# Patient Record
Sex: Female | Born: 1965 | Race: White | Hispanic: No | Marital: Married | Smoking: Never smoker
Health system: Southern US, Community
[De-identification: ages and names within clinical notes are randomized; demographics above are authoritative.]

## PROBLEM LIST (undated history)

## (undated) ENCOUNTER — Ambulatory Visit (HOSPITAL_COMMUNITY): Admission: EM | Payer: BC Managed Care – PPO | Source: Home / Self Care

## (undated) DIAGNOSIS — F419 Anxiety disorder, unspecified: Secondary | ICD-10-CM

## (undated) DIAGNOSIS — M7542 Impingement syndrome of left shoulder: Secondary | ICD-10-CM

## (undated) DIAGNOSIS — K2970 Gastritis, unspecified, without bleeding: Secondary | ICD-10-CM

## (undated) DIAGNOSIS — IMO0002 Reserved for concepts with insufficient information to code with codable children: Secondary | ICD-10-CM

## (undated) DIAGNOSIS — M542 Cervicalgia: Secondary | ICD-10-CM

## (undated) DIAGNOSIS — L900 Lichen sclerosus et atrophicus: Secondary | ICD-10-CM

## (undated) DIAGNOSIS — T7421XA Adult sexual abuse, confirmed, initial encounter: Secondary | ICD-10-CM

## (undated) DIAGNOSIS — J45909 Unspecified asthma, uncomplicated: Secondary | ICD-10-CM

## (undated) DIAGNOSIS — Z8659 Personal history of other mental and behavioral disorders: Secondary | ICD-10-CM

## (undated) DIAGNOSIS — G8929 Other chronic pain: Secondary | ICD-10-CM

## (undated) DIAGNOSIS — R87619 Unspecified abnormal cytological findings in specimens from cervix uteri: Secondary | ICD-10-CM

## (undated) DIAGNOSIS — K589 Irritable bowel syndrome without diarrhea: Secondary | ICD-10-CM

## (undated) DIAGNOSIS — M25522 Pain in left elbow: Secondary | ICD-10-CM

## (undated) DIAGNOSIS — N3010 Interstitial cystitis (chronic) without hematuria: Secondary | ICD-10-CM

## (undated) DIAGNOSIS — S42309A Unspecified fracture of shaft of humerus, unspecified arm, initial encounter for closed fracture: Secondary | ICD-10-CM

## (undated) DIAGNOSIS — F319 Bipolar disorder, unspecified: Secondary | ICD-10-CM

## (undated) DIAGNOSIS — G35 Multiple sclerosis: Secondary | ICD-10-CM

## (undated) DIAGNOSIS — F4310 Post-traumatic stress disorder, unspecified: Secondary | ICD-10-CM

## (undated) DIAGNOSIS — B009 Herpesviral infection, unspecified: Secondary | ICD-10-CM

## (undated) DIAGNOSIS — Z9152 Personal history of nonsuicidal self-harm: Secondary | ICD-10-CM

## (undated) DIAGNOSIS — A630 Anogenital (venereal) warts: Secondary | ICD-10-CM

## (undated) DIAGNOSIS — G2581 Restless legs syndrome: Secondary | ICD-10-CM

## (undated) DIAGNOSIS — M797 Fibromyalgia: Secondary | ICD-10-CM

## (undated) DIAGNOSIS — M5442 Lumbago with sciatica, left side: Secondary | ICD-10-CM

## (undated) HISTORY — DX: Multiple sclerosis: G35

## (undated) HISTORY — DX: Unspecified asthma, uncomplicated: J45.909

## (undated) HISTORY — DX: Anogenital (venereal) warts: A63.0

## (undated) HISTORY — DX: Gastritis, unspecified, without bleeding: K29.70

## (undated) HISTORY — DX: Lumbago with sciatica, left side: M54.42

## (undated) HISTORY — PX: ENDOMETRIAL ABLATION: SHX621

## (undated) HISTORY — DX: Other chronic pain: G89.29

## (undated) HISTORY — DX: Irritable bowel syndrome, unspecified: K58.9

## (undated) HISTORY — DX: Restless legs syndrome: G25.81

## (undated) HISTORY — DX: Lichen sclerosus et atrophicus: L90.0

## (undated) HISTORY — DX: Post-traumatic stress disorder, unspecified: F43.10

## (undated) HISTORY — PX: NASAL SINUS SURGERY: SHX719

## (undated) HISTORY — DX: Impingement syndrome of left shoulder: M75.42

## (undated) HISTORY — DX: Unspecified fracture of shaft of humerus, unspecified arm, initial encounter for closed fracture: S42.309A

## (undated) HISTORY — DX: Adult sexual abuse, confirmed, initial encounter: T74.21XA

## (undated) HISTORY — DX: Personal history of nonsuicidal self-harm: Z91.52

## (undated) HISTORY — DX: Pain in left elbow: M25.522

## (undated) HISTORY — DX: Bipolar disorder, unspecified: F31.9

---
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
## 1987-05-17 DIAGNOSIS — B009 Herpesviral infection, unspecified: Secondary | ICD-10-CM

## 1987-05-17 HISTORY — DX: Herpesviral infection, unspecified: B00.9

## 1988-05-16 HISTORY — PX: BREAST ENHANCEMENT SURGERY: SHX7

## 1995-05-17 DIAGNOSIS — R87619 Unspecified abnormal cytological findings in specimens from cervix uteri: Secondary | ICD-10-CM

## 1995-05-17 HISTORY — DX: Unspecified abnormal cytological findings in specimens from cervix uteri: R87.619

## 1995-05-17 HISTORY — PX: CERVIX LESION DESTRUCTION: SHX591

## 1997-11-11 ENCOUNTER — Other Ambulatory Visit: Admission: RE | Admit: 1997-11-11 | Discharge: 1997-11-11 | Payer: Self-pay | Admitting: Obstetrics and Gynecology

## 1998-05-06 ENCOUNTER — Ambulatory Visit (HOSPITAL_COMMUNITY): Admission: RE | Admit: 1998-05-06 | Discharge: 1998-05-06 | Payer: Self-pay | Admitting: Gynecology

## 1999-01-26 ENCOUNTER — Ambulatory Visit (HOSPITAL_COMMUNITY): Admission: RE | Admit: 1999-01-26 | Discharge: 1999-01-26 | Payer: Self-pay | Admitting: Gastroenterology

## 1999-07-07 ENCOUNTER — Encounter: Payer: Self-pay | Admitting: Rheumatology

## 1999-07-07 ENCOUNTER — Encounter: Admission: RE | Admit: 1999-07-07 | Discharge: 1999-07-07 | Payer: Self-pay | Admitting: Rheumatology

## 1999-07-28 ENCOUNTER — Other Ambulatory Visit: Admission: RE | Admit: 1999-07-28 | Discharge: 1999-07-28 | Payer: Self-pay | Admitting: Gynecology

## 2000-03-01 ENCOUNTER — Encounter (INDEPENDENT_AMBULATORY_CARE_PROVIDER_SITE_OTHER): Payer: Self-pay | Admitting: Specialist

## 2000-03-01 ENCOUNTER — Other Ambulatory Visit: Admission: RE | Admit: 2000-03-01 | Discharge: 2000-03-01 | Payer: Self-pay | Admitting: *Deleted

## 2000-07-20 ENCOUNTER — Other Ambulatory Visit: Admission: RE | Admit: 2000-07-20 | Discharge: 2000-07-20 | Payer: Self-pay | Admitting: Gynecology

## 2001-03-23 ENCOUNTER — Ambulatory Visit (HOSPITAL_COMMUNITY): Admission: RE | Admit: 2001-03-23 | Discharge: 2001-03-23 | Payer: Self-pay | Admitting: Gastroenterology

## 2001-10-18 ENCOUNTER — Other Ambulatory Visit: Admission: RE | Admit: 2001-10-18 | Discharge: 2001-10-18 | Payer: Self-pay | Admitting: Gynecology

## 2002-07-01 ENCOUNTER — Encounter: Payer: Self-pay | Admitting: Psychiatry

## 2002-07-01 ENCOUNTER — Ambulatory Visit (HOSPITAL_COMMUNITY): Admission: RE | Admit: 2002-07-01 | Discharge: 2002-07-01 | Payer: Self-pay | Admitting: Psychiatry

## 2002-10-30 ENCOUNTER — Inpatient Hospital Stay (HOSPITAL_COMMUNITY): Admission: EM | Admit: 2002-10-30 | Discharge: 2002-11-04 | Payer: Self-pay | Admitting: Psychiatry

## 2003-01-06 ENCOUNTER — Other Ambulatory Visit (HOSPITAL_COMMUNITY): Admission: RE | Admit: 2003-01-06 | Discharge: 2003-01-14 | Payer: Self-pay | Admitting: Psychiatry

## 2003-10-20 ENCOUNTER — Other Ambulatory Visit: Admission: RE | Admit: 2003-10-20 | Discharge: 2003-10-20 | Payer: Self-pay | Admitting: Gynecology

## 2004-07-19 ENCOUNTER — Ambulatory Visit (HOSPITAL_COMMUNITY): Admission: RE | Admit: 2004-07-19 | Discharge: 2004-07-19 | Payer: Self-pay | Admitting: Neurology

## 2004-12-02 ENCOUNTER — Emergency Department (HOSPITAL_COMMUNITY): Admission: EM | Admit: 2004-12-02 | Discharge: 2004-12-02 | Payer: Self-pay | Admitting: Family Medicine

## 2005-02-07 ENCOUNTER — Other Ambulatory Visit: Admission: RE | Admit: 2005-02-07 | Discharge: 2005-02-07 | Payer: Self-pay | Admitting: Obstetrics and Gynecology

## 2005-04-21 ENCOUNTER — Ambulatory Visit: Payer: Self-pay | Admitting: Psychiatry

## 2005-04-21 ENCOUNTER — Other Ambulatory Visit (HOSPITAL_COMMUNITY): Admission: RE | Admit: 2005-04-21 | Discharge: 2005-07-20 | Payer: Self-pay | Admitting: Psychiatry

## 2005-06-23 ENCOUNTER — Ambulatory Visit (HOSPITAL_COMMUNITY): Admission: RE | Admit: 2005-06-23 | Discharge: 2005-06-23 | Payer: Self-pay | Admitting: *Deleted

## 2005-06-23 ENCOUNTER — Encounter (INDEPENDENT_AMBULATORY_CARE_PROVIDER_SITE_OTHER): Payer: Self-pay | Admitting: Specialist

## 2005-07-05 ENCOUNTER — Ambulatory Visit (HOSPITAL_BASED_OUTPATIENT_CLINIC_OR_DEPARTMENT_OTHER): Admission: RE | Admit: 2005-07-05 | Discharge: 2005-07-05 | Payer: Self-pay | Admitting: Urology

## 2005-11-29 ENCOUNTER — Ambulatory Visit (HOSPITAL_COMMUNITY): Admission: RE | Admit: 2005-11-29 | Discharge: 2005-11-29 | Payer: Self-pay | Admitting: *Deleted

## 2005-12-01 ENCOUNTER — Emergency Department (HOSPITAL_COMMUNITY): Admission: EM | Admit: 2005-12-01 | Discharge: 2005-12-02 | Payer: Self-pay | Admitting: Emergency Medicine

## 2006-02-09 ENCOUNTER — Other Ambulatory Visit: Admission: RE | Admit: 2006-02-09 | Discharge: 2006-02-09 | Payer: Self-pay | Admitting: Obstetrics & Gynecology

## 2006-02-20 ENCOUNTER — Emergency Department (HOSPITAL_COMMUNITY): Admission: EM | Admit: 2006-02-20 | Discharge: 2006-02-20 | Payer: Self-pay | Admitting: Emergency Medicine

## 2006-09-20 ENCOUNTER — Encounter: Admission: RE | Admit: 2006-09-20 | Discharge: 2006-09-20 | Payer: Self-pay | Admitting: Obstetrics and Gynecology

## 2007-02-26 ENCOUNTER — Emergency Department (HOSPITAL_COMMUNITY): Admission: EM | Admit: 2007-02-26 | Discharge: 2007-02-26 | Payer: Self-pay | Admitting: Family Medicine

## 2007-03-22 ENCOUNTER — Other Ambulatory Visit: Admission: RE | Admit: 2007-03-22 | Discharge: 2007-03-22 | Payer: Self-pay | Admitting: Obstetrics and Gynecology

## 2007-05-08 ENCOUNTER — Other Ambulatory Visit: Admission: RE | Admit: 2007-05-08 | Discharge: 2007-05-08 | Payer: Self-pay | Admitting: Obstetrics and Gynecology

## 2007-07-01 ENCOUNTER — Emergency Department (HOSPITAL_COMMUNITY): Admission: EM | Admit: 2007-07-01 | Discharge: 2007-07-01 | Payer: Self-pay | Admitting: Emergency Medicine

## 2007-08-28 ENCOUNTER — Emergency Department (HOSPITAL_COMMUNITY): Admission: EM | Admit: 2007-08-28 | Discharge: 2007-08-29 | Payer: Self-pay | Admitting: Emergency Medicine

## 2007-09-24 ENCOUNTER — Encounter: Admission: RE | Admit: 2007-09-24 | Discharge: 2007-09-24 | Payer: Self-pay | Admitting: Obstetrics & Gynecology

## 2007-11-22 ENCOUNTER — Encounter: Admission: RE | Admit: 2007-11-22 | Discharge: 2007-11-22 | Payer: Self-pay | Admitting: Obstetrics and Gynecology

## 2008-01-25 ENCOUNTER — Telehealth: Payer: Self-pay | Admitting: Gastroenterology

## 2008-05-12 ENCOUNTER — Other Ambulatory Visit: Admission: RE | Admit: 2008-05-12 | Discharge: 2008-05-12 | Payer: Self-pay | Admitting: Obstetrics and Gynecology

## 2008-09-02 ENCOUNTER — Other Ambulatory Visit: Admission: RE | Admit: 2008-09-02 | Discharge: 2008-09-02 | Payer: Self-pay | Admitting: Obstetrics and Gynecology

## 2009-01-13 ENCOUNTER — Encounter: Admission: RE | Admit: 2009-01-13 | Discharge: 2009-01-13 | Payer: Self-pay | Admitting: Obstetrics & Gynecology

## 2009-04-02 ENCOUNTER — Emergency Department (HOSPITAL_COMMUNITY): Admission: EM | Admit: 2009-04-02 | Discharge: 2009-04-02 | Payer: Self-pay | Admitting: Emergency Medicine

## 2009-04-14 ENCOUNTER — Emergency Department (HOSPITAL_COMMUNITY): Admission: EM | Admit: 2009-04-14 | Discharge: 2009-04-14 | Payer: Self-pay | Admitting: Emergency Medicine

## 2009-09-13 DIAGNOSIS — IMO0002 Reserved for concepts with insufficient information to code with codable children: Secondary | ICD-10-CM

## 2009-09-13 HISTORY — DX: Reserved for concepts with insufficient information to code with codable children: IMO0002

## 2010-01-15 ENCOUNTER — Encounter: Admission: RE | Admit: 2010-01-15 | Discharge: 2010-01-15 | Payer: Self-pay | Admitting: Obstetrics & Gynecology

## 2010-02-05 ENCOUNTER — Emergency Department (HOSPITAL_COMMUNITY): Admission: EM | Admit: 2010-02-05 | Discharge: 2010-02-05 | Payer: Self-pay | Admitting: Emergency Medicine

## 2010-05-26 ENCOUNTER — Ambulatory Visit
Admission: RE | Admit: 2010-05-26 | Discharge: 2010-05-26 | Payer: Self-pay | Source: Home / Self Care | Attending: Urology | Admitting: Urology

## 2010-05-31 LAB — POCT HEMOGLOBIN-HEMACUE: Hemoglobin: 13.4 g/dL (ref 12.0–15.0)

## 2010-07-29 LAB — URINALYSIS, ROUTINE W REFLEX MICROSCOPIC
Bilirubin Urine: NEGATIVE
Glucose, UA: NEGATIVE mg/dL
Hgb urine dipstick: NEGATIVE
Ketones, ur: NEGATIVE mg/dL
Nitrite: NEGATIVE
Protein, ur: NEGATIVE mg/dL
Specific Gravity, Urine: 1.002 — ABNORMAL LOW (ref 1.005–1.030)
Urobilinogen, UA: 0.2 mg/dL (ref 0.0–1.0)
pH: 7 (ref 5.0–8.0)

## 2010-07-29 LAB — POCT CARDIAC MARKERS
CKMB, poc: <1 ng/mL — ABNORMAL LOW (ref 1.0–8.0)
Myoglobin, poc: 39.6 ng/mL (ref 12–200)
Troponin i, poc: <0.05 ng/mL (ref 0.00–0.09)

## 2010-07-29 LAB — POCT I-STAT, CHEM 8
BUN: <3 mg/dL — ABNORMAL LOW (ref 6–23)
Calcium, Ion: 1.18 mmol/L (ref 1.12–1.32)
Chloride: 101 mEq/L (ref 96–112)
Creatinine, Ser: 0.7 mg/dL (ref 0.4–1.2)
Glucose, Bld: 92 mg/dL (ref 70–99)
HCT: 42 % (ref 36.0–46.0)
Hemoglobin: 14.3 g/dL (ref 12.0–15.0)
Potassium: 3.4 meq/L — ABNORMAL LOW (ref 3.5–5.1)
Sodium: 140 meq/L (ref 135–145)
TCO2: 27 mmol/L (ref 0–100)

## 2010-08-18 LAB — URINE MICROSCOPIC-ADD ON

## 2010-08-18 LAB — LIPASE, BLOOD: Lipase: 29 U/L (ref 11–59)

## 2010-08-18 LAB — COMPREHENSIVE METABOLIC PANEL
ALT: 17 U/L (ref 0–35)
Albumin: 3.8 g/dL (ref 3.5–5.2)
Alkaline Phosphatase: 49 U/L (ref 39–117)
BUN: 10 mg/dL (ref 6–23)
CO2: 31 mEq/L (ref 19–32)
Calcium: 9.3 mg/dL (ref 8.4–10.5)
Creatinine, Ser: 0.63 mg/dL (ref 0.4–1.2)
GFR calc Af Amer: >60 mL/min (ref >60)
GFR calc non Af Amer: >60 mL/min (ref >60)
Glucose, Bld: 90 mg/dL (ref 70–99)
Potassium: 4.3 mEq/L (ref 3.5–5.1)
Sodium: 137 mEq/L (ref 135–145)
Total Protein: 6.1 g/dL (ref 6.0–8.3)
Total Protein: 6.8 g/dL (ref 6.0–8.3)

## 2010-08-18 LAB — URINALYSIS, ROUTINE W REFLEX MICROSCOPIC
Nitrite: NEGATIVE
Specific Gravity, Urine: 1.028 (ref 1.005–1.030)
pH: 6.5 (ref 5.0–8.0)

## 2010-08-18 LAB — POCT CARDIAC MARKERS: Troponin i, poc: <0.05 ng/mL (ref 0.00–0.09)

## 2010-08-18 LAB — CBC
HCT: 39.1 % (ref 36.0–46.0)
Hemoglobin: 13.4 g/dL (ref 12.0–15.0)
MCHC: 34.1 g/dL (ref 30.0–36.0)
MCV: 92.7 fL (ref 78.0–100.0)
Platelets: 155 10*3/uL (ref 150–400)
RBC: 4.22 MIL/uL (ref 3.87–5.11)
RDW: 12.8 % (ref 11.5–15.5)
RDW: 13 % (ref 11.5–15.5)

## 2010-08-18 LAB — POCT I-STAT, CHEM 8
Calcium, Ion: 1.08 mmol/L — ABNORMAL LOW (ref 1.12–1.32)
Chloride: 96 mEq/L (ref 96–112)
Creatinine, Ser: 0.7 mg/dL (ref 0.4–1.2)
Glucose, Bld: 84 mg/dL (ref 70–99)
HCT: 40 % (ref 36.0–46.0)

## 2010-08-18 LAB — URINE CULTURE: Culture: NO GROWTH

## 2010-08-18 LAB — DIFFERENTIAL
Basophils Relative: 0 % (ref 0–1)
Eosinophils Absolute: 0 10*3/uL (ref 0.0–0.7)
Lymphs Abs: 1.7 10*3/uL (ref 0.7–4.0)
Monocytes Absolute: 0.7 10*3/uL (ref 0.1–1.0)
Monocytes Relative: 5 % (ref 3–12)
Monocytes Relative: 6 % (ref 3–12)
Neutro Abs: 12.5 10*3/uL — ABNORMAL HIGH (ref 1.7–7.7)
Neutro Abs: 4 10*3/uL (ref 1.7–7.7)
Neutrophils Relative %: 65 % (ref 43–77)

## 2010-08-18 LAB — PROTIME-INR
INR: 0.89 (ref 0.00–1.49)
Prothrombin Time: 12 seconds (ref 11.6–15.2)

## 2010-08-18 LAB — GLUCOSE, CAPILLARY: Glucose-Capillary: 70 mg/dL (ref 70–99)

## 2010-09-21 ENCOUNTER — Emergency Department (HOSPITAL_COMMUNITY)
Admission: EM | Admit: 2010-09-21 | Discharge: 2010-09-21 | Disposition: A | Discharge status: Home / Self Care | Payer: BC Managed Care – PPO | Attending: Emergency Medicine | Admitting: Emergency Medicine

## 2010-09-21 DIAGNOSIS — S81009A Unspecified open wound, unspecified knee, initial encounter: Secondary | ICD-10-CM | POA: Insufficient documentation

## 2010-09-21 DIAGNOSIS — F341 Dysthymic disorder: Secondary | ICD-10-CM | POA: Insufficient documentation

## 2010-09-21 DIAGNOSIS — S91009A Unspecified open wound, unspecified ankle, initial encounter: Secondary | ICD-10-CM | POA: Insufficient documentation

## 2010-09-21 DIAGNOSIS — X789XXA Intentional self-harm by unspecified sharp object, initial encounter: Secondary | ICD-10-CM | POA: Insufficient documentation

## 2010-09-21 DIAGNOSIS — G35 Multiple sclerosis: Secondary | ICD-10-CM | POA: Insufficient documentation

## 2010-10-01 NOTE — Op Note (Signed)
NAME:  Erika Mccoy, Erika Mccoy NO.:  [id number]   MEDICAL RECORD NO.:  [id number]          PATIENT TYPE:  AMB   LOCATION:  ENDO                         FACILITY:  [facility]   PHYSICIAN:  Georgiana Spinner, M.D.    DATE OF BIRTH:  [birth year]   DATE OF PROCEDURE:  06/23/2005  DATE OF DISCHARGE:                                 OPERATIVE REPORT   PROCEDURE:  Colonoscopy.   INDICATIONS:  Diarrhea. Colon cancer screening.  Hemoccult positivity   PROCEDURE:  With the patient mildly sedated in the left lateral decubitus  position, a rectal examination was performed which revealed trace positive  material. Subsequently the Olympus videoscopic colonoscope was inserted in  the rectum, passed under direct vision to the cecum identified by ileocecal  valve and appendiceal orifice both of which were photographed. We entered  into the terminal ileum which was also photographed. From this point  colonoscope was slowly withdrawn taking circumferential views of colonic  mucosa stopping first in the terminal ileum and subsequently randomly in the  colon to take biopsies until we reached the rectum which appeared normal on  direct and retroflexed view.  The endoscope was straightened and withdrawn.  The patient's vital signs, pulse oximeter remained stable. The patient  tolerated procedure well without apparent complication.   FINDINGS:  Negative examination. Await biopsy report. The patient will call  me for results and follow-up with me as an outpatient.           ______________________________  Georgiana Spinner, M.D.     GMO/MEDQ  D:  06/23/2005  T:  06/23/2005  Job:  [id number]

## 2010-10-01 NOTE — Op Note (Signed)
NAME:  LANA, FLAIM NO.:  [id number]   MEDICAL RECORD NO.:  [id number]          PATIENT TYPE:  AMB   LOCATION:  ENDO                         FACILITY:  [facility]   PHYSICIAN:  Georgiana Spinner, M.D.    DATE OF BIRTH:  01/25/1966   DATE OF PROCEDURE:  06/23/2005  DATE OF DISCHARGE:                                 OPERATIVE REPORT   PROCEDURE:  Upper endoscopy with biopsy.   INDICATIONS:  Diarrhea, abdominal discomfort.   ANESTHESIA:  Demerol 125 mg, Robinul 0.2 mg, Phenergan 25 mg and Versed 12.5  mg.   PROCEDURE:  With the patient mildly sedated in the left lateral decubitus  position, the Olympus videoscopic endoscope was inserted in the mouth and  passed under direct vision through the esophagus which appeared normal into  the stomach.  Fundus, body, antrum appeared normal.  We passed the duodenal  bulb, second portion duodenum and distally which was biopsied.  From this  point, the endoscope was slowly withdrawn taking circumferential views of  duodenal mucosa until the endoscope had been pulled back in the stomach,  placed in retroflexion to view the stomach from below.  The endoscope was  straightened and withdrawn taking circumferential views of the remaining  gastric and esophageal mucosa. The patient's vital signs and pulse remained  stable. The patient tolerated procedure well without apparent complications.   FINDINGS:  Rather unremarkable examination with biopsies taken. Await biopsy  report. The patient will call me for results and follow-up with me as an  outpatient.  Proceed to colonoscopy as planned.           ______________________________  Georgiana Spinner, M.D.     GMO/MEDQ  D:  06/23/2005  T:  06/23/2005  Job:  [id number]

## 2010-10-01 NOTE — Op Note (Signed)
NAME:  Erika Mccoy, DEDIC NO.:  [id number]   MEDICAL RECORD NO.:  [id number]          PATIENT TYPE:  AMB   LOCATION:  ENDO                         FACILITY:  [facility]   PHYSICIAN:  Georgiana Spinner, M.D.    DATE OF BIRTH:  21-Oct-1965   DATE OF PROCEDURE:  11/29/2005  DATE OF DISCHARGE:                                 OPERATIVE REPORT   PROCEDURE:  Colonoscopy.   INDICATIONS:  Rectal bleeding.  The patient with question of colitis.   ANESTHESIA:  Fentanyl 125 mcg, Versed 12 mg, Phenergan 25 mg.   PROCEDURE:  With patient mildly sedated in the left lateral decubitus  position, the Olympus videoscopic colonoscope was inserted into the rectum  and passed under direct vision.  With pressure applied to the abdomen, we  reached the cecum identified by ileocecal valve and appendiceal orifice,  both of which were photographed.  From this point, the colonoscope was  slowly withdrawn taking circumferential views of colonic mucosa stopping  only in the rectum which appeared normal in direct view.  I attempted  retroflexed view, but it was uncomfortable for the patient, and since we had  done her previously recently, I decided not to proceed.  The patient's vital  signs and pulse oximeter remained stable.  The patient tolerated the  procedure well and without apparent complication.   FINDINGS:  Unremarkable examination.   PLAN:  Have patient follow-up with me as an outpatient.           ______________________________  Georgiana Spinner, M.D.     GMO/MEDQ  D:  11/29/2005  T:  11/29/2005  Job:  [id number]

## 2010-10-01 NOTE — Op Note (Signed)
NAME:  YURITZI, KAMP            ACCOUNT NO.:  [id number]   MEDICAL RECORD NO.:  [id number]          PATIENT TYPE:  AMB   LOCATION:  [facility]                         FACILITY:  [facility]   PHYSICIAN:  Martina Sinner, MD DATE OF BIRTH:  January 10, 1966   DATE OF PROCEDURE:  07/05/2005  DATE OF DISCHARGE:                                 OPERATIVE REPORT   PREOPERATIVE DIAGNOSIS:  Pelvic pain.   POSTOPERATIVE DIAGNOSES:  Pelvic pain: interstitial cystitis.   PROCEDURE:  Cystoscopy, bladder hydrodistention and bladder installation  therapy.   Loreli Debruler has chronic pelvic pain as described in her history and  physical. She has multiple allergies. She had some nonspecific  gastroenterology complaints. She had been on Elmiron for 6 months.   The patient was prepped and draped in the usual fashion. A 21 a [place] [facility]  cystoscope we used for the examination. The initial inspection of the  bladder mucosa and trigone were normal. She was hydrodistended 800 mL. There  was no stitch, foreign body or carcinoma. I emptied her bladder and  reinspected and she had diffuse glomerulations throughout her entire  bladder, especially at 5 and 7 o'clock. There was quite a bit of oozing of  blood and this settled down with some gentle irrigation through the  cystoscope sheath. There was no Hunner's ulcer. There was no bladder injury  or bladder perforation.   I then instilled 15 mL of 0.5% Marcaine in combination with 400 mg of  Pyridium. A B&O suppository was also inserted. The patient will be followed  as per protocol. Because of her multiple allergies, I did not give her any  more antibiotics for home use.           ______________________________  Martina Sinner, MD  Electronically Signed     SAM/MEDQ  D:  07/05/2005  T:  07/06/2005  Job:  [id number]

## 2010-11-11 ENCOUNTER — Emergency Department (HOSPITAL_COMMUNITY)
Admission: EM | Admit: 2010-11-11 | Discharge: 2010-11-11 | Disposition: A | Discharge status: Home / Self Care | Payer: BC Managed Care – PPO | Attending: Emergency Medicine | Admitting: Emergency Medicine

## 2010-11-11 ENCOUNTER — Emergency Department (HOSPITAL_COMMUNITY)
Admission: EM | Admit: 2010-11-11 | Discharge: 2010-11-11 | Disposition: A | Discharge status: Home / Self Care | Payer: BC Managed Care – PPO | Source: Home / Self Care | Attending: Emergency Medicine | Admitting: Emergency Medicine

## 2010-11-11 DIAGNOSIS — S81009A Unspecified open wound, unspecified knee, initial encounter: Secondary | ICD-10-CM | POA: Insufficient documentation

## 2010-11-11 DIAGNOSIS — S81809A Unspecified open wound, unspecified lower leg, initial encounter: Secondary | ICD-10-CM | POA: Insufficient documentation

## 2010-11-11 DIAGNOSIS — X789XXA Intentional self-harm by unspecified sharp object, initial encounter: Secondary | ICD-10-CM | POA: Insufficient documentation

## 2010-11-12 DIAGNOSIS — M542 Cervicalgia: Secondary | ICD-10-CM

## 2010-11-12 HISTORY — DX: Cervicalgia: M54.2

## 2010-11-18 ENCOUNTER — Ambulatory Visit
Admission: RE | Admit: 2010-11-18 | Discharge: 2010-11-18 | Disposition: A | Discharge status: Home / Self Care | Payer: BC Managed Care – PPO | Source: Ambulatory Visit | Attending: Orthopedic Surgery | Admitting: Orthopedic Surgery

## 2010-11-18 ENCOUNTER — Other Ambulatory Visit: Payer: Self-pay | Admitting: Orthopedic Surgery

## 2010-11-18 DIAGNOSIS — R52 Pain, unspecified: Secondary | ICD-10-CM

## 2010-12-09 ENCOUNTER — Other Ambulatory Visit: Payer: Self-pay | Admitting: Obstetrics & Gynecology

## 2010-12-09 DIAGNOSIS — F319 Bipolar disorder, unspecified: Secondary | ICD-10-CM | POA: Insufficient documentation

## 2010-12-09 DIAGNOSIS — M797 Fibromyalgia: Secondary | ICD-10-CM | POA: Insufficient documentation

## 2010-12-09 DIAGNOSIS — F411 Generalized anxiety disorder: Secondary | ICD-10-CM | POA: Insufficient documentation

## 2010-12-09 DIAGNOSIS — F449 Dissociative and conversion disorder, unspecified: Secondary | ICD-10-CM | POA: Insufficient documentation

## 2010-12-09 DIAGNOSIS — F4310 Post-traumatic stress disorder, unspecified: Secondary | ICD-10-CM | POA: Insufficient documentation

## 2010-12-09 DIAGNOSIS — Z1231 Encounter for screening mammogram for malignant neoplasm of breast: Secondary | ICD-10-CM

## 2010-12-21 DIAGNOSIS — L709 Acne, unspecified: Secondary | ICD-10-CM | POA: Insufficient documentation

## 2011-01-18 ENCOUNTER — Ambulatory Visit: Payer: BC Managed Care – PPO

## 2011-01-28 ENCOUNTER — Emergency Department (HOSPITAL_COMMUNITY)
Admission: EM | Admit: 2011-01-28 | Discharge: 2011-01-28 | Disposition: A | Discharge status: Home / Self Care | Payer: BC Managed Care – PPO | Attending: Emergency Medicine | Admitting: Emergency Medicine

## 2011-01-28 DIAGNOSIS — S81809A Unspecified open wound, unspecified lower leg, initial encounter: Secondary | ICD-10-CM | POA: Insufficient documentation

## 2011-01-28 DIAGNOSIS — X789XXA Intentional self-harm by unspecified sharp object, initial encounter: Secondary | ICD-10-CM | POA: Insufficient documentation

## 2011-01-28 DIAGNOSIS — S81009A Unspecified open wound, unspecified knee, initial encounter: Secondary | ICD-10-CM | POA: Insufficient documentation

## 2011-01-31 ENCOUNTER — Emergency Department (HOSPITAL_COMMUNITY)
Admission: EM | Admit: 2011-01-31 | Discharge: 2011-01-31 | Discharge status: Against Medical Advice | Payer: BC Managed Care – PPO | Attending: Emergency Medicine | Admitting: Emergency Medicine

## 2011-02-04 LAB — URINE MICROSCOPIC-ADD ON

## 2011-02-04 LAB — URINALYSIS, ROUTINE W REFLEX MICROSCOPIC
Glucose, UA: NEGATIVE
Hgb urine dipstick: NEGATIVE
Ketones, ur: NEGATIVE
Protein, ur: NEGATIVE
Urobilinogen, UA: 0.2

## 2011-02-23 ENCOUNTER — Ambulatory Visit
Admission: RE | Admit: 2011-02-23 | Discharge: 2011-02-23 | Disposition: A | Discharge status: Home / Self Care | Payer: BC Managed Care – PPO | Source: Ambulatory Visit | Attending: Obstetrics & Gynecology | Admitting: Obstetrics & Gynecology

## 2011-02-23 DIAGNOSIS — Z1231 Encounter for screening mammogram for malignant neoplasm of breast: Secondary | ICD-10-CM

## 2011-02-28 ENCOUNTER — Emergency Department (HOSPITAL_COMMUNITY)
Admission: EM | Admit: 2011-02-28 | Discharge: 2011-02-28 | Disposition: A | Discharge status: Home / Self Care | Payer: BC Managed Care – PPO | Attending: Emergency Medicine | Admitting: Emergency Medicine

## 2011-02-28 DIAGNOSIS — M25579 Pain in unspecified ankle and joints of unspecified foot: Secondary | ICD-10-CM | POA: Insufficient documentation

## 2011-02-28 DIAGNOSIS — M79609 Pain in unspecified limb: Secondary | ICD-10-CM | POA: Insufficient documentation

## 2011-03-12 ENCOUNTER — Emergency Department (HOSPITAL_COMMUNITY)
Admission: EM | Admit: 2011-03-12 | Discharge: 2011-03-13 | Disposition: A | Discharge status: Other Acute Inpatient Hospital | Payer: BC Managed Care – PPO | Attending: Emergency Medicine | Admitting: Emergency Medicine

## 2011-03-12 DIAGNOSIS — F3289 Other specified depressive episodes: Secondary | ICD-10-CM | POA: Insufficient documentation

## 2011-03-12 DIAGNOSIS — IMO0001 Reserved for inherently not codable concepts without codable children: Secondary | ICD-10-CM | POA: Insufficient documentation

## 2011-03-12 DIAGNOSIS — X789XXA Intentional self-harm by unspecified sharp object, initial encounter: Secondary | ICD-10-CM | POA: Insufficient documentation

## 2011-03-12 DIAGNOSIS — S50909A Unspecified superficial injury of unspecified elbow, initial encounter: Secondary | ICD-10-CM | POA: Insufficient documentation

## 2011-03-12 DIAGNOSIS — Z79899 Other long term (current) drug therapy: Secondary | ICD-10-CM | POA: Insufficient documentation

## 2011-03-12 DIAGNOSIS — F329 Major depressive disorder, single episode, unspecified: Secondary | ICD-10-CM | POA: Insufficient documentation

## 2011-03-12 DIAGNOSIS — R45851 Suicidal ideations: Secondary | ICD-10-CM | POA: Insufficient documentation

## 2011-03-12 LAB — COMPREHENSIVE METABOLIC PANEL
ALT: 12 U/L (ref 0–35)
AST: 15 U/L (ref 0–37)
CO2: 28 mEq/L (ref 19–32)
Calcium: 10 mg/dL (ref 8.4–10.5)
Chloride: 97 mEq/L (ref 96–112)
Creatinine, Ser: 0.69 mg/dL (ref 0.50–1.10)
GFR calc Af Amer: >90 mL/min (ref >90)
GFR calc non Af Amer: >90 mL/min (ref >90)
Glucose, Bld: 83 mg/dL (ref 70–99)
Total Bilirubin: 0.7 mg/dL (ref 0.3–1.2)

## 2011-03-12 LAB — DIFFERENTIAL
Eosinophils Absolute: 0.1 10*3/uL (ref 0.0–0.7)
Lymphocytes Relative: 42 % (ref 12–46)
Lymphs Abs: 2.4 10*3/uL (ref 0.7–4.0)
Monocytes Relative: 4 % (ref 3–12)
Neutrophils Relative %: 52 % (ref 43–77)

## 2011-03-12 LAB — CBC
HCT: 40.2 % (ref 36.0–46.0)
Hemoglobin: 13.8 g/dL (ref 12.0–15.0)
MCH: 30.2 pg (ref 26.0–34.0)
MCV: 88 fL (ref 78.0–100.0)
RBC: 4.57 MIL/uL (ref 3.87–5.11)
WBC: 5.7 10*3/uL (ref 4.0–10.5)

## 2011-03-12 LAB — ETHANOL: Alcohol, Ethyl (B): 121 mg/dL — ABNORMAL HIGH (ref 0–11)

## 2011-03-12 LAB — RAPID URINE DRUG SCREEN, HOSP PERFORMED
Amphetamines: NOT DETECTED
Benzodiazepines: NOT DETECTED
Opiates: NOT DETECTED

## 2011-03-13 ENCOUNTER — Inpatient Hospital Stay (HOSPITAL_COMMUNITY)
Admission: EM | Admit: 2011-03-13 | Discharge: 2011-03-15 | DRG: 430 | Disposition: A | Discharge status: Home / Self Care | Payer: BC Managed Care – PPO | Source: Other Acute Inpatient Hospital | Attending: Psychiatry | Admitting: Psychiatry

## 2011-03-13 DIAGNOSIS — S61509A Unspecified open wound of unspecified wrist, initial encounter: Secondary | ICD-10-CM

## 2011-03-13 DIAGNOSIS — Z79899 Other long term (current) drug therapy: Secondary | ICD-10-CM

## 2011-03-13 DIAGNOSIS — N3010 Interstitial cystitis (chronic) without hematuria: Secondary | ICD-10-CM

## 2011-03-13 DIAGNOSIS — X838XXA Intentional self-harm by other specified means, initial encounter: Secondary | ICD-10-CM

## 2011-03-13 DIAGNOSIS — F411 Generalized anxiety disorder: Secondary | ICD-10-CM

## 2011-03-13 DIAGNOSIS — F1010 Alcohol abuse, uncomplicated: Secondary | ICD-10-CM

## 2011-03-13 DIAGNOSIS — IMO0001 Reserved for inherently not codable concepts without codable children: Secondary | ICD-10-CM

## 2011-03-13 DIAGNOSIS — Z560 Unemployment, unspecified: Secondary | ICD-10-CM

## 2011-03-13 DIAGNOSIS — F339 Major depressive disorder, recurrent, unspecified: Principal | ICD-10-CM

## 2011-03-13 DIAGNOSIS — K589 Irritable bowel syndrome without diarrhea: Secondary | ICD-10-CM

## 2011-03-13 DIAGNOSIS — Z818 Family history of other mental and behavioral disorders: Secondary | ICD-10-CM

## 2011-03-13 LAB — HEPATIC FUNCTION PANEL
Bilirubin, Direct: 0.1 mg/dL (ref 0.0–0.3)
Total Bilirubin: 0.7 mg/dL (ref 0.3–1.2)

## 2011-03-14 DIAGNOSIS — F411 Generalized anxiety disorder: Secondary | ICD-10-CM

## 2011-03-14 DIAGNOSIS — F339 Major depressive disorder, recurrent, unspecified: Secondary | ICD-10-CM

## 2011-03-14 LAB — URINALYSIS, ROUTINE W REFLEX MICROSCOPIC
Ketones, ur: NEGATIVE mg/dL
Leukocytes, UA: NEGATIVE
Nitrite: NEGATIVE
Protein, ur: NEGATIVE mg/dL
Urobilinogen, UA: 0.2 mg/dL (ref 0.0–1.0)

## 2011-03-14 LAB — TSH: TSH: 3.76 u[IU]/mL (ref 0.350–4.500)
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
## 2011-03-17 NOTE — Discharge Summary (Signed)
  NAME:  Erika Mccoy, Erika Mccoy NO.:  [id number]  MEDICAL RECORD NO.:  [id number]  LOCATION:  0504                          FACILITY:  [facility]  PHYSICIAN:  Franchot Gallo, MD     DATE OF BIRTH:  10/21/1965  DATE OF ADMISSION:  03/14/2011 DATE OF DISCHARGE:  03/15/2011                              DISCHARGE SUMMARY   REASON FOR ADMISSION:  Patient presented with a history of depression and anxiety, getting very frustrated, having some problems with her twin sons.  She had a self-inflicted laceration and was having passive suicidal thoughts that if she did not get rid of this feeling, she did not know what would happen.  FINAL IMPRESSION:  Axis I:  Major depressive disorder, recurrent; generalized anxiety disorder. Axis II:  Deferred. Axis III:  History of fibromyalgia, irritable bowel syndrome, interstitial cystitis. Axis IV:  Issues with primary support system, marital separation. Axis V:  Global Assessment of Functioning at discharge is 70.  PERTINENT LABS:  Her urine drug screen was negative.  Alcohol level was at 121 on admission.  CMP was within normal limits.  The patient did have a superficial laceration to her left wrist.  SIGNIFICANT FINDINGS:  The patient was admitted to the adult milieu for safety and stabilization.  We continued with her home meds, but we did add Effexor to help with her depression and anxiety.  She was reporting she was beginning to feel better and on day of discharge, the patient was reporting fair sleep; good appetite; depression had resolved, rating it a 0 on a scale of 1-10; adamantly denying any suicidal or homicidal thoughts, auditory or visual hallucinations or delusional thinking.  Her anxiety was mild, rating it a 2 on a scale of 1-10, having no medication- related side effects.  Counselor did contact her mother who had no safety concerns, and we did provide information on suicide risk factors, prevention and  intervention.  DISCHARGE MEDICATIONS: 1. Klonopin 1 mg b.i.d. p.r.n. 2. Benadryl 25 mg q.h.s. 3. Perphenazine 4 mg taking 8 mg daily. 4. Effexor XR 75 mg 1 daily. 5. Lamictal 200 mg daily. 6. Trazodone 50 mg taking 4 tablets at bedtime.  FOLLOWUP APPOINTMENT:  With Dr. Donell Beers at [facility] at phone number [phone], and [name] at [facility] at phone number [phone].     Landry Corporal, N.P.   ______________________________ Franchot Gallo, MD    JO/MEDQ  D:  03/16/2011  T:  03/16/2011  Job:  [id number]  Electronically Signed by Limmie PatriciaP. on 03/17/2011 09:42:09 AM Electronically Signed by Franchot Gallo MD on 03/17/2011 03:55:39 PM

## 2011-03-30 DIAGNOSIS — K589 Irritable bowel syndrome without diarrhea: Secondary | ICD-10-CM | POA: Insufficient documentation

## 2011-03-30 DIAGNOSIS — K5909 Other constipation: Secondary | ICD-10-CM | POA: Insufficient documentation

## 2011-08-22 DIAGNOSIS — N895 Stricture and atresia of vagina: Secondary | ICD-10-CM | POA: Insufficient documentation

## 2011-09-28 DIAGNOSIS — J302 Other seasonal allergic rhinitis: Secondary | ICD-10-CM | POA: Insufficient documentation

## 2011-10-24 ENCOUNTER — Other Ambulatory Visit: Payer: Self-pay | Admitting: Family Medicine

## 2011-10-24 DIAGNOSIS — Z1231 Encounter for screening mammogram for malignant neoplasm of breast: Secondary | ICD-10-CM

## 2011-10-25 DIAGNOSIS — E781 Pure hyperglyceridemia: Secondary | ICD-10-CM | POA: Insufficient documentation

## 2012-04-22 ENCOUNTER — Encounter (HOSPITAL_COMMUNITY): Payer: Self-pay | Admitting: Emergency Medicine

## 2012-04-22 ENCOUNTER — Emergency Department (HOSPITAL_COMMUNITY)
Admission: EM | Admit: 2012-04-22 | Discharge: 2012-04-23 | Disposition: A | Discharge status: Home Health | Payer: BC Managed Care – PPO | Attending: Emergency Medicine | Admitting: Emergency Medicine

## 2012-04-22 DIAGNOSIS — Z3202 Encounter for pregnancy test, result negative: Secondary | ICD-10-CM | POA: Insufficient documentation

## 2012-04-22 DIAGNOSIS — F411 Generalized anxiety disorder: Secondary | ICD-10-CM | POA: Insufficient documentation

## 2012-04-22 DIAGNOSIS — IMO0001 Reserved for inherently not codable concepts without codable children: Secondary | ICD-10-CM | POA: Insufficient documentation

## 2012-04-22 DIAGNOSIS — R109 Unspecified abdominal pain: Secondary | ICD-10-CM | POA: Insufficient documentation

## 2012-04-22 DIAGNOSIS — N3010 Interstitial cystitis (chronic) without hematuria: Secondary | ICD-10-CM | POA: Insufficient documentation

## 2012-04-22 DIAGNOSIS — Z79899 Other long term (current) drug therapy: Secondary | ICD-10-CM | POA: Insufficient documentation

## 2012-04-22 DIAGNOSIS — L940 Localized scleroderma [morphea]: Secondary | ICD-10-CM | POA: Insufficient documentation

## 2012-04-22 DIAGNOSIS — N390 Urinary tract infection, site not specified: Secondary | ICD-10-CM

## 2012-04-22 HISTORY — DX: Fibromyalgia: M79.7

## 2012-04-22 HISTORY — DX: Interstitial cystitis (chronic) without hematuria: N30.10

## 2012-04-22 HISTORY — DX: Anxiety disorder, unspecified: F41.9

## 2012-04-22 LAB — URINALYSIS, ROUTINE W REFLEX MICROSCOPIC
Bilirubin Urine: NEGATIVE
Ketones, ur: NEGATIVE mg/dL
Nitrite: POSITIVE — AB
Protein, ur: NEGATIVE mg/dL
Urobilinogen, UA: 0.2 mg/dL (ref 0.0–1.0)

## 2012-04-22 LAB — URINE MICROSCOPIC-ADD ON

## 2012-04-22 LAB — POCT PREGNANCY, URINE: Preg Test, Ur: NEGATIVE

## 2012-04-22 MED ORDER — HYDROMORPHONE HCL PF 1 MG/ML IJ SOLN
1.0000 mg | Freq: Once | INTRAMUSCULAR | Status: AC
  Administered 2012-04-22: 1 mg via INTRAMUSCULAR
  Filled 2012-04-22: qty 1

## 2012-04-22 MED ORDER — ONDANSETRON 4 MG PO TBDP
8.0000 mg | ORAL_TABLET | Freq: Once | ORAL | Status: AC
  Administered 2012-04-22: 8 mg via ORAL
  Filled 2012-04-22: qty 2

## 2012-04-22 NOTE — ED Notes (Addendum)
meds given, pt up to b/r to give clean catch urine sample. Steady gait, alert, NAD, calm, interactive.

## 2012-04-22 NOTE — ED Notes (Signed)
Patient with interstitial cystitis, increasing in pain during the day.  Patient has been self cathing bladder, but pain has gotten out of control today.  Patient did take one vicodin with no relief.  Pain is in lower back, vaginal pain.

## 2012-04-22 NOTE — ED Provider Notes (Signed)
History     [name]: [id number]  Arrival date & time 04/22/12  2045   First MD Initiated Contact with Patient 04/22/12 2224      Chief Complaint  Patient presents with  . IC pain    HPI  History provided by the patient. Patient is a 46 year old female with history of anxiety, depression, fibromyalgia and interstitial cystitis who presents with complaints of uncontrolled pain from interstitial cystitis, "my IC pains". Patient states that she saw her urologist, Dr. Jacquelyne Balint on Friday for increasing lower pelvic and urethral burning and irritation this consistent with her IC. She was given a Marcaine injection in the urethra as well as in and out catheter treatments. Patient states she also has a prescription for vaginal suppository Valium to help with spasming. Patient was using this over the weekend as well as Vicodin which she occasionally uses for severe pains without any improvement. Patient states that she rarely has such severe exacerbations as she is having today. Patient states she is afraid to urinate and symptoms are significantly worse with any urination. She reports being tested for a UTI but Dr. Jacquelyne Balint at this time there was no signs of infection and she is not currently on any antibiotics. Patient denies any other complaints. She does denies any vaginal complaints currently. Denies any bleeding or vaginal discharge. She denies any fever, chills or sweats. She has slight nausea but no change in appetite. No episodes of vomiting. No diarrhea.    Past Medical History  Diagnosis Date  . Interstitial cystitis   . Fibromyalgia   . Anxiety   . Lichen sclerosus of female genitalia     History reviewed. No pertinent past surgical history.  History reviewed. No pertinent family history.  History  Substance Use Topics  . Smoking status: Never Smoker   . Smokeless tobacco: Not on file  . Alcohol Use: No    OB History    Grav Para Term Preterm Abortions TAB SAB Ect Mult Living                   Review of Systems  Constitutional: Negative for fever, chills and diaphoresis.  Gastrointestinal: Positive for nausea and abdominal pain. Negative for vomiting, diarrhea and constipation.  Genitourinary: Positive for dysuria and pelvic pain. Negative for frequency, hematuria, flank pain, vaginal bleeding and vaginal discharge.  All other systems reviewed and are negative.    Allergies  Cefzil; Ciprofloxacin; Levofloxacin; Neosporin; Quinolones; and Septra  Home Medications   Current Outpatient Rx  Name  Route  Sig  Dispense  Refill  . BUPIVACAINE HCL 0.5 % IJ SOLN   Infiltration   30 mLs by Infiltration route 2 (two) times daily.         . CHOLECALCIFEROL 1000 UNITS PO TABS   Oral   Take 1,000 Units by mouth daily.         Marland Kitchen CLOBETASOL PROPIONATE 0.05 % EX OINT   Topical   Apply 1 application topically 2 (two) times daily.         Marland Kitchen CLONAZEPAM 2 MG PO TABS   Oral   Take 1-2 mg by mouth 2 (two) times daily. 2mg  in the morning and 1mg  at night         . HYDROCODONE-ACETAMINOPHEN 5-325 MG PO TABS   Oral   Take 1 tablet by mouth every 6 (six) hours as needed. For pain         . MOMETASONE FUROATE 50 MCG/ACT NA SUSP  Nasal   Place 2 sprays into the nose daily.         Marland Kitchen MONTELUKAST SODIUM 10 MG PO TABS   Oral   Take 10 mg by mouth at bedtime.         . OMEPRAZOLE 20 MG PO CPDR   Oral   Take 20 mg by mouth daily.         Marland Kitchen PRESCRIPTION MEDICATION   Vaginal   Place 1 suppository vaginally at bedtime. Diazepam Vaginal Suppository         . TRAZODONE HCL 300 MG PO TABS   Oral   Take 300 mg by mouth at bedtime.         . VENLAFAXINE HCL ER 75 MG PO CP24   Oral   Take 75 mg by mouth daily.           BP 111/86  Pulse 98  Temp 97.9 F (36.6 C) (Oral)  Resp 17  SpO2 97%  LMP 03/23/2012  Physical Exam  Nursing note and vitals reviewed. Constitutional: She is oriented to person, place, and time. She appears  well-developed and well-nourished. No distress.  HENT:  Head: Normocephalic.  Cardiovascular: Normal rate and regular rhythm.   Pulmonary/Chest: Effort normal and breath sounds normal. No respiratory distress. She has no wheezes. She has no rales.  Abdominal: Soft. There is no hepatosplenomegaly. There is tenderness in the suprapubic area. There is no rebound, no guarding, no CVA tenderness, no tenderness at McBurney's point and negative Murphy's sign.  Genitourinary:       Exam was refused by patient  Neurological: She is alert and oriented to person, place, and time.  Skin: Skin is warm and dry.  Psychiatric: She has a normal mood and affect. Her behavior is normal.    ED Course  Procedures   Results for orders placed during the hospital encounter of 04/22/12  URINALYSIS, ROUTINE W REFLEX MICROSCOPIC      Component Value Range   Color, Urine YELLOW  YELLOW   APPearance CLOUDY (*) CLEAR   Specific Gravity, Urine 1.024  1.005 - 1.030   pH 6.0  5.0 - 8.0   Glucose, UA NEGATIVE  NEGATIVE mg/dL   Hgb urine dipstick SMALL (*) NEGATIVE   Bilirubin Urine NEGATIVE  NEGATIVE   Ketones, ur NEGATIVE  NEGATIVE mg/dL   Protein, ur NEGATIVE  NEGATIVE mg/dL   Urobilinogen, UA 0.2  0.0 - 1.0 mg/dL   Nitrite POSITIVE (*) NEGATIVE   Leukocytes, UA LARGE (*) NEGATIVE  POCT PREGNANCY, URINE      Component Value Range   Preg Test, Ur NEGATIVE  NEGATIVE  URINE MICROSCOPIC-ADD ON      Component Value Range   Squamous Epithelial / LPF FEW (*) RARE   WBC, UA TOO NUMEROUS TO COUNT  <3 WBC/hpf   RBC / HPF 7-10  <3 RBC/hpf   Bacteria, UA MANY (*) RARE        1. UTI (lower urinary tract infection)       MDM  10:25 PM patient seen and evaluated. Patient appears in mild discomfort but no significant or acute distress.  Patient denies having any vaginal symptoms. She refuses a pelvic exam. States she was seen by her OB/GYN in the past few months. She denies being sexually active  currently.    Patient feeling much better after pain medications. UA was signs of UTI. At this time we'll give pressures for Macrobid. Patient feels ready to be discharged home.  Angus Seller, PA 04/23/12 0020

## 2012-04-23 MED ORDER — NITROFURANTOIN MONOHYD MACRO 100 MG PO CAPS: 100.0000 mg | ORAL_CAPSULE | Freq: Two times a day (BID) | ORAL | Status: DC

## 2012-04-23 MED ORDER — OXYCODONE-ACETAMINOPHEN 5-325 MG PO TABS: 1.0000 | ORAL_TABLET | Freq: Four times a day (QID) | ORAL | Status: DC | PRN

## 2012-04-23 NOTE — ED Provider Notes (Signed)
Medical screening examination/treatment/procedure(s) were performed by non-physician practitioner and as supervising physician I was immediately available for consultation/collaboration.  Doug Sou, MD 04/23/12 [phone]

## 2012-04-25 LAB — URINE CULTURE

## 2012-04-26 NOTE — ED Notes (Signed)
+   urine Patient treated with Macrobid-sensitive to same-chart appended per protocol MD. 

## 2012-05-10 ENCOUNTER — Ambulatory Visit
Admission: RE | Admit: 2012-05-10 | Discharge: 2012-05-10 | Disposition: A | Discharge status: Home / Self Care | Payer: BC Managed Care – PPO | Source: Ambulatory Visit | Attending: Family Medicine | Admitting: Family Medicine

## 2012-05-10 DIAGNOSIS — Z1231 Encounter for screening mammogram for malignant neoplasm of breast: Secondary | ICD-10-CM

## 2012-08-01 ENCOUNTER — Emergency Department (HOSPITAL_COMMUNITY)
Admission: EM | Admit: 2012-08-01 | Discharge: 2012-08-01 | Disposition: A | Discharge status: Home / Self Care | Payer: BC Managed Care – PPO | Attending: Emergency Medicine | Admitting: Emergency Medicine

## 2012-08-01 ENCOUNTER — Emergency Department (HOSPITAL_COMMUNITY): Payer: BC Managed Care – PPO

## 2012-08-01 ENCOUNTER — Encounter (HOSPITAL_COMMUNITY): Payer: Self-pay | Admitting: *Deleted

## 2012-08-01 DIAGNOSIS — Z8742 Personal history of other diseases of the female genital tract: Secondary | ICD-10-CM | POA: Insufficient documentation

## 2012-08-01 DIAGNOSIS — J069 Acute upper respiratory infection, unspecified: Secondary | ICD-10-CM

## 2012-08-01 DIAGNOSIS — IMO0001 Reserved for inherently not codable concepts without codable children: Secondary | ICD-10-CM | POA: Insufficient documentation

## 2012-08-01 DIAGNOSIS — Z8659 Personal history of other mental and behavioral disorders: Secondary | ICD-10-CM | POA: Insufficient documentation

## 2012-08-01 DIAGNOSIS — R0989 Other specified symptoms and signs involving the circulatory and respiratory systems: Secondary | ICD-10-CM | POA: Insufficient documentation

## 2012-08-01 DIAGNOSIS — N3010 Interstitial cystitis (chronic) without hematuria: Secondary | ICD-10-CM | POA: Insufficient documentation

## 2012-08-01 DIAGNOSIS — Z79899 Other long term (current) drug therapy: Secondary | ICD-10-CM | POA: Insufficient documentation

## 2012-08-01 MED ORDER — ALBUTEROL SULFATE HFA 108 (90 BASE) MCG/ACT IN AERS: 1.0000 | INHALATION_SPRAY | RESPIRATORY_TRACT | Status: DC

## 2012-08-01 MED ORDER — ALBUTEROL SULFATE (5 MG/ML) 0.5% IN NEBU
5.0000 mg | INHALATION_SOLUTION | Freq: Once | RESPIRATORY_TRACT | Status: AC
  Administered 2012-08-01: 5 mg via RESPIRATORY_TRACT
  Filled 2012-08-01: qty 1

## 2012-08-01 MED ORDER — ALBUTEROL SULFATE HFA 108 (90 BASE) MCG/ACT IN AERS: 1.0000 | INHALATION_SPRAY | RESPIRATORY_TRACT | Status: DC | PRN

## 2012-08-01 NOTE — ED Notes (Signed)
Pt states 2 days ago started having a "head cold", states yesterday started having a chest tingling, no longer having chest tingling sensation.

## 2012-08-01 NOTE — ED Notes (Signed)
Pt alert and oriented x4. Respirations even and unlabored, bilateral symmetrical rise and fall of chest. Skin warm and dry. In no acute distress. Denies needs.   

## 2012-08-01 NOTE — ED Notes (Signed)
Pt to xray

## 2012-08-01 NOTE — ED Provider Notes (Signed)
History    [name]: [id number] Arrival date & time 08/01/12  1626 First MD Initiated Contact with Patient 08/01/12 1653     Chief Complaint  Patient presents with  . Shortness of Breath  . URI   HPI Patient presents to the emergency room with complaints of chest congestion and shortness of breath.  Patient started having some sinus congestion and a head cold 2 days ago. A family member has recently been ill with a similar illness. Patient states that today she then started having some tingling in her chest and a sensation that she couldn't take a full breath.  She denies history of asthma but states that she has had episodes with allergies where she had to use an inhaler. Denies any chest pain. She has not had any fever. She denies any leg swelling. No history of DVT or PE. Patient also mentions having history of interstitial cystitis and fibromyalgia. She does feel like her fibromyalgia it is flaring up associated with this illness. This is not atypical for her. Past Medical History  Diagnosis Date  . Interstitial cystitis   . Fibromyalgia   . Anxiety   . Lichen sclerosus of female genitalia     History reviewed. No pertinent past surgical history.  History reviewed. No pertinent family history.  History  Substance Use Topics  . Smoking status: Never Smoker   . Smokeless tobacco: Not on file  . Alcohol Use: No    OB History   Grav Para Term Preterm Abortions TAB SAB Ect Mult Living                  Review of Systems  All other systems reviewed and are negative.    Allergies  Cefzil; Ciprofloxacin; Levofloxacin; Neosporin; Quinolones; Septra; and Adhesive  Home Medications   Current Outpatient Rx  Name  Route  Sig  Dispense  Refill  . clonazePAM (KLONOPIN) 2 MG tablet   Oral   Take 1-2 mg by mouth 2 (two) times daily. 2mg  in the morning and 1mg  at night         . omeprazole (PRILOSEC) 20 MG capsule   Oral   Take 20 mg by mouth daily.         . traZODone  (DESYREL) 100 MG tablet   Oral   Take 200 mg by mouth at bedtime.         Marland Kitchen venlafaxine XR (EFFEXOR-XR) 150 MG 24 hr capsule   Oral   Take 150 mg by mouth daily.         . clobetasol ointment (TEMOVATE) 0.05 %   Topical   Apply 1 application topically 2 (two) times daily.           BP 107/74  Pulse 103  Temp(Src) 98.6 F (37 C) (Oral)  Resp 18  SpO2 97%  LMP 07/29/2012  Physical Exam  Nursing note and vitals reviewed. Constitutional: She appears well-developed and well-nourished. No distress.  HENT:  Head: Normocephalic and atraumatic.  Right Ear: External ear normal.  Left Ear: External ear normal.  Mouth/Throat: No oropharyngeal exudate.  Eyes: Conjunctivae are normal. Right eye exhibits no discharge. Left eye exhibits no discharge. No scleral icterus.  Neck: Neck supple. No tracheal deviation present.  Cardiovascular: Normal rate, regular rhythm and intact distal pulses.   Pulmonary/Chest: Effort normal and breath sounds normal. No stridor. No respiratory distress. She has no wheezes. She has no rales.  Able to speak in full sentences, no stridor  Abdominal:  Soft. Bowel sounds are normal. She exhibits no distension. There is no tenderness. There is no rebound and no guarding.  Musculoskeletal: She exhibits no edema and no tenderness.  Neurological: She is alert. She has normal strength. No sensory deficit. Cranial nerve deficit:  no gross defecits noted. She exhibits normal muscle tone. She displays no seizure activity. Coordination normal.  Skin: Skin is warm and dry. No rash noted.  Psychiatric: She has a normal mood and affect.    ED Course  Procedures (including critical care time)  Rate: 92  Rhythm: normal sinus rhythm  QRS Axis: normal  Intervals: normal  ST/T Wave abnormalities: normal  Conduction Disutrbances:none  Narrative Interpretation:   Old EKG Reviewed: none available  Labs Reviewed - No data to display Dg Chest 2 View  08/01/2012   *RADIOLOGY REPORT*  Clinical Data: Shortness of breath.  Upper respiratory infection. Chest congestion.  CHEST - 2 VIEW  Comparison: Portable chest x-ray 04/02/2009 and two-view chest x- ray 02/20/2006.  Findings: Cardiomediastinal silhouette unremarkable, unchanged. Lungs clear.  Bronchovascular markings normal.  Pulmonary vascularity normal.  No pneumothorax.  No pleural effusions. Minimal degenerative changes involving the thoracic spine. Bilateral breast prostheses.  No significant interval change.  IMPRESSION: No acute cardiopulmonary disease.  Stable examination.   Original Report Authenticated By: Hulan Saas, M.D.      1. URI, acute       MDM  Symptoms are likely related to the upper respiratory infection that she has.  She is not wheezing. She is breathing easily.  She does not have any leg swelling, no estrogen meds, no recent imobility.  Doubt PE.  Will dc home with inhaler for symptomatic relief.  Warning signs and reasons to return to the ED discussed.        Celene Kras, MD 08/01/12 [phone]

## 2012-11-21 ENCOUNTER — Telehealth: Payer: Self-pay | Admitting: Obstetrics & Gynecology

## 2012-11-21 MED ORDER — VALACYCLOVIR HCL 500 MG PO TABS: 500.0000 mg | ORAL_TABLET | Freq: Two times a day (BID) | ORAL | Status: DC

## 2012-11-21 NOTE — Telephone Encounter (Signed)
Patient complains of herpes outbreak and requests and rx to [name] [place]. Declined appointment" unless necessary."

## 2012-11-21 NOTE — Telephone Encounter (Signed)
See note below of patient request. Last AEX 10/27/2011 Last notes in paper chart of HSV outbreak and medication ordered are 04/04/2007: 05/08/2007: 12/15/2009. Patient has scheduled appt. In [facility] for July 14th AEX with Dr. Farrel Gobble. Left message on CBVM# of medication Valtrex 500mg   One  PO BID x 3 days. Dispensed 6 with no refill to her [facility]. Left message to be sure and keep AEX appt. On Monday with Dr. Farrel Gobble. sue

## 2012-11-26 ENCOUNTER — Encounter: Payer: Self-pay | Admitting: Obstetrics & Gynecology

## 2012-11-26 ENCOUNTER — Ambulatory Visit: Payer: Self-pay | Admitting: Gynecology

## 2012-11-26 DIAGNOSIS — Z01419 Encounter for gynecological examination (general) (routine) without abnormal findings: Secondary | ICD-10-CM

## 2012-12-03 ENCOUNTER — Encounter: Payer: Self-pay | Admitting: Gynecology

## 2012-12-03 ENCOUNTER — Ambulatory Visit (INDEPENDENT_AMBULATORY_CARE_PROVIDER_SITE_OTHER): Payer: BC Managed Care – PPO | Admitting: Gynecology

## 2012-12-03 VITALS — BP 100/64 | HR 56 | Temp 99.2°F | Ht 64.5 in | Wt 166.0 lb

## 2012-12-03 DIAGNOSIS — K589 Irritable bowel syndrome without diarrhea: Secondary | ICD-10-CM

## 2012-12-03 DIAGNOSIS — Z01419 Encounter for gynecological examination (general) (routine) without abnormal findings: Secondary | ICD-10-CM

## 2012-12-03 DIAGNOSIS — L900 Lichen sclerosus et atrophicus: Secondary | ICD-10-CM | POA: Insufficient documentation

## 2012-12-03 DIAGNOSIS — L940 Localized scleroderma [morphea]: Secondary | ICD-10-CM

## 2012-12-03 DIAGNOSIS — N3010 Interstitial cystitis (chronic) without hematuria: Secondary | ICD-10-CM | POA: Insufficient documentation

## 2012-12-03 MED ORDER — LIDOCAINE 5 % EX OINT: TOPICAL_OINTMENT | Freq: Three times a day (TID) | CUTANEOUS | Status: DC

## 2012-12-03 NOTE — Progress Notes (Addendum)
Patient ID: Erika Mccoy, female   DOB: [birth year], 47 y.o.   MRN: [id number] 47 y.o. Single Caucasian female   G2P2 here for annual exam. Pt is currently sexually active. Pt has lichen sclerosis, had been on temovate but did not go on maintenance after it cleared.  Pt is very uncomfortable now reports having an ulcer between the rectum and vagina.  Reports pain with urination, restarted miralax 5d ago, reports had hemorrhoid but is imrpoving.  Pt reports recent increase in stress level, symptoms restarted about 1 week ago.  Pt reports last coitus 2-3 weeks ago, last encounter was painful, but in general they are not.  Patient's last menstrual period was 11/13/2012.          Sexually active: yes  The current method of family planning is none.    Exercising: yes  Walking Last pap:  10/26/11, normal, neg HRHPV Alcohol: less than 1-2 drinks per week Tobacco: none BSE: monthly Mammogram:  12/13 BiRADS 1    Health Maintenance  Topic Date Due  . Influenza Vaccine  01/14/2013  . Pap Smear  10/26/2014  . Tetanus/tdap  10/11/2020    History reviewed. No pertinent family history.  There are no active problems to display for this patient.   Past Medical History  Diagnosis Date  . Interstitial cystitis   . Fibromyalgia   . Anxiety   . Lichen sclerosus of female genitalia   . Manic depression   . HSV-2 infection     rare occurence  . MS (multiple sclerosis)   . IBS (irritable bowel syndrome)   . History of self mutilation   . Arm sprain 5/11    right   . Lichen sclerosus     Vulva    Past Surgical History  Procedure Laterality Date  . Endometrial ablation  1997/1998  . Breast enhancement surgery  1990    Allergies: Cefzil; Ciprofloxacin; Levofloxacin; Monistat; Neosporin; Quinolones; Septra; and Adhesive  Current Outpatient Prescriptions  Medication Sig Dispense Refill  . clobetasol ointment (TEMOVATE) 0.05 % Apply 1 application topically 2 (two) times daily.      .  clonazePAM (KLONOPIN) 2 MG tablet Take 1-2 mg by mouth 2 (two) times daily. 2mg  in the morning and 1mg  at night      . DiphenhydrAMINE HCl (BENADRYL PO) Take by mouth as needed.      Marland Kitchen omeprazole (PRILOSEC) 20 MG capsule Take 20 mg by mouth daily.      . traZODone (DESYREL) 100 MG tablet Take 200 mg by mouth at bedtime.      Marland Kitchen venlafaxine XR (EFFEXOR-XR) 75 MG 24 hr capsule Take 225 mg by mouth daily.      . Multiple Vitamins-Minerals (MULTIVITAMIN PO) Take by mouth.      . valACYclovir (VALTREX) 500 MG tablet Take 1 tablet (500 mg total) by mouth 2 (two) times daily. X 3 days.  6 tablet  0   No current facility-administered medications for this visit.    ROS: Pertinent items are noted in HPI.  Exam:    BP 100/64  Pulse 56  Temp(Src) 99.2 F (37.3 C) (Oral)  Ht 5' 4.5" (1.638 m)  Wt 166 lb (75.297 kg)  BMI 28.06 kg/m2  LMP 11/13/2012 Weight change: @WEIGHTCHANGE @ Last 3 height recordings:  Ht Readings from Last 3 Encounters:  12/03/12 5' 4.5" (1.638 m)   General appearance: alert, cooperative and appears stated age Head: Normocephalic, without obvious abnormality, atraumatic Neck: no adenopathy, no carotid bruit, no  JVD, supple, symmetrical, trachea midline and thyroid not enlarged, symmetric, no tenderness/mass/nodules Lungs: clear to auscultation bilaterally Breasts: normal appearance, no masses or tenderness, s/p reduction Heart: regular rate and rhythm, S1, S2 normal, no murmur, click, rub or gallop Abdomen: soft, non-tender; bowel sounds normal; no masses,  no organomegaly Extremities: extremities normal, atraumatic, no cyanosis or edema Skin: Skin color, texture, turgor normal. No rashes or lesions Lymph nodes: Cervical, supraclavicular, and axillary nodes normal. no inguinal nodes palpated Neurologic: Grossly normal   Pelvic: External genitalia:  See picture              Urethra: normal appearing urethra with no masses, tenderness or lesions              Bartholins  and Skenes: normal                 Vagina: normal appearing vagina with normal color and discharge, no lesions              Cervix: normal appearance              Pap taken: no        Bimanual Exam:  Uterus:  uterus is normal size, shape, consistency and nontender                                      Adnexa:    normal adnexa in size, nontender and no masses                                      Rectovaginal: Deferred                                      Anus:  defer exam       A: well woman Lichen sclerosis flare Interstitial cystitis irritable bowel     P: mammogram 12/14 pap smear guidelines reviewed LS- see instructions, pt had started to treat before office visit today, should continue as directed with additional instructions, briefly discussed maintenance or trial of neurontin  F/u 2w return annually or prn   An After Visit Summary was printed and given to the patient.

## 2012-12-03 NOTE — Addendum Note (Signed)
Addended by: Douglass Rivers on: 12/03/2012 12:31 PM   Modules accepted: Level of Service

## 2012-12-03 NOTE — Patient Instructions (Signed)
Lichen Sclerosus Lichen sclerosus is a skin problem. It can happen on any part of the body, but it commonly involves the anal or genital areas. Lichen sclerosus is not an infection or a fungus. Girls and women are more commonly affected than boys and men. CAUSES The cause is not known. It could be the result of an overactive immune system or a lack of certain hormones. Lichen sclerosus is not passed from one person to another (not contagious). SYMPTOMS Your skin may have:  Thin, wrinkled, white areas.  Thickened white areas.  Red and swollen patches.  Tears or cracks.  Bruising.  Blood blisters.  Severe itching. You may also have pain, itching, or burning with urination. Constipation is also common in people with lichen sclerosus.  Urinate in sitz bath, use sitz bath after bowel movements, continue miralax. Clobetasol twice a day Xylocaine jelly as needed for discomfort Avoid wash cloths, towels and toilet paper- blow dry on cool setting DIAGNOSIS Your caregiver will do a physical exam. Sometimes, a tissue sample (biopsy) may be sent for testing. TREATMENT Treatment may involve putting a thin layer of medicated cream (topical steroid) over the areas with lichen sclerosus. Use the cream only as directed by your caregiver.  HOME CARE INSTRUCTIONS  Only take over-the-counter or prescription medicines as directed by your caregiver.  Keep the vaginal area as clean and dry as possible. SEEK MEDICAL CARE IF: You develop increasing pain, swelling, or redness. Document Released: 09/22/2010 Document Revised: 07/25/2011 Document Reviewed: 09/22/2010 [facility] Patient Information 2014 [place], [place].

## 2012-12-04 ENCOUNTER — Telehealth: Payer: Self-pay | Admitting: Gynecology

## 2012-12-04 NOTE — Telephone Encounter (Signed)
Patient notified of Dr. Farrel Gobble information from Dr. Farrel Gobble of pharmacy [facility] , pharmacist Ed, will be able to compound the lidocaine 5 % ointment to use topically 3 x day. Patient appreciates this and will pick up Rx tomorrow when notified is ready.

## 2012-12-04 NOTE — Telephone Encounter (Signed)
Patient concerned of Rx she got yesterday from her [facility] of Lidocaine ointment as ordered per Dr. Farrel Gobble does contain peppermint oil. Pharmacist explained that is all their store has in stock. Called other [facility] stores and same response as if it does not come any other way. All Lidocaine 5% ointment contain peppermint oil. Please advise patient.  Patient also was asking if her pap smear was done and if not why?

## 2012-12-04 NOTE — Telephone Encounter (Signed)
Left message @ CB#VM to call our office for information concerning lidocaine .

## 2012-12-04 NOTE — Telephone Encounter (Signed)
Patient says someone called her today and she missed the call. No record of call .

## 2012-12-04 NOTE — Telephone Encounter (Signed)
Patient did not have a pap smear yesterday, is this correct? Patient was given a prescription for lidocaine ointment and was told not to use if it had a mint scent. Patient says this ointment smells of peppermint. Patient is asking if she should use this ointment of get a different ointment. Patient says the pharmacist says this peppermint scented lidocaine is the only kind they carry in their store.

## 2012-12-11 NOTE — Telephone Encounter (Signed)
Spoke with patient who has been using the Lidocaine ointment 5 % with out the peppermint oil base. Did good until today when she put on it has caused severe burning. patient states she has washed it off with soap and water and now has cool,ice , towel to vaginal area.  Thinks now she is allergic to this also. States she uses marcaine injection to her bladder due to her IC and self caths herself and marcaine has never had a reaction to it.  Please advise.

## 2012-12-11 NOTE — Telephone Encounter (Signed)
Ov please

## 2012-12-11 NOTE — Telephone Encounter (Signed)
Patient states she is having an allergic reaction to lidocaine she is using genitally. Please advise?

## 2012-12-11 NOTE — Telephone Encounter (Signed)
Patient notified of need to come to office in the morning. July 31st, 9:45am for Dr. Farrel Gobble.

## 2012-12-12 ENCOUNTER — Ambulatory Visit (INDEPENDENT_AMBULATORY_CARE_PROVIDER_SITE_OTHER): Payer: BC Managed Care – PPO | Admitting: Gynecology

## 2012-12-12 ENCOUNTER — Encounter: Payer: Self-pay | Admitting: Gynecology

## 2012-12-12 VITALS — BP 100/64 | HR 84 | Ht 64.5 in | Wt 162.0 lb

## 2012-12-12 DIAGNOSIS — L900 Lichen sclerosus et atrophicus: Secondary | ICD-10-CM

## 2012-12-12 DIAGNOSIS — L940 Localized scleroderma [morphea]: Secondary | ICD-10-CM

## 2012-12-12 NOTE — Progress Notes (Signed)
Subjective:     Patient ID: Erika Mccoy, female   DOB: [birth year], 47 y.o.   MRN: [id number]  HPI Comments: Pt here complaining of burning from the lidocaine ointment that we had compounded for her.  Pt does use marcaine to self cath for her IC and has not had a problem.  Pt is using temovate and other recommendations as discussed previously.    Review of Systems  Genitourinary: Negative for urgency, vaginal bleeding, vaginal discharge and pelvic pain.  All other systems reviewed and are negative.       Objective:   Physical Exam  Constitutional: She is oriented to person, place, and time. She appears well-developed and well-nourished.  Genitourinary:     Neurological: She is alert and oriented to person, place, and time.       Assessment:     Lichen sclerosis Questionable allergy to lidocaine      Plan:     Pt shown areas of concern, recommend she continue temovate BID to affected areas especially near healed laceration, f/u 2w Due to her use of anesthetics for her IC we placed a small amount of the lidocaine ointment on her inner wrist-no rash but area felt warm simialr to vulvar issues A small amount of jelly placed on opposite wrist-no issues, suspect she may have a reaction to the ointment base but should still be aware when she uses the marcaine, she is agreeable. She no longer needs the anesthetic

## 2012-12-17 ENCOUNTER — Ambulatory Visit: Payer: BC Managed Care – PPO | Admitting: Gynecology

## 2012-12-27 ENCOUNTER — Ambulatory Visit: Payer: BC Managed Care – PPO | Admitting: Gynecology

## 2012-12-31 ENCOUNTER — Ambulatory Visit: Payer: BC Managed Care – PPO | Admitting: Gynecology

## 2012-12-31 ENCOUNTER — Telehealth: Payer: Self-pay | Admitting: Gynecology

## 2012-12-31 NOTE — Telephone Encounter (Signed)
Patient dnka'ed  this appointment . Do you want to charge a no show fee?

## 2013-01-10 NOTE — Telephone Encounter (Signed)
Would like to come back in for a recheck with Dr Farrel Gobble

## 2013-01-10 NOTE — Telephone Encounter (Signed)
Return call to patient to reschedule missed appointment. LMTCB.

## 2013-01-11 ENCOUNTER — Telehealth: Payer: Self-pay | Admitting: Gynecology

## 2013-01-11 NOTE — Telephone Encounter (Signed)
Spoke with pt who needs to sched follow up appt with TL to check healing of laceration. Sched appt 01-18-13 at 2:15. Pt also reports she has been having cold spells and feeling like she has no energy for the past 2 weeks. No fever. No pain in perineal area where laceration was. Pt wonders if it could be her thyroid. Advised pt to call PCP or go to urgent care over the weekend if needed. Pt agreeable.

## 2013-01-11 NOTE — Telephone Encounter (Signed)
See phone note from 12-31-12.

## 2013-01-11 NOTE — Telephone Encounter (Signed)
Patient is calling in C/O she keeps getting cold, feels sluggish , foggy, No fever. No energy what so ever.

## 2013-01-18 ENCOUNTER — Ambulatory Visit (INDEPENDENT_AMBULATORY_CARE_PROVIDER_SITE_OTHER): Payer: BC Managed Care – PPO | Admitting: Gynecology

## 2013-01-18 ENCOUNTER — Encounter: Payer: Self-pay | Admitting: Gynecology

## 2013-01-18 VITALS — BP 90/60 | HR 68 | Resp 16 | Ht 64.5 in | Wt 160.0 lb

## 2013-01-18 DIAGNOSIS — N930 Postcoital and contact bleeding: Secondary | ICD-10-CM | POA: Insufficient documentation

## 2013-01-18 DIAGNOSIS — L900 Lichen sclerosus et atrophicus: Secondary | ICD-10-CM

## 2013-01-18 DIAGNOSIS — L940 Localized scleroderma [morphea]: Secondary | ICD-10-CM

## 2013-01-18 MED ORDER — CLOBETASOL PROPIONATE 0.05 % EX OINT: TOPICAL_OINTMENT | CUTANEOUS | Status: DC

## 2013-01-18 NOTE — Progress Notes (Signed)
Subjective:     Patient ID: Erika Mccoy, female   DOB: 05/01/1966, 47 y.o.   MRN: [id number]  HPI Comments: Pt here for follow up of lichen sclerosis.  Pt is feeling a lot better than previous, using temovate twice a day and is using the miralax and bowel movements now better.  Pt has been sexually active and reports mild discomfort the next morning that lasted 3-4d Pt reports post-ciotal bleeding that appears old, lasts for a few days-brown Pt reports that overall she is not feeling well, pt reports that she is in the process of being diagnosed with a polymyalia but she is no longer felt to have MS.  Reports extreme fatigue and heaviness of upper extremities that lasted for 2days last week, associated with altered mental status-foggy-lasted same 2d.  Pt no longer seeing Dr Kellie Simmering.    Review of Systems  All other systems reviewed and are negative.       Objective:   Physical Exam  Nursing note and vitals reviewed. Constitutional: She is oriented to person, place, and time. She appears well-developed and well-nourished.  Genitourinary: Vagina normal and uterus normal.    There is no rash, tenderness, lesion or injury on the right labia. There is no rash, tenderness, lesion or injury on the left labia. Cervix exhibits no motion tenderness and no discharge. Right adnexum displays no mass. Left adnexum displays no mass. No erythema around the vagina. No vaginal discharge found.  Neurological: She is alert and oriented to person, place, and time.       Assessment:     Lichen sclerosis Post-coital bleed      Plan:     Decrease temovate to once a day Protect the posterior perineum with EVOO or cocoanut oil Post-coital bleeding after endometrial ablation in 97, menses resumed 1999

## 2013-01-18 NOTE — Patient Instructions (Signed)
Decrease temovate to once a day Protect the posterior perineum with EVOO or cocoanut oil Recommend neurology consult at [facility] or [facility]

## 2013-01-22 ENCOUNTER — Other Ambulatory Visit: Payer: BC Managed Care – PPO

## 2013-01-22 ENCOUNTER — Other Ambulatory Visit: Payer: BC Managed Care – PPO | Admitting: Gynecology

## 2013-02-01 NOTE — Telephone Encounter (Signed)
Spoke with patient. She is requesting a referral states she has been more fatigued. Doesn't know if she needs neurology, wants to go to [facility] if she can.  Patient wants to send many thanks to you, Dr. Farrel Gobble!

## 2013-02-01 NOTE — Telephone Encounter (Signed)
Patient was calling about getting the referral that Farrel Gobble wanted to do for her to send her to [facility] for her Autoimmune diseases. On her vacation she slept for two days straight and never woke up. She didn't know what she needed to do for the next step to getting an appt.

## 2013-02-05 ENCOUNTER — Ambulatory Visit (INDEPENDENT_AMBULATORY_CARE_PROVIDER_SITE_OTHER): Payer: BC Managed Care – PPO | Admitting: Gynecology

## 2013-02-05 ENCOUNTER — Encounter: Payer: Self-pay | Admitting: Gynecology

## 2013-02-05 ENCOUNTER — Ambulatory Visit (INDEPENDENT_AMBULATORY_CARE_PROVIDER_SITE_OTHER): Payer: BC Managed Care – PPO

## 2013-02-05 VITALS — BP 110/62 | HR 78 | Resp 14 | Ht 64.5 in | Wt 165.0 lb

## 2013-02-05 DIAGNOSIS — N930 Postcoital and contact bleeding: Secondary | ICD-10-CM

## 2013-02-05 NOTE — Progress Notes (Signed)
     Pt here for u/s to evaluate for post-coital bleeding.  Pt had resumption of menses after ablation since 2006, 1d light flow-red to brown.   U/s shows lining c/w ablation, no free fluid, no adnexal masses, uterus normal otherwise Pt assured, suspect bleeding is related in incomplete expression of lining with flow.  Pt would like referral to rheumatology for symptoms that mimic MS but has been ruled out, will try to help with referral

## 2013-02-08 NOTE — Telephone Encounter (Signed)
Dr Farrel Gobble, who are we sending her to?

## 2013-02-09 NOTE — Telephone Encounter (Signed)
Working on it, the person i referred her to it turns out she already had seen in the past, think we ware changing specialties

## 2013-02-15 ENCOUNTER — Telehealth: Payer: Self-pay | Admitting: Gynecology

## 2013-02-15 NOTE — Telephone Encounter (Signed)
Patient has a knot in her right armpit about the size of a nickel. She is concerned with it. And wants to make an appt.

## 2013-02-15 NOTE — Telephone Encounter (Signed)
LMTCB for appt.  aa 

## 2013-02-18 ENCOUNTER — Encounter: Payer: Self-pay | Admitting: Gynecology

## 2013-02-18 ENCOUNTER — Ambulatory Visit (INDEPENDENT_AMBULATORY_CARE_PROVIDER_SITE_OTHER): Payer: BC Managed Care – PPO | Admitting: Gynecology

## 2013-02-18 VITALS — BP 114/76 | HR 70 | Resp 14 | Ht 64.5 in | Wt 162.0 lb

## 2013-02-18 DIAGNOSIS — N63 Unspecified lump in unspecified breast: Secondary | ICD-10-CM

## 2013-02-18 NOTE — Telephone Encounter (Signed)
Spoke with patient. Appointment scheduled for today at 5:15 with Dr. Farrel Gobble.

## 2013-03-12 DIAGNOSIS — R768 Other specified abnormal immunological findings in serum: Secondary | ICD-10-CM | POA: Insufficient documentation

## 2013-03-21 ENCOUNTER — Other Ambulatory Visit: Payer: Self-pay

## 2013-03-22 ENCOUNTER — Encounter: Payer: Self-pay | Admitting: Gynecology

## 2013-03-22 NOTE — Progress Notes (Signed)
Subjective:     Patient ID: Erika Mccoy, female   DOB: 20-Mar-1966, 47 y.o.   MRN: [id number]  HPI Comments: Pt her for questionable breast mass that she felt high in her axilla on the right.  Pt does not do regular breast exams. She is due for her menses. She is overdue for her mammogram.  Pt has been having a lot of issues with her neurological situation and is frustrated, she is no longer felt to have MS.  Pt has tried a dietary change and feels like that might be working for her.    Review of Systems  Constitutional: Negative for fever and chills.  HENT: Negative for sinus pressure, sneezing, sore throat and trouble swallowing.        Objective:   Physical Exam  Constitutional: She appears well-developed and well-nourished.  Pulmonary/Chest: Right breast exhibits no inverted nipple, no mass, no nipple discharge, no skin change and no tenderness. Left breast exhibits no inverted nipple, no mass, no nipple discharge, no skin change and no tenderness.         Assessment:     Axillary fullness     Plan:     Repeat exam in 6w, may be extra breast tissue swollen at time of menses, pt is agreeable

## 2013-04-03 ENCOUNTER — Encounter: Payer: Self-pay | Admitting: Gynecology

## 2013-04-03 ENCOUNTER — Telehealth: Payer: Self-pay | Admitting: Gynecology

## 2013-04-03 ENCOUNTER — Ambulatory Visit: Payer: BC Managed Care – PPO | Admitting: Gynecology

## 2013-04-03 NOTE — Telephone Encounter (Signed)
Patient dnka 6 week reck appointment with Dr.Lathrop. I was unable to reach patient by phone, letter mailed.

## 2013-04-15 MED ORDER — PROMETHAZINE HCL 12.5 MG PO TABS: 12.5000 mg | ORAL_TABLET | Freq: Four times a day (QID) | ORAL | Status: DC | PRN

## 2013-04-15 NOTE — Telephone Encounter (Signed)
Spoke with patient. States that she has been having a herpes outbreak that she noticed on Friday 11/28. She called Gyn On call and started on Valtrex. States her last outbreak was approximately 2 year ago. Has used Valtrex in the past and states that she is not sure if she had reaction in the past. She has taken  2 tablets on Saturday, 1 on  Sunday and one tablet today. She has been able to tolerate fluids today and decided to take a dose to see if it bothered her or not. As of our phone call, she denied vomiting or diarrhea or abdominal pain. She denies fevers or abdominal pain. States "I think I may be dehydrated" also asks "Could palpitations be a sign of dehydration?" I advised yes, and that if she was having palpitations she should not wait to seek medical care and having palpitations could be a medical emergency. She then states "Oh, well I would definitely call 911 if I thought I needed to". I advised patient that she needs an office visit to assess if these issues were r/t valtrex or to r/o other issues. Patient declines office visit. Advised that we are GYN office and that all of these symptoms warrant evaluation by PCP and if sudden or increasing, change from her normal, recommend ED immediately. Patient states she will see how she feels with this last dose of Valtrex she has taken this afternoon and will call office with update in the morning.  I advised patient again to not wait to seek medical care if her symptoms persist or worsen. She verbalized instructions and will call back in the morning.

## 2013-04-15 NOTE — Telephone Encounter (Signed)
Pt states she called on sat and had valtrex called into the pharmacy. She took two on Saturday and 1 on Sunday. Pt started vomiting, diarrhea and cramping.Pt is concerned should she continue the valtrex or could this be possible side effects or some type of virus. Pt states she is feeling really bad.

## 2013-04-15 NOTE — Telephone Encounter (Signed)
Per Dr. Farrel Gobble, okay to order phenergan. Notified patient. She will pick up medication and will try to push fluids (gatorade Suggested) and call back with update. Patient agreeable.

## 2013-04-16 ENCOUNTER — Encounter (HOSPITAL_COMMUNITY): Payer: Self-pay | Admitting: Emergency Medicine

## 2013-04-16 ENCOUNTER — Telehealth: Payer: Self-pay | Admitting: Orthopedic Surgery

## 2013-04-16 ENCOUNTER — Emergency Department (HOSPITAL_COMMUNITY)
Admission: EM | Admit: 2013-04-16 | Discharge: 2013-04-16 | Disposition: A | Discharge status: Home / Self Care | Payer: BC Managed Care – PPO | Attending: Emergency Medicine | Admitting: Emergency Medicine

## 2013-04-16 DIAGNOSIS — Z8619 Personal history of other infectious and parasitic diseases: Secondary | ICD-10-CM | POA: Insufficient documentation

## 2013-04-16 DIAGNOSIS — R1110 Vomiting, unspecified: Secondary | ICD-10-CM

## 2013-04-16 DIAGNOSIS — Z87828 Personal history of other (healed) physical injury and trauma: Secondary | ICD-10-CM | POA: Insufficient documentation

## 2013-04-16 DIAGNOSIS — N949 Unspecified condition associated with female genital organs and menstrual cycle: Secondary | ICD-10-CM | POA: Insufficient documentation

## 2013-04-16 DIAGNOSIS — R197 Diarrhea, unspecified: Secondary | ICD-10-CM | POA: Insufficient documentation

## 2013-04-16 DIAGNOSIS — R51 Headache: Secondary | ICD-10-CM | POA: Insufficient documentation

## 2013-04-16 DIAGNOSIS — R109 Unspecified abdominal pain: Secondary | ICD-10-CM | POA: Insufficient documentation

## 2013-04-16 DIAGNOSIS — Z79899 Other long term (current) drug therapy: Secondary | ICD-10-CM | POA: Insufficient documentation

## 2013-04-16 DIAGNOSIS — Z8719 Personal history of other diseases of the digestive system: Secondary | ICD-10-CM | POA: Insufficient documentation

## 2013-04-16 DIAGNOSIS — Z8669 Personal history of other diseases of the nervous system and sense organs: Secondary | ICD-10-CM | POA: Insufficient documentation

## 2013-04-16 DIAGNOSIS — R112 Nausea with vomiting, unspecified: Secondary | ICD-10-CM | POA: Insufficient documentation

## 2013-04-16 DIAGNOSIS — IMO0001 Reserved for inherently not codable concepts without codable children: Secondary | ICD-10-CM | POA: Insufficient documentation

## 2013-04-16 DIAGNOSIS — F411 Generalized anxiety disorder: Secondary | ICD-10-CM | POA: Insufficient documentation

## 2013-04-16 DIAGNOSIS — Z87448 Personal history of other diseases of urinary system: Secondary | ICD-10-CM | POA: Insufficient documentation

## 2013-04-16 LAB — COMPREHENSIVE METABOLIC PANEL
ALT: 9 U/L (ref 0–35)
AST: 13 U/L (ref 0–37)
Albumin: 3.7 g/dL (ref 3.5–5.2)
Alkaline Phosphatase: 96 U/L (ref 39–117)
Potassium: 3.7 mEq/L (ref 3.5–5.1)
Sodium: 137 mEq/L (ref 135–145)
Total Protein: 7 g/dL (ref 6.0–8.3)

## 2013-04-16 LAB — CBC WITH DIFFERENTIAL/PLATELET
Basophils Absolute: 0 10*3/uL (ref 0.0–0.1)
Basophils Relative: 0 % (ref 0–1)
Eosinophils Absolute: 0.1 10*3/uL (ref 0.0–0.7)
Lymphs Abs: 3.1 10*3/uL (ref 0.7–4.0)
MCH: 30.6 pg (ref 26.0–34.0)
MCHC: 34.8 g/dL (ref 30.0–36.0)
Neutrophils Relative %: 60 % (ref 43–77)
Platelets: 253 10*3/uL (ref 150–400)
RBC: 4.15 MIL/uL (ref 3.87–5.11)
RDW: 12.8 % (ref 11.5–15.5)

## 2013-04-16 MED ORDER — ONDANSETRON HCL 4 MG/2ML IJ SOLN
4.0000 mg | Freq: Once | INTRAMUSCULAR | Status: AC
  Administered 2013-04-16: 4 mg via INTRAVENOUS
  Filled 2013-04-16: qty 2

## 2013-04-16 MED ORDER — KETOROLAC TROMETHAMINE 30 MG/ML IJ SOLN
30.0000 mg | Freq: Once | INTRAMUSCULAR | Status: AC
  Administered 2013-04-16: 30 mg via INTRAVENOUS
  Filled 2013-04-16: qty 1

## 2013-04-16 MED ORDER — MORPHINE SULFATE 4 MG/ML IJ SOLN
4.0000 mg | INTRAMUSCULAR | Status: DC | PRN
  Administered 2013-04-16: 4 mg via INTRAVENOUS
  Filled 2013-04-16: qty 1

## 2013-04-16 MED ORDER — SODIUM CHLORIDE 0.9 % IV BOLUS (SEPSIS)
1000.0000 mL | Freq: Once | INTRAVENOUS | Status: AC
  Administered 2013-04-16: 1000 mL via INTRAVENOUS

## 2013-04-16 MED ORDER — DICYCLOMINE HCL 20 MG PO TABS: 20.0000 mg | ORAL_TABLET | Freq: Two times a day (BID) | ORAL | Status: DC

## 2013-04-16 MED ORDER — ONDANSETRON 4 MG PO TBDP: 4.0000 mg | ORAL_TABLET | Freq: Three times a day (TID) | ORAL | Status: DC | PRN

## 2013-04-16 MED ORDER — GLYCOPYRROLATE 0.2 MG/ML IJ SOLN
0.2000 mg | Freq: Once | INTRAMUSCULAR | Status: AC
  Administered 2013-04-16: 0.2 mg via INTRAVENOUS
  Filled 2013-04-16: qty 1

## 2013-04-16 NOTE — ED Notes (Addendum)
Pt c/o abd pain n/v and headache x 5 days. States she feels like something has hit her in her left jaw also c/o a stinging sensation in mid back. States she had diarrhea at first now has a hard time having a BM.

## 2013-04-16 NOTE — Telephone Encounter (Signed)
Patient returned call for update. States that vaginal blisters are improving. Did not pick up phenergan states that pharmacy was out of phenergan. She took Valtrex this am. Still having ongoing abdominal pain. States that nausea is much improved. Denies fevers, and denies vomiting/diarrhea. Again, offered patient OV with Dr. Farrel Gobble, patient declines. She feels these symptoms are "stomach virus related" and was near sick children on Thanksgiving. She will call her pcp to discuss abdominal pain. Advised patient to call back if she changes her mind regarding OV today with Dr. Farrel Gobble. Patient is agreeable.   Routing to provider for final review. Patient agreeable to disposition. Will close encounter

## 2013-04-16 NOTE — ED Provider Notes (Signed)
[name]: [id number]     Arrival date & time 04/16/13  1538 History   First MD Initiated Contact with Patient 04/16/13 1705     Chief Complaint  Patient presents with  . Headache  . Nausea  . Emesis  . Diarrhea   HPI  Presents with the complaint of body aches vomiting and diarrhea. She is accompanied medical history. Is on treatment for multiple sclerosis for 10 years. This was a diagnosis is apparently reviewed. She has a diagnosis of fibromyalgia. Did not tolerate Lyrica because of side effects. Normal thanksgiving on Thursday. Today is Tuesday. On Friday she had some general pain. She has a history of lichen sclerosis. Has a distant history of genital herpes. She is in frequent outbreaks. 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 She uses a steroid cream. By Friday night Saturday morning she had some tenderness in her groin and some vesicles nebulizer there was no breakage of herpes. She called her OB/GYN. Lyrica was called in. She did get Saturday morning and evening, Sunday morning and evening, yesterday morning and evening. Her symptoms with her groin or better. However she has persisted with nausea vomiting and diarrhea for the last 3 days with intermittent abdominal cramping. She has a headache she has bodyaches and she presents here.  Past Medical History  Diagnosis Date  . Interstitial cystitis   . Fibromyalgia   . Anxiety   . Lichen sclerosus of female genitalia   . Manic depression   . HSV-2 infection     rare occurence  . MS (multiple sclerosis)   . IBS (irritable bowel syndrome)   . History of self mutilation   . Arm sprain 5/11    right   . Lichen sclerosus     Vulva   Past Surgical History  Procedure Laterality Date  . Endometrial ablation  1997/1998  . Breast enhancement surgery  1990   History reviewed. No pertinent family history. History  Substance Use Topics  . Smoking status: Never Smoker   . Smokeless tobacco: Not on file  . Alcohol Use: No   OB History   Grav Para Term Preterm Abortions TAB SAB Ect Mult Living   2 2       1 3      Review of Systems  Constitutional: Negative for fever, chills, diaphoresis, appetite change and fatigue.  HENT: Negative for mouth sores, sore throat and trouble swallowing.        Headache.    Eyes: Negative for visual disturbance.  Respiratory: Negative for cough, chest tightness, shortness of breath and wheezing.   Cardiovascular: Negative for chest pain.  Gastrointestinal: Positive for nausea, vomiting, abdominal pain and diarrhea. Negative for abdominal distention.  Endocrine: Negative for polydipsia, polyphagia and polyuria.  Genitourinary: Positive for genital sores. Negative for dysuria, frequency and hematuria.       Inguinal pain.  Musculoskeletal: Positive for arthralgias, back pain and myalgias. Negative for gait problem.  Skin: Negative for color change, pallor and rash.  Neurological: Negative for dizziness, syncope, light-headedness and headaches.  Hematological: Does not bruise/bleed easily.  Psychiatric/Behavioral: Negative for behavioral problems and confusion.    Allergies  Cefzil; Ciprofloxacin; Levofloxacin; Monistat; Neosporin; Other; Quinolones; Septra; and Adhesive  Home Medications   Current Outpatient Rx  Name  Route  Sig  Dispense  Refill  . acetaminophen (TYLENOL) 500 MG tablet   Oral   Take 1,000 mg by mouth every 6 (six) hours as needed (headache).         Marland Kitchen  clobetasol ointment (TEMOVATE) 0.05 %      Pea size amount to affected areas once a day   30 g   1   . clonazePAM (KLONOPIN) 2 MG tablet   Oral   Take 1-2 mg by mouth daily as needed for anxiety. 1mg  in the morning and 1mg  at night         . DiphenhydrAMINE HCl (BENADRYL PO)   Oral   Take by mouth as needed.         Marland Kitchen HYDROcodone-acetaminophen (NORCO) 10-325 MG per tablet   Oral   Take 1 tablet by mouth every 4 (four) hours as needed (pain).         . Multiple Vitamins-Minerals (MULTIVITAMIN PO)    Oral   Take by mouth.         . naproxen sodium (ANAPROX) 220 MG tablet   Oral   Take 220 mg by mouth 2 (two) times daily with a meal.         . omeprazole (PRILOSEC) 20 MG capsule   Oral   Take 20 mg by mouth daily.         . promethazine (PHENERGAN) 12.5 MG tablet   Oral   Take 1 tablet (12.5 mg total) by mouth every 6 (six) hours as needed for nausea or vomiting.   30 tablet   0   . QUEtiapine (SEROQUEL) 25 MG tablet   Oral   Take 25 mg by mouth as needed.          . Silicone 30 % OINT   Apply externally   Apply 1 application topically daily. Applied to scar on face         . traZODone (DESYREL) 100 MG tablet   Oral   Take 200 mg by mouth at bedtime.         . valACYclovir (VALTREX) 500 MG tablet   Oral   Take 500 mg by mouth 2 (two) times daily.         Marland Kitchen venlafaxine XR (EFFEXOR-XR) 75 MG 24 hr capsule   Oral   Take 225 mg by mouth every morning. Take 3 daily         . dicyclomine (BENTYL) 20 MG tablet   Oral   Take 1 tablet (20 mg total) by mouth 2 (two) times daily.   20 tablet   0   . ondansetron (ZOFRAN ODT) 4 MG disintegrating tablet   Oral   Take 1 tablet (4 mg total) by mouth every 8 (eight) hours as needed for nausea.   20 tablet   0    BP 122/80  Pulse 99  Temp(Src) 98 F (36.7 C) (Oral)  Resp 20  SpO2 98%  LMP 02/25/2013 Physical Exam  Constitutional: She is oriented to person, place, and time. She appears well-developed and well-nourished. No distress.  HENT:  Head: Normocephalic.  No abnormalities of mucosa of her mouth. No swelling or abnormalities of the neck normal dentition. No findings that would suggest an etiology for her left jaw pain and headache. She has a supple neck. She has normal cranial nerves.  Eyes: Conjunctivae are normal. Pupils are equal, round, and reactive to light. No scleral icterus.  Neck: Normal range of motion. Neck supple. No thyromegaly present.  Cardiovascular: Normal rate and regular rhythm.   Exam reveals no gallop and no friction rub.   No murmur heard. Pulmonary/Chest: Effort normal and breath sounds normal. No respiratory distress. She has no wheezes. She  has no rales.  Abdominal: Soft. Bowel sounds are normal. She exhibits no distension. There is no tenderness. There is no rebound.    No peritoneal irritation. Normal active bowel sounds. Slightly hyperactive  Musculoskeletal: Normal range of motion.  Neurological: She is alert and oriented to person, place, and time.  Skin: Skin is warm and dry. No rash noted.  Psychiatric: She has a normal mood and affect. Her behavior is normal.    ED Course  Procedures (including critical care time) Labs Review Labs Reviewed  CBC WITH DIFFERENTIAL  COMPREHENSIVE METABOLIC PANEL   Imaging Review No results found.  EKG Interpretation   None       MDM   1. Vomiting   2. Abdominal pain    Her symptoms are improved after IV fluids morphine and Zofran. Electrolytes and CBC are normal. Think her symptoms may be multifactorial. This may be simple viral infection. This may be viral symptoms secondary to her recent herpes outbreak. Without a pelvic exam unable to confirm this. She still declines pelvic exam. This may be side effect from her Valtrex. Shoulder Valtrex as she states her symptoms in her perineum improving. Discharged home with Bentyl and Zofran. Recheck with any evolving symptoms.    Roney Marion, MD 04/16/13 [phone]

## 2013-04-16 NOTE — Telephone Encounter (Signed)
Spoke to pt who called to say that she feels really "weird" with headache that started with onset of stomach virus symptoms. Pt says her left wrist is really hurting, as well as her right ear. She feels cold and is having chills, so she assumes she has a fever. Pt has called mom to come get her and take her to ER, as she cannot drive right now. Pt wanted to let Dr. Farrel Gobble know.

## 2013-05-20 ENCOUNTER — Other Ambulatory Visit: Payer: Self-pay

## 2013-05-20 DIAGNOSIS — Z1231 Encounter for screening mammogram for malignant neoplasm of breast: Secondary | ICD-10-CM

## 2013-06-05 ENCOUNTER — Telehealth: Payer: Self-pay | Admitting: Gynecology

## 2013-06-05 NOTE — Telephone Encounter (Signed)
Spoke with pt who is having horrible hot flashes and sweating. No cycle since October. Several neg UPT by PCP and ER. Pt has been seen for abdominal pain that her PCP attributes to IBS. Pt would like to discuss GYN issues with TL. Sched appt 06-11-13 at 4:30.

## 2013-06-05 NOTE — Telephone Encounter (Signed)
Patient hasnt had cycle since October. Has taken multiple pregnancy test all negative. Wants to see about getting hormones checked having a lot of hot flashes

## 2013-06-06 ENCOUNTER — Other Ambulatory Visit: Payer: Self-pay

## 2013-06-06 DIAGNOSIS — Z9882 Breast implant status: Secondary | ICD-10-CM

## 2013-06-06 DIAGNOSIS — Z1231 Encounter for screening mammogram for malignant neoplasm of breast: Secondary | ICD-10-CM

## 2013-06-11 ENCOUNTER — Ambulatory Visit: Payer: BC Managed Care – PPO | Admitting: Gynecology

## 2013-06-11 NOTE — Telephone Encounter (Signed)
Patent says she is having "tummy trouble" and rescheduled her appointment today. Patient rescheduled to 06/18/13 @ 9:00.

## 2013-06-14 ENCOUNTER — Ambulatory Visit
Admission: RE | Admit: 2013-06-14 | Discharge: 2013-06-14 | Disposition: A | Discharge status: Home / Self Care | Payer: BC Managed Care – PPO | Source: Ambulatory Visit

## 2013-06-14 DIAGNOSIS — Z9882 Breast implant status: Secondary | ICD-10-CM

## 2013-06-14 DIAGNOSIS — Z1231 Encounter for screening mammogram for malignant neoplasm of breast: Secondary | ICD-10-CM

## 2013-06-18 ENCOUNTER — Ambulatory Visit (INDEPENDENT_AMBULATORY_CARE_PROVIDER_SITE_OTHER): Payer: BC Managed Care – PPO | Admitting: Gynecology

## 2013-06-18 ENCOUNTER — Encounter: Payer: Self-pay | Admitting: Gynecology

## 2013-06-18 ENCOUNTER — Telehealth: Payer: Self-pay | Admitting: Gynecology

## 2013-06-18 VITALS — BP 108/60 | HR 74 | Resp 18 | Ht 64.5 in | Wt 158.0 lb

## 2013-06-18 DIAGNOSIS — N852 Hypertrophy of uterus: Secondary | ICD-10-CM

## 2013-06-18 DIAGNOSIS — L940 Localized scleroderma [morphea]: Secondary | ICD-10-CM

## 2013-06-18 DIAGNOSIS — N912 Amenorrhea, unspecified: Secondary | ICD-10-CM

## 2013-06-18 DIAGNOSIS — L900 Lichen sclerosus et atrophicus: Secondary | ICD-10-CM

## 2013-06-18 LAB — POCT URINE PREGNANCY: Preg Test, Ur: NEGATIVE

## 2013-06-18 MED ORDER — CLOBETASOL PROPIONATE 0.05 % EX OINT: TOPICAL_OINTMENT | CUTANEOUS | Status: DC

## 2013-06-18 NOTE — Addendum Note (Signed)
Addended by: Lorraine Lax on: 06/18/2013 10:15 AM   Modules accepted: Orders

## 2013-06-18 NOTE — Telephone Encounter (Signed)
Advised patient of $40 copay quoted by insurance for PUS/ scheduled PUS/ advised patient of cancellation policy and cancellation fee//ssf

## 2013-06-18 NOTE — Patient Instructions (Signed)
FOLLOW UP WITH PLASTIC SURGEON RE IMPLANTS U/S TO EVALUATE UTERUS AND TENDERNESS-WILL CALL WITH APPT CONDOMS

## 2013-06-18 NOTE — Progress Notes (Signed)
Subjective:     Patient ID: EMBREY LEGGITT, female   DOB: [birth year], 48 y.o.   MRN: [id number]  HPI Comments: Pt is here reporting absence of menses.  Pt had a urine pregnancy test done in December by another provider and was negative.  Pt is now reporting hot flashes.  LMP October.  Pt had ablation in past but still had cycles. Pt states that she is on no new medications.  She has started at the gym in January and has lost 5#.  Pt states that she gets rushes of hot flashes that are intense.  Pt is on trazadone and clonapin at night so is unaware of night sweats.  Pt also reports pressure down low suprapubic for a few months but has gotten worse in the last month.  Pt is doing some free weights and nautalis machines.  Pt reports that she is having issues with elevated heart rate but no chest pains.  She denies using any herbals or stimulators.   Pt reports some breast tenderness, she had implants placed 25y ago, normal mammogram.  Pt does not need to sleep in bra. Pt reports less sexual activity due to boyfriend's and her issues    Review of Systems Per HPI    Objective:   Physical Exam  Constitutional: She is oriented to person, place, and time. She appears well-developed and well-nourished.  Pulmonary/Chest: Right breast exhibits tenderness. Right breast exhibits no inverted nipple, no mass, no nipple discharge and no skin change. Left breast exhibits tenderness. Left breast exhibits no inverted nipple, no mass, no nipple discharge and no skin change. Breasts are symmetrical.    Abdominal: Soft. She exhibits no distension. There is no tenderness. There is no rebound.  Genitourinary: Vagina normal.     Lymphadenopathy:       Right: No inguinal adenopathy present.       Left: No inguinal adenopathy present.  Neurological: She is alert and oriented to person, place, and time.  Skin: Skin is warm and dry.  Pelvic exam: VULVA: see picture, VAGINA: normal appearing vagina with normal  color and discharge, no lesions, CERVIX: normal appearing cervix without discharge or lesions, UTERUS: enlarged to 10 week's size, towards left, no distinct fibroid palpated, tender, ADNEXA: no masses.      Assessment:     absent menses  Multiple psych medications Enlarged uterus Breast tenderness with implants Lichen sclerosis History of ablation    Plan:     Negative upt, TSH, FSH, PRL,  PUS to evaluate pelvis Pt to see plastics regarding implants Refill temovate-LS looking good Stressed importance of condoms for contraception

## 2013-06-19 LAB — FOLLICLE STIMULATING HORMONE: FSH: 2.2 m[IU]/mL

## 2013-06-19 LAB — TSH: TSH: 1.737 u[IU]/mL (ref 0.350–4.500)

## 2013-06-19 LAB — PROLACTIN: Prolactin: 7.6 ng/mL

## 2013-06-24 ENCOUNTER — Telehealth: Payer: Self-pay | Admitting: Gynecology

## 2013-06-24 DIAGNOSIS — N912 Amenorrhea, unspecified: Secondary | ICD-10-CM

## 2013-06-24 NOTE — Telephone Encounter (Signed)
Patient is calling to get results form 06/18/13 appt

## 2013-06-24 NOTE — Telephone Encounter (Signed)
Returned call, mailbox full. Will have to try again.  Mychart message sent.

## 2013-06-24 NOTE — Telephone Encounter (Signed)
Message copied by Joeseph AmorFAST, Auren Valdes L on Mon Jun 24, 2013 10:33 AM ------      Message from: Douglass RiversLATHROP, Ioma Chismar      Created: Wed Jun 19, 2013  8:59 AM       Inform labs are normal and NOT consistent with being close to menopause ------

## 2013-06-25 ENCOUNTER — Ambulatory Visit (INDEPENDENT_AMBULATORY_CARE_PROVIDER_SITE_OTHER): Payer: BC Managed Care – PPO | Admitting: Gynecology

## 2013-06-25 ENCOUNTER — Ambulatory Visit (INDEPENDENT_AMBULATORY_CARE_PROVIDER_SITE_OTHER): Payer: BC Managed Care – PPO

## 2013-06-25 ENCOUNTER — Encounter: Payer: Self-pay | Admitting: Gynecology

## 2013-06-25 VITALS — BP 100/74 | HR 69 | Resp 18 | Ht 64.5 in | Wt 157.0 lb

## 2013-06-25 DIAGNOSIS — N912 Amenorrhea, unspecified: Secondary | ICD-10-CM

## 2013-06-25 DIAGNOSIS — N852 Hypertrophy of uterus: Secondary | ICD-10-CM

## 2013-06-25 MED ORDER — MEDROXYPROGESTERONE ACETATE 10 MG PO TABS: 10.0000 mg | ORAL_TABLET | Freq: Every day | ORAL | Status: DC

## 2013-06-25 NOTE — Progress Notes (Signed)
     Pt here for u/s for enalrged uterus by exam deivated to the left.   U/s images reviewed with pt, uterus ins normal, retroverted, left ovary is close to fundus, no masses noted.  Pt assured Regarding her amenorrhea.  Pt is s/p HTA ablation in 1998 and hadno cycle for approx 2y, she then had regular cycles until 10/14 and none since-TSH, FSH PRL HCG  All negative, lining thin 3.375mm. Discussed endometrial hyperplasia risks with pt and suggest we attempt a withdraw bleed with provera, she is agreeable, she will let us know either way if she bleeds. Questions addressed 6653m spent discussing risks of amenorrhea treatment, >50% face to face

## 2013-06-25 NOTE — Telephone Encounter (Signed)
Spoke with pt to advise that a MyChart message was sent to let her know her lab results were normal and not consistent with menopause. Pt also inquired about TSH and she was advised it was also normal. Pt appreciative.

## 2013-07-15 DIAGNOSIS — M75100 Unspecified rotator cuff tear or rupture of unspecified shoulder, not specified as traumatic: Secondary | ICD-10-CM | POA: Insufficient documentation

## 2013-07-15 NOTE — Telephone Encounter (Signed)
Advised patient that Dr. Farrel GobbleLathrop would like her to schedule an office visit to discuss. Patient hesitant since she just had an office visit. Patient states she has been sexually active. States she doesn't feel as though she should take a pregnancy test.  Patient is requesting to try more provera to see if she has any withdrawal bleeding. I advised I would send her request to Dr. Farrel GobbleLathrop.

## 2013-07-15 NOTE — Telephone Encounter (Signed)
yes

## 2013-07-15 NOTE — Telephone Encounter (Signed)
Message left to return call to [facility] at [phone] to schedule office visit with Dr. Farrel GobbleLathrop.

## 2013-07-15 NOTE — Telephone Encounter (Signed)
Pt says she has taken the last progesterone pill and still no period. Please call to advise.

## 2013-07-15 NOTE — Telephone Encounter (Signed)
Dr. Farrel Gobble, it appears that patient has completed her 10 days of Provera and it has been close to two weeks after completing with no withdrawal bleeding. What do you want patient to do next? Office visit?

## 2013-07-16 NOTE — Telephone Encounter (Signed)
Lab order will be fine,dropped

## 2013-07-16 NOTE — Telephone Encounter (Signed)
Dr. Farrel Gobble, patient is agreeable to come in for a pregnancy test here for a lab visit. She is tearful at suggestion of another office visit because of cost of copays. Patient states she really cannot come in for another office visit. I offered lab visit for pregnancy testing. Can you place a lab order?

## 2013-07-16 NOTE — Telephone Encounter (Signed)
Cannot give provera without neg preg test, she is at risk for ectopic due to ablation

## 2013-07-23 ENCOUNTER — Other Ambulatory Visit: Payer: BC Managed Care – PPO

## 2013-07-23 ENCOUNTER — Ambulatory Visit (INDEPENDENT_AMBULATORY_CARE_PROVIDER_SITE_OTHER): Payer: BC Managed Care – PPO | Admitting: Gynecology

## 2013-07-23 DIAGNOSIS — N912 Amenorrhea, unspecified: Secondary | ICD-10-CM

## 2013-07-24 LAB — HCG, SERUM, QUALITATIVE: Preg, Serum: NEGATIVE

## 2013-07-24 MED ORDER — MEDROXYPROGESTERONE ACETATE 10 MG PO TABS: 10.0000 mg | ORAL_TABLET | Freq: Every day | ORAL | Status: DC

## 2013-07-25 ENCOUNTER — Telehealth: Payer: Self-pay | Admitting: Gynecology

## 2013-07-25 NOTE — Telephone Encounter (Signed)
Patient is calling about results from test she had done the other day.

## 2013-07-25 NOTE — Telephone Encounter (Signed)
Dr. Farrel Gobble, can patient start Provera at any time?  Spoke with patient and message from Dr. Farrel Gobble given regarding Negative Hcg.  Patient aware provera at pharmacy and may not have a bleed post provera due to ablation and thinning of lining.   Notes Recorded by Bennye Alm, MD on 07/24/2013 at 10:40 AM hcg negative, will send in provera again but if you don't bleed it may be that the lining is now too thin after the ablation finally! Sent mychart

## 2013-07-26 NOTE — Telephone Encounter (Signed)
SINCE SHE HAD HER NEG UPT SHOULD BE SOONER THAN LATER

## 2013-07-29 NOTE — Telephone Encounter (Signed)
Spoke with patient. She is aware.

## 2013-07-30 ENCOUNTER — Telehealth: Payer: Self-pay | Admitting: Gynecology

## 2013-07-30 NOTE — Telephone Encounter (Signed)
Patient is having trouble taking oral contraceptives. Patient says she is 48 years old and has not taken oc's since 48 years of age. Patient says she is a "weeping,sobbing, mess" when she takes any type of hormones. Please call ASAP.

## 2013-07-31 NOTE — Telephone Encounter (Signed)
Message left to return call to Jadwiga Faidley at [phone].    

## 2013-08-05 NOTE — Telephone Encounter (Signed)
Patient returning call from triage nurse.

## 2013-08-05 NOTE — Telephone Encounter (Signed)
LMTCB

## 2013-08-05 NOTE — Telephone Encounter (Signed)
Message left to return call to Merl Bommarito at [phone].    

## 2013-08-06 NOTE — Telephone Encounter (Signed)
Message left to return call to Addisynn Vassell at [phone].    

## 2013-08-07 NOTE — Telephone Encounter (Signed)
Spoke with patient. Patient states that she "Is not so good." Recently had a change in her psych medications and is having trouble sleeping. Seroquel and Trazodone were increased but patient states she has not slept in 30 hours and "can't control my moods." States had an ablation in 97. Having hot flashes and night sweats since Oct 2014. Patient states LMP Oct 2014. Taking Provera. Last pill taken 2-3 days ago and has not had any bleeding at this time. Advised patient that it could take up to 2-3 weeks to see any bleeding after finishing Provera. Patient verbalizes understanding stating that she spoke with Dr.Lathrop about this at last visit. Office visit with Dr.Lathrop offered to ease worrying but patient declined due to financial responsibilities. States she will call back in 2 weeks to notify us of bleeding.

## 2013-08-21 ENCOUNTER — Emergency Department (HOSPITAL_COMMUNITY)
Admission: EM | Admit: 2013-08-21 | Discharge: 2013-08-21 | Disposition: A | Discharge status: Home / Self Care | Payer: BC Managed Care – PPO | Attending: Emergency Medicine | Admitting: Emergency Medicine

## 2013-08-21 ENCOUNTER — Encounter (HOSPITAL_COMMUNITY): Payer: Self-pay | Admitting: Emergency Medicine

## 2013-08-21 DIAGNOSIS — X789XXA Intentional self-harm by unspecified sharp object, initial encounter: Secondary | ICD-10-CM | POA: Insufficient documentation

## 2013-08-21 DIAGNOSIS — F411 Generalized anxiety disorder: Secondary | ICD-10-CM | POA: Insufficient documentation

## 2013-08-21 DIAGNOSIS — F329 Major depressive disorder, single episode, unspecified: Secondary | ICD-10-CM | POA: Insufficient documentation

## 2013-08-21 DIAGNOSIS — Z8669 Personal history of other diseases of the nervous system and sense organs: Secondary | ICD-10-CM | POA: Insufficient documentation

## 2013-08-21 DIAGNOSIS — Z8719 Personal history of other diseases of the digestive system: Secondary | ICD-10-CM | POA: Insufficient documentation

## 2013-08-21 DIAGNOSIS — Z87828 Personal history of other (healed) physical injury and trauma: Secondary | ICD-10-CM | POA: Insufficient documentation

## 2013-08-21 DIAGNOSIS — Z8742 Personal history of other diseases of the female genital tract: Secondary | ICD-10-CM | POA: Insufficient documentation

## 2013-08-21 DIAGNOSIS — S91012A Laceration without foreign body, left ankle, initial encounter: Secondary | ICD-10-CM

## 2013-08-21 DIAGNOSIS — Z8619 Personal history of other infectious and parasitic diseases: Secondary | ICD-10-CM | POA: Insufficient documentation

## 2013-08-21 DIAGNOSIS — Z872 Personal history of diseases of the skin and subcutaneous tissue: Secondary | ICD-10-CM | POA: Insufficient documentation

## 2013-08-21 DIAGNOSIS — S81009A Unspecified open wound, unspecified knee, initial encounter: Secondary | ICD-10-CM | POA: Insufficient documentation

## 2013-08-21 DIAGNOSIS — Z79899 Other long term (current) drug therapy: Secondary | ICD-10-CM | POA: Insufficient documentation

## 2013-08-21 NOTE — ED Provider Notes (Signed)
Medical screening examination/treatment/procedure(s) were performed by non-physician practitioner and as supervising physician I was immediately available for consultation/collaboration.   EKG Interpretation None       Juliet Rude. Rubin Payor, MD 08/21/13 [phone]

## 2013-08-21 NOTE — ED Provider Notes (Signed)
[name]: [id number]     Arrival date & time 08/21/13  1739 History  This chart was scribed for non-physician practitioner Ivonne Andrew, PA-C working with Juliet Rude. Rubin Payor, MD by Danella Maiers, ED Scribe. This patient was seen in room WTR4/WLPT4 and the patient's care was started at 8:22 PM.    Chief Complaint  Patient presents with  . Laceration   The history is provided by the patient. No language interpreter was used.   HPI Comments: Erika Mccoy is a 48 y.o. female who presents to the Emergency Department complaining of a laceration to her left ankle from cutting herself with a steak knife around 5pm today after getting into an argument with her boyfriend. Bleeding is controlled. Pt has a h/o cutting herself. She states she has been doing much better about cutting recently but tonight was a setback. She denies SI or HI. She states her psychiatrist added Lamictal 25 mg 2 weeks ago to help her mood. She states it has not helped yet but is supposed to take one month to work.   Past Medical History  Diagnosis Date  . Interstitial cystitis   . Fibromyalgia   . Anxiety   . Lichen sclerosus of female genitalia   . Manic depression   . HSV-2 infection     rare occurence  . MS (multiple sclerosis)   . IBS (irritable bowel syndrome)   . History of self mutilation   . Arm sprain 5/11    right   . Lichen sclerosus     Vulva   Past Surgical History  Procedure Laterality Date  . Endometrial ablation  1997/1998  . Breast enhancement surgery  1990   No family history on file. History  Substance Use Topics  . Smoking status: Never Smoker   . Smokeless tobacco: Not on file  . Alcohol Use: No   OB History   Grav Para Term Preterm Abortions TAB SAB Ect Mult Living   2 2       1 3      Review of Systems  Skin: Positive for wound.  All other systems reviewed and are negative.     Allergies  Cefzil; Lidocaine; Monistat; Neosporin; Quinolones; Septra; and Adhesive  Home  Medications   Current Outpatient Rx  Name  Route  Sig  Dispense  Refill  . acetaminophen (TYLENOL) 500 MG tablet   Oral   Take 1,000 mg by mouth every 6 (six) hours as needed (headache).         . clobetasol ointment (TEMOVATE) 0.05 %      Pea size amount to affected areas once a day   60 g   1   . clonazePAM (KLONOPIN) 2 MG tablet   Oral   Take 1-2 mg by mouth daily as needed for anxiety. 1mg  in the morning and 2mg  at night         . diazepam (DIASAT) 20 MG GEL   Vaginal   Place 10 mg vaginally 2 (two) times daily.         Marland Kitchen HYDROcodone-acetaminophen (NORCO) 10-325 MG per tablet   Oral   Take 1 tablet by mouth every 4 (four) hours as needed (pain).         Marland Kitchen lamoTRIgine (LAMICTAL) 25 MG tablet   Oral   Take 25 mg by mouth at bedtime.         Marland Kitchen loratadine (CLARITIN) 10 MG tablet   Oral   Take 10 mg by mouth daily.         Marland Kitchen  Multiple Vitamins-Minerals (MULTIVITAMIN PO)   Oral   Take 1 tablet by mouth daily.          Marland Kitchen. omeprazole (PRILOSEC) 20 MG capsule   Oral   Take 20 mg by mouth daily.         . QUEtiapine (SEROQUEL) 25 MG tablet   Oral   Take 25 mg by mouth daily as needed (when going through bad times).          . Silicone 30 % OINT   Apply externally   Apply 1 application topically daily. Applied to scar on face         . traZODone (DESYREL) 100 MG tablet   Oral   Take 300 mg by mouth at bedtime.          Marland Kitchen. venlafaxine XR (EFFEXOR-XR) 75 MG 24 hr capsule   Oral   Take 225 mg by mouth every morning. Take 3 daily          BP 101/68  Pulse 86  Temp(Src) 98.8 F (37.1 C) (Oral)  Resp 14  SpO2 98%  LMP 02/20/2013 Physical Exam  Nursing note and vitals reviewed. Constitutional: She is oriented to person, place, and time. She appears well-developed and well-nourished. No distress.  HENT:  Head: Normocephalic and atraumatic.  Eyes: EOM are normal.  Neck: Neck supple. No tracheal deviation present.  Cardiovascular: Normal  rate.   Pulmonary/Chest: Effort normal. No respiratory distress.  Musculoskeletal: Normal range of motion.  Superficial laceration to the skin over the medial left ankle. No deep structure involvement. Full range of motion. Normal strength. Normal dorsal pedal pulses. Normal sensation in toes. Normal capillary refill.  Neurological: She is alert and oriented to person, place, and time.  Skin: Skin is warm and dry.  Psychiatric: She has a normal mood and affect. Her behavior is normal.    ED Course  Procedures   DIAGNOSTIC STUDIES: Oxygen Saturation is 98% on RA, normal by my interpretation.    COORDINATION OF CARE: 8:30 PM- Discussed treatment plan with pt which includes laceration repair. Pt agrees to plan.  LACERATION REPAIR Performed by: Ivonne AndrewPeter Lynden Carrithers, PA-C  Consent: Verbal consent obtained. Risks and benefits: risks, benefits and alternatives were discussed Patient identity confirmed: provided demographic data Time out performed prior to procedure Prepped and Draped in normal sterile fashion Wound explored Laceration Location: left ankle Laceration Length: 5cm No Foreign Bodies seen or palpated Anesthesia: local infiltration Local anesthetic: lidocaine 2% with epinephrine Anesthetic total: 6 ml Irrigation method: syringe Amount of cleaning: standard Skin closure: Skin 4-0 Prolene  Number of sutures or staples: 5  Technique: Simple interrupted  Patient tolerance: Patient tolerated the procedure well with no immediate complications.      MDM   Final diagnoses:  Laceration of left ankle   I personally performed the services described in this documentation, which was scribed in my presence. The recorded information has been reviewed and is accurate.     Angus Sellereter S Priyansh Pry, PA-C 08/21/13 2057

## 2013-08-21 NOTE — Discharge Instructions (Signed)
Keep your wound clean and dry. Have your sutures removed in 10 days.    Laceration Care, Adult A laceration is a cut or lesion that goes through all layers of the skin and into the tissue just beneath the skin. TREATMENT  Some lacerations may not require closure. Some lacerations may not be able to be closed due to an increased risk of infection. It is important to see your caregiver as soon as possible after an injury to minimize the risk of infection and maximize the opportunity for successful closure. If closure is appropriate, pain medicines may be given, if needed. The wound will be cleaned to help prevent infection. Your caregiver will use stitches (sutures), staples, wound glue (adhesive), or skin adhesive strips to repair the laceration. These tools bring the skin edges together to allow for faster healing and a better cosmetic outcome. However, all wounds will heal with a scar. Once the wound has healed, scarring can be minimized by covering the wound with sunscreen during the day for 1 full year. HOME CARE INSTRUCTIONS  For sutures or staples:  Keep the wound clean and dry.  If you were given a bandage (dressing), you should change it at least once a day. Also, change the dressing if it becomes wet or dirty, or as directed by your caregiver.  Wash the wound with soap and water 2 times a day. Rinse the wound off with water to remove all soap. Pat the wound dry with a clean towel.  After cleaning, apply a thin layer of the antibiotic ointment as recommended by your caregiver. This will help prevent infection and keep the dressing from sticking.  You may shower as usual after the first 24 hours. Do not soak the wound in water until the sutures are removed.  Only take over-the-counter or prescription medicines for pain, discomfort, or fever as directed by your caregiver.  Get your sutures or staples removed as directed by your caregiver. For skin adhesive strips:  Keep the wound  clean and dry.  Do not get the skin adhesive strips wet. You may bathe carefully, using caution to keep the wound dry.  If the wound gets wet, pat it dry with a clean towel.  Skin adhesive strips will fall off on their own. You may trim the strips as the wound heals. Do not remove skin adhesive strips that are still stuck to the wound. They will fall off in time. For wound adhesive:  You may briefly wet your wound in the shower or bath. Do not soak or scrub the wound. Do not swim. Avoid periods of heavy perspiration until the skin adhesive has fallen off on its own. After showering or bathing, gently pat the wound dry with a clean towel.  Do not apply liquid medicine, cream medicine, or ointment medicine to your wound while the skin adhesive is in place. This may loosen the film before your wound is healed.  If a dressing is placed over the wound, be careful not to apply tape directly over the skin adhesive. This may cause the adhesive to be pulled off before the wound is healed.  Avoid prolonged exposure to sunlight or tanning lamps while the skin adhesive is in place. Exposure to ultraviolet light in the first year will darken the scar.  The skin adhesive will usually remain in place for 5 to 10 days, then naturally fall off the skin. Do not pick at the adhesive film. You may need a tetanus shot if:  You  cannot remember when you had your last tetanus shot.  You have never had a tetanus shot. If you get a tetanus shot, your arm may swell, get red, and feel warm to the touch. This is common and not a problem. If you need a tetanus shot and you choose not to have one, there is a rare chance of getting tetanus. Sickness from tetanus can be serious. SEEK MEDICAL CARE IF:   You have redness, swelling, or increasing pain in the wound.  You see a red line that goes away from the wound.  You have yellowish-white fluid (pus) coming from the wound.  You have a fever.  You notice a bad smell  coming from the wound or dressing.  Your wound breaks open before or after sutures have been removed.  You notice something coming out of the wound such as wood or glass.  Your wound is on your hand or foot and you cannot move a finger or toe. SEEK IMMEDIATE MEDICAL CARE IF:   Your pain is not controlled with prescribed medicine.  You have severe swelling around the wound causing pain and numbness or a change in color in your arm, hand, leg, or foot.  Your wound splits open and starts bleeding.  You have worsening numbness, weakness, or loss of function of any joint around or beyond the wound.  You develop painful lumps near the wound or on the skin anywhere on your body. MAKE SURE YOU:   Understand these instructions.  Will watch your condition.  Will get help right away if you are not doing well or get worse. Document Released: 05/02/2005 Document Revised: 07/25/2011 Document Reviewed: 10/26/2010 [facility] Patient Information 2014 [place], [place].

## 2013-08-21 NOTE — ED Notes (Signed)
Pt states she has a hx of cutting herself in the past. Pt states she had an argument with boyfriend, which caused her to impulsively cut her left ankle. Pt states she cut deeper than she usually does and came to ED to get stitches. Pt states she has no SI/HI.

## 2013-08-26 NOTE — Telephone Encounter (Signed)
Saw note above, tried to call pt, mailbox full, could not leave message

## 2013-08-26 NOTE — Telephone Encounter (Signed)
Patient wants to let lathrop know that she got her cycle but it was very small. It was some brown spotting it was once when she went to the bathroom yesterday. Patient said "she can not that the provera it made her suicidal. 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 She would have cut her wrist but had someone with her luckily. She may even have tried to commit suicide if it wasn't for someone being with her. If Dr Farrel GobbleLathrop wants her to take anything else she declines it said she will just take the risks of uterine cancer, her life is more important. She is fine with not having a cycle and not taking any medication for it. Patient also said that if Dr Farrel GobbleLathrop wants her to come in and talk to her that is fine but she doesn't want any medication she is on enough already."

## 2013-08-27 ENCOUNTER — Ambulatory Visit: Payer: BC Managed Care – PPO | Admitting: Nurse Practitioner

## 2013-08-27 ENCOUNTER — Encounter: Payer: Self-pay | Admitting: Nurse Practitioner

## 2013-08-27 ENCOUNTER — Telehealth: Payer: Self-pay | Admitting: Nurse Practitioner

## 2013-08-27 NOTE — Telephone Encounter (Signed)
Spoke with patient. Patient states that Dr.Lathrop started her on Provera and after first trial she did not have menses. During second trial she states "I began to feel crazy. I was sad all the time and was crying nonstop. I was having the worst PMS of my life. I got really depressed and wanted to kill myself 3 days before my period. I was cutting my ankles but someone was at my house to stop me and get me help." Patient states that she did begin to spot after second trial of Provera. Patient is very hyper verbal and manic during phone conversation. Patient states she does not want to continue taking hormones because "They make me crazy." Patient went to see PCP and she states he told her that her hormones are overriding her psychotic medications which she states "I'm sure you can see a list of all of my medications. I need to take those. I can't take hormones anymore and I need someone to tell me that I am not going to get cancer from my uterine lining being thin." Patient states she had labs drawn at PCP. Requested she have labs faxed to our office for review. Fax number given to patient and patient agreeable.Patient states that she feels safe at this time but does not want to take hormones. Patient requesting to come in for office visit but states "I would like to be seen by Lauro Franklin, FNP or Verner Chol CNM. I do not want to have to tell Dr.Lathrop all of this again." Patient requesting office visit today to discuss. Appointment made for today at 2:15pm with Lauro Franklin, FNP. Patient agreeable to date and time.  CC Lauro Franklin, FNP CC Billie Ruddy, RN

## 2013-08-27 NOTE — Telephone Encounter (Signed)
Pt wants to talk with the nurse about some issues with her hormones. Pt states she does not want to see Dr. Farrel Gobble. She would like to see PG, DL or another physician.

## 2013-08-29 NOTE — Telephone Encounter (Signed)
Dr Hyacinth Meeker, Mendel Ryder and for your review.  Patient did not keep the appointment she requested.

## 2013-08-30 ENCOUNTER — Other Ambulatory Visit: Payer: Self-pay | Admitting: Obstetrics and Gynecology

## 2013-08-30 MED ORDER — ACYCLOVIR 400 MG PO TABS: 400.0000 mg | ORAL_TABLET | Freq: Three times a day (TID) | ORAL | Status: DC

## 2013-08-30 NOTE — Telephone Encounter (Signed)
Please help me to understand why she cannot take Valtrex anymore so that I can find a good alternative for her.

## 2013-08-30 NOTE — Telephone Encounter (Signed)
I will prescribe Acyclovir to the patient, but let her know that the same side effects may occur with all the antiviral medications to treat HSV infection.  Acyclovir is taken as 400 mg po tid for 5 days.  I will send this to the patient's pharmacy in case she chooses to try it.   Of course, if the patient chooses not to take anything, it will just take longer for the outbreak to resolve.

## 2013-08-30 NOTE — Telephone Encounter (Signed)
Patient is asking for some medication for an out break said she can not take valtrex.

## 2013-08-30 NOTE — Telephone Encounter (Signed)
Dr.Silva, Is there another medication we could provide patient for outbreak as she states she can not take Valtrex any more?   Routed to Dr.Silva as covering. CC Dr.Lathrop

## 2013-08-30 NOTE — Telephone Encounter (Signed)
Spoke with patient. Patient states that last time she took Valtrex she had stomach cramping, vomiting and diarrhea which she had to go to the hospital for. Patient states she was advised not to take Valtrex any longer. Advised would let provider know and call back with further medication advice/instructions. Patient agreeable.

## 2013-08-30 NOTE — Telephone Encounter (Signed)
Spoke with patient. Advised prescription for Acyclovir 400 mg tid for 5 days was sent over to patient's pharmacy. Advised same side effects may occur with all antiviral medications to treat HSV. Patient states that she would like to try this medication and see how she does. Advised patient to call back with any further needs, questions, or concerns. Patient agreeable and verbalizes understanding.   Routing to Dr.Silva CC Dr.Lathrop  Routing to provider for final review. Patient agreeable to disposition. Will close encounter.

## 2013-09-06 ENCOUNTER — Encounter: Payer: Self-pay | Admitting: Nurse Practitioner

## 2013-09-06 ENCOUNTER — Ambulatory Visit (INDEPENDENT_AMBULATORY_CARE_PROVIDER_SITE_OTHER): Payer: BC Managed Care – PPO | Admitting: Nurse Practitioner

## 2013-09-06 VITALS — BP 104/66 | HR 84 | Ht 64.5 in | Wt 162.0 lb

## 2013-09-06 DIAGNOSIS — N760 Acute vaginitis: Secondary | ICD-10-CM

## 2013-09-06 DIAGNOSIS — B9689 Other specified bacterial agents as the cause of diseases classified elsewhere: Secondary | ICD-10-CM

## 2013-09-06 DIAGNOSIS — A499 Bacterial infection, unspecified: Secondary | ICD-10-CM

## 2013-09-06 MED ORDER — METRONIDAZOLE 0.75 % VA GEL: 1.0000 | Freq: Every day | VAGINAL | Status: DC

## 2013-09-06 NOTE — Patient Instructions (Signed)

## 2013-09-06 NOTE — Progress Notes (Signed)
Subjective:     Patient ID: Erika Mccoy, female   DOB: [birth year], 48 y.o.   MRN: [id number]  HPI this 48 yo W Fe complains of vaginal symptoms for 2 days.  Vaginal odor, gel like substance.  Some irritation externally. Recent flare of IC and now that is better.  Not SA for about a month.  No fever or chills.  Some vaso symptoms. In the recent past took Provera hormones that caused her to become psychotic.  Took Provera and did have withdrawal spotting for 1 day with wiping only.    Review of Systems  Constitutional: Negative for fever, chills and fatigue.  HENT: Negative.   Respiratory: Negative.   Cardiovascular: Negative.   Gastrointestinal: Negative.  Negative for vomiting, abdominal pain, diarrhea and constipation.  Genitourinary: Positive for vaginal discharge. Negative for dysuria, urgency, frequency, flank pain, enuresis, vaginal pain, pelvic pain and dyspareunia.  Musculoskeletal: Negative.   Skin: Negative.   Neurological: Negative.   Psychiatric/Behavioral: Negative.        Objective:   Physical Exam  Constitutional: She is oriented to person, place, and time. She appears well-developed and well-nourished. No distress.  Abdominal: Soft. She exhibits no distension and no mass. There is no tenderness. There is no rebound and no guarding.  Genitourinary:  Thin white to grey vaginal discharge.  Wet Prep: Ph 5.5; NSS: + clue cells; KOH: negative. No pain on bimanual.  Urinalysis is clear.  Neurological: She is alert and oriented to person, place, and time.  Psychiatric: She has a normal mood and affect. Her behavior is normal. Judgment and thought content normal.       Assessment:     BV Bipolar disorder Psychotic event following Provera challenge Plan:     Metrogel vaginal cream HS X 5 If symptoms persist to call back

## 2013-09-08 NOTE — Progress Notes (Signed)
Encounter reviewed by Dr. Brook Silva.  

## 2013-09-11 ENCOUNTER — Telehealth: Payer: Self-pay | Admitting: Gynecology

## 2013-09-11 NOTE — Telephone Encounter (Signed)
Patient wants to know "if her medication will hurt her boyfriends penis."

## 2013-09-11 NOTE — Telephone Encounter (Signed)
i would not apply ointment immediately before sex but if it is absorbed, it soul not have an affect.  If the skin is thin, she amy want to use a lubricant like cocoanut oil, we are happy to look at the area to  Make sure she is not over applying

## 2013-09-11 NOTE — Telephone Encounter (Signed)
Spoke with patient. Message from Dr.Lathrop given as seen below. Patient agreeable and verbalizes understanding. Would like to try coconut oil as lubricant at this time. Will call back with any further needs, questions, or concerns.  Routing to provider for final review. Patient agreeable to disposition. Will close encounter

## 2013-09-11 NOTE — Telephone Encounter (Signed)
Spoke with patient. Patient states that Dr.Lathrop started her on Clobetasol and would like to know "Will Clobetasol hurt my boyfriend's penis?" Patient is not currently having intercourse with boyfriend but states "I am wondering before I try. It makes my skin thinner and it even tears sometimes. You know how men are about their penis. I want to make sure it will not cause anything negative with his well being." Patient states she started back on Clobetasol today due to itching. Requesting I check with Dr.Lathrop and give her a call back. Advised would send a message to Dr.Lathrop and call back with further instructions. Patient agreeable.

## 2013-10-08 ENCOUNTER — Emergency Department (HOSPITAL_COMMUNITY): Payer: BC Managed Care – PPO

## 2013-10-08 ENCOUNTER — Encounter (HOSPITAL_COMMUNITY): Payer: Self-pay | Admitting: Emergency Medicine

## 2013-10-08 ENCOUNTER — Emergency Department (HOSPITAL_COMMUNITY)
Admission: EM | Admit: 2013-10-08 | Discharge: 2013-10-09 | Disposition: A | Discharge status: Home / Self Care | Payer: BC Managed Care – PPO | Attending: Emergency Medicine | Admitting: Emergency Medicine

## 2013-10-08 DIAGNOSIS — Z3202 Encounter for pregnancy test, result negative: Secondary | ICD-10-CM | POA: Insufficient documentation

## 2013-10-08 DIAGNOSIS — Z87828 Personal history of other (healed) physical injury and trauma: Secondary | ICD-10-CM | POA: Insufficient documentation

## 2013-10-08 DIAGNOSIS — Z79899 Other long term (current) drug therapy: Secondary | ICD-10-CM | POA: Insufficient documentation

## 2013-10-08 DIAGNOSIS — Z872 Personal history of diseases of the skin and subcutaneous tissue: Secondary | ICD-10-CM | POA: Insufficient documentation

## 2013-10-08 DIAGNOSIS — R110 Nausea: Secondary | ICD-10-CM | POA: Insufficient documentation

## 2013-10-08 DIAGNOSIS — F319 Bipolar disorder, unspecified: Secondary | ICD-10-CM | POA: Insufficient documentation

## 2013-10-08 DIAGNOSIS — K6389 Other specified diseases of intestine: Secondary | ICD-10-CM | POA: Insufficient documentation

## 2013-10-08 DIAGNOSIS — Z8742 Personal history of other diseases of the female genital tract: Secondary | ICD-10-CM | POA: Insufficient documentation

## 2013-10-08 DIAGNOSIS — IMO0002 Reserved for concepts with insufficient information to code with codable children: Secondary | ICD-10-CM | POA: Insufficient documentation

## 2013-10-08 DIAGNOSIS — F411 Generalized anxiety disorder: Secondary | ICD-10-CM | POA: Insufficient documentation

## 2013-10-08 DIAGNOSIS — R300 Dysuria: Secondary | ICD-10-CM | POA: Insufficient documentation

## 2013-10-08 DIAGNOSIS — Z8669 Personal history of other diseases of the nervous system and sense organs: Secondary | ICD-10-CM | POA: Insufficient documentation

## 2013-10-08 DIAGNOSIS — K921 Melena: Secondary | ICD-10-CM | POA: Insufficient documentation

## 2013-10-08 DIAGNOSIS — Z8619 Personal history of other infectious and parasitic diseases: Secondary | ICD-10-CM | POA: Insufficient documentation

## 2013-10-08 LAB — COMPREHENSIVE METABOLIC PANEL
ALT: 11 U/L (ref 0–35)
AST: 12 U/L (ref 0–37)
Albumin: 3.3 g/dL — ABNORMAL LOW (ref 3.5–5.2)
Alkaline Phosphatase: 89 U/L (ref 39–117)
BUN: 10 mg/dL (ref 6–23)
CALCIUM: 8.8 mg/dL (ref 8.4–10.5)
CHLORIDE: 97 meq/L (ref 96–112)
CO2: 29 mEq/L (ref 19–32)
Creatinine, Ser: 0.62 mg/dL (ref 0.50–1.10)
GFR calc non Af Amer: >90 mL/min (ref >90)
GLUCOSE: 92 mg/dL (ref 70–99)
Potassium: 4 mEq/L (ref 3.7–5.3)
Sodium: 137 mEq/L (ref 137–147)
Total Protein: 6.9 g/dL (ref 6.0–8.3)

## 2013-10-08 LAB — LIPASE, BLOOD: Lipase: 40 U/L (ref 11–59)

## 2013-10-08 LAB — CBC WITH DIFFERENTIAL/PLATELET
Basophils Absolute: 0 10*3/uL (ref 0.0–0.1)
Basophils Relative: 0 % (ref 0–1)
EOS PCT: 1 % (ref 0–5)
Eosinophils Absolute: 0.1 10*3/uL (ref 0.0–0.7)
HEMATOCRIT: 35 % — AB (ref 36.0–46.0)
Hemoglobin: 11.8 g/dL — ABNORMAL LOW (ref 12.0–15.0)
LYMPHS ABS: 2.9 10*3/uL (ref 0.7–4.0)
LYMPHS PCT: 30 % (ref 12–46)
MCH: 30.5 pg (ref 26.0–34.0)
MCHC: 33.7 g/dL (ref 30.0–36.0)
MCV: 90.4 fL (ref 78.0–100.0)
MONO ABS: 0.6 10*3/uL (ref 0.1–1.0)
Monocytes Relative: 7 % (ref 3–12)
NEUTROS ABS: 5.9 10*3/uL (ref 1.7–7.7)
Neutrophils Relative %: 62 % (ref 43–77)
PLATELETS: 236 10*3/uL (ref 150–400)
RBC: 3.87 MIL/uL (ref 3.87–5.11)
RDW: 13.9 % (ref 11.5–15.5)
WBC: 9.5 10*3/uL (ref 4.0–10.5)

## 2013-10-08 LAB — URINALYSIS, ROUTINE W REFLEX MICROSCOPIC
Bilirubin Urine: NEGATIVE
GLUCOSE, UA: NEGATIVE mg/dL
HGB URINE DIPSTICK: NEGATIVE
Ketones, ur: NEGATIVE mg/dL
Leukocytes, UA: NEGATIVE
Nitrite: NEGATIVE
PH: 6.5 (ref 5.0–8.0)
Protein, ur: NEGATIVE mg/dL
SPECIFIC GRAVITY, URINE: 1.01 (ref 1.005–1.030)
Urobilinogen, UA: 0.2 mg/dL (ref 0.0–1.0)

## 2013-10-08 LAB — POC URINE PREG, ED: Preg Test, Ur: NEGATIVE

## 2013-10-08 MED ORDER — ONDANSETRON HCL 4 MG/2ML IJ SOLN
4.0000 mg | Freq: Once | INTRAMUSCULAR | Status: AC
  Administered 2013-10-08: 4 mg via INTRAVENOUS
  Filled 2013-10-08: qty 2

## 2013-10-08 MED ORDER — HYDROMORPHONE HCL PF 1 MG/ML IJ SOLN
1.0000 mg | Freq: Once | INTRAMUSCULAR | Status: AC
  Administered 2013-10-09: 1 mg via INTRAVENOUS
  Filled 2013-10-08: qty 1

## 2013-10-08 MED ORDER — SODIUM CHLORIDE 0.9 % IV BOLUS (SEPSIS)
500.0000 mL | Freq: Once | INTRAVENOUS | Status: AC
  Administered 2013-10-08: 500 mL via INTRAVENOUS

## 2013-10-08 MED ORDER — MORPHINE SULFATE 4 MG/ML IJ SOLN
4.0000 mg | Freq: Once | INTRAMUSCULAR | Status: AC
  Administered 2013-10-08: 4 mg via INTRAVENOUS
  Filled 2013-10-08: qty 1

## 2013-10-08 MED ORDER — IOHEXOL 300 MG/ML  SOLN
50.0000 mL | Freq: Once | INTRAMUSCULAR | Status: AC | PRN
  Administered 2013-10-08: 50 mL via ORAL

## 2013-10-08 NOTE — ED Provider Notes (Signed)
[name]: [id number]     Arrival date & time 10/08/13  2111 History   First MD Initiated Contact with Patient 10/08/13 2233     Chief Complaint  Patient presents with  . Abdominal Pain    LLQ     (Consider location/radiation/quality/duration/timing/severity/associated sxs/prior Treatment) Patient is a 48 y.o. female presenting with abdominal pain. The history is provided by the patient and the spouse.  Abdominal Pain Associated symptoms: constipation, diarrhea, dysuria and nausea   Associated symptoms: no chest pain, no shortness of breath and no vomiting    patient's had lower abdominal pain worse on the left side of the last month. His been worse over the last few days. She has a history of interstitial cystitis but states this feels differently. She has had worsening pain with bowel movements. She has had nausea without vomiting. She's had some chills without actual fever. She states she was seen at her primary care Dr. and had an x-ray done and has an elevated white count. She states that a CT scan was going to be ordered. She's had pain that has been poorly controlled. She's a history of interstitial cystitis and fibromyalgia. She's had some diarrhea and constipation, but has a history of interval bowel and states that is not unusual for her.  Past Medical History  Diagnosis Date  . Interstitial cystitis   . Fibromyalgia   . Anxiety   . Lichen sclerosus of female genitalia   . Manic depression   . HSV-2 infection     rare occurence  . MS (multiple sclerosis)   . IBS (irritable bowel syndrome)   . History of self mutilation   . Arm sprain 5/11    right   . Lichen sclerosus     Vulva   Past Surgical History  Procedure Laterality Date  . Endometrial ablation  1997/1998  . Breast enhancement surgery  1990   History reviewed. No pertinent family history. History  Substance Use Topics  . Smoking status: Never Smoker   . Smokeless tobacco: Never Used  . Alcohol Use: No   OB  History   Grav Para Term Preterm Abortions TAB SAB Ect Mult Living   2 2       1 3      Review of Systems  Constitutional: Negative for activity change and appetite change.  Eyes: Negative for pain.  Respiratory: Negative for chest tightness and shortness of breath.   Cardiovascular: Negative for chest pain and leg swelling.  Gastrointestinal: Positive for nausea, abdominal pain, diarrhea, constipation and blood in stool (occasional). Negative for vomiting.  Genitourinary: Positive for dysuria. Negative for flank pain.  Musculoskeletal: Negative for back pain and neck stiffness.  Skin: Negative for rash.  Neurological: Negative for weakness, numbness and headaches.  Psychiatric/Behavioral: Negative for behavioral problems.      Allergies  Cefzil; Ciprofloxacin; Lidocaine; Monistat; Neosporin; Quinolones; Septra; Valtrex; and Adhesive  Home Medications   Prior to Admission medications   Medication Sig Start Date End Date Taking? Authorizing Provider  bupivacaine (MARCAINE) 0.25 % injection 25 mLs by Intrapleural route.   Yes Historical Provider, MD  clobetasol ointment (TEMOVATE) 0.05 % Pea size amount to affected areas once a day 06/18/13  Yes Bennye Alm, MD  clonazePAM (KLONOPIN) 2 MG tablet Take 1-2 mg by mouth daily as needed for anxiety. 1mg  in the morning and 2mg  at night   Yes Historical Provider, MD  diazepam (DIASAT) 20 MG GEL Place 10 mg vaginally 2 (two) times daily.  Yes Historical Provider, MD  dicyclomine (BENTYL) 10 MG capsule Take 10 mg by mouth 2 (two) times daily.  05/21/13 05/21/14 Yes Historical Provider, MD  diphenhydrAMINE (BENADRYL) 25 mg capsule Take by mouth.   Yes Historical Provider, MD  docusate sodium (COLACE) 100 MG capsule Take 100 mg by mouth 4 (four) times daily as needed for mild constipation.   Yes Historical Provider, MD  fluticasone (FLONASE) 50 MCG/ACT nasal spray Place 1 spray into the nose.   Yes Historical Provider, MD   HYDROcodone-acetaminophen (NORCO) 10-325 MG per tablet Take 1 tablet by mouth every 4 (four) hours as needed (pain).   Yes Historical Provider, MD  loratadine (CLARITIN) 10 MG tablet Take 10 mg by mouth daily.   Yes Historical Provider, MD  Lurasidone HCl (LATUDA) 20 MG TABS Take 20 mg by mouth daily.   Yes Historical Provider, MD  meclizine (ANTIVERT) 25 MG tablet Take 25 mg by mouth 2 (two) times daily as needed for dizziness.   Yes Historical Provider, MD  Multiple Vitamins-Minerals (MULTIVITAMIN PO) Take 1 tablet by mouth daily.    Yes Historical Provider, MD  omeprazole (PRILOSEC OTC) 20 MG tablet Take 20 mg by mouth. 05/21/13 05/21/14 Yes Historical Provider, MD  promethazine (PHENERGAN) 25 MG tablet Take 25 mg by mouth every 6 (six) hours as needed for nausea or vomiting.   Yes Historical Provider, MD  Silicone 30 % OINT Apply 1 application topically daily. Applied to scar on face   Yes Historical Provider, MD  traZODone (DESYREL) 100 MG tablet Take 200-300 mg by mouth at bedtime.    Yes Historical Provider, MD  venlafaxine XR (EFFEXOR-XR) 75 MG 24 hr capsule Take 225 mg by mouth every morning. Take 3 daily   Yes Historical Provider, MD  Vitamin D, Ergocalciferol, (DRISDOL) 50000 UNITS CAPS capsule Take 50,000 Units by mouth every 7 (seven) days.  09/27/12  Yes Historical Provider, MD  Multiple Vitamin (THERA) TABS Take 1 tablet by mouth.    Historical Provider, MD   BP 122/85  Pulse 86  Temp(Src) 98.2 F (36.8 C) (Oral)  Resp 20  SpO2 97%  LMP 09/13/2013 Physical Exam  Nursing note and vitals reviewed. Constitutional: She is oriented to person, place, and time. She appears well-developed and well-nourished.  Eyes: Pupils are equal, round, and reactive to light.  Neck: Neck supple.  Cardiovascular: Normal rate and regular rhythm.   Pulmonary/Chest: Effort normal and breath sounds normal.  Abdominal: Soft. There is tenderness.  Moderate to exquisite tenderness in left lower quadrant.  No hernias. Also some tenderness to suprapubic and right lower quadrant, but not as severe as in the left lower quadrant.  Musculoskeletal: Normal range of motion. She exhibits no edema.  Neurological: She is alert and oriented to person, place, and time.  Skin: Skin is warm.    ED Course  Procedures (including critical care time) Labs Review Labs Reviewed  CBC WITH DIFFERENTIAL - Abnormal; Notable for the following:    Hemoglobin 11.8 (*)    HCT 35.0 (*)    All other components within normal limits  COMPREHENSIVE METABOLIC PANEL - Abnormal; Notable for the following:    Albumin 3.3 (*)    Total Bilirubin <0.2 (*)    All other components within normal limits  URINALYSIS, ROUTINE W REFLEX MICROSCOPIC  LIPASE, BLOOD  POC URINE PREG, ED    Imaging Review Ct Abdomen Pelvis W Contrast  10/09/2013   CLINICAL DATA:  Left lower quadrant abdominal pain, nausea and  vomiting.  EXAM: CT ABDOMEN AND PELVIS WITH CONTRAST  TECHNIQUE: Multidetector CT imaging of the abdomen and pelvis was performed using the standard protocol following bolus administration of intravenous contrast.  CONTRAST:  50mL OMNIPAQUE IOHEXOL 300 MG/ML SOLN, 100mL OMNIPAQUE IOHEXOL 300 MG/ML SOLN  COMPARISON:  Pelvic ultrasound performed 06/25/2013, and CT of the abdomen and pelvis performed 04/14/2009  FINDINGS: The visualized lung bases are clear. The patient's bilateral breast implants are partially imaged and appear grossly unremarkable.  The liver and spleen are unremarkable in appearance. The gallbladder is within normal limits. There is slight prominence of the pancreatic duct measuring 4-5 mm, of uncertain significance. There is no definite evidence of distal obstruction. This may reflect the patient's baseline, though it is somewhat more prominent than in 2010.  The kidneys are unremarkable in appearance. There is no evidence of hydronephrosis. No renal or ureteral stones are seen. No perinephric stranding is appreciated.  No  free fluid is identified. The small bowel is unremarkable in appearance. The stomach is within normal limits. No acute vascular abnormalities are seen.  The appendix is normal in caliber, without evidence for appendicitis. The colon is largely filled with stool and is unremarkable in appearance. This could reflect mild constipation.  Focal soft tissue inflammation is noted about a lobule of fat at the left lower quadrant, raising concern for epiploic appendagitis.  The bladder is mildly distended; a small amount of air is noted in the bladder. This may reflect recent Foley catheter placement. Would correlate clinically. The uterus is unremarkable in appearance. The ovaries are relatively symmetric; no suspicious adnexal masses are seen. No inguinal lymphadenopathy is seen.  No acute osseous abnormalities are identified.  IMPRESSION: 1. Focal soft tissue inflammation about a small lobule of fat at the left lower quadrant, raising concern for epiploic appendagitis. This likely explains the patient's symptoms. 2. Small amount of air noted in the bladder. Would correlate clinically as to whether a Foley catheter has been recently placed. 3. Colon largely filled with stool; this could reflect mild constipation. 4. Slight prominence of the pancreatic duct, 4-5 mm, of uncertain significance. No definite evidence of distal obstruction. This is somewhat more prominent than in 2010. Depending on the degree of clinical concern, MRCP could be considered for further evaluation.   Electronically Signed   By: Roanna RaiderJeffery  Chang M.D.   On: 10/09/2013 01:27     EKG Interpretation None      MDM   Final diagnoses:  Epiploic appendagitis    Patient with abdominal pain. Lab work overall reassuring. Will get CT scan care turned over to Dr. Marja KaysNananvati,    Juliet RudeNathan R. Rubin PayorPickering, MD 10/10/13 [id number]

## 2013-10-08 NOTE — ED Notes (Signed)
Patient states that previously given pain medication has not relived pain Dr. Rubin Payor made aware

## 2013-10-08 NOTE — ED Notes (Signed)
Bed: WA03 Expected date:  Expected time:  Means of arrival:  Comments: Hold for triage 1 

## 2013-10-08 NOTE — ED Notes (Signed)
Patient is alert and oriented x3.  She is complaining of LLQ pain that she has been dealing  With for about a month and has progressively gotten worse and more severe in the LLQ starting On Friday.  She adds that she has had nausea and vomiting that produces yellow bile.  Currently  She rates her pain 10 of 10.

## 2013-10-08 NOTE — ED Provider Notes (Addendum)
  Physical Exam  BP 122/85  Pulse 86  Temp(Src) 98.2 F (36.8 C) (Oral)  Resp 20  SpO2 97%  LMP 09/13/2013  Physical Exam  ED Course  Procedures  MDM  Pt with interstitial cystitis comes in with abd pain. Has progressively worsening LLQ pain, suprapubic pain. CT ordered. Will reassess.  Derwood Kaplan, MD 10/08/13 2303  Filed Vitals:   10/08/13 2228  BP: 122/85  Pulse: 86  Temp: 98.2 F (36.8 C)  Resp: 20     Derwood Kaplan, MD 10/08/13 2303

## 2013-10-09 ENCOUNTER — Encounter (HOSPITAL_COMMUNITY): Payer: Self-pay

## 2013-10-09 MED ORDER — IOHEXOL 300 MG/ML  SOLN
100.0000 mL | Freq: Once | INTRAMUSCULAR | Status: AC | PRN
  Administered 2013-10-09: 100 mL via INTRAVENOUS

## 2013-10-09 MED ORDER — HYDROMORPHONE HCL PF 1 MG/ML IJ SOLN
1.0000 mg | Freq: Once | INTRAMUSCULAR | Status: AC
  Administered 2013-10-09: 1 mg via INTRAVENOUS
  Filled 2013-10-09: qty 1

## 2013-10-09 MED ORDER — IBUPROFEN 600 MG PO TABS: 600.0000 mg | ORAL_TABLET | Freq: Four times a day (QID) | ORAL | Status: DC | PRN

## 2013-10-09 MED ORDER — OXYCODONE-ACETAMINOPHEN 5-325 MG PO TABS: 1.0000 | ORAL_TABLET | Freq: Three times a day (TID) | ORAL | Status: DC | PRN

## 2013-10-09 MED ORDER — OXYCODONE-ACETAMINOPHEN 5-325 MG PO TABS
2.0000 | ORAL_TABLET | Freq: Once | ORAL | Status: AC
  Administered 2013-10-09: 2 via ORAL
  Filled 2013-10-09: qty 2

## 2013-10-09 NOTE — ED Notes (Signed)
Dr. Nanavati at bedside 

## 2013-10-09 NOTE — ED Notes (Signed)
Patient up for DC home Patient's boyfriend on the way to ED to provide transport home due to admin of multiple pain medications, see MAR Patient alert and oriented x 4 and in NAD at time of DC

## 2013-10-09 NOTE — Discharge Instructions (Signed)
We saw you in the ER for the abdominal pain. You have EPIPLOIC APPENDAGITIS. The condition will improve in 7-10 days.  Take the medicine as prescribed. If symptoms fail to improve with conservative management, or is getting worse symptoms (eg, high fever, progressive pain, nausea, vomiting, or inability to tolerate an oral diet), come to the ER.

## 2013-10-09 NOTE — ED Notes (Signed)
Patient back from CT  Patient asking for more pain medication Will make EDP aware

## 2013-10-09 NOTE — ED Notes (Signed)
Patient states that drinking contrast increased abdominal pain Patient asking to use bathroom prior to admin of additional pain medication Will medicate when back from bathroom

## 2013-11-19 ENCOUNTER — Ambulatory Visit (INDEPENDENT_AMBULATORY_CARE_PROVIDER_SITE_OTHER): Payer: BC Managed Care – PPO | Admitting: Nurse Practitioner

## 2013-11-19 ENCOUNTER — Encounter: Payer: Self-pay | Admitting: Nurse Practitioner

## 2013-11-19 VITALS — BP 94/66 | HR 96 | Temp 98.2°F | Resp 16 | Ht 64.5 in | Wt 164.0 lb

## 2013-11-19 DIAGNOSIS — R350 Frequency of micturition: Secondary | ICD-10-CM

## 2013-11-19 DIAGNOSIS — B373 Candidiasis of vulva and vagina: Secondary | ICD-10-CM

## 2013-11-19 DIAGNOSIS — N760 Acute vaginitis: Secondary | ICD-10-CM

## 2013-11-19 DIAGNOSIS — B9689 Other specified bacterial agents as the cause of diseases classified elsewhere: Secondary | ICD-10-CM

## 2013-11-19 DIAGNOSIS — B3731 Acute candidiasis of vulva and vagina: Secondary | ICD-10-CM

## 2013-11-19 DIAGNOSIS — A499 Bacterial infection, unspecified: Secondary | ICD-10-CM

## 2013-11-19 LAB — POCT URINALYSIS DIPSTICK
Bilirubin, UA: NEGATIVE
Glucose, UA: NEGATIVE
KETONES UA: NEGATIVE
Leukocytes, UA: NEGATIVE
Nitrite, UA: NEGATIVE
PH UA: 5
PROTEIN UA: NEGATIVE
RBC UA: NEGATIVE
Urobilinogen, UA: NEGATIVE

## 2013-11-19 MED ORDER — METRONIDAZOLE 500 MG PO TABS: 500.0000 mg | ORAL_TABLET | Freq: Two times a day (BID) | ORAL | Status: DC

## 2013-11-19 MED ORDER — FLUCONAZOLE 150 MG PO TABS: 150.0000 mg | ORAL_TABLET | Freq: Once | ORAL | Status: DC

## 2013-11-19 NOTE — Progress Notes (Signed)
Subjective:     Patient ID: Erika Mccoy, female   DOB: [birth year], 48 y.o.   MRN: [id number]  Vaginal Discharge The patient's primary symptoms include a vaginal discharge. Pertinent negatives include no chills, dysuria, fever, frequency or urgency.    This 48 yo Fe complains of white vaginal discharge that was treated with Metrogel in April.  Symptoms seemed to improve but just for a short time and now back again.  She does note a change in detergents and soaps.  In addition she has taken a lot of baths.  States she is going to the beach with her daughter next week and will be in a hot tub.  She is with the same partner and he also has some irritation after SA.   Review of Systems  Constitutional: Negative for fever, chills and fatigue.  Genitourinary: Positive for vaginal discharge. Negative for dysuria, urgency, frequency, difficulty urinating, vaginal pain and dyspareunia.       Objective:   Physical Exam  Constitutional: She is oriented to person, place, and time. She appears well-developed and well-nourished.  Abdominal: Soft. She exhibits no distension. There is no tenderness. There is no rebound and no guarding.  Genitourinary:  Thin white to grey vaginal discharge. No other redness or external irritation.  Wet Prep:  PH: 5.5; NSS: + clue cells; KOH: few yeast.  Neurological: She is alert and oriented to person, place, and time.  Psychiatric: She has a normal mood and affect. Her behavior is normal. Judgment and thought content normal.       Assessment:     BV Yeast vaginitis     Plan:     She will try to do better about personal products and not get in the hot tub Started on Flagyl 500 mg BID and GI and ETOH precautions are given - extra given for refill if needed If needed with follow with Diflucan

## 2013-11-19 NOTE — Patient Instructions (Signed)
Bacterial Vaginosis Bacterial vaginosis is a vaginal infection that occurs when the normal balance of bacteria in the vagina is disrupted. It results from an overgrowth of certain bacteria. This is the most common vaginal infection in women of childbearing age. Treatment is important to prevent complications, especially in pregnant women, as it can cause a premature delivery. CAUSES  Bacterial vaginosis is caused by an increase in harmful bacteria that are normally present in smaller amounts in the vagina. Several different kinds of bacteria can cause bacterial vaginosis. However, the reason that the condition develops is not fully understood. RISK FACTORS Certain activities or behaviors can put you at an increased risk of developing bacterial vaginosis, including:  Having a new sex partner or multiple sex partners.  Douching.  Using an intrauterine device (IUD) for contraception. Women do not get bacterial vaginosis from toilet seats, bedding, swimming pools, or contact with objects around them. SIGNS AND SYMPTOMS  Some women with bacterial vaginosis have no signs or symptoms. Common symptoms include:  Grey vaginal discharge.  A fishlike odor with discharge, especially after sexual intercourse.  Itching or burning of the vagina and vulva.  Burning or pain with urination. DIAGNOSIS  Your health care provider will take a medical history and examine the vagina for signs of bacterial vaginosis. A sample of vaginal fluid may be taken. Your health care provider will look at this sample under a microscope to check for bacteria and abnormal cells. A vaginal pH test may also be done.  TREATMENT  Bacterial vaginosis may be treated with antibiotic medicines. These may be given in the form of a pill or a vaginal cream. A second round of antibiotics may be prescribed if the condition comes back after treatment.  HOME CARE INSTRUCTIONS   Only take over-the-counter or prescription medicines as  directed by your health care provider.  If antibiotic medicine was prescribed, take it as directed. Make sure you finish it even if you start to feel better.  Do not have sex until treatment is completed.  Tell all sexual partners that you have a vaginal infection. They should see their health care provider and be treated if they have problems, such as a mild rash or itching.  Practice safe sex by using condoms and only having one sex partner. SEEK MEDICAL CARE IF:   Your symptoms are not improving after 3 days of treatment.  You have increased discharge or pain.  You have a fever. MAKE SURE YOU:   Understand these instructions.  Will watch your condition.  Will get help right away if you are not doing well or get worse. FOR MORE INFORMATION  Centers for Disease Control and Prevention, Division of STD Prevention: www.cdc.gov/std American Sexual Health Association ([name]): www.ashastd.org  Document Released: 05/02/2005 Document Revised: 02/20/2013 Document Reviewed: 12/12/2012 Exit[employer] Patient Information 2015 [employer]. This information is not intended to replace advice given to you by your health care provider. Make sure you discuss any questions you have with your health care provider.  

## 2013-11-20 DIAGNOSIS — N94819 Vulvodynia, unspecified: Secondary | ICD-10-CM | POA: Insufficient documentation

## 2013-11-20 DIAGNOSIS — R5382 Chronic fatigue, unspecified: Secondary | ICD-10-CM | POA: Insufficient documentation

## 2013-11-20 DIAGNOSIS — R3915 Urgency of urination: Secondary | ICD-10-CM | POA: Insufficient documentation

## 2013-11-20 DIAGNOSIS — G43709 Chronic migraine without aura, not intractable, without status migrainosus: Secondary | ICD-10-CM | POA: Insufficient documentation

## 2013-11-20 DIAGNOSIS — K6289 Other specified diseases of anus and rectum: Secondary | ICD-10-CM | POA: Insufficient documentation

## 2013-11-20 DIAGNOSIS — G9332 Myalgic encephalomyelitis/chronic fatigue syndrome: Secondary | ICD-10-CM | POA: Insufficient documentation

## 2013-11-20 NOTE — Progress Notes (Signed)
Encounter reviewed by Dr. Brook Silva.  

## 2013-11-25 ENCOUNTER — Telehealth: Payer: Self-pay | Admitting: Certified Nurse Midwife

## 2013-11-25 ENCOUNTER — Ambulatory Visit (INDEPENDENT_AMBULATORY_CARE_PROVIDER_SITE_OTHER): Payer: BC Managed Care – PPO | Admitting: Gynecology

## 2013-11-25 ENCOUNTER — Encounter: Payer: Self-pay | Admitting: Gynecology

## 2013-11-25 VITALS — BP 108/60 | Resp 12 | Ht 64.5 in | Wt 166.0 lb

## 2013-11-25 DIAGNOSIS — N63 Unspecified lump in unspecified breast: Secondary | ICD-10-CM

## 2013-11-25 DIAGNOSIS — Z9882 Breast implant status: Secondary | ICD-10-CM

## 2013-11-25 DIAGNOSIS — N644 Mastodynia: Secondary | ICD-10-CM

## 2013-11-25 DIAGNOSIS — Z978 Presence of other specified devices: Secondary | ICD-10-CM

## 2013-11-25 NOTE — Telephone Encounter (Signed)
Visit 02/2013 patient was to have follow up in 6 months.  L Breast, pea size mass. Felt this morning. "Between top of breast and underarm, feels like a pea." requests office visit today, states anxious. Hx implants.  Scheduled office visit today at 1:30 with Dr. Farrel Gobble.  Routing to provider for final review. Patient agreeable to disposition. Will close encounter

## 2013-11-25 NOTE — Telephone Encounter (Signed)
Patient has a "sore spot on her breast". Patient would like to an appointment.

## 2013-11-25 NOTE — Progress Notes (Signed)
Patient is scheduled for Dx L Breast Mammogram and L Breast Ultrasound for 11/26/13 at 1045 at [facility]. Patient agreeable to time/date/location.

## 2013-11-25 NOTE — Progress Notes (Signed)
Subjective:     Patient ID: Erika Mccoy, female   DOB: 1965-05-22, 48 y.o.   MRN: [id number]  HPI Comments: Pt here reporting left breast tenderness that she noticed last evening.  She states that she may feel a pea size nodule as well but it is not reproducible.  Pt reports pain as sharp, radiating and deep. Pt has saline implants in for 25y Recent mammogram 1/15 normal with no mention of implant leakage    Review of Systems Per HPI    Objective:   Physical Exam  Nursing note and vitals reviewed. Constitutional: She is oriented to person, place, and time. She appears well-developed and well-nourished.  Pulmonary/Chest: Right breast exhibits no inverted nipple, no mass, no nipple discharge, no skin change and no tenderness. Left breast exhibits tenderness. Left breast exhibits no inverted nipple, no mass, no nipple discharge and no skin change.    Breasts examined supine and upright  Lymphadenopathy:    She has no axillary adenopathy.       Right axillary: No pectoral and no lateral adenopathy present.       Left axillary: No pectoral and no lateral adenopathy present.      Right: No supraclavicular adenopathy present.       Left: No supraclavicular adenopathy present.  Neurological: She is alert and oriented to person, place, and time.       Assessment:     Implants in place with breast tenderness     Plan:     Dx mammogram bl Left breast u/s If normal may want to see plastics re removal/replacement

## 2013-11-26 ENCOUNTER — Ambulatory Visit
Admission: RE | Admit: 2013-11-26 | Discharge: 2013-11-26 | Disposition: A | Discharge status: Home / Self Care | Payer: BC Managed Care – PPO | Source: Ambulatory Visit | Attending: Gynecology | Admitting: Gynecology

## 2013-11-26 ENCOUNTER — Other Ambulatory Visit: Payer: Self-pay | Admitting: Gynecology

## 2013-11-26 DIAGNOSIS — N63 Unspecified lump in unspecified breast: Secondary | ICD-10-CM

## 2013-11-26 DIAGNOSIS — Z9882 Breast implant status: Secondary | ICD-10-CM

## 2013-11-26 DIAGNOSIS — N644 Mastodynia: Secondary | ICD-10-CM

## 2013-12-13 ENCOUNTER — Telehealth: Payer: Self-pay | Admitting: Nurse Practitioner

## 2013-12-13 NOTE — Telephone Encounter (Signed)
Pt has a bacterial infection. She is scheduled for 12/16/13 with [facility] pt wanted to wait until Monday is this ok?

## 2013-12-16 ENCOUNTER — Encounter: Payer: Self-pay | Admitting: Nurse Practitioner

## 2013-12-16 ENCOUNTER — Ambulatory Visit (INDEPENDENT_AMBULATORY_CARE_PROVIDER_SITE_OTHER): Payer: BC Managed Care – PPO | Admitting: Nurse Practitioner

## 2013-12-16 VITALS — BP 110/74 | Ht 64.5 in | Wt 164.0 lb

## 2013-12-16 DIAGNOSIS — B3731 Acute candidiasis of vulva and vagina: Secondary | ICD-10-CM

## 2013-12-16 DIAGNOSIS — B373 Candidiasis of vulva and vagina: Secondary | ICD-10-CM

## 2013-12-16 MED ORDER — FLUCONAZOLE 150 MG PO TABS: ORAL_TABLET | ORAL | Status: DC

## 2013-12-16 MED ORDER — FLUCONAZOLE 150 MG PO TABS: 150.0000 mg | ORAL_TABLET | Freq: Once | ORAL | Status: DC

## 2013-12-16 NOTE — Progress Notes (Signed)
Subjective:     Patient ID: Erika Mccoy, female   DOB: Sep 25, 1965, 48 y.o.   MRN: [id number]  HPI  This 48 yo SW Fe complains of vaginal discharge and itching since yesterday.  States the last BV infection responded to Flagyl and did well.  Now most of symptoms is itching.  No urinary symptoms. No fever/ chills/ or abdominal pain.  She is seeing a different urologist for her IC and is now using Valium compounded into a capsule and inserted intravaginally twice daily for those symptoms.  She is unsure if this is causing the vaginal symptoms.  It has greatly helped with IC. No other change in partners or changes in personal products.  Upon arrival to the office she asked to be taken to a room.  She stated she was having a "melt down".  She feels overwhelmed with her bipolar disorder and says her PCP will not give her extra med's.   Review of Systems  Constitutional: Negative for fever, chills, diaphoresis and fatigue.  Respiratory: Negative.   Cardiovascular: Negative.   Gastrointestinal: Negative.   Genitourinary: Positive for vaginal discharge. Negative for dysuria, urgency, hematuria, genital sores and pelvic pain.  Musculoskeletal: Negative.   Psychiatric/Behavioral: Positive for behavioral problems, decreased concentration and agitation. Negative for suicidal ideas and self-injury. The patient is nervous/anxious and is hyperactive.        Objective:   Physical Exam  Constitutional: She is oriented to person, place, and time. She appears well-developed and well-nourished. She appears distressed.  Abdominal: Soft. She exhibits no distension. There is no tenderness. There is no rebound and no guarding.  Genitourinary:  White thick vaginal discharge.   No cervicitis. No bladder or adnexal tenderness.  Wet Prep: PH: 3.5; KOH: + yeast; NSS: negative.  Neurological: She is alert and oriented to person, place, and time.  Skin: Skin is warm and dry.  Psychiatric:  She is very visible upset  and crying.  She is concerned about her family members and how she treats them.       Assessment:     Yeast Vaginitis    Plan:     Diflucan 150 mg X 2 initially Then will do preventative treatment for a month with Diflucan 150 mg weekly X 4 By then hope is that vaginitis will be less affected by Valium intravaginally.

## 2013-12-16 NOTE — Patient Instructions (Signed)

## 2013-12-16 NOTE — Telephone Encounter (Signed)
Spoke with patient. She states that her mammogram imaging was very painful.  She has office visit here today for another reason.   Patient states she will hold off any evaluation by plastic surgery because she does not have the money to have implants replaced.  She states if Dr. Farrel GobbleLathrop feels there is a medical need for implants to be replaced for evaluated then she will do that but for now she will wait if Dr. Farrel GobbleLathrop thinks its okay.  I advised I would send message for Dr. Farrel GobbleLathrop for review. She's agreeable.

## 2013-12-16 NOTE — Telephone Encounter (Signed)
Message copied by Joeseph AmorFAST, Eleno Weimar L on Mon Dec 16, 2013  9:54 AM ------      Message from: Douglass RiversLATHROP, Kyiah Canepa      Created: Mon Dec 09, 2013  3:20 PM       Plastics, since she thought the implants should be addressed after so many years.  I doubt however placed them is in practice after 25y but we can call her if she remembers.      TL            ----- Message -----         From: Almedia Ballsracy Chenee Munns, RN         Sent: 12/06/2013   8:42 AM           To: Bennye Almracy H Lathrop, MD            I can place referral to plastics or [place] ? Just let me know who      ----- Message -----         From: Bennye Almracy H Lathrop, MD         Sent: 12/04/2013   5:27 PM           To: [facility] Triage Pool            I had suggested that pt be seen by plastics if she imaging was normal as her implants have been in for over 20y, do we make that referral for her             ------

## 2013-12-16 NOTE — Telephone Encounter (Signed)
The imaging was normal, she can wait for plastics, the tenderness seems to be at the implant muscle interface, no leakage was reported.

## 2013-12-20 NOTE — Telephone Encounter (Signed)
Spoke with patient. Advised of message as seen below from Dr.Lathrop. Patient is agreeable and verbalizes understanding. Patient would like to thank Dr.Lathrop for the great news. Advised would let Dr.Lathrop know.  Routing to provider for final review. Patient agreeable to disposition. Will close encounter

## 2013-12-22 NOTE — Progress Notes (Signed)
Encounter reviewed by Dr. Brook Silva.  

## 2014-01-15 DIAGNOSIS — G500 Trigeminal neuralgia: Secondary | ICD-10-CM | POA: Insufficient documentation

## 2014-01-19 ENCOUNTER — Emergency Department (HOSPITAL_COMMUNITY)
Admission: EM | Admit: 2014-01-19 | Discharge: 2014-01-20 | Disposition: A | Discharge status: Home / Self Care | Payer: BC Managed Care – PPO | Attending: Emergency Medicine | Admitting: Emergency Medicine

## 2014-01-19 ENCOUNTER — Encounter (HOSPITAL_COMMUNITY): Payer: Self-pay | Admitting: Emergency Medicine

## 2014-01-19 DIAGNOSIS — F411 Generalized anxiety disorder: Secondary | ICD-10-CM | POA: Diagnosis present

## 2014-01-19 DIAGNOSIS — IMO0002 Reserved for concepts with insufficient information to code with codable children: Secondary | ICD-10-CM | POA: Insufficient documentation

## 2014-01-19 DIAGNOSIS — Z87448 Personal history of other diseases of urinary system: Secondary | ICD-10-CM | POA: Insufficient documentation

## 2014-01-19 DIAGNOSIS — Z79899 Other long term (current) drug therapy: Secondary | ICD-10-CM | POA: Diagnosis not present

## 2014-01-19 DIAGNOSIS — K589 Irritable bowel syndrome without diarrhea: Secondary | ICD-10-CM | POA: Insufficient documentation

## 2014-01-19 DIAGNOSIS — F1310 Sedative, hypnotic or anxiolytic abuse, uncomplicated: Secondary | ICD-10-CM | POA: Diagnosis not present

## 2014-01-19 DIAGNOSIS — T398X1A Poisoning by other nonopioid analgesics and antipyretics, not elsewhere classified, accidental (unintentional), initial encounter: Secondary | ICD-10-CM | POA: Diagnosis present

## 2014-01-19 DIAGNOSIS — Z3202 Encounter for pregnancy test, result negative: Secondary | ICD-10-CM | POA: Insufficient documentation

## 2014-01-19 DIAGNOSIS — T394X2A Poisoning by antirheumatics, not elsewhere classified, intentional self-harm, initial encounter: Secondary | ICD-10-CM | POA: Insufficient documentation

## 2014-01-19 DIAGNOSIS — G35 Multiple sclerosis: Secondary | ICD-10-CM | POA: Insufficient documentation

## 2014-01-19 DIAGNOSIS — F1510 Other stimulant abuse, uncomplicated: Secondary | ICD-10-CM | POA: Insufficient documentation

## 2014-01-19 DIAGNOSIS — F319 Bipolar disorder, unspecified: Secondary | ICD-10-CM | POA: Insufficient documentation

## 2014-01-19 DIAGNOSIS — Z8619 Personal history of other infectious and parasitic diseases: Secondary | ICD-10-CM | POA: Insufficient documentation

## 2014-01-19 DIAGNOSIS — T50902A Poisoning by unspecified drugs, medicaments and biological substances, intentional self-harm, initial encounter: Secondary | ICD-10-CM

## 2014-01-19 DIAGNOSIS — F4310 Post-traumatic stress disorder, unspecified: Secondary | ICD-10-CM | POA: Diagnosis present

## 2014-01-19 LAB — CBC WITH DIFFERENTIAL/PLATELET
Basophils Absolute: 0 10*3/uL (ref 0.0–0.1)
Basophils Relative: 0 % (ref 0–1)
EOS ABS: 0.1 10*3/uL (ref 0.0–0.7)
Eosinophils Relative: 1 % (ref 0–5)
HCT: 39.4 % (ref 36.0–46.0)
HEMOGLOBIN: 13.6 g/dL (ref 12.0–15.0)
LYMPHS ABS: 3 10*3/uL (ref 0.7–4.0)
Lymphocytes Relative: 33 % (ref 12–46)
MCH: 30.2 pg (ref 26.0–34.0)
MCHC: 34.5 g/dL (ref 30.0–36.0)
MCV: 87.4 fL (ref 78.0–100.0)
MONOS PCT: 6 % (ref 3–12)
Monocytes Absolute: 0.6 10*3/uL (ref 0.1–1.0)
NEUTROS ABS: 5.2 10*3/uL (ref 1.7–7.7)
NEUTROS PCT: 60 % (ref 43–77)
PLATELETS: 239 10*3/uL (ref 150–400)
RBC: 4.51 MIL/uL (ref 3.87–5.11)
RDW: 12.1 % (ref 11.5–15.5)
WBC: 8.8 10*3/uL (ref 4.0–10.5)

## 2014-01-19 LAB — COMPREHENSIVE METABOLIC PANEL
ALBUMIN: 3.6 g/dL (ref 3.5–5.2)
ALK PHOS: 90 U/L (ref 39–117)
ALT: 13 U/L (ref 0–35)
AST: 16 U/L (ref 0–37)
Anion gap: 13 (ref 5–15)
BUN: 9 mg/dL (ref 6–23)
CHLORIDE: 103 meq/L (ref 96–112)
CO2: 22 mEq/L (ref 19–32)
Calcium: 9.3 mg/dL (ref 8.4–10.5)
Creatinine, Ser: 0.76 mg/dL (ref 0.50–1.10)
GFR calc Af Amer: >90 mL/min (ref >90)
GFR calc non Af Amer: >90 mL/min (ref >90)
Glucose, Bld: 91 mg/dL (ref 70–99)
POTASSIUM: 3.9 meq/L (ref 3.7–5.3)
SODIUM: 138 meq/L (ref 137–147)
Total Protein: 7.7 g/dL (ref 6.0–8.3)

## 2014-01-19 LAB — URINALYSIS, ROUTINE W REFLEX MICROSCOPIC
Glucose, UA: NEGATIVE mg/dL
Hgb urine dipstick: NEGATIVE
KETONES UR: NEGATIVE mg/dL
NITRITE: NEGATIVE
PH: 5 (ref 5.0–8.0)
Protein, ur: NEGATIVE mg/dL
SPECIFIC GRAVITY, URINE: 1.027 (ref 1.005–1.030)
Urobilinogen, UA: 0.2 mg/dL (ref 0.0–1.0)

## 2014-01-19 LAB — RAPID URINE DRUG SCREEN, HOSP PERFORMED
Amphetamines: POSITIVE — AB
BARBITURATES: NOT DETECTED
Benzodiazepines: POSITIVE — AB
Cocaine: NOT DETECTED
OPIATES: NOT DETECTED
Tetrahydrocannabinol: NOT DETECTED

## 2014-01-19 LAB — SALICYLATE LEVEL: Salicylate Lvl: <2 mg/dL — ABNORMAL LOW (ref 2.8–20.0)

## 2014-01-19 LAB — URINE MICROSCOPIC-ADD ON

## 2014-01-19 LAB — ACETAMINOPHEN LEVEL
Acetaminophen (Tylenol), Serum: <15 ug/mL (ref 10–30)
Acetaminophen (Tylenol), Serum: <15 ug/mL (ref 10–30)

## 2014-01-19 LAB — ETHANOL

## 2014-01-19 LAB — POC URINE PREG, ED: Preg Test, Ur: NEGATIVE

## 2014-01-19 MED ORDER — TRAZODONE HCL 100 MG PO TABS
200.0000 mg | ORAL_TABLET | Freq: Every day | ORAL | Status: DC
  Administered 2014-01-19: 200 mg via ORAL
  Filled 2014-01-19: qty 2

## 2014-01-19 MED ORDER — HYDROXYZINE HCL 25 MG PO TABS: 50.0000 mg | ORAL_TABLET | Freq: Every evening | ORAL | Status: DC | PRN

## 2014-01-19 MED ORDER — LIDOCAINE HCL 2 % EX GEL
CUTANEOUS | Status: AC
  Administered 2014-01-19: 1 via TOPICAL
  Filled 2014-01-19: qty 10

## 2014-01-19 MED ORDER — SODIUM CHLORIDE 0.9 % IV BOLUS (SEPSIS)
1000.0000 mL | INTRAVENOUS | Status: AC
  Administered 2014-01-19: 1000 mL via INTRAVENOUS

## 2014-01-19 MED ORDER — ONDANSETRON HCL 4 MG/2ML IJ SOLN
4.0000 mg | Freq: Once | INTRAMUSCULAR | Status: AC
  Administered 2014-01-19: 4 mg via INTRAVENOUS
  Filled 2014-01-19: qty 2

## 2014-01-19 MED ORDER — IBUPROFEN 200 MG PO TABS: 600.0000 mg | ORAL_TABLET | Freq: Three times a day (TID) | ORAL | Status: DC | PRN

## 2014-01-19 MED ORDER — ACETAMINOPHEN 325 MG PO TABS
650.0000 mg | ORAL_TABLET | ORAL | Status: DC | PRN
  Filled 2014-01-19: qty 2

## 2014-01-19 MED ORDER — TOPIRAMATE 25 MG PO TABS
25.0000 mg | ORAL_TABLET | Freq: Two times a day (BID) | ORAL | Status: DC
  Administered 2014-01-19 – 2014-01-20 (×2): 25 mg via ORAL
  Filled 2014-01-19 (×2): qty 1

## 2014-01-19 MED ORDER — HYDROXYZINE HCL 25 MG PO TABS: 50.0000 mg | ORAL_TABLET | Freq: Three times a day (TID) | ORAL | Status: DC | PRN

## 2014-01-19 MED ORDER — DICYCLOMINE HCL 10 MG PO CAPS
10.0000 mg | ORAL_CAPSULE | Freq: Two times a day (BID) | ORAL | Status: DC | PRN
  Administered 2014-01-19: 10 mg via ORAL
  Filled 2014-01-19: qty 1

## 2014-01-19 MED ORDER — LIDOCAINE HCL 2 % EX GEL
1.0000 "application " | Freq: Once | CUTANEOUS | Status: AC
  Administered 2014-01-19: 1 via TOPICAL

## 2014-01-19 MED ORDER — ALUM & MAG HYDROXIDE-SIMETH 200-200-20 MG/5ML PO SUSP: 30.0000 mL | ORAL | Status: DC | PRN

## 2014-01-19 MED ORDER — ACTIDOSE WITH SORBITOL 50 GM/240ML PO LIQD
50.0000 g | Freq: Once | ORAL | Status: AC
  Administered 2014-01-19: 50 g via ORAL
  Filled 2014-01-19: qty 240

## 2014-01-19 MED ORDER — PENTOSAN POLYSULFATE SODIUM 100 MG PO CAPS
100.0000 mg | ORAL_CAPSULE | Freq: Two times a day (BID) | ORAL | Status: DC
  Administered 2014-01-20: 100 mg via ORAL
  Filled 2014-01-19 (×3): qty 1

## 2014-01-19 MED ORDER — ONDANSETRON HCL 4 MG PO TABS: 4.0000 mg | ORAL_TABLET | Freq: Three times a day (TID) | ORAL | Status: DC | PRN

## 2014-01-19 MED ORDER — ZOLPIDEM TARTRATE 10 MG PO TABS: 10.0000 mg | ORAL_TABLET | Freq: Every evening | ORAL | Status: DC | PRN

## 2014-01-19 MED ORDER — LURASIDONE HCL 20 MG PO TABS: 20.0000 mg | ORAL_TABLET | Freq: Every day | ORAL | Status: DC

## 2014-01-19 MED ORDER — OMEPRAZOLE MAGNESIUM 20 MG PO TBEC: 20.0000 mg | DELAYED_RELEASE_TABLET | Freq: Every day | ORAL | Status: DC

## 2014-01-19 MED ORDER — NICOTINE 21 MG/24HR TD PT24
21.0000 mg | MEDICATED_PATCH | Freq: Every day | TRANSDERMAL | Status: DC
  Filled 2014-01-19: qty 1

## 2014-01-19 MED ORDER — BISMUTH SUBSALICYLATE 262 MG/15ML PO SUSP: 30.0000 mL | Freq: Four times a day (QID) | ORAL | Status: DC | PRN

## 2014-01-19 MED ORDER — ALPRAZOLAM 0.5 MG PO TABS: 0.5000 mg | ORAL_TABLET | Freq: Two times a day (BID) | ORAL | Status: DC | PRN

## 2014-01-19 MED ORDER — PRAZOSIN HCL 5 MG PO CAPS
6.0000 mg | ORAL_CAPSULE | Freq: Every day | ORAL | Status: DC
  Filled 2014-01-19 (×2): qty 1

## 2014-01-19 MED ORDER — PANTOPRAZOLE SODIUM 40 MG PO TBEC
40.0000 mg | DELAYED_RELEASE_TABLET | Freq: Every day | ORAL | Status: DC
  Administered 2014-01-20: 40 mg via ORAL
  Filled 2014-01-19: qty 1

## 2014-01-19 MED ORDER — CLONAZEPAM 0.5 MG PO TABS
2.0000 mg | ORAL_TABLET | Freq: Every day | ORAL | Status: DC
  Administered 2014-01-19 – 2014-01-20 (×2): 2 mg via ORAL
  Filled 2014-01-19 (×2): qty 4

## 2014-01-19 NOTE — ED Notes (Addendum)
Pt. Belongings are white sandals, black shorts, white tee shirt, white Iphone 6 plus with a  grey and white cover. Pt had a brown necklace with a round faded pendant, silver necklace that was faded, another silver necklace that had white beads all round it. Pt. Had a a gold dolphin ring,  A silver ring that was given to her from her dad, had three three silver bracelets,  A brown and turquoise necklace that was used as a bracelet. Pt. Fiance came to get pt. Belongings to take with him. Nurse was notified.

## 2014-01-19 NOTE — ED Notes (Signed)
Pt. Requested to have a in and out cath done by herself. In and out cath was opened by the tech with sterile gloves on to help set up for the pt. Pt. Was asked to put on gloves that were sterile. Pt. Stated "I'm not putting them on, I always do this without it." Pt. Didn't want to take the tube out until the lidocaine was injected into the tube for a Lamar Benes) so that her vagina wouldn't stand the pain. Nurses were notified.

## 2014-01-19 NOTE — ED Notes (Signed)
Pt left at change of shift after overdose of tramadol. Pt reports some sleepiness and dryness of her lips and tongue. Patient states she her mother had an argument just about the time she took her overdose. She states her mother yells at her. She states she did not know why her mother yelled at her today although her mother was instrumental in having the patient break up from her boyfriend who the mother did not think treated her well. Patient also requesting to do a Marcaine bladder infusion for her interstitial cystitis  Patient is sleeping and easily arousable. Her lips are dry, however patient is treating fluids when she is awake.  Poison control had recommended observation for 6 hours to determine if she is medically clear. The 6 hours will be at 8 PM.  19:25 4 hour acetaminophen level is subtherapeutic.   PT has been evaluated by TSS who wants her to be reassessed in the am   Devoria Albe, MD, Franz Dell, MD 01/19/14 2258

## 2014-01-19 NOTE — ED Notes (Signed)
MD at bedside. 

## 2014-01-19 NOTE — ED Provider Notes (Signed)
[name]: [id number]     Arrival date & time 01/19/14  1356 History   First MD Initiated Contact with Patient 01/19/14 1402     Chief Complaint  Patient presents with  . Drug Overdose     (Consider location/radiation/quality/duration/timing/severity/associated sxs/prior Treatment) Patient is a 48 y.o. female presenting with Overdose. The history is provided by the patient.  Drug Overdose This is a new problem. Episode onset: 10 min ago. Episode frequency: once. The problem has not changed since onset.Pertinent negatives include no chest pain, no abdominal pain, no headaches and no shortness of breath. Exacerbated by: breakup w/ her partner. Nothing relieves the symptoms. She has tried nothing for the symptoms. The treatment provided no relief.    Past Medical History  Diagnosis Date  . Interstitial cystitis   . Fibromyalgia   . Anxiety   . Lichen sclerosus of female genitalia   . Manic depression   . HSV-2 infection     rare occurence  . MS (multiple sclerosis)   . IBS (irritable bowel syndrome)   . History of self mutilation   . Arm sprain 5/11    right   . Lichen sclerosus     Vulva   Past Surgical History  Procedure Laterality Date  . Endometrial ablation  1997/1998  . Breast enhancement surgery  1990   No family history on file. History  Substance Use Topics  . Smoking status: Never Smoker   . Smokeless tobacco: Never Used  . Alcohol Use: No   OB History   Grav Para Term Preterm Abortions TAB SAB Ect Mult Living   2 2       1 3      Review of Systems  Constitutional: Negative for fever and fatigue.  HENT: Negative for congestion and drooling.   Eyes: Negative for pain.  Respiratory: Negative for cough and shortness of breath.   Cardiovascular: Negative for chest pain.  Gastrointestinal: Negative for nausea, vomiting, abdominal pain and diarrhea.  Genitourinary: Negative for dysuria and hematuria.  Musculoskeletal: Negative for back pain, gait problem and neck  pain.  Skin: Negative for color change.  Neurological: Negative for dizziness and headaches.  Hematological: Negative for adenopathy.  Psychiatric/Behavioral: Positive for suicidal ideas and self-injury.  All other systems reviewed and are negative.     Allergies  Cefzil; Ciprofloxacin; Lidocaine; Monistat; Neosporin; Quinolones; Septra; Valtrex; and Adhesive  Home Medications   Prior to Admission medications   Medication Sig Start Date End Date Taking? Authorizing Provider  acetaminophen-codeine (TYLENOL #4) 300-60 MG per tablet Take 1 tablet by mouth every 8 (eight) hours as needed for moderate pain.    Historical Provider, MD  bismuth subsalicylate (PEPTO BISMOL) 262 MG/15ML suspension Take 30 mLs by mouth every 6 (six) hours as needed.    Historical Provider, MD  bupivacaine (MARCAINE) 0.25 % injection 25 mLs by Intrapleural route as needed.     Historical Provider, MD  clobetasol ointment (TEMOVATE) 0.05 % Pea size amount to affected areas once a day 06/18/13   Bennye Alm, MD  clonazePAM (KLONOPIN) 2 MG tablet Take 1-2 mg by mouth daily as needed for anxiety. 1mg  in the morning and 2mg  at night    Historical Provider, MD  diazepam (DIASAT) 20 MG GEL Place 10 mg vaginally 2 (two) times daily.    Historical Provider, MD  dicyclomine (BENTYL) 10 MG capsule Take 10 mg by mouth 2 (two) times daily.  05/21/13 05/21/14  Historical Provider, MD  diphenhydrAMINE (BENADRYL) 25 mg  capsule Take by mouth as needed.     Historical Provider, MD  Docusate Calcium (STOOL SOFTENER PO) Take by mouth.    Historical Provider, MD  fluconazole (DIFLUCAN) 150 MG tablet Take 1 tablet (150 mg total) by mouth once. Take one tablet.  Repeat in 48 hours if symptoms are not completely resolved. 12/16/13   Elease Hashimoto Rolen-Grubb, FNP  fluticasone (FLONASE) 50 MCG/ACT nasal spray Place 1 spray into the nose.    Historical Provider, MD  hydrOXYzine (ATARAX/VISTARIL) 50 MG tablet Take 50 mg by mouth 3 (three) times daily  as needed.    Historical Provider, MD  loratadine (CLARITIN) 10 MG tablet Take 10 mg by mouth daily.    Historical Provider, MD  Lurasidone HCl (LATUDA) 20 MG TABS Take 20 mg by mouth daily.    Historical Provider, MD  montelukast (SINGULAIR) 10 MG tablet Take 10 mg by mouth at bedtime.    Historical Provider, MD  Multiple Vitamin (THERA) TABS Take 1 tablet by mouth.    Historical Provider, MD  omeprazole (PRILOSEC OTC) 20 MG tablet Take 20 mg by mouth. 05/21/13 05/21/14  Historical Provider, MD  pentosan polysulfate (ELMIRON) 100 MG capsule Take 100 mg by mouth 2 (two) times daily.    Historical Provider, MD  Polyethylene Glycol 3350 (MIRALAX PO) Take by mouth.    Historical Provider, MD  prazosin (MINIPRESS) 1 MG capsule Take 1 mg by mouth as directed.    Historical Provider, MD  Silicone 30 % OINT Apply 1 application topically daily. Applied to scar on face    Historical Provider, MD  traZODone (DESYREL) 100 MG tablet Take 200 mg by mouth at bedtime.     Historical Provider, MD  Venlafaxine HCl (EFFEXOR XR PO) Take 225 mg by mouth.    Historical Provider, MD   There were no vitals taken for this visit. Physical Exam  Nursing note and vitals reviewed. Constitutional: She is oriented to person, place, and time. She appears well-developed and well-nourished.  HENT:  Head: Normocephalic.  Mouth/Throat: Oropharynx is clear and moist. No oropharyngeal exudate.  Eyes: Conjunctivae and EOM are normal. Pupils are equal, round, and reactive to light.  Neck: Normal range of motion. Neck supple.  Cardiovascular: Normal rate, regular rhythm, normal heart sounds and intact distal pulses.  Exam reveals no gallop and no friction rub.   No murmur heard. Pulmonary/Chest: Effort normal and breath sounds normal. No respiratory distress. She has no wheezes.  Abdominal: Soft. Bowel sounds are normal. There is no tenderness. There is no rebound and no guarding.  Musculoskeletal: Normal range of motion. She  exhibits no edema and no tenderness.  Neurological: She is alert and oriented to person, place, and time.  Skin: Skin is warm and dry.  Psychiatric: She exhibits a depressed mood. She expresses suicidal ideation.  Tearful.     ED Course  Procedures (including critical care time) Labs Review Labs Reviewed  COMPREHENSIVE METABOLIC PANEL - Abnormal; Notable for the following:    Total Bilirubin <0.2 (*)    All other components within normal limits  SALICYLATE LEVEL - Abnormal; Notable for the following:    Salicylate Lvl <2.0 (*)    All other components within normal limits  URINALYSIS, ROUTINE W REFLEX MICROSCOPIC - Abnormal; Notable for the following:    APPearance CLOUDY (*)    Bilirubin Urine SMALL (*)    Leukocytes, UA TRACE (*)    All other components within normal limits  URINE RAPID DRUG SCREEN (HOSP PERFORMED) -  Abnormal; Notable for the following:    Benzodiazepines POSITIVE (*)    Amphetamines POSITIVE (*)    All other components within normal limits  URINE MICROSCOPIC-ADD ON - Abnormal; Notable for the following:    Squamous Epithelial / LPF FEW (*)    Bacteria, UA MANY (*)    All other components within normal limits  CBC WITH DIFFERENTIAL  ACETAMINOPHEN LEVEL  ETHANOL  ACETAMINOPHEN LEVEL  POC URINE PREG, ED    Imaging Review No results found.   EKG Interpretation None      Date: 01/19/2014  Rate: 100  Rhythm: sinus tachycardia  QRS Axis: normal  Intervals: normal  ST/T Wave abnormalities: normal  Conduction Disutrbances:none  Narrative Interpretation: no significant change  Old EKG Reviewed: unchanged    MDM   Final diagnoses:  Overdose, intentional self-harm, initial encounter    2:08 PM 48 y.o. female w hx of manic depression, self mutilation who pw OD. She states that she took him approximately 60 tablets of 50 mg tramadol at around 1:50 PM this afternoon. She denies any coingestants. She states that she has a history of self-mutilation and  has cut her ankles and wrists recently. There are old-appearing abrasions to her wrists and ankles. She states that she attempted to her herself due to a recent breakup with her partner. She is afebrile and vital signs are unremarkable here. Will get screening labs. I discussed the case with poison control who recommends monitoring for 6 hours. Will give activated charcoal.   4:04 PM Pt continues to appear well. Will obs as recommended by poison control. Pt willing to stay voluntarily. Will place in psych hold after period of observation. Pt will be medically cleared at that time. Care transferred to Dr. Lynelle Doctor while pt is being observed.    Purvis Sheffield, MD 01/20/14 [phone]

## 2014-01-19 NOTE — ED Notes (Addendum)
Pt reports that 10 minutes prior to arrival she took 60 pills of tramadol, unknown dose. Pt states she took them and drove to the emergency room, pt lives on [address]. Pt is currently A/O x4. Pt states that she took these because of relationship issues. Pt states that she has not used any other drugs or drinking alcohol. Pt states that she has been off her usual medications for two weeks, that's when her boyfriend of five years told her that they were breaking up. Pt states he has a history of anxiety and bipolar. Pt reports using self mutilation, and has cut her ankles and wrists, last on yesterday,no new cuts, areas appear to be healing well.

## 2014-01-19 NOTE — ED Notes (Signed)
Pt crying, upset due to "argument with mother over new puppy I bought to keep my mind off my problems" pt given meds as ordered for nausea.

## 2014-01-19 NOTE — BH Assessment (Signed)
Assessment Note  Erika Mccoy is an 48 y.o. female. Patient came into the ED voluntary because suicidal ideation with plan to overdose.  Patient reported taking 60 tramadol pills 10 minutes prior to arriving at the emergency department.  Patient reports her current stressors includes the ending of a 5 year relationship and conflict with her mother.  Patient reports that her boyfriend left the home a week ago but today is the day he was coming to move his belongings out.  She reports the thought of him moving was overwhelming so she called her mother for comfort. She reports that he mother was not supportive and this contributed to her taking the medications.  Patient reports that she stopped taking her prescribed medications about a week ago for no real reason.  Patient reports that if you is sent back out in the world to deal with her problem then she will kill herself.  Patient is a cutter and has a superficial cut to the left wrist and left ankle.  She reports that she started self mutilating in 2004, first attempt was burning self with the oven.  Patient denies having any current supports and refused to provide contact information for her adult children.    Patient receives therapy with Dr. Nolen Mu at [facility] and medication management with Dr. Ann Maki office.  Patient reports 3 previous suicide attempts.  CSW ran patient with [place], NP it is recommended to re-evaluate in the morning by psychiatrist.    Axis I: Bipolar Disorder Axis II: Deferred Axis III:  Past Medical History  Diagnosis Date  . Interstitial cystitis   . Fibromyalgia   . Anxiety   . Lichen sclerosus of female genitalia   . Manic depression   . HSV-2 infection     rare occurence  . MS (multiple sclerosis)   . IBS (irritable bowel syndrome)   . History of self mutilation   . Arm sprain 5/11    right   . Lichen sclerosus     Vulva   Axis IV: other psychosocial or environmental problems, problems related to  social environment, problems with access to health care services and problems with primary support group Axis V: 41-50 serious symptoms   Past Medical History:  Past Medical History  Diagnosis Date  . Interstitial cystitis   . Fibromyalgia   . Anxiety   . Lichen sclerosus of female genitalia   . Manic depression   . HSV-2 infection     rare occurence  . MS (multiple sclerosis)   . IBS (irritable bowel syndrome)   . History of self mutilation   . Arm sprain 5/11    right   . Lichen sclerosus     Vulva    Past Surgical History  Procedure Laterality Date  . Endometrial ablation  1997/1998  . Breast enhancement surgery  1990    Family History: No family history on file.  Social History:  reports that she has never smoked. She has never used smokeless tobacco. She reports that she does not drink alcohol or use illicit drugs.  Additional Social History:     CIWA: CIWA-Ar BP: 113/74 mmHg COWS:    Allergies:  Allergies  Allergen Reactions  . Cefzil [Cefprozil] Hives  . Ciprofloxacin     Small vessel vasculitis Unknown (Vasculitis)  . Lidocaine     Ointment caused burning.  . Monistat [Miconazole]   . Neosporin [Neomycin-Bacitracin Zn-Polymyx] Swelling    Hot  . Quinolones     Vasculitis  with Levaquin and Cipro  . Septra [Sulfamethoxazole-Trimethoprim] Hives  . Valtrex [Valacyclovir Hcl]     Vomiting, diarrhea, and abdominal cramping.   . Adhesive [Tape] Rash and Other (See Comments)    Pulls skin off    Home Medications:  (Not in a hospital admission)  OB/GYN Status:  No LMP recorded. Patient has had an ablation.  General Assessment Data Location of Assessment: [facility] ACT Assessment: Yes Is this a Tele or Face-to-Face Assessment?: Face-to-Face Is this an Initial Assessment or a Re-assessment for this encounter?: Initial Assessment Living Arrangements: Alone Can pt return to current living arrangement?: Yes Admission Status: Voluntary Is patient capable  of signing voluntary admission?: Yes Transfer from: Home Referral Source: Self/Family/Friend  Medical Screening Exam [facility] ONLY) Medical Exam completed: Yes  [facility] Crisis Care Plan Living Arrangements: Alone Name of Psychiatrist: Dr. Emerson Monte Name of Therapist: Dr. Farrel Demark     Risk to self with the past 6 months Suicidal Ideation: Yes-Currently Present Suicidal Intent: Yes-Currently Present Is patient at risk for suicide?: Yes Suicidal Plan?: Yes-Currently Present Specify Current Suicidal Plan: overdose Access to Means: Yes Specify Access to Suicidal Means: personal medications What has been your use of drugs/alcohol within the last 12 months?: none Previous Attempts/Gestures: Yes How many times?: 3 Other Self Harm Risks: cutting Triggers for Past Attempts: Family contact Intentional Self Injurious Behavior: Cutting Comment - Self Injurious Behavior: cutting Family Suicide History: No Recent stressful life event(s): Conflict (Comment);Loss (Comment) Persecutory voices/beliefs?: No Depression: Yes Depression Symptoms: Tearfulness;Isolating;Loss of interest in usual pleasures;Feeling angry/irritable;Feeling worthless/self pity Substance abuse history and/or treatment for substance abuse?: No  Risk to Others within the past 6 months Homicidal Ideation: No-Not Currently/Within Last 6 Months Thoughts of Harm to Others: No-Not Currently Present/Within Last 6 Months Current Homicidal Intent: No-Not Currently/Within Last 6 Months Current Homicidal Plan: No-Not Currently/Within Last 6 Months Assessment of Violence: None Noted Criminal Charges Pending?: No  Psychosis Hallucinations: None noted Delusions: None noted  Mental Status Report Appear/Hygiene: In hospital gown Eye Contact: Fair Motor Activity: Freedom of movement Speech: Logical/coherent Level of Consciousness: Alert Mood: Depressed Affect: Depressed Anxiety Level: Minimal Thought Processes:  Coherent Judgement: Unimpaired Orientation: Person;Place;Time;Situation;Appropriate for developmental age Obsessive Compulsive Thoughts/Behaviors: None  Cognitive Functioning Concentration: Fair Memory: Recent Intact;Remote Intact IQ: Average Insight: Fair Impulse Control: Fair Appetite: Fair Sleep: No Change Vegetative Symptoms: None  ADLScreening [facility] Assessment Services) Patient's cognitive ability adequate to safely complete daily activities?: Yes Patient able to express need for assistance with ADLs?: Yes Independently performs ADLs?: Yes (appropriate for developmental age)  Prior Inpatient Therapy Prior Inpatient Therapy: Yes Prior Therapy Dates: 2012, 2004 Prior Therapy Facilty/Provider(s): [facility] Reason for Treatment: SI  Prior Outpatient Therapy Prior Outpatient Therapy: Yes Prior Therapy Dates: 2015 Prior Therapy Facilty/Provider(s): Dr. Greig Castilla [facility] Reason for Treatment: SI  ADL Screening (condition at time of admission) Patient's cognitive ability adequate to safely complete daily activities?: Yes Patient able to express need for assistance with ADLs?: Yes Independently performs ADLs?: Yes (appropriate for developmental age)         Values / Beliefs Cultural Requests During Hospitalization: None Spiritual Requests During Hospitalization: None   Advance Directives (For Healthcare) Does patient have an advance directive?: No    Additional Information 1:1 In Past 12 Months?: No CIRT Risk: No Elopement Risk: No Does patient have medical clearance?: Yes     Disposition:  Disposition Initial Assessment Completed for this Encounter: Yes Disposition of Patient: Inpatient treatment program Type of inpatient  treatment program: Adult  On Site Evaluation by:   Reviewed with Physician:    Maryelizabeth Rowan A 01/19/2014 5:47 PM

## 2014-01-20 ENCOUNTER — Encounter (HOSPITAL_COMMUNITY): Payer: Self-pay | Admitting: Psychiatry

## 2014-01-20 DIAGNOSIS — F411 Generalized anxiety disorder: Secondary | ICD-10-CM

## 2014-01-20 DIAGNOSIS — F3130 Bipolar disorder, current episode depressed, mild or moderate severity, unspecified: Secondary | ICD-10-CM

## 2014-01-20 DIAGNOSIS — T50902A Poisoning by unspecified drugs, medicaments and biological substances, intentional self-harm, initial encounter: Secondary | ICD-10-CM

## 2014-01-20 DIAGNOSIS — T50901A Poisoning by unspecified drugs, medicaments and biological substances, accidental (unintentional), initial encounter: Secondary | ICD-10-CM

## 2014-01-20 MED ORDER — FLUTICASONE PROPIONATE 50 MCG/ACT NA SUSP
1.0000 | Freq: Every day | NASAL | Status: DC | PRN
  Filled 2014-01-20: qty 16

## 2014-01-20 MED ORDER — PENTOSAN POLYSULFATE SODIUM 100 MG PO CAPS: 100.0000 mg | ORAL_CAPSULE | ORAL | Status: DC

## 2014-01-20 MED ORDER — TOPIRAMATE 25 MG PO TABS: 25.0000 mg | ORAL_TABLET | Freq: Two times a day (BID) | ORAL | Status: DC

## 2014-01-20 MED ORDER — DICYCLOMINE HCL 10 MG PO CAPS: 10.0000 mg | ORAL_CAPSULE | Freq: Two times a day (BID) | ORAL | Status: DC

## 2014-01-20 MED ORDER — CLONAZEPAM 2 MG PO TABS: 2.0000 mg | ORAL_TABLET | Freq: Every day | ORAL | Status: DC

## 2014-01-20 MED ORDER — MONTELUKAST SODIUM 10 MG PO TABS: 10.0000 mg | ORAL_TABLET | ORAL | Status: DC

## 2014-01-20 MED ORDER — MONTELUKAST SODIUM 10 MG PO TABS
10.0000 mg | ORAL_TABLET | Freq: Every day | ORAL | Status: DC
  Administered 2014-01-20: 10 mg via ORAL
  Filled 2014-01-20: qty 1

## 2014-01-20 MED ORDER — OMEPRAZOLE MAGNESIUM 20 MG PO TBEC: 20.0000 mg | DELAYED_RELEASE_TABLET | Freq: Every day | ORAL | Status: DC

## 2014-01-20 MED ORDER — LAMOTRIGINE 25 MG PO TABS: 25.0000 mg | ORAL_TABLET | Freq: Every day | ORAL | Status: DC

## 2014-01-20 MED ORDER — TRAZODONE HCL 100 MG PO TABS: 200.0000 mg | ORAL_TABLET | Freq: Every day | ORAL | Status: DC

## 2014-01-20 MED ORDER — LAMOTRIGINE 25 MG PO TABS
25.0000 mg | ORAL_TABLET | Freq: Every day | ORAL | Status: DC
  Administered 2014-01-20: 25 mg via ORAL
  Filled 2014-01-20: qty 1

## 2014-01-20 MED ORDER — VENLAFAXINE HCL ER 75 MG PO CP24
75.0000 mg | ORAL_CAPSULE | Freq: Every day | ORAL | Status: DC
  Filled 2014-01-20: qty 1

## 2014-01-20 MED ORDER — POLYETHYLENE GLYCOL 3350 17 G PO PACK
17.0000 g | PACK | Freq: Every day | ORAL | Status: DC
  Administered 2014-01-20: 17 g via ORAL
  Filled 2014-01-20 (×2): qty 1

## 2014-01-20 MED ORDER — PRAZOSIN HCL 2 MG PO CAPS: 6.0000 mg | ORAL_CAPSULE | Freq: Every day | ORAL | Status: DC

## 2014-01-20 MED ORDER — HYDROXYZINE HCL 25 MG PO TABS: 25.0000 mg | ORAL_TABLET | Freq: Every evening | ORAL | Status: DC | PRN

## 2014-01-20 MED ORDER — WHITE PETROLATUM GEL: Status: DC | PRN

## 2014-01-20 MED ORDER — VENLAFAXINE HCL ER 75 MG PO CP24: 75.0000 mg | ORAL_CAPSULE | Freq: Every day | ORAL | Status: DC

## 2014-01-20 MED ORDER — ZOLPIDEM TARTRATE 5 MG PO TABS: 5.0000 mg | ORAL_TABLET | Freq: Every evening | ORAL | Status: DC | PRN

## 2014-01-20 NOTE — ED Notes (Signed)
Pt denies SI/HI/AVH.  Pt states, "I'm at peace here.  This is the most peace I've had in a long time.  I just need to get with my people.  They help me get on track when I get like this.  They refer me to [facility] and they get me back on my meds."

## 2014-01-20 NOTE — [facility] Suicide Risk Assessment (Signed)
Suicide Risk Assessment  Discharge Assessment     Demographic Factors:  Caucasian  Total Time spent with patient: 20 minutes  Psychiatric Specialty Exam:     Blood pressure 117/75, pulse 77, temperature 98.1 F (36.7 C), temperature source Oral, resp. rate 18, SpO2 100.00%.There is no weight on file to calculate BMI.  General Appearance: Casual  Eye Contact::  Good  Speech:  Normal Rate  Volume:  Normal  Mood:  Anxious  Affect:  Congruent  Thought Process:  Coherent  Orientation:  Full (Time, Place, and Person)  Thought Content:  WDL  Suicidal Thoughts:  No  Homicidal Thoughts:  No  Memory:  Immediate;   Good Recent;   Good Remote;   Good  Judgement:  Fair  Insight:  Fair  Psychomotor Activity:  Normal  Concentration:  Good  Recall:  Good  Fund of Knowledge:Good  Language: Good  Akathisia:  No  Handed:  Right  AIMS (if indicated):     Assets:  Communication Skills Desire for Improvement Financial Resources/Insurance Housing Leisure Time Resilience Social Support Transportation  Sleep:      Musculoskeletal: Strength & Muscle Tone: within normal limits Gait & Station: normal Patient leans: N/A  Mental Status Per Nursing Assessment::   On Admission:   Overdose  Current Mental Status by Physician: NA  Loss Factors: NA  Historical Factors: Impulsivity  Risk Reduction Factors:   Sense of responsibility to family, Living with another person, especially a relative, Positive social support, Positive therapeutic relationship and Positive coping skills or problem solving skills  Continued Clinical Symptoms:  anxiety  Cognitive Features That Contribute To Risk:  None  Suicide Risk:  Minimal: No identifiable suicidal ideation.  Patients presenting with no risk factors but with morbid ruminations; may be classified as minimal risk based on the severity of the depressive symptoms  Discharge Diagnoses:   AXIS I:  Anxiety Disorder NOS and Bipolar,  Depressed AXIS II:  Deferred AXIS III:   Past Medical History  Diagnosis Date  . Interstitial cystitis   . Fibromyalgia   . Anxiety   . Lichen sclerosus of female genitalia   . Manic depression   . HSV-2 infection     rare occurence  . MS (multiple sclerosis)   . IBS (irritable bowel syndrome)   . History of self mutilation   . Arm sprain 5/11    right   . Lichen sclerosus     Vulva   AXIS IV:  other psychosocial or environmental problems, problems related to social environment and problems with primary support group AXIS V:  61-70 mild symptoms  Plan Of Care/Follow-up recommendations:  Activity:  as tolerated Diet:  low-sodium heart healthy diet  Is patient on multiple antipsychotic therapies at discharge:  No   Has Patient had three or more failed trials of antipsychotic monotherapy by history:  No  Recommended Plan for Multiple Antipsychotic Therapies: NA    LORD, JAMISON, PMH-NP 01/20/2014, 1:49 PM

## 2014-01-20 NOTE — Consult Note (Signed)
[facility] Face-to-Face Psychiatry Consult   Reason for Consult:  Overdose Referring Physician:  EDP  Erika Mccoy is an 48 y.o. female. Total Time spent with patient: 20 minutes  Assessment: AXIS I:  Anxiety Disorder NOS and Bipolar, Depressed AXIS II:  Deferred AXIS III:   Past Medical History  Diagnosis Date  . Interstitial cystitis   . Fibromyalgia   . Anxiety   . Lichen sclerosus of female genitalia   . Manic depression   . HSV-2 infection     rare occurence  . MS (multiple sclerosis)   . IBS (irritable bowel syndrome)   . History of self mutilation   . Arm sprain 5/11    right   . Lichen sclerosus     Vulva   AXIS IV:  other psychosocial or environmental problems, problems related to social environment and problems with primary support group AXIS V:  61-70 mild symptoms  Plan:  No evidence of imminent risk to self or others at present.  Dr. Darleene Cleaver assessed the patient and concurs with the plan.  Subjective:   Erika Mccoy is a 48 y.o. female patient does not warrant admission.  HPI:  The patient go upset with her mother who was screaming at her on the phone and took an overdose.  She quickly realized it was a mistake and told her mother, came to the ED.  Regrets her actions, states she was overwhelmed with her mother and was impulsive.  She broke up with her boyfriend but he has not left yet.  Aseel felt he was using her for money and asked him to leave.  She denies suicidal/homicidal ideations, hallucinations, and alcohol/drug abuse.  Jeliyah has 3 adult children who are doing well and are supportive.  She wants to discharge and follow-up with her therapist since 2004 who she sees on a regular basis and the psychiatrist at [facility] where her therapist is.  Terrance requests to start Lamictal and Effexor since it has helped her in the past, she stopped her medications 1 1/2 weeks ago.  Rx given. HPI Elements:   Location:  generalized. Quality:   acute. Severity:  mild. Timing:  brief. Duration:  brief. Context:  stressors.  Past Psychiatric History: Past Medical History  Diagnosis Date  . Interstitial cystitis   . Fibromyalgia   . Anxiety   . Lichen sclerosus of female genitalia   . Manic depression   . HSV-2 infection     rare occurence  . MS (multiple sclerosis)   . IBS (irritable bowel syndrome)   . History of self mutilation   . Arm sprain 5/11    right   . Lichen sclerosus     Vulva    reports that she has never smoked. She has never used smokeless tobacco. She reports that she does not drink alcohol or use illicit drugs. History reviewed. No pertinent family history. Family History Substance Abuse: No Family Supports: No Living Arrangements: Alone Can pt return to current living arrangement?: Yes   Allergies:   Allergies  Allergen Reactions  . Cefzil [Cefprozil] Hives  . Ciprofloxacin     Small vessel vasculitis Unknown (Vasculitis)  . Lidocaine     Ointment caused burning.  . Monistat [Miconazole]   . Neosporin [Neomycin-Bacitracin Zn-Polymyx] Swelling    Hot  . Quinolones     Vasculitis with Levaquin and Cipro  . Septra [Sulfamethoxazole-Trimethoprim] Hives  . Valtrex [Valacyclovir Hcl]     Vomiting, diarrhea, and abdominal cramping.   Marland Kitchen  Adhesive [Tape] Rash and Other (See Comments)    Pulls skin off    ACT Assessment Complete:  Yes:    Educational Status    Risk to Self: Risk to self with the past 6 months Suicidal Ideation: Yes-Currently Present Suicidal Intent: Yes-Currently Present Is patient at risk for suicide?: Yes Suicidal Plan?: Yes-Currently Present Specify Current Suicidal Plan: overdose Access to Means: Yes Specify Access to Suicidal Means: personal medications What has been your use of drugs/alcohol within the last 12 months?: none Previous Attempts/Gestures: Yes How many times?: 3 Other Self Harm Risks: cutting Triggers for Past Attempts: Family contact Intentional  Self Injurious Behavior: Cutting Comment - Self Injurious Behavior: cutting Family Suicide History: No Recent stressful life event(s): Conflict (Comment);Loss (Comment) Persecutory voices/beliefs?: No Depression: Yes Depression Symptoms: Tearfulness;Isolating;Loss of interest in usual pleasures;Feeling angry/irritable;Feeling worthless/self pity Substance abuse history and/or treatment for substance abuse?: No  Risk to Others: Risk to Others within the past 6 months Homicidal Ideation: No-Not Currently/Within Last 6 Months Thoughts of Harm to Others: No-Not Currently Present/Within Last 6 Months Current Homicidal Intent: No-Not Currently/Within Last 6 Months Current Homicidal Plan: No-Not Currently/Within Last 6 Months Assessment of Violence: None Noted Criminal Charges Pending?: No  Abuse:    Prior Inpatient Therapy: Prior Inpatient Therapy Prior Inpatient Therapy: Yes Prior Therapy Dates: 2012, 2004 Prior Therapy Facilty/Provider(s): [place] Reason for Treatment: SI  Prior Outpatient Therapy: Prior Outpatient Therapy Prior Outpatient Therapy: Yes Prior Therapy Dates: 2015 Prior Therapy Facilty/Provider(s): Dr. Mitzi Hansen [place] Reason for Treatment: SI  Additional Information: Additional Information 1:1 In Past 12 Months?: No CIRT Risk: No Elopement Risk: No Does patient have medical clearance?: Yes                  Objective: Blood pressure 117/75, pulse 77, temperature 98.1 F (36.7 C), temperature source Oral, resp. rate 18, SpO2 100.00%.There is no weight on file to calculate BMI. Results for orders placed during the hospital encounter of 01/19/14 (from the past 72 hour(s))  CBC WITH DIFFERENTIAL     Status: None   Collection Time    01/19/14  2:08 PM      Result Value Ref Range   WBC 8.8  4.0 - 10.5 K/uL   RBC 4.51  3.87 - 5.11 MIL/uL   Hemoglobin 13.6  12.0 - 15.0 g/dL   HCT 39.4  36.0 - 46.0 %   MCV 87.4  78.0 - 100.0 fL   MCH 30.2  26.0 - 34.0  pg   MCHC 34.5  30.0 - 36.0 g/dL   RDW 12.1  11.5 - 15.5 %   Platelets 239  150 - 400 K/uL   Neutrophils Relative % 60  43 - 77 %   Neutro Abs 5.2  1.7 - 7.7 K/uL   Lymphocytes Relative 33  12 - 46 %   Lymphs Abs 3.0  0.7 - 4.0 K/uL   Monocytes Relative 6  3 - 12 %   Monocytes Absolute 0.6  0.1 - 1.0 K/uL   Eosinophils Relative 1  0 - 5 %   Eosinophils Absolute 0.1  0.0 - 0.7 K/uL   Basophils Relative 0  0 - 1 %   Basophils Absolute 0.0  0.0 - 0.1 K/uL  COMPREHENSIVE METABOLIC PANEL     Status: Abnormal   Collection Time    01/19/14  2:08 PM      Result Value Ref Range   Sodium 138  137 - 147 mEq/L   Potassium  3.9  3.7 - 5.3 mEq/L   Chloride 103  96 - 112 mEq/L   CO2 22  19 - 32 mEq/L   Glucose, Bld 91  70 - 99 mg/dL   BUN 9  6 - 23 mg/dL   Creatinine, Ser 0.76  0.50 - 1.10 mg/dL   Calcium 9.3  8.4 - 10.5 mg/dL   Total Protein 7.7  6.0 - 8.3 g/dL   Albumin 3.6  3.5 - 5.2 g/dL   AST 16  0 - 37 U/L   ALT 13  0 - 35 U/L   Alkaline Phosphatase 90  39 - 117 U/L   Total Bilirubin <0.2 (*) 0.3 - 1.2 mg/dL   GFR calc non Af Amer >90  >90 mL/min   GFR calc Af Amer >90  >90 mL/min   Comment: (NOTE)     The eGFR has been calculated using the CKD EPI equation.     This calculation has not been validated in all clinical situations.     eGFR's persistently <90 mL/min signify possible Chronic Kidney     Disease.   Anion gap 13  5 - 15  ACETAMINOPHEN LEVEL     Status: None   Collection Time    01/19/14  2:08 PM      Result Value Ref Range   Acetaminophen (Tylenol), Serum <15.0  10 - 30 ug/mL   Comment:            THERAPEUTIC CONCENTRATIONS VARY     SIGNIFICANTLY. A RANGE OF 10-30     ug/mL MAY BE AN EFFECTIVE     CONCENTRATION FOR MANY PATIENTS.     HOWEVER, SOME ARE BEST TREATED     AT CONCENTRATIONS OUTSIDE THIS     RANGE.     ACETAMINOPHEN CONCENTRATIONS     >150 ug/mL AT 4 HOURS AFTER     INGESTION AND >50 ug/mL AT 12     HOURS AFTER INGESTION ARE     OFTEN ASSOCIATED  WITH TOXIC     REACTIONS.  SALICYLATE LEVEL     Status: Abnormal   Collection Time    01/19/14  2:08 PM      Result Value Ref Range   Salicylate Lvl <7.0 (*) 2.8 - 20.0 mg/dL  ETHANOL     Status: None   Collection Time    01/19/14  2:08 PM      Result Value Ref Range   Alcohol, Ethyl (B) <11  0 - 11 mg/dL   Comment:            LOWEST DETECTABLE LIMIT FOR     SERUM ALCOHOL IS 11 mg/dL     FOR MEDICAL PURPOSES ONLY  URINALYSIS, ROUTINE W REFLEX MICROSCOPIC     Status: Abnormal   Collection Time    01/19/14  4:00 PM      Result Value Ref Range   Color, Urine YELLOW  YELLOW   APPearance CLOUDY (*) CLEAR   Specific Gravity, Urine 1.027  1.005 - 1.030   pH 5.0  5.0 - 8.0   Glucose, UA NEGATIVE  NEGATIVE mg/dL   Hgb urine dipstick NEGATIVE  NEGATIVE   Bilirubin Urine SMALL (*) NEGATIVE   Ketones, ur NEGATIVE  NEGATIVE mg/dL   Protein, ur NEGATIVE  NEGATIVE mg/dL   Urobilinogen, UA 0.2  0.0 - 1.0 mg/dL   Nitrite NEGATIVE  NEGATIVE   Leukocytes, UA TRACE (*) NEGATIVE  URINE RAPID DRUG SCREEN (HOSP PERFORMED)  Status: Abnormal   Collection Time    01/19/14  4:00 PM      Result Value Ref Range   Opiates NONE DETECTED  NONE DETECTED   Cocaine NONE DETECTED  NONE DETECTED   Benzodiazepines POSITIVE (*) NONE DETECTED   Amphetamines POSITIVE (*) NONE DETECTED   Tetrahydrocannabinol NONE DETECTED  NONE DETECTED   Barbiturates NONE DETECTED  NONE DETECTED   Comment:            DRUG SCREEN FOR MEDICAL PURPOSES     ONLY.  IF CONFIRMATION IS NEEDED     FOR ANY PURPOSE, NOTIFY LAB     WITHIN 5 DAYS.                LOWEST DETECTABLE LIMITS     FOR URINE DRUG SCREEN     Drug Class       Cutoff (ng/mL)     Amphetamine      1000     Barbiturate      200     Benzodiazepine   161     Tricyclics       096     Opiates          300     Cocaine          300     THC              50  URINE MICROSCOPIC-ADD ON     Status: Abnormal   Collection Time    01/19/14  4:00 PM      Result Value  Ref Range   Squamous Epithelial / LPF FEW (*) RARE   WBC, UA 0-2  <3 WBC/hpf   RBC / HPF 0-2  <3 RBC/hpf   Bacteria, UA MANY (*) RARE   Urine-Other MUCOUS PRESENT    POC URINE PREG, ED     Status: None   Collection Time    01/19/14  4:07 PM      Result Value Ref Range   Preg Test, Ur NEGATIVE  NEGATIVE   Comment:            THE SENSITIVITY OF THIS     METHODOLOGY IS >24 mIU/mL  ACETAMINOPHEN LEVEL     Status: None   Collection Time    01/19/14  6:38 PM      Result Value Ref Range   Acetaminophen (Tylenol), Serum <15.0  10 - 30 ug/mL   Comment:            THERAPEUTIC CONCENTRATIONS VARY     SIGNIFICANTLY. A RANGE OF 10-30     ug/mL MAY BE AN EFFECTIVE     CONCENTRATION FOR MANY PATIENTS.     HOWEVER, SOME ARE BEST TREATED     AT CONCENTRATIONS OUTSIDE THIS     RANGE.     ACETAMINOPHEN CONCENTRATIONS     >150 ug/mL AT 4 HOURS AFTER     INGESTION AND >50 ug/mL AT 12     HOURS AFTER INGESTION ARE     OFTEN ASSOCIATED WITH TOXIC     REACTIONS.   Labs are reviewed and are pertinent for no medical issues noted.  Current Facility-Administered Medications  Medication Dose Route Frequency Provider Last Rate Last Dose  . acetaminophen (TYLENOL) tablet 650 mg  650 mg Oral Q4H PRN Janice Norrie, MD      . ALPRAZolam Duanne Moron) tablet 0.5 mg  0.5 mg Oral BID PRN Janice Norrie, MD      .  alum & mag hydroxide-simeth (MAALOX/MYLANTA) 200-200-20 MG/5ML suspension 30 mL  30 mL Oral PRN Janice Norrie, MD      . bismuth subsalicylate (PEPTO BISMOL) 262 MG/15ML suspension 30 mL  30 mL Oral Q6H PRN Pamella Pert, MD      . clonazePAM Bobbye Charleston) tablet 2 mg  2 mg Oral Daily Pamella Pert, MD   2 mg at 01/20/14 0923  . dicyclomine (BENTYL) capsule 10 mg  10 mg Oral BID PRN Pamella Pert, MD   10 mg at 01/19/14 2341  . fluticasone (FLONASE) 50 MCG/ACT nasal spray 1 spray  1 spray Each Nare Daily PRN Threasa Beards, MD      . hydrOXYzine (ATARAX/VISTARIL) tablet 25 mg  25 mg Oral QHS PRN Threasa Beards, MD      . hydrOXYzine (ATARAX/VISTARIL) tablet 50 mg  50 mg Oral QHS PRN Janice Norrie, MD      . ibuprofen (ADVIL,MOTRIN) tablet 600 mg  600 mg Oral Q8H PRN Janice Norrie, MD      . lamoTRIgine (LAMICTAL) tablet 25 mg  25 mg Oral Daily Waylan Boga, NP   25 mg at 01/20/14 1315  . montelukast (SINGULAIR) tablet 10 mg  10 mg Oral Daily Threasa Beards, MD   10 mg at 01/20/14 0923  . nicotine (NICODERM CQ - dosed in mg/24 hours) patch 21 mg  21 mg Transdermal Daily Janice Norrie, MD      . ondansetron (ZOFRAN) tablet 4 mg  4 mg Oral Q8H PRN Janice Norrie, MD      . pantoprazole (PROTONIX) EC tablet 40 mg  40 mg Oral Daily Janice Norrie, MD   40 mg at 01/20/14 0923  . pentosan polysulfate (ELMIRON) capsule 100 mg  100 mg Oral BID Pamella Pert, MD   100 mg at 01/20/14 0948  . polyethylene glycol (MIRALAX / GLYCOLAX) packet 17 g  17 g Oral Daily Threasa Beards, MD   17 g at 01/20/14 0801  . prazosin (MINIPRESS) capsule 6 mg  6 mg Oral QHS Pamella Pert, MD      . topiramate (TOPAMAX) tablet 25 mg  25 mg Oral BID Pamella Pert, MD   25 mg at 01/20/14 0942  . traZODone (DESYREL) tablet 200 mg  200 mg Oral QHS Pamella Pert, MD   200 mg at 01/19/14 2343  . [START ON 01/21/2014] venlafaxine XR (EFFEXOR-XR) 24 hr capsule 75 mg  75 mg Oral Q breakfast Waylan Boga, NP      . white petrolatum (VASELINE) gel   Topical PRN Threasa Beards, MD      . zolpidem (AMBIEN) tablet 5 mg  5 mg Oral QHS PRN Janice Norrie, MD       Current Outpatient Prescriptions  Medication Sig Dispense Refill  . acetaminophen-codeine (TYLENOL #4) 300-60 MG per tablet Take 1 tablet by mouth every 8 (eight) hours as needed for moderate pain.      Marland Kitchen ALPRAZolam (XANAX) 0.5 MG tablet Take 0.5 mg by mouth 2 (two) times daily as needed for anxiety.      . bismuth subsalicylate (PEPTO BISMOL) 262 MG/15ML suspension Take 30 mLs by mouth every 6 (six) hours as needed for indigestion or diarrhea or loose stools.       . clonazePAM  (KLONOPIN) 2 MG tablet Take 2 mg by mouth daily.       . diazepam (DIASAT) 20 MG GEL Place 10 mg vaginally 2 (two) times  daily.      . dicyclomine (BENTYL) 10 MG capsule Take 10 mg by mouth 2 (two) times daily.       . fluticasone (FLONASE) 50 MCG/ACT nasal spray Place 1 spray into the nose daily as needed for allergies.       Marland Kitchen HYDROXYZINE HCL PO Take 2 tablets by mouth at bedtime as needed (sleep).      . montelukast (SINGULAIR) 10 MG tablet Take 10 mg by mouth every morning.       Marland Kitchen omeprazole (PRILOSEC OTC) 20 MG tablet Take 20 mg by mouth daily.       . pentosan polysulfate (ELMIRON) 100 MG capsule Take 100 mg by mouth every morning.       . Polyethylene Glycol 3350 (MIRALAX PO) Take 1 scoop by mouth daily as needed (constipation).       . prazosin (MINIPRESS) 2 MG capsule Take 6 mg by mouth at bedtime.      . topiramate (TOPAMAX) 25 MG tablet Take 25 mg by mouth 2 (two) times daily.       . traMADol (ULTRAM) 50 MG tablet Take 50 mg by mouth every 6 (six) hours as needed for moderate pain.       . traZODone (DESYREL) 100 MG tablet Take 200 mg by mouth at bedtime.         Psychiatric Specialty Exam:     Blood pressure 117/75, pulse 77, temperature 98.1 F (36.7 C), temperature source Oral, resp. rate 18, SpO2 100.00%.There is no weight on file to calculate BMI.  General Appearance: Casual  Eye Contact::  Good  Speech:  Normal Rate  Volume:  Normal  Mood:  Anxious  Affect:  Congruent  Thought Process:  Coherent  Orientation:  Full (Time, Place, and Person)  Thought Content:  WDL  Suicidal Thoughts:  No  Homicidal Thoughts:  No  Memory:  Immediate;   Good Recent;   Good Remote;   Good  Judgement:  Fair  Insight:  Fair  Psychomotor Activity:  Normal  Concentration:  Good  Recall:  Good  Fund of Knowledge:Good  Language: Good  Akathisia:  No  Handed:  Right  AIMS (if indicated):     Assets:  Communication Skills Desire for Improvement Financial  Resources/Insurance Housing Leisure Time Resilience Social Support Transportation  Sleep:      Musculoskeletal: Strength & Muscle Tone: within normal limits Gait & Station: normal Patient leans: N/A  Treatment Plan Summary: Discharge home with follow-up with her regular providers, Rx for Lamictal and Effexor given.  Waylan Boga, [place] 01/20/2014 1:30 PM  Patient seen, evaluated and I agree with notes by Nurse Practitioner. Corena Pilgrim, MD

## 2014-01-20 NOTE — ED Notes (Addendum)
Pt experienced two tearful episodes tonight.  When approached by staff, pt was clearly startled and it took a moment for her to realize that she was still in the hospital.  Pt reports this being a common occurrence. She states she has nightmares, but does not remember the negative events in the dream.

## 2014-01-20 NOTE — Discharge Instructions (Signed)

## 2014-01-20 NOTE — ED Notes (Addendum)
Although pt has awoken twice crying due to nightmares, she is currently sleeping. Rise and fall of chest observed, pt is in NAD and breathing is WDL. Will defer vitals until 0630 per Everardo Pacific, RN

## 2014-01-28 ENCOUNTER — Ambulatory Visit (INDEPENDENT_AMBULATORY_CARE_PROVIDER_SITE_OTHER): Payer: BC Managed Care – PPO | Admitting: Gynecology

## 2014-01-28 ENCOUNTER — Encounter: Payer: Self-pay | Admitting: Gynecology

## 2014-01-28 VITALS — BP 102/68 | HR 66 | Resp 12 | Ht 64.0 in | Wt 166.0 lb

## 2014-01-28 DIAGNOSIS — Z01419 Encounter for gynecological examination (general) (routine) without abnormal findings: Secondary | ICD-10-CM

## 2014-01-28 DIAGNOSIS — L940 Localized scleroderma [morphea]: Secondary | ICD-10-CM

## 2014-01-28 DIAGNOSIS — L900 Lichen sclerosus et atrophicus: Secondary | ICD-10-CM

## 2014-01-28 DIAGNOSIS — Z0000 Encounter for general adult medical examination without abnormal findings: Secondary | ICD-10-CM

## 2014-01-28 DIAGNOSIS — R6882 Decreased libido: Secondary | ICD-10-CM

## 2014-01-28 DIAGNOSIS — R350 Frequency of micturition: Secondary | ICD-10-CM

## 2014-01-28 LAB — POCT URINALYSIS DIPSTICK
Leukocytes, UA: NEGATIVE
RBC UA: 1
Urobilinogen, UA: NEGATIVE
pH, UA: 5

## 2014-01-28 NOTE — Progress Notes (Signed)
48 y.o. Single Caucasian female   G2P2 here for annual exam. Pt is currently sexually active. Pt states that she cannot seem to orgasm, but no dyspareunia.  Pt thought it might be related to her effexor but has stopped rx over 7m ago without any benefit.  Pt states problem has been going on for several years but she has seen some improvement since she started lamictal.   Pt has been seeing new urologist for her IC and is doing better, less self catheterizing.    Pt has been using her temovate once or twice a week, if uses more frequently she may rip.    Patient's last menstrual period was 01/25/2014.          Sexually active: Yes.    The current method of family planning is ablation.    Exercising: No.  The patient does not participate in regular exercise at present. Last pap: 10/26/11 NEG HR HPV Alcohol: 1 Drink/wk Tobacco: no BSE: yes  Mammogram: 11/26/13  Hgb: 13.1 ; Urine: Blood 1  Health Maintenance  Topic Date Due  . Influenza Vaccine  12/14/2013  . Pap Smear  10/26/2014  . Tetanus/tdap  10/11/2020    History reviewed. No pertinent family history.  Patient Active Problem List   Diagnosis Date Noted  . Rotator cuff syndrome 07/15/2013  . ANA positive 03/12/2013  . PCB (post coital bleeding) 01/18/2013  . Lichen sclerosus 12/03/2012  . Irritable bowel syndrome 12/03/2012  . Interstitial cystitis 12/03/2012  . Hypertriglyceridemia 10/25/2011  . Allergic rhinitis, seasonal 09/28/2011  . Adhesions of vagina 08/22/2011  . 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 Acne 12/21/2010  . Bipolar 1 disorder 12/09/2010  . 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 Fibrositis 12/09/2010  . Anxiety, generalized 12/09/2010  . 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 Interstitial cystitis   . Fibromyalgia   . Anxiety   . Lichen sclerosus of female genitalia   . Manic depression   . HSV-2 infection     rare occurence  . MS (multiple sclerosis)   . IBS  (irritable bowel syndrome)   . History of self mutilation   . Arm sprain 5/11    right   . Lichen sclerosus     Vulva    Past Surgical History  Procedure Laterality Date  . Endometrial ablation  1997/1998  . Breast enhancement surgery  1990    Allergies: Cefzil; Ciprofloxacin; Lidocaine; Monistat; Neosporin; Quinolones; Septra; Valtrex; and Adhesive  Current Outpatient Prescriptions  Medication Sig Dispense Refill  . clonazePAM (KLONOPIN) 2 MG tablet Take 1 tablet (2 mg total) by mouth daily.  30 tablet    . HYDROXYZINE HCL PO Take 2 tablets by mouth at bedtime as needed (sleep).      Marland Kitchen acetaminophen-codeine (TYLENOL #4) 300-60 MG per tablet Take 1 tablet by mouth every 8 (eight) hours as needed for moderate pain.      Marland Kitchen bismuth subsalicylate (PEPTO BISMOL) 262 MG/15ML suspension Take 30 mLs by mouth every 6 (six) hours as needed for indigestion or diarrhea or loose stools.       . dicyclomine (BENTYL) 10 MG capsule Take 1 capsule (10 mg total) by mouth 2 (two) times daily.      . fluticasone (FLONASE) 50 MCG/ACT nasal spray Place 1 spray into the nose daily as needed for allergies.       Marland Kitchen lamoTRIgine (LAMICTAL) 25 MG tablet Take 1 tablet (25 mg total) by mouth daily.  60 tablet  0  . montelukast (SINGULAIR) 10 MG tablet Take 1 tablet (10 mg total) by mouth every morning.      Marland Kitchen omeprazole (PRILOSEC OTC) 20 MG tablet Take 1 tablet (20 mg total) by mouth daily.      . pentosan polysulfate (ELMIRON) 100 MG capsule Take 1 capsule (100 mg total) by mouth every morning.      . Polyethylene Glycol 3350 (MIRALAX PO) Take 1 scoop by mouth daily as needed (constipation).       . prazosin (MINIPRESS) 2 MG capsule Take 3 capsules (6 mg total) by mouth at bedtime.      . topiramate (TOPAMAX) 25 MG tablet Take 1 tablet (25 mg total) by mouth 2 (two) times daily.      . traZODone (DESYREL) 100 MG tablet Take 2 tablets (200 mg total) by mouth at bedtime.      Marland Kitchen venlafaxine XR (EFFEXOR-XR) 75 MG 24  hr capsule Take 1 capsule (75 mg total) by mouth daily with breakfast.  30 capsule  0   No current facility-administered medications for this visit.    ROS: Pertinent items are noted in HPI.  Exam:    BP 102/68  Pulse 66  Resp 12  Ht  (1.626 m)  Wt 166 lb (75.297 kg)  BMI 28.48 kg/m2  LMP 01/25/2014 Weight change: @ Last 3 height recordings:  Ht Readings from Last 3 Encounters:  01/28/14  (1.626 m)  12/16/13 5' 4.5" (1.638 m)  11/25/13 5' 4.5" (1.638 m)   General appearance: alert, cooperative and appears stated age Head: Normocephalic, without obvious abnormality, atraumatic Neck: no adenopathy, no carotid bruit, no JVD, supple, symmetrical, trachea midline and thyroid not enlarged, symmetric, no tenderness/mass/nodules Lungs: clear to auscultation bilaterally Breasts: normal appearance, no masses or tenderness, implants in place Heart: regular rate and rhythm, S1, S2 normal, no murmur, click, rub or gallop Abdomen: soft, non-tender; bowel sounds normal; no masses,  no organomegaly Extremities: extremities normal, atraumatic, no cyanosis or edema Skin: Skin color, texture, turgor normal. No rashes or lesions Lymph nodes: Cervical, supraclavicular, and axillary nodes normal. no inguinal nodes palpated Neurologic: Grossly normal   Pelvic: External genitalia:  Clitoral hood scarring and fusion in upper crux, minora normal              Urethra: not indicated and normal appearing urethra with no masses, tenderness or lesions              Bartholins and Skenes: Bartholin's, Urethra, Skene's normal                 Vagina: normal appearing vagina with normal color and discharge, no lesions              Cervix: normal appearance              Pap taken: No.        Bimanual Exam:  Uterus:  uterus is normal size, shape, consistency and nontender                                      Adnexa:    no masses                                      Rectovaginal:  Confirms  Anus:  normal sphincter tone, no lesions      1. Routine gynecological examination   counseled on breast self exam, mammography screening, adequate intake of calcium and vitamin D, diet and exercise New partner, stressed importance of contraception-condoms return annually or prn 2. Laboratory examination ordered as part of a routine general medical examination  - POCT Urinalysis Dipstick - Hemoglobin, fingerstick  3. Urine frequency History of IC-seeing new urologist and has been doing well,   urine culture  4. Lichen sclerosus External genitalia free of disease except in area of clitoris, reviewed hood scarring and affect on orgasm.  Suggest she apply temovate to area nightly, shown area of concern in mirror, has enough temovate F/u 58m  5. Decreased libido without sexual dysfunction Discussed affect of polypharmacy on libido, although she has had some improvement after switching from effexor to lamictal, she may still have some residua, will speak with her psychiatrist.  Pt states her mania has been well controlled, but depressive side less so.  Denies being hypersexual in mania phase.   An After Visit Summary was printed and given to the patient.

## 2014-01-29 LAB — HEMOGLOBIN, FINGERSTICK: Hemoglobin, fingerstick: 13.1 g/dL (ref 12.0–16.0)

## 2014-01-30 LAB — URINE CULTURE
Colony Count: NO GROWTH
Organism ID, Bacteria: NO GROWTH

## 2014-02-25 ENCOUNTER — Ambulatory Visit (INDEPENDENT_AMBULATORY_CARE_PROVIDER_SITE_OTHER): Payer: BC Managed Care – PPO | Admitting: Gynecology

## 2014-02-25 VITALS — BP 100/60 | HR 70 | Resp 12 | Wt 162.0 lb

## 2014-02-25 DIAGNOSIS — R6882 Decreased libido: Secondary | ICD-10-CM

## 2014-02-25 DIAGNOSIS — L900 Lichen sclerosus et atrophicus: Secondary | ICD-10-CM

## 2014-02-25 MED ORDER — NONFORMULARY OR COMPOUNDED ITEM: Status: DC

## 2014-02-25 NOTE — Progress Notes (Signed)
Subjective:     Patient ID: Erika Mccoy, female   DOB: 12-24-1965, 48 y.o.   MRN: [id number]  HPI Comments: Pt here for follow up of her LS, she is using the temovate nightly as recommended, and concentrating over the clitoral hood.  Pt is still distressed about her change in orgasm, requests testosterone.  Pt is going to see her current boyfriend tomorrow    Review of Systems Per HPI    Objective:   Physical Exam  Nursing note and vitals reviewed. Constitutional: She appears well-developed and well-nourished.  Genitourinary:  External genitalia without lacerations, lesions, supple. No parchement like changes Clitoral hood retractable, clitoris normal appearing  Skin: Skin is warm and dry.       Assessment:     LS doing well anorgasmia     Plan:     Can decrease temovate frequency Continue cetaphil liquid Trial of testosterone cream to clitoris twice a week, anorgasmia may be related to polypharmacy.  Side effects of exogenous testosterone reviewed including clitoromegaly, accepted

## 2014-02-28 ENCOUNTER — Ambulatory Visit: Payer: BC Managed Care – PPO | Admitting: Gynecology

## 2014-03-17 ENCOUNTER — Encounter: Payer: Self-pay | Admitting: Gynecology

## 2014-05-22 ENCOUNTER — Telehealth: Payer: Self-pay | Admitting: Gynecology

## 2014-05-22 NOTE — Telephone Encounter (Signed)
Patient calling requesting an appointment to see Dr. Farrel Gobble for lichen sclerosis. Patient started crying when I informed her Dr. Farrel Gobble is no longer with [place]. Please advise and schedule as necessary?

## 2014-05-22 NOTE — Telephone Encounter (Signed)
Spoke with patient. Patient states that over the holidays her immune system decreased due to stress. "My fibromyalgia kicked in and I was unable to get off the couch. Then I had problems with my IC. 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 Now my lichen sclerosis has flared up." States vaginal tissue started off white and itchy. Has been using clobetasol with some relief. "Now the skin is just really red and angry. It is really irritated." Advised will need to be seen for evaluation with MD in office. Patient is agreeable. Appointment scheduled for tomorrow at 9:45am with Dr.Silva. Patient is agreeable to date and time.  Routing to provider for final review. Patient agreeable to disposition. Will close encounter

## 2014-05-22 NOTE — Telephone Encounter (Signed)
Spoke with patient. Appointment moved up to tomorrow at 9:30am with Dr.Silva (time per Kennon Rounds). Patient is agreeable to date and time.  Encounter previously closed.

## 2014-05-23 ENCOUNTER — Ambulatory Visit (INDEPENDENT_AMBULATORY_CARE_PROVIDER_SITE_OTHER): Payer: BLUE CROSS/BLUE SHIELD | Admitting: Obstetrics and Gynecology

## 2014-05-23 ENCOUNTER — Encounter: Payer: Self-pay | Admitting: Obstetrics and Gynecology

## 2014-05-23 VITALS — BP 118/70 | HR 78 | Resp 18 | Ht 64.0 in | Wt 152.6 lb

## 2014-05-23 DIAGNOSIS — E559 Vitamin D deficiency, unspecified: Secondary | ICD-10-CM

## 2014-05-23 DIAGNOSIS — B373 Candidiasis of vulva and vagina: Secondary | ICD-10-CM

## 2014-05-23 DIAGNOSIS — L900 Lichen sclerosus et atrophicus: Secondary | ICD-10-CM

## 2014-05-23 DIAGNOSIS — IMO0002 Reserved for concepts with insufficient information to code with codable children: Secondary | ICD-10-CM

## 2014-05-23 DIAGNOSIS — Z0000 Encounter for general adult medical examination without abnormal findings: Secondary | ICD-10-CM

## 2014-05-23 DIAGNOSIS — B3731 Acute candidiasis of vulva and vagina: Secondary | ICD-10-CM

## 2014-05-23 LAB — HEMOGLOBIN, FINGERSTICK: Hemoglobin, fingerstick: 13.4 g/dL (ref 12.0–16.0)

## 2014-05-23 MED ORDER — FLUCONAZOLE 150 MG PO TABS
150.0000 mg | ORAL_TABLET | Freq: Once | ORAL | Status: DC
Start: 2014-05-23 — End: 2014-06-27

## 2014-05-23 NOTE — Progress Notes (Signed)
GYNECOLOGY VISIT  PCP:  Asencion Partridge, MD   Referring provider:   HPI: 49 y.o.   Legally Separated/Engaged  Caucasian  female   G2P2 with No LMP recorded.   here for  Problem visit.   States she feels run down.  Feels she had a reaction to her Christmas tree. Exacerbation of her asthma. Using an inhaler.   Feels she is having a flare of fibromyalgia and lichen sclerosus.   Having itching, stinging, and white- red change of her vulva.  Having abrasions and tearing of her skin near her anus.  Using Clobetasol.   Having post coital bleeding.  Not sure where the bleeding is coming from.  No dysparuenia.  Uses KY jelly. Has Estrace at home but not is using.   Wants check for yeast today.  Wants vit D check.   Engaged and may move [place].  Hgb:  13.4  Urine:  declined  GYNECOLOGIC HISTORY: No LMP recorded. Sexually active: yes   Partner preference: female  Contraception:   no Menopausal hormone therapy:  no DES exposure:   no Blood transfusions:   no Sexually transmitted diseases:   no GYN procedures and prior surgeries:   Endometrial Ablation 1997/1998 Last mammogram:    11/26/13  Bi-rads 1: Negative ; Left Breast Ultrasound  Negative ; f/u mmg due in December 2015  Last pap and high risk HPV testing:   10/26/11 NEG HR HPV not detected History of abnormal pap smear:  Yes, had Leep Age 74's, been normal eversince   OB History    Gravida Para Term Preterm AB TAB SAB Ectopic Multiple Living   2 2       1 3          Patient Active Problem List   Diagnosis Date Noted  . Rotator cuff syndrome 07/15/2013  . ANA positive 03/12/2013  . PCB (post coital bleeding) 01/18/2013  . Lichen sclerosus 12/03/2012  . Irritable bowel syndrome 12/03/2012  . Interstitial cystitis 12/03/2012  . Hypertriglyceridemia 10/25/2011  . Allergic rhinitis, seasonal 09/28/2011  . Adhesions of vagina 08/22/2011  . 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 Acne 12/21/2010  . Bipolar 1 disorder  12/09/2010  . 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 Fibrositis 12/09/2010  . Anxiety, generalized 12/09/2010  . 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 Interstitial cystitis   . Fibromyalgia   . Anxiety   . Lichen sclerosus of female genitalia   . Manic depression   . HSV-2 infection     rare occurence  . MS (multiple sclerosis)   . IBS (irritable bowel syndrome)   . History of self mutilation   . Arm sprain 5/11    right   . Lichen sclerosus     Vulva    Past Surgical History  Procedure Laterality Date  . Endometrial ablation  1997/1998  . Breast enhancement surgery  1990    Current Outpatient Prescriptions  Medication Sig Dispense Refill  . clobetasol ointment (TEMOVATE) 0.05 % Apply 1 application topically once a week.    . clonazePAM (KLONOPIN) 2 MG tablet Take 1 tablet (2 mg total) by mouth daily. 30 tablet   . dicyclomine (BENTYL) 10 MG capsule Take 1 capsule (10 mg total) by mouth 2 (two) times daily.    Marland Kitchen HYDROXYZINE HCL PO Take 2 tablets by mouth at bedtime as needed (sleep).    Marland Kitchen lamoTRIgine (LAMICTAL) 25 MG tablet Take 1 tablet (25 mg total) by mouth daily.  60 tablet 0  . montelukast (SINGULAIR) 10 MG tablet Take 1 tablet (10 mg total) by mouth every morning.    . NONFORMULARY OR COMPOUNDED ITEM Testosterone Propionate 2% cream Apply 1/4 tsp to clitoris twice weekly #60 gram 1 each 3  . omeprazole (PRILOSEC OTC) 20 MG tablet Take 1 tablet (20 mg total) by mouth daily.    . pentosan polysulfate (ELMIRON) 100 MG capsule Take 1 capsule (100 mg total) by mouth every morning.    . prazosin (MINIPRESS) 2 MG capsule Take 3 capsules (6 mg total) by mouth at bedtime.    . topiramate (TOPAMAX) 25 MG tablet Take 1 tablet (25 mg total) by mouth 2 (two) times daily.    . traMADol (ULTRAM) 50 MG tablet TAKE 1 TABLET BY MOUTH EVERY 6 HOURS AS NEEDED FOR PAIN    . traZODone (DESYREL) 100 MG tablet Take 2 tablets (200 mg total) by mouth at  bedtime.    . traZODone (DESYREL) 100 MG tablet 2 (two) times daily.     No current facility-administered medications for this visit.     ALLERGIES: Cefzil; Ciprofloxacin; Lidocaine; Monistat; Neosporin; Quinolones; Septra; Valtrex; and Adhesive  No family history on file.  History   Social History  . Marital Status: Legally Separated    Spouse Name: N/A    Number of Children: N/A  . Years of Education: N/A   Occupational History  . Not on file.   Social History Main Topics  . Smoking status: Never Smoker   . Smokeless tobacco: Never Used  . Alcohol Use: 0.5 oz/week    1 drink(s) per week  . Drug Use: No  . Sexual Activity:    Partners: Male    Birth Control/ Protection: Surgical     Comment: Ablation   Other Topics Concern  . Not on file   Social History Narrative    ROS:  Pertinent items are noted in HPI.  PHYSICAL EXAMINATION:    BP 118/70 mmHg  Pulse 78  Resp 18  Ht  (1.626 m)  Wt 152 lb 9.6 oz (69.219 kg)  BMI 26.18 kg/m2   Wt Readings from Last 3 Encounters:  05/23/14 152 lb 9.6 oz (69.219 kg)  02/25/14 162 lb (73.483 kg)  01/28/14 166 lb (75.297 kg)     Ht Readings from Last 3 Encounters:  05/23/14  (1.626 m)  01/28/14  (1.626 m)  12/16/13 5' 4.5" (1.638 m)    General appearance: alert, cooperative and appears stated age   Pelvic: External genitalia:  Split in perineal skin and supraclitoral hood.              Urethra:  normal appearing urethra with no masses, tenderness or lesions              Bartholins and Skenes: normal                 Vagina: normal appearing vagina with normal color and discharge, no lesions              Cervix: normal appearance                 Bimanual Exam:  Uterus:  uterus is normal size, shape, consistency and nontender                                      Adnexa: normal adnexa in size,  nontender and no masses    Wet Prep - pH 4.0, negative clue cells and trichomonas.  Positive few yeast.                                 ASSESSMENT  Lichen sclerosus. Yeast vulvovaginitis.  Vit D deficiency.  PLAN  Clobetasol 0.05% to area bid for 2 - 3 weeks.  Diflucan.  See orders.  Probiotics.  Epsom salt baths prn.  Use aloe wipes.  Check vit D level.   An After Visit Summary was printed and given to the patient.  25 minutes face to face time of which over 50% was spent in counseling.

## 2014-05-24 ENCOUNTER — Other Ambulatory Visit: Payer: Self-pay | Admitting: Obstetrics and Gynecology

## 2014-05-24 DIAGNOSIS — E559 Vitamin D deficiency, unspecified: Secondary | ICD-10-CM

## 2014-05-24 LAB — VITAMIN D 25 HYDROXY (VIT D DEFICIENCY, FRACTURES): Vit D, 25-Hydroxy: 13 ng/mL — ABNORMAL LOW (ref 30–100)

## 2014-05-24 MED ORDER — VITAMIN D (ERGOCALCIFEROL) 1.25 MG (50000 UNIT) PO CAPS: 50000.0000 [IU] | ORAL_CAPSULE | ORAL | Status: DC

## 2014-05-29 ENCOUNTER — Telehealth: Payer: Self-pay | Admitting: Obstetrics and Gynecology

## 2014-05-29 NOTE — Telephone Encounter (Signed)
Spoke with patient. Patient states that she was recently seen with Dr.Silva and treated for a yeast infection with Diflucan. Patient took first pill 1/8 and second on 1/11. "My symptoms never went away. I am still having itching and white discharge. I was having sex during that time so I think I gave it to my boyfriend and he gave it back to me. I have monistat at home but I am allergic to it. I was going to give it to my boyfriend." Advised patient boyfriend will need to seek evaluation and treatment at urgent care or PCP and should not use Monistat. Patient is agreeable. Patient requesting refill on diflucan as she can not use OTC treatment. Declines OV.Advised will need to speak with Dr.Silva regarding request and return call. Patient is agreeable.

## 2014-05-29 NOTE — Telephone Encounter (Signed)
Patient calling requesting an RX for Diflucan. She was seen recently for the same problem and stated, "I think I gave it to my boyfriend and he gave it back to me." She declined an appointment.

## 2014-05-30 MED ORDER — FLUCONAZOLE 150 MG PO TABS: 150.0000 mg | ORAL_TABLET | Freq: Once | ORAL | Status: DC

## 2014-05-30 NOTE — Telephone Encounter (Signed)
OK to give patient refill of Diflucan 150 mg take one and then repeat in 48 hours if needed.  #2, RF: none.   Please send to pharmacy of choice for patient.   No intercourse during treatment.  Partner will need to get treatment as well.   If symptoms persist, I will have patient come in for Affirm testing.

## 2014-05-30 NOTE — Telephone Encounter (Signed)
Spoke with patient. Advised of message as seen below from Dr.Silva. Patient is agreeable. Diflucan 150 mg #2 0RF sent to pharmacy on file. Will return call if symptoms persist.  Routing to provider for final review. Patient agreeable to disposition. Will close encounter

## 2014-06-02 ENCOUNTER — Telehealth: Payer: Self-pay | Admitting: Obstetrics and Gynecology

## 2014-06-02 NOTE — Telephone Encounter (Signed)
Patient calling requesting to be seen today due to "yeast infection still not resolved." She is willing to see any provider.

## 2014-06-02 NOTE — Telephone Encounter (Signed)
Left message to call Kaitlyn at [phone]. 

## 2014-06-02 NOTE — Telephone Encounter (Signed)
Routing to Verner Chol CNM as Erika Mccoy.  Cc: Dr.Silva  Routing to provider for final review. Patient agreeable to disposition. Will close encounter

## 2014-06-02 NOTE — Telephone Encounter (Signed)
Pt scheduled with Ms Eunice Blase 06/03/14 at 12:45.

## 2014-06-02 NOTE — Telephone Encounter (Signed)
Spoke with patient. Patient states that she is still having vaginal discharge with odor and burning. Was seen on 1/8 with Dr.Silva and given Diflucan. Patient called in on 1/14 with persisting symptoms. Patient was given refill on diflucan at that time. Patient has completed second dose and is still having symptoms. Advised will need to be seen for further evaluation. Patient is agreeable. States she will need to check on an appointment time tomorrow and return call to schedule appointment.

## 2014-06-02 NOTE — Telephone Encounter (Signed)
Returning a call to Kaitlyn. °

## 2014-06-03 ENCOUNTER — Ambulatory Visit (INDEPENDENT_AMBULATORY_CARE_PROVIDER_SITE_OTHER): Payer: BLUE CROSS/BLUE SHIELD | Admitting: Certified Nurse Midwife

## 2014-06-03 ENCOUNTER — Encounter: Payer: Self-pay | Admitting: Certified Nurse Midwife

## 2014-06-03 VITALS — BP 120/70 | HR 68 | Resp 16 | Ht 64.0 in | Wt 149.0 lb

## 2014-06-03 DIAGNOSIS — N912 Amenorrhea, unspecified: Secondary | ICD-10-CM

## 2014-06-03 DIAGNOSIS — L900 Lichen sclerosus et atrophicus: Secondary | ICD-10-CM

## 2014-06-03 DIAGNOSIS — N898 Other specified noninflammatory disorders of vagina: Secondary | ICD-10-CM

## 2014-06-03 LAB — POCT URINE PREGNANCY: Preg Test, Ur: NEGATIVE

## 2014-06-03 NOTE — Patient Instructions (Signed)
Lichen Sclerosus Lichen sclerosus is a skin problem. It can happen on any part of the body. It happens most often in the anal or genital areas. Girls and women get lichen sclerosus more often than boys and men. The cause is not known. This skin problem is not passed from one person to another. HOME CARE  Only take medicines as told by your doctor.  Keep the vagina as clean and dry as you can. GET HELP RIGHT AWAY IF: Your pain, puffiness (swelling), or redness gets worse. MAKE SURE YOU:  Understand these instructions.  Will watch your condition.  Will get help right away if you are not doing well or get worse. Document Released: 04/14/2008 Document Revised: 07/25/2011 Document Reviewed: 09/03/2010 ExitCare Patient Information 2015 [employer]. This information is not intended to replace advice given to you by your health care provider. Make sure you discuss any questions you have with your health care provider.  

## 2014-06-03 NOTE — Progress Notes (Signed)
49 y.o. single white g2p2002 here with complaint of vaginal symptoms of wetness, itching, burning, and increase white discharge. Describes discharge as white and yeasty smell. Onset of symptoms 2 weeks ago.Treated with Diflucan after visit on 1/8//16 with Dr. Edward Jolly. Continues with Clobetasol use for Lichen Scelerosus chronic. Denies new personal products. No STD concerns. Urinary symptoms none .Engaged to be married and honeymoon in [place]. Contraception none.  O:Healthy female WDWN Affect: normal, orientation x 3  Exam: Lymph node: no enlargement or tenderness Pelvic exam: External genital: normal female with no scaling or exudate noted. Small area of redness noted at fourchette only. Vulva area faint light lacelike pattern consistent with LS. Affirm collected from vulva area also BUS: negative Vagina: scant normal appearing, non odorous discharge noted. Affirm taken from area  Cervix: normal, non tender Uterus: normal, non tender Adnexa:normal, non tender, no masses or fullness noted   Wet Prep results:not done  Urine UPT-neg  A:History of chronic Lichen Sclerosus with Clobetasol use Vaginal discharge normal appearance, affirm collected  Encouraged contraception use even with ablation.    P:Discussed findings of LS, which she is aware and treating.Discussed Aveeno or baking soda sitz bath for comfort. Avoid moist clothes or pads for extended period of time.  Avoid over use of Clobetasol which will thin skin and increase watery feel. Patient aware. Suggested start on oral refrigerated probiotic to help with immune response. Patient will consider. Will advise of affirm result and treat if indicated.    Rv prn

## 2014-06-04 LAB — WET PREP BY MOLECULAR PROBE
Candida species: NEGATIVE
Gardnerella vaginalis: NEGATIVE
TRICHOMONAS VAG: NEGATIVE

## 2014-06-04 NOTE — Progress Notes (Signed)
Reviewed personally.  M. Suzanne Emmalyn Hinson, MD.  

## 2014-06-13 ENCOUNTER — Other Ambulatory Visit: Payer: Self-pay

## 2014-06-13 DIAGNOSIS — Z1231 Encounter for screening mammogram for malignant neoplasm of breast: Secondary | ICD-10-CM

## 2014-06-26 ENCOUNTER — Ambulatory Visit
Admission: RE | Admit: 2014-06-26 | Discharge: 2014-06-26 | Disposition: A | Discharge status: Home / Self Care | Payer: BLUE CROSS/BLUE SHIELD | Source: Ambulatory Visit

## 2014-06-26 ENCOUNTER — Telehealth: Payer: Self-pay | Admitting: Certified Nurse Midwife

## 2014-06-26 DIAGNOSIS — Z1231 Encounter for screening mammogram for malignant neoplasm of breast: Secondary | ICD-10-CM

## 2014-06-26 NOTE — Telephone Encounter (Signed)
Spoke with patient. Patient states that she went to have mammogram done at the [facility] but they were unable to complete it due to a lump patient has found. "I feel like it came from when I had my last mammogram. I think they injured my implant. It was so painful and since then I have noticed that the ridge of the left implant has hardened and is lumpy. They told me I needed to be seen so that I could have a diagnostic mammogram. They have me worried." Appointment scheduled for tomorrow at 11:15am with Verner Chol CNM for evaluation. Patient is agreeable to date and time.  Routing to provider for final review. Patient agreeable to disposition. Will close encounter

## 2014-06-26 NOTE — Telephone Encounter (Signed)
Patient calling stating, "I feel a change in my left breast and the [facility] refused to continue with my mammogram because of it."

## 2014-06-27 ENCOUNTER — Ambulatory Visit (INDEPENDENT_AMBULATORY_CARE_PROVIDER_SITE_OTHER): Payer: BLUE CROSS/BLUE SHIELD | Admitting: Certified Nurse Midwife

## 2014-06-27 ENCOUNTER — Encounter: Payer: Self-pay | Admitting: Certified Nurse Midwife

## 2014-06-27 VITALS — BP 96/60 | HR 68 | Resp 16 | Ht 64.0 in | Wt 147.0 lb

## 2014-06-27 DIAGNOSIS — N644 Mastodynia: Secondary | ICD-10-CM

## 2014-06-27 NOTE — Progress Notes (Signed)
   Subjective:   49 y.o. Legally Separated Caucasian female presents for evaluation of left breast change with implant?  Onset of the symptoms was several months since mammogram Patient sought evaluation because of was seen for mammogram and shared with the technician that she had implant change and  she did not complete the mammogram screening. due to technician saying she needed to see GYN.  Contributing factors include none. Denies any injury to breast or problems. Does SBE. Patient feels the implant was injured with last mammogram and which time patient said"she cried through the whole exam because it hurt so bad". Patient has had mammograms before and never had that problem, that is"why I told the tech about the implant". Denies nipple discharge, skin change or masses other than can feel edge of implant on left breast more now. Patient has saline implants which are ? 63 + years old. No other concerns today.  Review of Systems Pertinent items are noted in HPI.   Objective:   General appearance: alert, cooperative and appears stated age Breasts: normal appearance, no masses or tenderness, No nipple retraction or dimpling, No nipple discharge or bleeding, No axillary or supraclavicular adenopathy, implant edge prominent at 10-12 oclock on left breast, not tender to touch. Saline implant slightly palpable on right breast edge at 11 o'clock, non tender   Assessment:   ASSESSMENT:Saline implants bilateral with questionable change in left implant, ? leakage Normal breast exam  Plan:   PLAN: Discussed with patient that she needs diagnostic mammogram on left to assess if concern and possible [place]. Patient agreeable. Patient will be called with appointment. If leakage is noted patient will need to see Plastic surgeon for replacement if desires.

## 2014-06-27 NOTE — Patient Instructions (Signed)
Breast Screening Issues for Women With Breast Implants Regular breast screening is an important part of health care for women. Breast screening is usually done through mammography. Mammography is a noninvasive procedure in which an X-ray image (mammogram) is taken of your breasts. Abnormalities in your breast, such as tumors and cysts, can be found through mammography. Breast implants can complicate mammography. The implants may interfere with the result of the exam or evaluation of the exam result. ISSUES WITH MAMMOGRAPHY FOR WOMEN WITH IMPLANTS  The implant can hide or obscure breast tumors.   Scar tissue formed around the breast implant or the implant itself may prevent adequate compression of your breast during mammography.   Interpretation of the mammogram by the radiologist may be difficult because the implant can make it hard to see the underlying breast tissue.  The implant may rupture during mammography (uncommon).  Fear that the implant may rupture during mammography may discourage women from having the test.  ALTERNATIVE SCREENING TESTS FOR WOMEN WITH BREAST IMPLANTS Other tests that can be done for women with breast implants include:   MRI. An MRI allows your health care provider to see through your implants to the breast tissue and chest muscles. MRI is an excellent technique to detect implant leaks or ruptures.   Ultrasonography. Ultrasonography uses sound waves. It can also detect implant leaks and ruptures. An exam by your health care provider should be done yearly. Document Released: 04/14/2008 Document Revised: 05/07/2013 Document Reviewed: 11/20/2012 ExitCare Patient Information 2015 [employer]. This information is not intended to replace advice given to you by your health care provider. Make sure you discuss any questions you have with your health care provider.  

## 2014-06-27 NOTE — Progress Notes (Signed)
Reviewed personally.  M. Suzanne Fannie Gathright, MD.  

## 2014-06-30 ENCOUNTER — Telehealth: Payer: Self-pay | Admitting: Emergency Medicine

## 2014-06-30 DIAGNOSIS — N644 Mastodynia: Secondary | ICD-10-CM

## 2014-06-30 NOTE — Telephone Encounter (Signed)
Orders placed for bilateral dx imaging with implants and L Breast Ultrasound for L Breast tenderness. Patient due for screening.  Called to breast center and breast center is closed today to due to inclement weather.

## 2014-07-01 NOTE — Telephone Encounter (Signed)
Called patient and advised she is scheduled for imaging at [facility].  Appointment scheduled for 07/07/14 at 1300. Patient notified and agreeable. Routing to provider for final review. Patient agreeable to disposition. Will close encounter

## 2014-07-07 ENCOUNTER — Ambulatory Visit
Admission: RE | Admit: 2014-07-07 | Discharge: 2014-07-07 | Disposition: A | Discharge status: Home / Self Care | Payer: BLUE CROSS/BLUE SHIELD | Source: Ambulatory Visit | Attending: Certified Nurse Midwife | Admitting: Certified Nurse Midwife

## 2014-07-07 DIAGNOSIS — N644 Mastodynia: Secondary | ICD-10-CM

## 2014-07-10 ENCOUNTER — Telehealth: Payer: Self-pay | Admitting: Emergency Medicine

## 2014-07-10 NOTE — Telephone Encounter (Signed)
Spoke with patient. She is given message from Verner Chol CNM. She denies any complaints. She will call our office if she has any further concerns.    Routing to Dr. Edward Jolly to review and advise if okay to remove from Mammogram hold?

## 2014-07-10 NOTE — Telephone Encounter (Signed)
OK to remove from mammogram hold. Return to yearly screening mammogram.

## 2014-07-10 NOTE — Telephone Encounter (Signed)
-----   Message from Verner Chol, CNM sent at 07/08/2014  8:18 AM EST ----- Notify patient that bilateral diagnostic mammogram showed no abnormalities and was unchanged from last exam. [place] of left breast shows stable saline implants . If she is still having pain needs further evaluation with OV here with Dr. Hyacinth Meeker. She was concerned the last mammogram changed her left implant position. Density b Birads 1 negative Repeat one year

## 2014-07-25 ENCOUNTER — Encounter: Payer: Self-pay | Admitting: Certified Nurse Midwife

## 2014-07-25 ENCOUNTER — Ambulatory Visit (INDEPENDENT_AMBULATORY_CARE_PROVIDER_SITE_OTHER): Payer: BLUE CROSS/BLUE SHIELD | Admitting: Certified Nurse Midwife

## 2014-07-25 VITALS — BP 106/64 | HR 68 | Resp 16 | Ht 64.0 in | Wt 144.0 lb

## 2014-07-25 DIAGNOSIS — Z113 Encounter for screening for infections with a predominantly sexual mode of transmission: Secondary | ICD-10-CM

## 2014-07-25 DIAGNOSIS — T148 Other injury of unspecified body region: Secondary | ICD-10-CM | POA: Diagnosis not present

## 2014-07-25 DIAGNOSIS — T148XXA Other injury of unspecified body region, initial encounter: Secondary | ICD-10-CM

## 2014-07-25 DIAGNOSIS — B3731 Acute candidiasis of vulva and vagina: Secondary | ICD-10-CM

## 2014-07-25 DIAGNOSIS — B373 Candidiasis of vulva and vagina: Secondary | ICD-10-CM | POA: Diagnosis not present

## 2014-07-25 LAB — WET PREP BY MOLECULAR PROBE
CANDIDA SPECIES: POSITIVE — AB
GARDNERELLA VAGINALIS: NEGATIVE
TRICHOMONAS VAG: NEGATIVE

## 2014-07-25 MED ORDER — NYSTATIN-TRIAMCINOLONE 100000-0.1 UNIT/GM-% EX OINT: TOPICAL_OINTMENT | CUTANEOUS | Status: DC

## 2014-07-25 NOTE — Patient Instructions (Signed)

## 2014-07-25 NOTE — Progress Notes (Signed)
49 y.o.Legally Separated white female g2p2002 here with complaint of vaginal symptoms of itching, burning,external and tear in vaginal area from sexual activity. History of herpes but no outbreaks. Onset of symptoms 3 days ago. Used Dial soap which has made the area worse.  Has STD concerns and desires screening. Urinary symptoms none . Contraception is condoms. Tearing occurred with last sexual activity with hand stimulation. Denies any devices used or oral sexual activity. No other health issues today.   O:Healthy female WDWN Affect: normal, orientation x 3  Exam: Abdomen: non tender Lymph node: no enlargement or tenderness Pelvic exam: External genital: normal female with superficial tear noted at fourchette, appears to be healing no active bleeding, no lesions noted, scaling noted on perineal area down to rectum. Wet prep taken BUS: negative Vagina: slight pink discharge noted. Ph:4.0  , Affirm taken Cervix: normal, non tender, no CMT Uterus: normal, non tender Adnexa:normal, non tender, no masses or fullness noted   Wet Prep results: positive for yeast   A:Normal pelvic exam Yeast vulvitis Vaginal superficial tear  STD screening   P:Discussed findings of yeast vulvtis and etiology. Discussed Aveeno or baking soda sitz bath for comfort. Avoid moist clothes or pads for extended period of time. If working out in gym clothes or swim suits for long periods of time change underwear or bottoms of swimsuit if possible. Olive Oil/Coconut Oil use for skin protection prior to activity can be used to external skin. Rx: Mycolog ointment see order with instructions Discussed tear finding and need to apply coconut oil to area for protection and decrease hand stimulation to area, to prevent continued trauma.  Lab: Affirm, STD panel, Gc,Chlamydia, Hep C.  Rv prn

## 2014-07-26 LAB — STD PANEL
HIV 1&2 Ab, 4th Generation: NONREACTIVE
Hepatitis B Surface Ag: NEGATIVE

## 2014-07-26 LAB — HEPATITIS C ANTIBODY: HCV Ab: NEGATIVE

## 2014-07-28 NOTE — Progress Notes (Signed)
Reviewed personally.  M. Suzanne Constantin Hillery, MD.  

## 2014-07-29 LAB — IPS N GONORRHOEA AND CHLAMYDIA BY PCR

## 2014-07-30 ENCOUNTER — Other Ambulatory Visit: Payer: Self-pay | Admitting: Certified Nurse Midwife

## 2014-07-30 ENCOUNTER — Telehealth: Payer: Self-pay

## 2014-07-30 DIAGNOSIS — B373 Candidiasis of vulva and vagina: Secondary | ICD-10-CM

## 2014-07-30 DIAGNOSIS — B3731 Acute candidiasis of vulva and vagina: Secondary | ICD-10-CM

## 2014-07-30 MED ORDER — FLUCONAZOLE 150 MG PO TABS: 150.0000 mg | ORAL_TABLET | Freq: Once | ORAL | Status: DC

## 2014-07-30 NOTE — Telephone Encounter (Signed)
-----   Message from Verner Chol, CNM sent at 07/29/2014  5:23 PM EDT ----- Notify patient that GC,Chlamydia, HIV,RPR,Hep B,C are negative Affirm positive for yeast only, needs  Rx Terazol  7 cream see order in

## 2014-07-30 NOTE — Telephone Encounter (Signed)
LMTCB

## 2014-07-30 NOTE — Telephone Encounter (Signed)
Ms. Erika Mccoy please approved rx for Diflucan for patient, pharmacy already entered for you along with medication.

## 2014-07-30 NOTE — Telephone Encounter (Signed)
Patient calling with pharmacy info. [facility] [address] [place], [place] [address] [phone]

## 2014-07-30 NOTE — Telephone Encounter (Signed)
Diflucan 150 mg #1/0 rfs sent to [facility], lm on patient's vm that rx has been sent (see result note)

## 2014-08-07 ENCOUNTER — Telehealth: Payer: Self-pay | Admitting: Certified Nurse Midwife

## 2014-08-07 NOTE — Telephone Encounter (Signed)
Spoke with patient. Patient states that she started her cycle on 3/11 which lasted 5 days. Patient started cycle again today and is having "more cramping than usual." Patient is changing pad 2 times a day. Not currently on any form of birth control. "Intercourse has been painful lately too. It is not pain on the outside but more deep inside. There is something going on." Cramping is relieved with ibuprofen. Has also been experiencing increased hair loss over the last few months. Patient is going to see GP for this. "I am not sure if it could be related or not." Advised patient will need to be seen for further evaluation with Verner Chol CNM. Patient is agreeable. Appointment scheduled for 3/31 at 2pm. Patient is agreeable to date and time.  Routing to provider for final review. Patient agreeable to disposition. Will close encounter

## 2014-08-07 NOTE — Telephone Encounter (Signed)
Pt says she's having painful periods.

## 2014-08-14 ENCOUNTER — Ambulatory Visit (INDEPENDENT_AMBULATORY_CARE_PROVIDER_SITE_OTHER): Payer: BLUE CROSS/BLUE SHIELD | Admitting: Certified Nurse Midwife

## 2014-08-14 ENCOUNTER — Other Ambulatory Visit: Payer: Self-pay | Admitting: Obstetrics and Gynecology

## 2014-08-14 ENCOUNTER — Encounter: Payer: Self-pay | Admitting: Certified Nurse Midwife

## 2014-08-14 VITALS — BP 98/62 | HR 68 | Resp 16 | Ht 64.0 in | Wt 143.0 lb

## 2014-08-14 DIAGNOSIS — A499 Bacterial infection, unspecified: Secondary | ICD-10-CM | POA: Diagnosis not present

## 2014-08-14 DIAGNOSIS — N760 Acute vaginitis: Secondary | ICD-10-CM | POA: Diagnosis not present

## 2014-08-14 DIAGNOSIS — Z01419 Encounter for gynecological examination (general) (routine) without abnormal findings: Secondary | ICD-10-CM

## 2014-08-14 DIAGNOSIS — B9689 Other specified bacterial agents as the cause of diseases classified elsewhere: Secondary | ICD-10-CM

## 2014-08-14 MED ORDER — METRONIDAZOLE 500 MG PO TABS: 500.0000 mg | ORAL_TABLET | Freq: Two times a day (BID) | ORAL | Status: DC

## 2014-08-14 NOTE — Telephone Encounter (Signed)
Medication refill request: Vitamin D 50,000 iu's Last AEX:  01/28/14 with TL Next AEX: 08/14/14 with DL  Last MMG (if hormonal medication request): N/A Refill authorized: Please advise.

## 2014-08-14 NOTE — Telephone Encounter (Signed)
Patient is coming back in for Vitamin D recheck

## 2014-08-14 NOTE — Progress Notes (Signed)
49 y.o.Legally Separated white female g2p2002 here with complaint of vaginal symptoms of itching, and increase discharge. Describes discharge as white and odorous. Onset of symptoms 8 days ago. Denies new personal products or vaginal dryness. No STD concerns. Urinary symptoms none . Contraception is condoms. Patient had pelvic pain with intercourse with deep penetration, which resolved quickly after. Concerned she may have a problem. STD screening 07/25/14 all negative. No concerns regarding STDS. No other health issues today.   O:Healthy female WDWN Affect: normal, orientation x 3  Exam: Abdomen:non tender Lymph node: no enlargement or tenderness Pelvic exam: External genital: normal female, no lesions BUS: negative Vagina: white odorous discharge noted. Ph:5.0  ,Wet prep taken,  Cervix: normal, non tender, no CMT Uterus: normal, non tender Adnexa:normal, non tender, no masses or fullness noted   Wet Prep results: positive clue cells   A:Normal pelvic exam BV    P:Reassured normal pelvic exam. Discussed findings of BV and etiology. Discussed Aveeno or baking soda sitz bath for comfort. Discussed vaginal pain can occur with bacteria present and reminded of the healing laceration seen at her last visit. Questions addressed Rx: Flagyl see order  Rv prn

## 2014-08-14 NOTE — Patient Instructions (Signed)
Bacterial Vaginosis Bacterial vaginosis is a vaginal infection that occurs when the normal balance of bacteria in the vagina is disrupted. It results from an overgrowth of certain bacteria. This is the most common vaginal infection in women of childbearing age. Treatment is important to prevent complications, especially in pregnant women, as it can cause a premature delivery. CAUSES  Bacterial vaginosis is caused by an increase in harmful bacteria that are normally present in smaller amounts in the vagina. Several different kinds of bacteria can cause bacterial vaginosis. However, the reason that the condition develops is not fully understood. RISK FACTORS Certain activities or behaviors can put you at an increased risk of developing bacterial vaginosis, including:  Having a new sex partner or multiple sex partners.  Douching.  Using an intrauterine device (IUD) for contraception. Women do not get bacterial vaginosis from toilet seats, bedding, swimming pools, or contact with objects around them. SIGNS AND SYMPTOMS  Some women with bacterial vaginosis have no signs or symptoms. Common symptoms include:  Grey vaginal discharge.  A fishlike odor with discharge, especially after sexual intercourse.  Itching or burning of the vagina and vulva.  Burning or pain with urination. DIAGNOSIS  Your health care provider will take a medical history and examine the vagina for signs of bacterial vaginosis. A sample of vaginal fluid may be taken. Your health care provider will look at this sample under a microscope to check for bacteria and abnormal cells. A vaginal pH test may also be done.  TREATMENT  Bacterial vaginosis may be treated with antibiotic medicines. These may be given in the form of a pill or a vaginal cream. A second round of antibiotics may be prescribed if the condition comes back after treatment.  HOME CARE INSTRUCTIONS   Only take over-the-counter or prescription medicines as  directed by your health care provider.  If antibiotic medicine was prescribed, take it as directed. Make sure you finish it even if you start to feel better.  Do not have sex until treatment is completed.  Tell all sexual partners that you have a vaginal infection. They should see their health care provider and be treated if they have problems, such as a mild rash or itching.  Practice safe sex by using condoms and only having one sex partner. SEEK MEDICAL CARE IF:   Your symptoms are not improving after 3 days of treatment.  You have increased discharge or pain.  You have a fever. MAKE SURE YOU:   Understand these instructions.  Will watch your condition.  Will get help right away if you are not doing well or get worse. FOR MORE INFORMATION  Centers for Disease Control and Prevention, Division of STD Prevention: www.cdc.gov/std American Sexual Health Association ([name]): www.ashastd.org  Document Released: 05/02/2005 Document Revised: 02/20/2013 Document Reviewed: 12/12/2012 Exit[employer] Patient Information 2015 [employer]. This information is not intended to replace advice given to you by your health care provider. Make sure you discuss any questions you have with your health care provider.  

## 2014-08-14 NOTE — Progress Notes (Signed)
Reviewed personally.  M. Suzanne Roma Bondar, MD.  

## 2014-08-21 ENCOUNTER — Telehealth: Payer: Self-pay | Admitting: Certified Nurse Midwife

## 2014-08-21 NOTE — Telephone Encounter (Signed)
Spoke with patient. Patient states she has completed Flagyl for treatment of BV. Last seen 08/14/2014 with Verner Chol CNM. "I am still having the exact same symptoms. I have not noticed a difference. I am still having itching, discharge, and my bottom is sore." States that discharge is yellow in color. "I just think I will never get well down there. Something is always going on." Patient is leaving for [place] until June this Sunday. Advised will need to be seen for follow up appointment here in the office for further evaluation. Patient is agreeable. Appointment scheduled for tomorrow 4/8 at 9:45am with Dr.Silva. Patient is agreeable.   Routing to provider for final review. Patient agreeable to disposition. Will close encounter

## 2014-08-21 NOTE — Telephone Encounter (Signed)
Patient states she is still not better down there. She wants to find out what is wrong why she is sick so much.

## 2014-08-22 ENCOUNTER — Encounter: Payer: Self-pay | Admitting: Obstetrics and Gynecology

## 2014-08-22 ENCOUNTER — Ambulatory Visit (INDEPENDENT_AMBULATORY_CARE_PROVIDER_SITE_OTHER): Payer: BLUE CROSS/BLUE SHIELD | Admitting: Obstetrics and Gynecology

## 2014-08-22 VITALS — BP 100/64 | HR 88 | Ht 64.0 in | Wt 143.0 lb

## 2014-08-22 DIAGNOSIS — Z8742 Personal history of other diseases of the female genital tract: Secondary | ICD-10-CM | POA: Diagnosis not present

## 2014-08-22 DIAGNOSIS — N760 Acute vaginitis: Secondary | ICD-10-CM

## 2014-08-22 DIAGNOSIS — E559 Vitamin D deficiency, unspecified: Secondary | ICD-10-CM

## 2014-08-22 NOTE — Progress Notes (Signed)
Patient ID: Erika Mccoy, female   DOB: 1965-10-09, 49 y.o.   MRN: [id number] GYNECOLOGY VISIT  PCP: Asencion Partridge, MD  Referring provider:   HPI: 49 y.o.   Legally Separated  Caucasian  female   G2P2 with Patient's last menstrual period was 07/25/2014.   here for vaginal itching and discharge.  She was treated for BV on 08-14-14 but patient states symptoms have not resolved.  Affirm was positive for candida on 07/28/14.  Treated for bacterial vaginosis on 08/14/14. Frustrated with constant discharge.   Dealing mostly with yeast infection.  Diflucan 2 - 3 days apart works the best for the patient.  Allergy to Monistat.  Itches during the day.  Notes a yeast like odor.  Does not like Flagyl due to taste in her mouth.  Had negative STD testing including negative GC/CT on 07/25/14.  Also has lichen sclerosus.  Is not currently using the Clobetasol ointment.  Tore the perineal area from intercourse. Uses Clobetasol only as indicated.  Uses Mycolog recently to treat the tear; it healed up.   Is bothered by the moisture.  Also asking about incontinence issues and what to do.   Also wants discussion about HPV.  Did not have a pap at her last annual exam and patient is questioning this.   Leaving for [place].  Is engaged to be married and will return to [facility] periodically.   Getting on a plane on 08/24/14 and wants solutions before she goes.  GYNECOLOGIC HISTORY: Patient's last menstrual period was 07/25/2014. Sexually active:  yes Partner preference: female Contraception:  ablation Menopausal hormone therapy: n/a DES exposure:    Blood transfusions:   no Sexually transmitted diseases:  Hx HSV II, genital warts. HPV GYN procedures and prior surgeries:  Cervical conization 1997, endometrial ablation, breast augmentation Last mammogram: 07-07-14 breast implants--fibroglandular density/nl and U/S also normal: [facility]             Last pap and high risk HPV testing:  10-26-11 wnl: HR HPV   History of abnormal pap smear: yes, hx of cervical conization     OB History    Gravida Para Term Preterm AB TAB SAB Ectopic Multiple Living   Past Medical History  Diagnosis Date  . Interstitial cystitis   . Fibromyalgia   . Anxiety   . Lichen sclerosus of female genitalia   . Manic depression   . HSV-2 infection     rare occurence  . MS (multiple sclerosis)   . IBS (irritable bowel syndrome)   . History of self mutilation   . Arm sprain 5/11    right   . Lichen sclerosus     Vulva    Past Surgical History  Procedure Laterality Date  . Endometrial ablation  1997/1998  . Breast enhancement surgery  1990    Current Outpatient Prescriptions  Medication Sig Dispense Refill  . clonazePAM (KLONOPIN) 2 MG tablet Take 1 tablet (2 mg total) by mouth daily. 30 tablet   . cyclobenzaprine (FLEXERIL) 10 MG tablet as needed.    . fluticasone (FLONASE) 50 MCG/ACT nasal spray   0  . hydrOXYzine (ATARAX/VISTARIL) 25 MG tablet   0  . lamoTRIgine (LAMICTAL) 100 MG tablet   1  . montelukast (SINGULAIR) 10 MG tablet Take 1 tablet (10 mg total) by mouth every morning.    Marland Kitchen omeprORION VANDERVORT) 20 MG capsule Take  20 mg by mouth.    . topiramate (TOPAMAX) 50 MG tablet   1  . traZODone (DESYREL) 100 MG tablet Take 2 tablets (200 mg total) by mouth at bedtime.    . Vitamin D, Ergocalciferol, (DRISDOL) 50000 UNITS CAPS capsule TAKE 1 CAPSULE BY MOUTH ONCE A WEEK(PICK THE SAME DAY EACH WEEK) 12 capsule 0   No current facility-administered medications for this visit.     ALLERGIES: Cefzil; Ciprofloxacin; Lidocaine; Monistat; Neosporin; Quinolones; Septra; Valtrex; and Adhesive  History reviewed. No pertinent family history.  History   Social History  . Marital Status: Legally Separated    Spouse Name: N/A  . Number of Children: N/A  . Years of Education: N/A   Occupational History  . Not on file.   Social History Main Topics  . Smoking  status: Never Smoker   . Smokeless tobacco: Never Used  . Alcohol Use: 0.6 oz/week    1 Standard drinks or equivalent per week  . Drug Use: No  . Sexual Activity:    Partners: Male    Birth Control/ Protection: Surgical     Comment: Ablation   Other Topics Concern  . Not on file   Social History Narrative    ROS:  Pertinent items are noted in HPI.  PHYSICAL EXAMINATION:    BP 100/64 mmHg  Pulse 88  Ht 5\' 4"  (1.626 m)  Wt 143 lb (64.864 kg)  BMI 24.53 kg/m2  LMP 07/25/2014   Wt Readings from Last 3 Encounters:  08/22/14 143 lb (64.864 kg)  08/14/14 143 lb (64.864 kg)  07/25/14 144 lb (65.318 kg)     Ht Readings from Last 3 Encounters:  08/22/14 5\' 4"  (1.626 m)  08/14/14 5\' 4"  (1.626 m)  07/25/14 5\' 4"  (1.626 m)    General appearance: alert, cooperative and appears stated age   Pelvic: External genitalia:  no lesions              Urethra:  normal appearing urethra with no masses, tenderness or lesions              Bartholins and Skenes: normal                 Vagina: normal appearing vagina with normal color and discharge, no lesions              Cervix: normal appearance                 Bimanual Exam:  Uterus:  uterus is normal size, shape, consistency and nontender                                      Adnexa: normal adnexa in size, nontender and no masses                                    Wet prep - ph 4.5, negative yeast, clue cells and trichomonas.  Lactobacilli noted.   ASSESSMENT  Chronic vaginitis.  Discharge is what is most bothersome.  Negative wet prep today. Urinary incontinence.  History of cervical dysplasia. Lichen sclerosus.  No lesions.  Low Vit D.   PLAN  Discussion of vaginitis.  I am recommending new treatment with boric acid and probiotics intravaginally nightly for 6 nights. Rx to patient which she will likely need to fill in  [place] as pharmacies are apparently out of this in [place].  This explained to patient by nurse. HgbA1C.   Added Vit D to lab draw as this was a future order to recheck her low Vit D level.  Encouraged to use the least amount of products on her vulva as possible to avoid dermatitis.  Discussion of pap guidelines and HPV testing.  Can have pap/HR HPV cotesting with next exam.   An After Visit Summary was printed and given to the patient.  25 minutes face to face time of which over 50% was spent in counseling.

## 2014-08-23 ENCOUNTER — Encounter (HOSPITAL_COMMUNITY): Payer: Self-pay | Admitting: Emergency Medicine

## 2014-08-23 ENCOUNTER — Emergency Department (HOSPITAL_COMMUNITY)
Admission: EM | Admit: 2014-08-23 | Discharge: 2014-08-23 | Disposition: A | Discharge status: Home / Self Care | Payer: BLUE CROSS/BLUE SHIELD | Attending: Emergency Medicine | Admitting: Emergency Medicine

## 2014-08-23 DIAGNOSIS — F419 Anxiety disorder, unspecified: Secondary | ICD-10-CM | POA: Diagnosis not present

## 2014-08-23 DIAGNOSIS — F319 Bipolar disorder, unspecified: Secondary | ICD-10-CM | POA: Insufficient documentation

## 2014-08-23 DIAGNOSIS — Z8619 Personal history of other infectious and parasitic diseases: Secondary | ICD-10-CM | POA: Insufficient documentation

## 2014-08-23 DIAGNOSIS — Z87448 Personal history of other diseases of urinary system: Secondary | ICD-10-CM | POA: Diagnosis not present

## 2014-08-23 DIAGNOSIS — Z872 Personal history of diseases of the skin and subcutaneous tissue: Secondary | ICD-10-CM | POA: Insufficient documentation

## 2014-08-23 DIAGNOSIS — W260XXA Contact with knife, initial encounter: Secondary | ICD-10-CM | POA: Insufficient documentation

## 2014-08-23 DIAGNOSIS — Z23 Encounter for immunization: Secondary | ICD-10-CM | POA: Insufficient documentation

## 2014-08-23 DIAGNOSIS — Z7952 Long term (current) use of systemic steroids: Secondary | ICD-10-CM | POA: Diagnosis not present

## 2014-08-23 DIAGNOSIS — Z8719 Personal history of other diseases of the digestive system: Secondary | ICD-10-CM | POA: Diagnosis not present

## 2014-08-23 DIAGNOSIS — Z79899 Other long term (current) drug therapy: Secondary | ICD-10-CM | POA: Diagnosis not present

## 2014-08-23 DIAGNOSIS — S91012A Laceration without foreign body, left ankle, initial encounter: Secondary | ICD-10-CM | POA: Diagnosis not present

## 2014-08-23 DIAGNOSIS — Y9289 Other specified places as the place of occurrence of the external cause: Secondary | ICD-10-CM | POA: Insufficient documentation

## 2014-08-23 DIAGNOSIS — Y998 Other external cause status: Secondary | ICD-10-CM | POA: Diagnosis not present

## 2014-08-23 DIAGNOSIS — Y9389 Activity, other specified: Secondary | ICD-10-CM | POA: Insufficient documentation

## 2014-08-23 DIAGNOSIS — S99912A Unspecified injury of left ankle, initial encounter: Secondary | ICD-10-CM | POA: Diagnosis present

## 2014-08-23 LAB — HEMOGLOBIN A1C
Hgb A1c MFr Bld: 5.7 % — ABNORMAL HIGH (ref <5.7)
Mean Plasma Glucose: 117 mg/dL — ABNORMAL HIGH (ref <117)

## 2014-08-23 LAB — VITAMIN D 25 HYDROXY (VIT D DEFICIENCY, FRACTURES): VIT D 25 HYDROXY: 33 ng/mL (ref 30–100)

## 2014-08-23 MED ORDER — LIDOCAINE-EPINEPHRINE (PF) 1 %-1:200000 IJ SOLN
INTRAMUSCULAR | Status: AC
  Filled 2014-08-23: qty 30

## 2014-08-23 MED ORDER — TETANUS-DIPHTH-ACELL PERTUSSIS 5-2.5-18.5 LF-MCG/0.5 IM SUSP
0.5000 mL | Freq: Once | INTRAMUSCULAR | Status: AC
  Administered 2014-08-23: 0.5 mL via INTRAMUSCULAR
  Filled 2014-08-23: qty 0.5

## 2014-08-23 MED ORDER — LIDOCAINE-EPINEPHRINE (PF) 2 %-1:200000 IJ SOLN: 10.0000 mL | Freq: Once | INTRAMUSCULAR | Status: DC
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
## 2014-08-23 NOTE — Discharge Instructions (Signed)
Sutured Wound Care °Sutures are stitches that can be used to close wounds. Wound care helps prevent pain and infection.  °HOME CARE INSTRUCTIONS  °· Rest and elevate the injured area until all the pain and swelling are gone. °· Only take over-the-counter or prescription medicines for pain, discomfort, or fever as directed by your caregiver. °· After 48 hours, gently wash the area with mild soap and water once a day, or as directed. Rinse off the soap. Pat the area dry with a clean towel. Do not rub the wound. This may cause bleeding. °· Follow your caregiver's instructions for how often to change the bandage (dressing). Stop using a dressing after 2 days or after the wound stops draining. °· If the dressing sticks, moisten it with soapy water and gently remove it. °· Apply ointment on the wound as directed. °· Avoid stretching a sutured wound. °· Drink enough fluids to keep your urine clear or pale yellow. °· Follow up with your caregiver for suture removal as directed. °· Use sunscreen on your wound for the next 3 to 6 months so the scar will not darken. °SEEK IMMEDIATE MEDICAL CARE IF:  °· Your wound becomes red, swollen, hot, or tender. °· You have increasing pain in the wound. °· You have a red streak that extends from the wound. °· There is pus coming from the wound. °· You have a fever. °· You have shaking chills. °· There is a bad smell coming from the wound. °· You have persistent bleeding from the wound. °MAKE SURE YOU:  °· Understand these instructions. °· Will watch your condition. °· Will get help right away if you are not doing well or get worse. °Document Released: 06/09/2004 Document Revised: 07/25/2011 Document Reviewed: 09/05/2010 °Exit[employer]® Patient Information ©2015 [employer]. This information is not intended to replace advice given to you by your health care provider. Make sure you discuss any questions you have with your health care provider. ° °
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
## 2014-08-24 ENCOUNTER — Other Ambulatory Visit: Payer: Self-pay | Admitting: Obstetrics and Gynecology

## 2014-08-24 DIAGNOSIS — R7309 Other abnormal glucose: Secondary | ICD-10-CM

## 2014-11-10 ENCOUNTER — Telehealth: Payer: Self-pay | Admitting: *Deleted

## 2014-11-10 ENCOUNTER — Other Ambulatory Visit: Payer: Self-pay | Admitting: *Deleted

## 2014-11-10 NOTE — Telephone Encounter (Signed)
Patient has been treated for lichen sclerosus.  I am Ok giving her one small tube of Clobetasol 0.05% to area bid for two weeks.  No refills.  I do not know what pharmacy she is using because I believe [place] is [place].

## 2014-11-10 NOTE — Telephone Encounter (Signed)
Refill request by fax came in for Clobetasol 0.05% oint. This is not on her current med list. It looks like we last wrote this for her in 2014.  Last AEX: 01-28-14 Next AEX: not scheduled  Please Advise Thanks

## 2014-11-11 MED ORDER — CLOBETASOL PROPIONATE 0.05 % EX OINT: TOPICAL_OINTMENT | CUTANEOUS | Status: DC

## 2014-12-08 ENCOUNTER — Emergency Department (HOSPITAL_COMMUNITY)
Admission: EM | Admit: 2014-12-08 | Discharge: 2014-12-08 | Disposition: A | Discharge status: Home / Self Care | Payer: BLUE CROSS/BLUE SHIELD | Attending: Physician Assistant | Admitting: Physician Assistant

## 2014-12-08 ENCOUNTER — Encounter (HOSPITAL_COMMUNITY): Payer: Self-pay | Admitting: Emergency Medicine

## 2014-12-08 DIAGNOSIS — F329 Major depressive disorder, single episode, unspecified: Secondary | ICD-10-CM | POA: Diagnosis not present

## 2014-12-08 DIAGNOSIS — F419 Anxiety disorder, unspecified: Secondary | ICD-10-CM | POA: Insufficient documentation

## 2014-12-08 DIAGNOSIS — Z8781 Personal history of (healed) traumatic fracture: Secondary | ICD-10-CM | POA: Insufficient documentation

## 2014-12-08 DIAGNOSIS — Z79899 Other long term (current) drug therapy: Secondary | ICD-10-CM | POA: Insufficient documentation

## 2014-12-08 DIAGNOSIS — Z87448 Personal history of other diseases of urinary system: Secondary | ICD-10-CM | POA: Insufficient documentation

## 2014-12-08 DIAGNOSIS — Z8619 Personal history of other infectious and parasitic diseases: Secondary | ICD-10-CM | POA: Diagnosis not present

## 2014-12-08 DIAGNOSIS — M79602 Pain in left arm: Secondary | ICD-10-CM | POA: Insufficient documentation

## 2014-12-08 DIAGNOSIS — M797 Fibromyalgia: Secondary | ICD-10-CM | POA: Diagnosis not present

## 2014-12-08 DIAGNOSIS — K589 Irritable bowel syndrome without diarrhea: Secondary | ICD-10-CM | POA: Diagnosis not present

## 2014-12-08 MED ORDER — NAPROXEN 500 MG PO TABS: 500.0000 mg | ORAL_TABLET | Freq: Two times a day (BID) | ORAL | Status: DC

## 2014-12-08 NOTE — ED Provider Notes (Signed)
[name]: [id number]     Arrival date & time 12/08/14  1632 History   This chart was scribed for non-physician practitioner Everlene Farrier PA-C working with Abelino Derrick, MD by Lyndel Safe, ED Scribe. This patient was seen in room WTR7/WTR7 and the patient's care was started at 6:05 PM.    Chief Complaint  Patient presents with  . Arm Pain   The history is provided by the patient. No language interpreter was used.   HPI Comments: Erika Mccoy is a 49 y.o. female, with a PMhx of anxiety, manic depression, self mutulation and fibromyalgia, who presents to the Emergency Department complaining of gradually worsening, constant, moderate left arm pain s/p fall that occurred 18 days ago. She states the pain is worse in her left wrist but present throughout her entire left arm. The pain is exacerbated with any movement of left arm. She reports she has been taking prescribed Vicodin with mild to no relief. Pt reports she fell 18 days ago, fracturing her left radial bone and obtaining various other arthralgias and myalgias. She states she was evaluated by an orthopedist, where she received diagnostic imaging, after the fall who placed her left arm in a fiber glass splint. The ortho doctor removed the splint 4 days ago and referred her to physical therapy. She reports the constant pain has become worse since the removal of the splint. The orthopedic doctor recommended that the pt attend physical therapy after the removal of the splint but the pt and husband recently moved to [facility] and are preparing to travel on their honeymoon. She is worried about being unable to partake in activities while on her honeymoon; requesting a removable splint. Pt states she is followed by Dr. Leotis Shames with [facility]. Denies wound to left arm. Denies fevers, chills, numbness or tingling or new injury.   Past Medical History  Diagnosis Date  . Interstitial cystitis   . Fibromyalgia   . Anxiety   . Lichen  sclerosus of female genitalia   . Manic depression   . HSV-2 infection     rare occurence  . MS (multiple sclerosis)   . IBS (irritable bowel syndrome)   . History of self mutilation   . Arm sprain 5/11    right   . Lichen sclerosus     Vulva  . STD (sexually transmitted disease) 1989    Hx of HSV II, genital warts  . Genital warts   . Abnormal Pap smear of cervix 1997    --hx of conization of cervix by Dr. Roberto Scales   Past Surgical History  Procedure Laterality Date  . Endometrial ablation  1997/1998  . Breast enhancement surgery  1990  . Cervix lesion destruction  1997    Dr. Roberto Scales   No family history on file. History  Substance Use Topics  . Smoking status: Never Smoker   . Smokeless tobacco: Never Used  . Alcohol Use: 0.6 oz/week    1 Standard drinks or equivalent per week   OB History    Gravida Para Term Preterm AB TAB SAB Ectopic Multiple Living   2 2       1 3      Review of Systems  Constitutional: Negative for fever and chills.  Musculoskeletal: Positive for myalgias and arthralgias. Negative for joint swelling.  Skin: Negative for wound.  Neurological: Negative for weakness and numbness.    Allergies  Cefzil; Ciprofloxacin; Lidocaine; Monistat; Neosporin; Quinolones; Septra; Valtrex; and Adhesive  Home Medications  Prior to Admission medications   Medication Sig Start Date End Date Taking? Authorizing Provider  clobetasol ointment (TEMOVATE) 0.05 % Apply a pea size amount to affected areas once daily Patient taking differently: Apply 1 application topically daily as needed (flare up).  11/11/14  Yes Brook E Ardell Isaacs, MD  clonazePAM (KLONOPIN) 2 MG tablet Take 1 tablet (2 mg total) by mouth daily. 01/20/14  Yes Charm Rings, NP  dicyclomine (BENTYL) 10 MG capsule Take 10 mg by mouth daily as needed. 11/24/14  Yes Historical Provider, MD  ELMIRON 100 MG capsule Take 200 mg by mouth at bedtime. 11/20/14  Yes Historical Provider, MD   HYDROcodone-acetaminophen (NORCO/VICODIN) 5-325 MG per tablet Take 1 tablet by mouth every 4 (four) hours as needed for moderate pain or severe pain.  11/24/14  Yes Historical Provider, MD  hydrOXYzine (ATARAX/VISTARIL) 25 MG tablet Take 25 mg by mouth 2 (two) times daily as needed (bladder).  06/14/14  Yes Historical Provider, MD  lamoTRIgine (LAMICTAL) 150 MG tablet Take 150 mg by mouth at bedtime.   Yes Historical Provider, MD  montelukast (SINGULAIR) 10 MG tablet Take 1 tablet (10 mg total) by mouth every morning. 01/20/14  Yes Charm Rings, NP  omeprazole (PRILOSEC) 20 MG capsule Take 20 mg by mouth. 08/12/14  Yes Historical Provider, MD  topiramate (TOPAMAX) 50 MG tablet Take 50 mg by mouth at bedtime.  07/01/14  Yes Historical Provider, MD  traZODone (DESYREL) 100 MG tablet Take 2 tablets (200 mg total) by mouth at bedtime. 01/20/14  Yes Charm Rings, NP  naproxen (NAPROSYN) 500 MG tablet Take 1 tablet (500 mg total) by mouth 2 (two) times daily with a meal. 12/08/14   Everlene Farrier, PA-C  Vitamin D, Ergocalciferol, (DRISDOL) 50000 UNITS CAPS capsule TAKE 1 CAPSULE BY MOUTH ONCE A WEEK(PICK THE SAME DAY EACH WEEK) Patient not taking: Reported on 12/08/2014 08/14/14   Verner Chol, CNM   BP 102/67 mmHg  Pulse 75  Temp(Src) 98.8 F (37.1 C) (Oral)  Resp 22  Wt 135 lb (61.236 kg)  SpO2 100%  LMP  (Approximate) Physical Exam  Constitutional: She is oriented to person, place, and time. She appears well-developed and well-nourished. No distress.  Nontoxic appearing.  HENT:  Head: Normocephalic and atraumatic.  Eyes: Right eye exhibits no discharge. Left eye exhibits no discharge.  Cardiovascular: Normal rate, regular rhythm, normal heart sounds and intact distal pulses.   Bilateral radial pulses are intact.  Pulmonary/Chest: Effort normal and breath sounds normal. No respiratory distress.  Musculoskeletal: Normal range of motion. She exhibits tenderness. She exhibits no edema.  Patient  has good and equal grip strengths bilaterally. Patient has tenderness over her left wrist and left elbow. There is no deformity noted. No edema. No ecchymosis or erythema. Patient is spontaneously moving all extremities in a coordinated fashion exhibiting good strength.   Neurological: She is alert and oriented to person, place, and time. Coordination normal.  Sensation is intact to her bilateral upper extremities.  Skin: No rash noted. She is not diaphoretic.  Psychiatric: She has a normal mood and affect. Her behavior is normal.  Nursing note and vitals reviewed.   ED Course  Procedures  DIAGNOSTIC STUDIES: Oxygen Saturation is 100% on RA, normal by my interpretation.    COORDINATION OF CARE: 6:13 PM Discussed treatment plan which includes to consult with attending Dr. Corlis Leak with pt. Pt acknowledges and agrees to plan.  6:22 PM  Pt decline repeat Xray  of left arm and declined ace bandage. Agreed to start taking naproxen as she has only been taking Vicodin for pain control. Discussed with pt to follow up with orthopedic surgeon and physical therapy. Pt acknowledges and agrees to plan.   Labs Review Labs Reviewed - No data to display  Imaging Review No results found.   EKG Interpretation None      Filed Vitals:   12/08/14 1635  BP: 102/67  Pulse: 75  Temp: 98.8 F (37.1 C)  TempSrc: Oral  Resp: 22  Weight: 135 lb (61.236 kg)  SpO2: 100%     MDM   Meds given in ED:  Medications - No data to display  Discharge Medication List as of 12/08/2014  6:22 PM    START taking these medications   Details  naproxen (NAPROSYN) 500 MG tablet Take 1 tablet (500 mg total) by mouth 2 (two) times daily with a meal., Starting 12/08/2014, Until Discontinued, Print        Final diagnoses:  Left arm pain   This is a 49 year old female who presents to the emergency department complaining of continued left arm pain after a fracture of her radial head 3 weeks ago. The patient reports  [place] she fell 3 weeks ago fracturing her radial head. She reports [place] orthopedic surgeon out of state who placed her in a fiberglass splint for the past 2 weeks. She reports this was removed 4 days ago and orthopedic surgeon referred her to physical therapy. She has not been to physical therapy and she is having continued pain. The patient is requesting that we place a fiberglass removable splint on her arm that she can remove while on her honeymoon while snorkeling or swimming. On exam the patient is afebrile and nontoxic appearing. Patient is spontaneously moving all extremities in a coordinated fashion exhibiting good strength.  Patient does have tenderness to her left elbow and wrist. There is no left arm edema, erythema, deformity or ecchymosis noted. I offered to have repeat x-rays performed in ED the patient declined. I offered to give the patient a foam arm sling and the patient declined. I offered the patient a wrist bandage and the patient declined. Patient is repetitively requesting a fiberglass removable splint that she can remove while swimming or snorkeling. I explained that we did not have any of these and she is upset that we cannot give this to her. I offered her naproxen for her pain and the patient does read to take naproxen. I encouraged her to follow-up with her orthopedic surgeon and with physical therapy. I advised the patient to follow-up with their primary care provider this week. I advised the patient to return to the emergency department with new or worsening symptoms or new concerns. The patient verbalized understanding and agreement with plan.    I personally performed the services described in this documentation, which was scribed in my presence. The recorded information has been reviewed and is accurate.      Everlene Farrier, PA-C 12/09/14 2025  Courteney Randall An, MD 12/10/14 0005

## 2014-12-08 NOTE — Discharge Instructions (Signed)
Previous Radial Head Fracture A radial head fracture is a break of the smaller bone (radius) in the forearm. The head of this bone is the part near the elbow. These fractures commonly happen during a fall, when you land on an outstretched arm. These fractures are more common in middle aged adults and are common with a dislocation of the elbow. SYMPTOMS   Swelling of the elbow joint and pain on the outside of the elbow.  Pain and difficulty in bending or straightening the elbow.  Pain and difficulty in turning the palm of the hand up or down with the elbow bent. DIAGNOSIS  Your caregiver may make this diagnosis by a physical exam. X-rays can confirm the type and amount of fracture. Sometimes a fracture that is not displaced cannot be seen on the original X-ray. TREATMENT  Radial head fractures are classified according to the amount of movement (displacement) of parts from the normal position.  Type 1 Fractures  Type 1 fractures are generally small fractures in which bone pieces remain together (nondisplaced fracture).  The fracture may not be seen on initial X-rays. Usually if X-rays are repeated two to three weeks later, the fracture will show up. A splint or sling is used for a few days. Gentle early motion is used to prevent the elbow from becoming stiff. It should not be done vigorously or forced as this could displace the bone pieces. Type 2 Fractures  With type 2 fractures, bone pieces are slightly displaced and larger pieces of bone are broken off.  If only a little displacement of the bone piece is present, splinting for 4 to 5 days usually works well. This is again followed with gentle active range of motion. Small fragments may be surgically removed.  Large pieces of bone that can be put back into place will sometimes be fixed with pins or screws to hold them until the bone is healed. If this cannot be done, the fragments are removed. For older, less active people, sometimes the  entire radial head is removed if the wrist is not injured. The elbow and arm will still work fine. Soft tissue, tendon, and ligament injuries are corrected at the same time. Type 3 Fractures  Type 3 fractures have multiple broken pieces of bone that cannot be fixed. Surgery is usually needed to remove the broken bits of bone and what is left of the radial head. Soft-tissue damage is repaired. Gentle early motion is used to prevent the elbow from becoming stiff. Sometimes an artificial radial head can be used to prevent deformity if the elbow is unstable. Rest, ice, elevation, immobilization, medications, and pain control are used in the early care. HOME CARE INSTRUCTIONS   Keep the injured part elevated while sitting or lying down. Keep the injury above the level of your heart (the center of the chest). This will decrease swelling and pain.  Apply ice to the injury for 15-20 minutes, 03-04 times per day while awake, for 2 days. Put the ice in a plastic bag and place a towel between the bag of ice and your cast or splint.  Move your fingers to avoid stiffness and minimize swelling.  If you have a plaster or fiberglass cast:  Do not try to scratch the skin under the cast using sharp or pointed objects.  Check the skin around the cast every day. You may put lotion on any red or sore areas.  Keep your cast dry and clean.  If you have a plaster  splint:  Wear the splint as directed.  You may loosen the elastic around the splint if your fingers become numb, tingle, or turn cold or blue.  Do not put pressure on any part of your cast or splint. It may break. Rest your cast only on a pillow for the first 24 hours until it is fully hardened.  Your cast or splint can be protected during bathing with a plastic bag. Do not lower the cast or splint into the water.  Only take over-the-counter or prescription medicines for pain, discomfort, or fever as directed by your caregiver.  Follow all  instructions for follow-up with your caregiver. This includes any orthopedic referrals, physical therapy, and rehabilitation. Any delay in obtaining necessary care could result in a delay or failure of the bones to heal or permanent elbow stiffness.  Do not overdo exercises. This could further damage your injury. SEEK IMMEDIATE MEDICAL CARE IF:   Your cast or splint gets damaged or breaks.  You have more severe pain or swelling than you did before getting the cast.  You have severe pain when stretching your fingers.  There is a bad smell, new stains, and/or pus-like (purulent) drainage coming from under the cast.  Your fingers or hand turn pale or blue, become cold, or you lose feeling. Document Released: 02/21/2006 Document Revised: 09/16/2013 Document Reviewed: 03/31/2009 [facility] Patient Information 2015 [place], [place]. This information is not intended to replace advice given to you by your health care provider. Make sure you discuss any questions you have with your health care provider. Sling Use After Injury or Surgery You have been put in a sling today because of an injury or following surgery. If you have a tendon or bone injury it may take up to 6 weeks to heal. Use the sling as directed until your caregiver says it is no longer needed. The sling protects and keeps you from using the injured part. Hanging your arm in a sling will give rest and support to the injured part. This also helps with comfort and healing. Slings are used for injuries made worse or more painful by movement. Examples include:  Broken arms.  Broken collarbones.  Shoulder injuries.  Following surgery. The sling should fit comfortably, with your elbow at one end of the sling and your hand at the other end. Your elbow is bent 90 degrees lying across your waist and rests in the sling with your thumb pointing up. Make sure that the hand of the injured arm does not droop down. That could stretch some nerves in the  wrist. Your hand should be slightly higher than your elbow. You may also pad the sling behind your neck with some cloth or foam rubber.  A swathe may also be used if it is necessary to keep you from lifting your injured arm. A swathe is a wrap or ace bandage that goes around your chest over your injured arm.  To take the weight off your neck, some slings have a strap that goes around your neck and down your back. One strap is connected to the closed elbow side of the sling with the other end of the strap attached to the wrist side. With a sling like this, your injured shoulder, arm, wrist, or hand is in the sling, the weight is more on your shoulder and back. This is different from the illustration where the sling is supported only by the neck.  In an emergency, a sling can be as simple as a  belt or towel tied around your neck to hold your forearm.  HOME CARE INSTRUCTIONS   Do not use your shoulder until instructed to by your caregiver.  If you have been prescribed physical therapy, keep appointments as directed.  For the first couple days following your injury and during times when you are sore, you may use ice on the injured area for 15-20 minutes 03-04 times per day while awake. Put the ice in a plastic bag and place a towel between the bag of ice and your skin. This will help keep the swelling down.  If there is numbness in the fifth finger and ring fingers you may need to pad the elbow to relieve pressure on the ulnar nerve (the crazy bone).  Keep your arm on your chest when lying down.  If a plaster splint was applied, wear the splint until you are seen for a follow-up examination. Rest it on nothing harder than a pillow the first 24 hours. Do not get it wet. You may take it off to take a shower or bath unless instructed otherwise by your caregiver.  You may have been given an elastic bandage to use with the plaster splint or alone. The splint is too tight if you have numbness, tingling, or  if your hand becomes cold and blue. Adjust or reapply the bandage to make it comfortable.  Only take over-the-counter or prescription medicines for pain, discomfort, or fever as directed by your caregiver.  If range of motion exercises are permitted by your caregiver, do not go over the limits suggested. If you have increased pain from doing gentle exercises, stop the exercises until you see your caregiver again.  The length of time needed for healing depends on what your injury or surgery was. SEEK IMMEDIATE MEDICAL CARE IF:   You have an increase in bruising, swelling or pain in the area of your injury or surgery.  You notice a blue color of or coldness in your fingers.  Pain relief is not obtained with medications or any of your problems are getting worse. Document Released: 12/15/2003 Document Revised: 04/18/2012 Document Reviewed: 03/17/2007 [facility] Patient Information 2015 [place], [place]. This information is not intended to replace advice given to you by your health care provider. Make sure you discuss any questions you have with your health care provider. Shoulder Range of Motion Exercises The shoulder is the most flexible joint in the human body. Because of this it is also the most unstable joint in the body. All ages can develop shoulder problems. Early treatment of problems is necessary for a good outcome. People react to shoulder pain by decreasing the movement of the joint. After a brief period of time, the shoulder can become "frozen". This is an almost complete loss of the ability to move the damaged shoulder. Following injuries your caregivers can give you instructions on exercises to keep your range of motion (ability to move your shoulder freely), or regain it if it has been lost.  EXERCISES EXERCISES TO MAINTAIN THE MOBILITY OF YOUR SHOULDER: Codman's Exercise or Pendulum Exercise  This exercise may be performed in a prone (face-down) lying position or standing while leaning  on a chair with the opposite arm. Its purpose is to relax the muscles in your shoulder and slowly but surely increase the range of motion and to relieve pain.  Lie on your stomach close to the side edge of the bed. Let your weak arm hang over the edge of the bed. Relax your  shoulder, arm and hand. Let your shoulder blade relax and drop down.  Slowly and gently swing your arm forward and back. Do not use your neck muscles; relax them. It might be easier to have someone else gently start swinging your arm.  As pain decreases, increase your swing. To start, arm swing should begin at 15 degree angles. In time and as pain lessens, move to 30-45 degree angles. Start with swinging for about 15 seconds, and work towards swinging for 3 to 5 minutes.  This exercise may also be performed in a standing/bent over position.  Stand and hold onto a sturdy chair with your good arm. Bend forward at the waist and bend your knees slightly to help protect your back. Relax your weak arm, let it hang limp. Relax your shoulder blade and let it drop.  Keep your shoulder relaxed and use body motion to swing your arm in small circles.  Stand up tall and relax.  Repeat motion and change direction of circles.  Start with swinging for about 30 seconds, and work towards swinging for 3 to 5 minutes. STRETCHING EXERCISES:  Lift your arm out in front of you with the elbow bent at 90 degrees. Using your other arm gently pull the elbow forward and across your body.  Bend one arm behind you with the palm facing outward. Using the other arm, hold a towel or rope and reach this arm up above your head, then bend it at the elbow to move your wrist to behind your neck. Grab the free end of the towel with the hand behind your back. Gently pull the towel up with the hand behind your neck, gradually increasing the pull on the hand behind the small of your back. Then, gradually pull down with the hand behind the small of your back. This  will pull the hand and arm behind your neck further. Both shoulders will have an increased range of motion with repetition of this exercise. STRENGTHENING EXERCISES:  Standing with your arm at your side and straight out from your shoulder with the elbow bent at 90 degrees, hold onto a small weight and slowly raise your hand so it points straight up in the air. Repeat this five times to begin with, and gradually increase to ten times. Do this four times per day. As you grow stronger you can gradually increase the weight.  Repeat the above exercise, only this time using an elastic band. Start with your hand up in the air and pull down until your hand is by your side. As you grow stronger, gradually increase the amount you pull by increasing the number or size of the elastic bands. Use the same amount of repetitions.  Standing with your hand at your side and holding onto a weight, gradually lift the hand in front of you until it is over your head. Do the same also with the hand remaining at your side and lift the hand away from your body until it is again over your head. Repeat this five times to begin with, and gradually increase to ten times. Do this four times per day. As you grow stronger you can gradually increase the weight. Document Released: 01/29/2003 Document Revised: 05/07/2013 Document Reviewed: 05/02/2005 [facility] Patient Information 2015 [place], [place]. This information is not intended to replace advice given to you by your health care provider. Make sure you discuss any questions you have with your health care provider.

## 2014-12-08 NOTE — ED Notes (Signed)
Pt states she had an injury/fracture left arm. States that when ortho dr took fiber glass splint off pain continued to get worse. States she has muscle spasms.

## 2015-01-07 ENCOUNTER — Ambulatory Visit: Payer: BLUE CROSS/BLUE SHIELD | Admitting: Certified Nurse Midwife

## 2015-01-08 ENCOUNTER — Ambulatory Visit (INDEPENDENT_AMBULATORY_CARE_PROVIDER_SITE_OTHER): Payer: BLUE CROSS/BLUE SHIELD | Admitting: Certified Nurse Midwife

## 2015-01-08 ENCOUNTER — Encounter: Payer: Self-pay | Admitting: Certified Nurse Midwife

## 2015-01-08 VITALS — BP 114/68 | HR 68 | Resp 16 | Ht 64.0 in | Wt 136.0 lb

## 2015-01-08 DIAGNOSIS — N898 Other specified noninflammatory disorders of vagina: Secondary | ICD-10-CM

## 2015-01-08 DIAGNOSIS — L900 Lichen sclerosus et atrophicus: Secondary | ICD-10-CM

## 2015-01-08 MED ORDER — CLOBETASOL PROPIONATE 0.05 % EX OINT: 1.0000 "application " | TOPICAL_OINTMENT | Freq: Two times a day (BID) | CUTANEOUS | Status: DC

## 2015-01-08 NOTE — Progress Notes (Signed)
49 y.o.Legally Separated white g2p3 here with complaint of vaginal symptoms of itching, burning, and increase discharge. Not sure if LS has flared, slight external itching. Describes discharge as grey/watery with odor. Recent trip to [place] and used new soaps and snorkeling and in pools there.Onset of symptoms 14 days ago. No STD concerns. Urinary symptoms none . Contraception none. Recently married!. Will be returning to [place]( her home now) in 4 days. No other concerns today.  O:Healthy female WDWN Affect: normal, orientation x 3  Exam: Abdomen:soft, non tender Lymph node: no enlargement or tenderness Pelvic exam: External genital: normal female with slight area of LS at clitoral hood only, no lesions or scaling noted BUS: negative Vagina: blood present(menses) with watery discharge noted.  Affirm taken Cervix: normal, non tender, no CMT Uterus: normal, non tender Adnexa:normal, non tender, no masses or fullness noted  A:Normal pelvic exam Vaginal discharge History of LS with slight flare noted   P:Discussed findings of LS again and need to start Clobetasol bid x 7 days, if symptoms have resolved can stop. . Discussed Aveeno or baking soda sitz bath for comfort. Avoid moist clothes or pads for extended period of time. If working out in gym clothes or swim suits for long periods of time change underwear or bottoms of swimsuit if possible. Olive Oil/Coconut Oil use for skin protection prior to activity can be used to external skin. Discussed this may help with her frequent water exposure. Rx Clobetasol see order Will await affirm results and treat as indicated.   Rv prn

## 2015-01-09 ENCOUNTER — Other Ambulatory Visit: Payer: Self-pay | Admitting: Certified Nurse Midwife

## 2015-01-09 DIAGNOSIS — B9689 Other specified bacterial agents as the cause of diseases classified elsewhere: Secondary | ICD-10-CM

## 2015-01-09 DIAGNOSIS — N760 Acute vaginitis: Secondary | ICD-10-CM

## 2015-01-09 LAB — WET PREP BY MOLECULAR PROBE
CANDIDA SPECIES: NEGATIVE
Gardnerella vaginalis: POSITIVE — AB
TRICHOMONAS VAG: NEGATIVE

## 2015-01-09 MED ORDER — HYLAFEM VA SUPP: 1.0000 | Freq: Every day | VAGINAL | Status: DC

## 2015-01-09 NOTE — Progress Notes (Signed)
Reviewed personally.  M. Suzanne Burrell Hodapp, MD.  

## 2015-01-22 ENCOUNTER — Telehealth: Payer: Self-pay | Admitting: Certified Nurse Midwife

## 2015-01-22 NOTE — Telephone Encounter (Signed)
Spoke with patient. Seen on 01/08/2015 with Leota Sauers CNM. Patient states that she used all 6 of the Hylafem suppositories and is still having vaginal itching and discharge. Completed last dose of Hylafem two days ago. Has also been using Clobetasol externally with relief. "I feel I am better up top from the Clobetasol but that nothing else is better." States she has also been experiencing "sharp pelvic pain" on her left side that is intermittent. Denies any current pain or discomfort. Advised will need to be seen in office for further evaluation. Patient is agreeable. Offered appointment today, but patient declines. Appointment scheduled for tomorrow at 12:45 pm with Leota Sauers CNM. Patient is agreeable to date and time.  Routing to provider for final review. Patient agreeable to disposition. Will close encounter.

## 2015-01-22 NOTE — Telephone Encounter (Signed)
Patient says she is still having symptoms of itching and discharge.

## 2015-01-23 ENCOUNTER — Ambulatory Visit (INDEPENDENT_AMBULATORY_CARE_PROVIDER_SITE_OTHER): Payer: BLUE CROSS/BLUE SHIELD | Admitting: Certified Nurse Midwife

## 2015-01-23 ENCOUNTER — Encounter: Payer: Self-pay | Admitting: Certified Nurse Midwife

## 2015-01-23 VITALS — BP 110/68 | HR 68 | Resp 16 | Ht 64.0 in | Wt 135.0 lb

## 2015-01-23 DIAGNOSIS — N9489 Other specified conditions associated with female genital organs and menstrual cycle: Secondary | ICD-10-CM

## 2015-01-23 DIAGNOSIS — N898 Other specified noninflammatory disorders of vagina: Secondary | ICD-10-CM

## 2015-01-23 NOTE — Progress Notes (Signed)
Reviewed personally.  M. Suzanne Devlyn Parish, MD.  
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
## 2015-01-24 LAB — WET PREP BY MOLECULAR PROBE
Candida species: POSITIVE — AB
GARDNERELLA VAGINALIS: NEGATIVE
Trichomonas vaginosis: NEGATIVE

## 2015-01-26 ENCOUNTER — Telehealth: Payer: Self-pay | Admitting: Certified Nurse Midwife

## 2015-01-26 MED ORDER — FLUCONAZOLE 150 MG PO TABS: 150.0000 mg | ORAL_TABLET | Freq: Once | ORAL | Status: DC

## 2015-01-26 NOTE — Telephone Encounter (Signed)
Patient called in regards to results from 01/23/15 with DL patient says she hasn't gotten any relief with her symptoms. Best # to reach is her mobile # [phone] okay to leave detailed message.

## 2015-01-26 NOTE — Telephone Encounter (Signed)
Yes OK to treat for yeast with Diflucan #2 tabs

## 2015-01-26 NOTE — Telephone Encounter (Signed)
Spoke with patient. Advised of results of affirm being positive for yeast. Rx for Diflucan 150 mg #2 0Rf sent to pharmacy on file. Patient is agreeable.  Routing to provider for final review. Patient agreeable to disposition. Will close encounter.

## 2015-01-26 NOTE — Telephone Encounter (Signed)
Routing to Ria Comment, FNP for review as Leota Sauers CNM is out of the office today. Patient had affirm testing done on 01/23/2015 for vaginitis. Affirm returned positive for yeast and negative for BV and Trichomonas. Ria Comment, FNP okay to send in Diflucan 150 mg #2 0RF for patient to pharmacy of choice?

## 2015-02-03 ENCOUNTER — Ambulatory Visit
Admission: RE | Admit: 2015-02-03 | Discharge: 2015-02-03 | Disposition: A | Discharge status: Home / Self Care | Payer: BLUE CROSS/BLUE SHIELD | Source: Ambulatory Visit | Attending: Allergy and Immunology | Admitting: Allergy and Immunology

## 2015-02-03 ENCOUNTER — Other Ambulatory Visit: Payer: Self-pay | Admitting: Allergy and Immunology

## 2015-02-03 DIAGNOSIS — R05 Cough: Secondary | ICD-10-CM

## 2015-02-03 DIAGNOSIS — R059 Cough, unspecified: Secondary | ICD-10-CM

## 2015-03-13 ENCOUNTER — Telehealth: Payer: Self-pay | Admitting: Certified Nurse Midwife

## 2015-03-13 NOTE — Telephone Encounter (Signed)
Patient states she is having a flare up of lichen sclerosus and a "raging yeast infection".  Best contact [phone]

## 2015-03-13 NOTE — Telephone Encounter (Signed)
Spoke with patient. Patient states that she feels she may be having a flare up of her lichen sclerosus and a yeast infection. Is experiencing vaginal discharge and itching with external burning. "I have a white patch externally that I feel is lichen sclerosus." Has been using her clobetasol ointment with little relief. "I feel the discharge and everything else is making it worse."  States that her dermatologist recommends that she have a biopsy to confirm diagnosis of lichen sclerosus. Advised can speak with MD about this at her appointment. Patient is agreeable. Advised she will need to be seen in office for further evaluation. Patient is agreeable. Patient is not available until Wednesday 11/2. Appointment scheduled for 11/2 at 1 pm with Dr.Jerston. Patient is agreeable to date and time. Advised if she becomes increasingly uncomfortable over the weekend may use Aveeno sitz bath. Patient is allergic to Monistat. Advised may be seen at a local Urgent Care if her symptoms increase over the weekend. Patient is agreeable.  Routing to provider for final review. Patient agreeable to disposition. Will close encounter.

## 2015-03-17 ENCOUNTER — Encounter: Payer: Self-pay | Admitting: Obstetrics and Gynecology

## 2015-03-17 ENCOUNTER — Ambulatory Visit (INDEPENDENT_AMBULATORY_CARE_PROVIDER_SITE_OTHER): Payer: BLUE CROSS/BLUE SHIELD | Admitting: Obstetrics and Gynecology

## 2015-03-17 VITALS — BP 100/60 | HR 64 | Resp 14 | Wt 135.0 lb

## 2015-03-17 DIAGNOSIS — K648 Other hemorrhoids: Secondary | ICD-10-CM

## 2015-03-17 DIAGNOSIS — L293 Anogenital pruritus, unspecified: Secondary | ICD-10-CM

## 2015-03-17 DIAGNOSIS — K644 Residual hemorrhoidal skin tags: Secondary | ICD-10-CM

## 2015-03-17 MED ORDER — HYDROCORTISONE 2.5 % RE CREA: TOPICAL_CREAM | RECTAL | Status: DC

## 2015-03-17 NOTE — Patient Instructions (Signed)

## 2015-03-17 NOTE — Progress Notes (Addendum)
Patient ID: Erika Mccoy, female   DOB: [birth year], 49 y.o.   MRN: [id number] GYNECOLOGY  VISIT   HPI: 49 y.o.   Legally Separated  Caucasian  female   G2P2 with No LMP recorded. Patient has had an ablation.   here c/o vaginal irritation and itching. She also has a mole in her vaginal area that she is concerned about. She has a h/o lichen sclerosis (not biopsy proven, dermatologist thought it should be biopsied). She uses steroid ointment intermittently when she flares. She can tear with intercourse, tissue is very thin. When she flares she gets very "itchy and prickly". She also c/o frequent yeast infections. She c/o generalized vulvar pruritus x 3 weeks. She c/o an increased thick white vaginal discharge. She is feeling like the adhesions from the lichen sclerosis are getting worse around her clitoris.   She has IBS, usually she can control it with her diet. When she travels she gets constipated. She recently traveled to [place], fibromyalgia flared this morning. This morning she strained with BM and something is out of her anus, hurts terribly.   GYNECOLOGIC HISTORY: No LMP recorded. Patient has had an ablation. Contraception:ablation Menopausal hormone therapy: N/A        OB History    Gravida Para Term Preterm AB TAB SAB Ectopic Multiple Living   Patient Active Problem List   Diagnosis Date Noted  . Fothergill's neuralgia 01/15/2014  . Rotator cuff syndrome 07/15/2013  . ANA positive 03/12/2013  . PCB (post coital bleeding) 01/18/2013  . Lichen sclerosus 12/03/2012  . Irritable bowel syndrome 12/03/2012  . Interstitial cystitis 12/03/2012  . Hypertriglyceridemia 10/25/2011  . Allergic rhinitis, seasonal 09/28/2011  . Adhesions of vagina 08/22/2011  . 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 Acne 12/21/2010  . Bipolar 1 disorder (HCC) 12/09/2010  . 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 Fibrositis 12/09/2010  . Anxiety, generalized 12/09/2010  . Neurosis,  posttraumatic 12/09/2010    Past Medical History  Diagnosis Date  . Interstitial cystitis   . Fibromyalgia   . Anxiety   . Lichen sclerosus of female genitalia   . Manic depression (HCC)   . HSV-2 infection     rare occurence  . MS (multiple sclerosis) (HCC)   . IBS (irritable bowel syndrome)   . History of self mutilation   . Arm sprain 5/11    right   . Lichen sclerosus     Vulva  . STD (sexually transmitted disease) 1989    Hx of HSV II, genital warts  . Genital warts   . Abnormal Pap smear of cervix 1997    --hx of conization of cervix by Dr. Roberto Scales  . Broken arm     left arm by elbow    Past Surgical History  Procedure Laterality Date  . Endometrial ablation  1997/1998  . Breast enhancement surgery  1990  . Cervix lesion destruction  1997    Dr. Roberto Scales    Current Outpatient Prescriptions  Medication Sig Dispense Refill  . clobetasol ointment (TEMOVATE) 0.05 % Apply 1 application topically 2 (two) times daily. 60 g 0  . clonazePAM (KLONOPIN) 2 MG tablet Take 1 tablet (2 mg total) by mouth daily. 30 tablet   . DiphenhydrAMINE HCl (BENADRYL ALLERGY PO) Take by mouth.    . fluticasone (FLONASE) 50 MCG/ACT nasal spray Place 1 spray into both nostrils daily.    Marland Kitchen lamoTRIgine (  LAMICTAL) 200 MG tablet Take 200 mg by mouth daily.    . montelukast (SINGULAIR) 10 MG tablet Take 1 tablet (10 mg total) by mouth every morning.    Marland Kitchen omeprazole (PRILOSEC) 20 MG capsule Take 20 mg by mouth.    Marland Kitchen QVAR 40 MCG/ACT inhaler   5  . topiramate (TOPAMAX) 50 MG tablet Take 50 mg by mouth. Take 3 tablets daily  1  . traZODone (DESYREL) 100 MG tablet Take 2 tablets (200 mg total) by mouth at bedtime.    Pauline Aus HFA 45 MCG/ACT inhaler INHALE 2 PUFFS Q 4 TO 6 HOURS AS NEEDED FOR COUGH OR WHEEZE  1   No current facility-administered medications for this visit.     ALLERGIES: Cefzil; Ciprofloxacin; Lidocaine; Monistat; Neosporin; Quinolones; Septra; Valtrex; and Adhesive  History  reviewed. No pertinent family history.  Social History   Social History  . Marital Status: Legally Separated    Spouse Name: N/A  . Number of Children: N/A  . Years of Education: N/A   Occupational History  . Not on file.   Social History Main Topics  . Smoking status: Never Smoker   . Smokeless tobacco: Never Used  . Alcohol Use: 0.6 oz/week    1 Standard drinks or equivalent per week  . Drug Use: No  . Sexual Activity:    Partners: Male    Birth Control/ Protection: Surgical     Comment: Ablation   Other Topics Concern  . Not on file   Social History Narrative    Review of Systems  Genitourinary:       Vaginal irritation vaginal itching   Skin:       Mole in vaginal area  All other systems reviewed and are negative.   PHYSICAL EXAMINATION:    BP 100/60 mmHg  Pulse 64  Resp 14  Wt 135 lb (61.236 kg)    General appearance: alert, cooperative and appears stated age  Pelvic: External genitalia:  Mild agglutination of labia minora to labia majora, no significant erythema or whitening. Some whitening on the lower vulva bilaterally and in the perianal area. No thickening, no fissures.               Urethra:  normal appearing urethra with no masses, tenderness or lesions              Bartholins and Skenes: normal                 Vagina: normal appearing vagina with normal color and discharge, no lesions              Cervix: no lesions              Anus: she has a small external hemorrhoid with a laceration in it  Chaperone was present for exam.  ASSESSMENT Genital pruritus, negative vaginal slides H/O lichen sclerosis, very mild skin changes on exam Hemorrhoid    PLAN Wet prep probe She will use her clobetasol externally BID for the next week Discussed vulvar skin care Anusol cream to anus TID  Miralax for constipation Hydrate well   An After Visit Summary was printed and given to the patient.  Addendum Wet prep: no clue, no trich, +WBC KOH: no  yeast PH: 4

## 2015-03-18 ENCOUNTER — Ambulatory Visit: Payer: BLUE CROSS/BLUE SHIELD | Admitting: Obstetrics and Gynecology

## 2015-03-18 ENCOUNTER — Telehealth: Payer: Self-pay

## 2015-03-18 LAB — WET PREP BY MOLECULAR PROBE
CANDIDA SPECIES: NEGATIVE
GARDNERELLA VAGINALIS: POSITIVE — AB
TRICHOMONAS VAG: NEGATIVE

## 2015-03-18 MED ORDER — METRONIDAZOLE 500 MG PO TABS: 500.0000 mg | ORAL_TABLET | Freq: Two times a day (BID) | ORAL | Status: DC

## 2015-03-18 NOTE — Telephone Encounter (Signed)
Spoke with patient. Advised of results as seen below from Dr.Jertson. Patient is agreeable and verbalizes understanding. Patient would like to start Flagyl at this time. Rx for Flagyl 500 mg BID x 7 days #14 0RF sent to pharmacy on file. ETOH precautions given.  Routing to provider for final review. Patient agreeable to disposition. Will close encounter.

## 2015-03-18 NOTE — Addendum Note (Signed)
Addended by: Jannet Askew on: 03/18/2015 10:33 AM   Modules accepted: Orders

## 2015-03-18 NOTE — Telephone Encounter (Signed)
-----   Message from Romualdo Bolk, MD sent at 03/18/2015 10:15 AM EDT ----- Please inform the patient that her vaginitis probe was + for BV and treat with flagyl (either oral or vaginal, her choice), no ETOH while on Flagyl.  Oral: Flagyl 500 mg BID x 7 days, or Vaginal: Metrogel, 1 applicator per vagina q day x 5 days.

## 2015-04-01 ENCOUNTER — Encounter (HOSPITAL_COMMUNITY): Payer: Self-pay | Admitting: Emergency Medicine

## 2015-04-01 ENCOUNTER — Emergency Department (HOSPITAL_COMMUNITY)
Admission: EM | Admit: 2015-04-01 | Discharge: 2015-04-01 | Disposition: A | Discharge status: Home / Self Care | Payer: BLUE CROSS/BLUE SHIELD | Attending: Physician Assistant | Admitting: Physician Assistant

## 2015-04-01 DIAGNOSIS — Z8719 Personal history of other diseases of the digestive system: Secondary | ICD-10-CM | POA: Diagnosis not present

## 2015-04-01 DIAGNOSIS — Y9289 Other specified places as the place of occurrence of the external cause: Secondary | ICD-10-CM | POA: Insufficient documentation

## 2015-04-01 DIAGNOSIS — G35 Multiple sclerosis: Secondary | ICD-10-CM | POA: Diagnosis not present

## 2015-04-01 DIAGNOSIS — W269XXA Contact with unspecified sharp object(s), initial encounter: Secondary | ICD-10-CM | POA: Diagnosis not present

## 2015-04-01 DIAGNOSIS — Z872 Personal history of diseases of the skin and subcutaneous tissue: Secondary | ICD-10-CM | POA: Diagnosis not present

## 2015-04-01 DIAGNOSIS — F319 Bipolar disorder, unspecified: Secondary | ICD-10-CM | POA: Insufficient documentation

## 2015-04-01 DIAGNOSIS — Y998 Other external cause status: Secondary | ICD-10-CM | POA: Insufficient documentation

## 2015-04-01 DIAGNOSIS — Z79899 Other long term (current) drug therapy: Secondary | ICD-10-CM | POA: Diagnosis not present

## 2015-04-01 DIAGNOSIS — Z8742 Personal history of other diseases of the female genital tract: Secondary | ICD-10-CM | POA: Diagnosis not present

## 2015-04-01 DIAGNOSIS — Z7951 Long term (current) use of inhaled steroids: Secondary | ICD-10-CM | POA: Insufficient documentation

## 2015-04-01 DIAGNOSIS — S81812A Laceration without foreign body, left lower leg, initial encounter: Secondary | ICD-10-CM | POA: Diagnosis present

## 2015-04-01 DIAGNOSIS — IMO0002 Reserved for concepts with insufficient information to code with codable children: Secondary | ICD-10-CM

## 2015-04-01 DIAGNOSIS — F419 Anxiety disorder, unspecified: Secondary | ICD-10-CM | POA: Insufficient documentation

## 2015-04-01 DIAGNOSIS — Z8781 Personal history of (healed) traumatic fracture: Secondary | ICD-10-CM | POA: Diagnosis not present

## 2015-04-01 DIAGNOSIS — Y9389 Activity, other specified: Secondary | ICD-10-CM | POA: Diagnosis not present

## 2015-04-01 DIAGNOSIS — Z8619 Personal history of other infectious and parasitic diseases: Secondary | ICD-10-CM | POA: Insufficient documentation

## 2015-04-01 DIAGNOSIS — S91012A Laceration without foreign body, left ankle, initial encounter: Secondary | ICD-10-CM | POA: Insufficient documentation

## 2015-04-01 NOTE — Discharge Instructions (Signed)
Please follow-up with your psychiatrist in the morning. Please return if you have any feelings of wanting to hurt yourself or others.
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
## 2015-04-01 NOTE — ED Provider Notes (Signed)
[name]: [id number]     Arrival date & time 04/01/15  1602 History   First MD Initiated Contact with Patient 04/01/15 1723     Chief Complaint  Patient presents with  . Extremity Laceration  . Medical Clearance     (Consider location/radiation/quality/duration/timing/severity/associated sxs/prior Treatment) HPI   Patient is a 49 year old female with history of fibromyalgia, multiple sclerosis, depression, anxiety, presenting today with cutting her left leg. Patient has cut his leg in the spot over 14 times in the past. Patient states she does it to release her tension. She denies wanting to hurt herself otherwise. She denies SI. Patient's very rational on exam. She states that she sees a therapist and has been seen him every week. She says that she can call him in the morning. She does not want to see a therapist here. Patient's with her husband  Past Medical History  Diagnosis Date  . Interstitial cystitis   . Fibromyalgia   . Anxiety   . Lichen sclerosus of female genitalia   . Manic depression (HCC)   . HSV-2 infection     rare occurence  . MS (multiple sclerosis) (HCC)   . IBS (irritable bowel syndrome)   . History of self mutilation   . Arm sprain 5/11    right   . Lichen sclerosus     Vulva  . STD (sexually transmitted disease) 1989    Hx of HSV II, genital warts  . Genital warts   . Abnormal Pap smear of cervix 1997    --hx of conization of cervix by Dr. Roberto Scales  . Broken arm     left arm by elbow   Past Surgical History  Procedure Laterality Date  . Endometrial ablation  1997/1998  . Breast enhancement surgery  1990  . Cervix lesion destruction  1997    Dr. Roberto Scales   History reviewed. No pertinent family history. Social History  Substance Use Topics  . Smoking status: Never Smoker   . Smokeless tobacco: Never Used  . Alcohol Use: 0.6 oz/week    1 Standard drinks or equivalent per week   OB History    Gravida Para Term Preterm AB TAB SAB Ectopic Multiple Living    2 2       1 3      Review of Systems  Constitutional: Negative for activity change.  Respiratory: Negative for shortness of breath.   Cardiovascular: Negative for chest pain.  Gastrointestinal: Negative for abdominal pain.      Allergies  Cefzil; Ciprofloxacin; Lidocaine; Monistat; Neosporin; Quinolones; Septra; Valtrex; and Adhesive  Home Medications   Prior to Admission medications   Medication Sig Start Date End Date Taking? Authorizing Provider  clobetasol ointment (TEMOVATE) 0.05 % Apply 1 application topically 2 (two) times daily. Patient taking differently: Apply 1 application topically daily as needed (flare-up).  01/08/15  Yes Verner Chol, CNM  clonazePAM (KLONOPIN) 2 MG tablet Take 1 tablet (2 mg total) by mouth daily. 01/20/14  Yes Charm Rings, NP  diphenhydrAMINE (BENADRYL) 25 MG tablet Take 50 mg by mouth every 6 (six) hours as needed for allergies.   Yes Historical Provider, MD  fluticasone (FLONASE) 50 MCG/ACT nasal spray Place 1 spray into both nostrils daily.   Yes Historical Provider, MD  lamoTRIgine (LAMICTAL) 200 MG tablet Take 200 mg by mouth daily.   Yes Historical Provider, MD  montelukast (SINGULAIR) 10 MG tablet Take 1 tablet (10 mg total) by mouth every morning. 01/20/14  Yes Herminio Heads  Lord, NP  omeprazole (PRILOSEC) 20 MG capsule Take 20 mg by mouth daily.  08/12/14  Yes Historical Provider, MD  polyethylene glycol (MIRALAX / GLYCOLAX) packet Take 17 g by mouth daily as needed for mild constipation.   Yes Historical Provider, MD  QVAR 40 MCG/ACT inhaler Inhale 2 puffs into the lungs 2 (two) times daily.  03/14/15  Yes Historical Provider, MD  topiramate (TOPAMAX) 50 MG tablet Take 150 mg by mouth daily. Take 3 tablets daily 07/01/14  Yes Historical Provider, MD  traMADol (ULTRAM) 50 MG tablet Take 50 mg by mouth every 6 (six) hours as needed for moderate pain or severe pain.   Yes Historical Provider, MD  traZODone (DESYREL) 100 MG tablet Take 2 tablets  (200 mg total) by mouth at bedtime. 01/20/14  Yes Charm Rings, NP  XOPENEX HFA 45 MCG/ACT inhaler INHALE 2 PUFFS Q 4 TO 6 HOURS AS NEEDED FOR COUGH OR WHEEZE 02/03/15  Yes Historical Provider, MD  hydrocortisone (ANUSOL-HC) 2.5 % rectal cream Apply a small amount topically TID prn x 1-2 weeks Patient not taking: Reported on 04/01/2015 03/17/15   Romualdo Bolk, MD  metroNIDAZOLE (FLAGYL) 500 MG tablet Take 1 tablet (500 mg total) by mouth 2 (two) times daily. Patient not taking: Reported on 04/01/2015 03/18/15   Romualdo Bolk, MD   BP 96/77 mmHg  Pulse 77  Temp(Src) 98.3 F (36.8 C) (Oral)  Resp 20  SpO2 95% Physical Exam  Constitutional: She is oriented to person, place, and time. She appears well-developed and well-nourished.  HENT:  Head: Normocephalic and atraumatic.  Eyes: Right eye exhibits no discharge.  Cardiovascular: Normal rate.   No murmur heard. Pulmonary/Chest: Effort normal.  Musculoskeletal:  Three-inch laceration to the left medial malleolus. Superficial. Old scars surrounding it.  Neurological: She is oriented to person, place, and time.  Skin: Skin is warm and dry. She is not diaphoretic.  Psychiatric: She has a normal mood and affect.  Nursing note and vitals reviewed.   ED Course  Procedures (including critical care time) Labs Review Labs Reviewed - No data to display  Imaging Review No results found. I have personally reviewed and evaluated these images and lab results as part of my medical decision-making.   EKG Interpretation None      MDM   Final diagnoses:  None    Patient is a 49 year old female presenting after having cut her ankle for stress relief. Patient denies any suicidality. She says she reports she comes here usually gets repaired and then is able to go home. She does not want to be seen psychiatrist here. She denies any pain. Denis any suicidal ideation or homicidal ideation.  She recently had a tetanus. She contracts for  safety as does her husband. Given that she denies any psychiatric admission criteria at this time will have her follow-up with her psychiatrist in the morning.  Laceration superficial does not require any stitches. We'll put Steri-Strips and wrap with a Ace bandage for comfort.  Danni Leabo Randall An, MD 04/01/15 [phone]

## 2015-04-01 NOTE — ED Notes (Signed)
AVS explained in detail. Knows to follow up with PCP and therapist. Wound dressing clean/dry/intact. States BP is always baseline low. Is heading to eat dinner after this and encouraged to drink increased amounts of fluids. Ambulatory. A&Ox4. No change in mental status. Neurologically intact. Calm and cooperative. In NAD. No anxiety noted.

## 2015-04-01 NOTE — ED Notes (Signed)
Per ER MD patient needs to be psychiatrically cleared.

## 2015-04-01 NOTE — ED Notes (Signed)
Pt states she had a tetanus shot the last time she cut herself a few months ago. Cleaned and dressed superficial leg wound.

## 2015-04-20 ENCOUNTER — Ambulatory Visit (INDEPENDENT_AMBULATORY_CARE_PROVIDER_SITE_OTHER): Payer: BLUE CROSS/BLUE SHIELD | Admitting: Nurse Practitioner

## 2015-04-20 ENCOUNTER — Encounter: Payer: Self-pay | Admitting: Nurse Practitioner

## 2015-04-20 VITALS — BP 102/64 | HR 68 | Ht 64.0 in | Wt 134.0 lb

## 2015-04-20 DIAGNOSIS — N760 Acute vaginitis: Secondary | ICD-10-CM

## 2015-04-20 DIAGNOSIS — L900 Lichen sclerosus et atrophicus: Secondary | ICD-10-CM

## 2015-04-20 NOTE — Patient Instructions (Signed)
Will call you with test results.

## 2015-04-20 NOTE — Progress Notes (Signed)
49 y.o. Married female G2P2 here with complaint of vaginal symptoms of itching, burning, and increase discharge. Describes discharge as white.   She is now married again since June.  Her husband weighs 350 pounds and she is concerned that he does not shower well enough, giving her back the BV and or yeast symptoms.  She was seen here a month ago. Onset of symptoms 3 days ago. Denies new personal products or vaginal dryness.  But she does have LSA and has used Clobetasol a few times with some help. No STD concerns. Urinary symptoms none other than her usual IC.  Her general routine  after SA:  is to self cath with injection of Marcaine and Heparin to treat IC.  Then vaginal valium suppository to the vagina and rectum.  Increase po fluids. Then usually cath and still use Marcaine and Heparin for 1-2 days later.  Take Macrobid 100 mg daily. Take probiotic 1 million units daily.  O:  Healthy female WDWN Affect: normal, orientation x 3 Exam: no acute distress Abdomen: soft and non tender Lymph node: no enlargement or tenderness Pelvic exam: External genital: normal female with LSA changes with flare BUS: negative Vagina: thin white discharge noted.  Affirm taken. Cervix: normal, non tender    A: Vaginitis  Recent history of BV and treatment with Flagyl in November  Flare of LSA  History  of IBS, IC  P: Discussed findings of vaginitis and etiology. Discussed Aveeno or baking soda sitz bath for comfort. Avoid moist clothes or pads for extended period of time. If working out in gym clothes or swim suits for long periods of time change underwear or bottoms of swimsuit if possible. Olive Oil/Coconut Oil use for skin protection prior to activity can be used to external skin.  Rx: Follow with Affirm  RV prn

## 2015-04-21 LAB — WET PREP BY MOLECULAR PROBE
CANDIDA SPECIES: NEGATIVE
Gardnerella vaginalis: NEGATIVE
TRICHOMONAS VAG: NEGATIVE

## 2015-04-22 ENCOUNTER — Telehealth: Payer: Self-pay | Admitting: Nurse Practitioner

## 2015-04-22 NOTE — Telephone Encounter (Signed)
Spoke with patient. Advised of results as seen below from Ria Comment, FNP. Patient states she is still experiencing increased vaginal discharge. Denies any additional symptoms. Offered to schedule OV for further evaluation. Patient declines at this time. "Since everything was negative I will wait and see how I do." Advised patient if symptoms persist will need to notify provider. Patient is agreeable.  Notes Recorded by Ria Comment, FNP on 04/21/2015 at 8:33 AM Results via my chart:  Erika Mccoy, The wet prep is negative. Please use the Temovate for the external flare of lichen sclerosis. If symptoms persist please let me know.  Routing to provider for final review. Patient agreeable to disposition. Will close encounter.

## 2015-04-22 NOTE — Progress Notes (Signed)
Encounter reviewed Jill Jertson, MD   

## 2015-04-22 NOTE — Telephone Encounter (Signed)
    Patient calling for lab results 

## 2015-04-29 ENCOUNTER — Telehealth: Payer: Self-pay | Admitting: Obstetrics and Gynecology

## 2015-04-29 NOTE — Telephone Encounter (Signed)
Patient is complaining of bilateral hip pain was told if she was still having problems she needs  to come in. She is also still having vaginal discharge. Best # to reach: [phone]

## 2015-04-29 NOTE — Telephone Encounter (Signed)
Left message to call [facility] at [phone]. 

## 2015-04-30 NOTE — Telephone Encounter (Signed)
Patient returning call.

## 2015-04-30 NOTE — Telephone Encounter (Signed)
Spoke with patient. Patient states that she has been experiencing thick white vaginal discharge since before she was seen in the office on 04/20/2015 with Ria Comment, FNP. "I thought it was yeast or bacteria, but your office called and said the testing was all normal. I know that something is not right. This is not normal." States she went shopping a couple days ago and began to have lower back pain and pain close to her hips bones. Pain returns when she stands for an extended period of time, but goes away when sitting. Denies any fever, chills, or pain with urination. Advised she will need to be seen in office for further evaluation. Patient is agreeable. Offered appointment for today but patient declines. Appointment scheduled for tomorrow 05/01/2015 at 1 pm with Dr.Silva. Agreeable to date and time.  Routing to provider for final review. Patient agreeable to disposition. Will close encounter.

## 2015-05-01 ENCOUNTER — Encounter: Payer: Self-pay | Admitting: Obstetrics and Gynecology

## 2015-05-01 ENCOUNTER — Inpatient Hospital Stay (HOSPITAL_COMMUNITY)
Admission: AD | Admit: 2015-05-01 | Discharge: 2015-05-01 | Disposition: A | Discharge status: Home / Self Care | Payer: BLUE CROSS/BLUE SHIELD | Source: Ambulatory Visit | Attending: Obstetrics and Gynecology | Admitting: Obstetrics and Gynecology

## 2015-05-01 ENCOUNTER — Ambulatory Visit (INDEPENDENT_AMBULATORY_CARE_PROVIDER_SITE_OTHER): Payer: BLUE CROSS/BLUE SHIELD | Admitting: Obstetrics and Gynecology

## 2015-05-01 ENCOUNTER — Inpatient Hospital Stay (HOSPITAL_COMMUNITY): Payer: BLUE CROSS/BLUE SHIELD

## 2015-05-01 ENCOUNTER — Encounter (HOSPITAL_COMMUNITY): Payer: Self-pay

## 2015-05-01 VITALS — BP 100/70 | HR 90 | Temp 97.5°F | Resp 16 | Ht 64.0 in | Wt 134.8 lb

## 2015-05-01 DIAGNOSIS — R102 Pelvic and perineal pain: Secondary | ICD-10-CM | POA: Diagnosis not present

## 2015-05-01 DIAGNOSIS — N760 Acute vaginitis: Secondary | ICD-10-CM | POA: Diagnosis not present

## 2015-05-01 DIAGNOSIS — M797 Fibromyalgia: Secondary | ICD-10-CM | POA: Insufficient documentation

## 2015-05-01 DIAGNOSIS — G35 Multiple sclerosis: Secondary | ICD-10-CM | POA: Diagnosis not present

## 2015-05-01 LAB — POCT URINALYSIS DIPSTICK
BILIRUBIN UA: NEGATIVE
Blood, UA: NEGATIVE
GLUCOSE UA: NEGATIVE
KETONES UA: NEGATIVE
Leukocytes, UA: NEGATIVE
NITRITE UA: NEGATIVE
PH UA: 5
Protein, UA: NEGATIVE
Urobilinogen, UA: NEGATIVE

## 2015-05-01 LAB — COMPREHENSIVE METABOLIC PANEL
ALT: 10 U/L — ABNORMAL LOW (ref 14–54)
AST: 14 U/L — AB (ref 15–41)
Albumin: 4.1 g/dL (ref 3.5–5.0)
Alkaline Phosphatase: 59 U/L (ref 38–126)
Anion gap: 7 (ref 5–15)
BILIRUBIN TOTAL: 0.5 mg/dL (ref 0.3–1.2)
BUN: 16 mg/dL (ref 6–20)
CO2: 24 mmol/L (ref 22–32)
Calcium: 9 mg/dL (ref 8.9–10.3)
Chloride: 105 mmol/L (ref 101–111)
Creatinine, Ser: 0.9 mg/dL (ref 0.44–1.00)
GFR calc Af Amer: >60 mL/min (ref >60)
Glucose, Bld: 101 mg/dL — ABNORMAL HIGH (ref 65–99)
POTASSIUM: 4.4 mmol/L (ref 3.5–5.1)
Sodium: 136 mmol/L (ref 135–145)
TOTAL PROTEIN: 7 g/dL (ref 6.5–8.1)

## 2015-05-01 LAB — CBC WITH DIFFERENTIAL/PLATELET
BASOS ABS: 0 10*3/uL (ref 0.0–0.1)
Basophils Relative: 1 %
Eosinophils Absolute: 0.1 10*3/uL (ref 0.0–0.7)
Eosinophils Relative: 2 %
HEMATOCRIT: 38.8 % (ref 36.0–46.0)
Hemoglobin: 13.1 g/dL (ref 12.0–15.0)
LYMPHS ABS: 2.1 10*3/uL (ref 0.7–4.0)
LYMPHS PCT: 28 %
MCH: 30 pg (ref 26.0–34.0)
MCHC: 33.8 g/dL (ref 30.0–36.0)
MCV: 88.8 fL (ref 78.0–100.0)
MONO ABS: 0.4 10*3/uL (ref 0.1–1.0)
MONOS PCT: 5 %
NEUTROS ABS: 4.9 10*3/uL (ref 1.7–7.7)
Neutrophils Relative %: 64 %
Platelets: 180 10*3/uL (ref 150–400)
RBC: 4.37 MIL/uL (ref 3.87–5.11)
RDW: 13.3 % (ref 11.5–15.5)
WBC: 7.5 10*3/uL (ref 4.0–10.5)

## 2015-05-01 LAB — POCT URINE PREGNANCY: Preg Test, Ur: NEGATIVE

## 2015-05-01 MED ORDER — METRONIDAZOLE 500 MG PO TABS: 500.0000 mg | ORAL_TABLET | Freq: Two times a day (BID) | ORAL | Status: DC

## 2015-05-01 MED ORDER — KETOROLAC TROMETHAMINE 60 MG/2ML IM SOLN
60.0000 mg | Freq: Once | INTRAMUSCULAR | Status: AC
  Administered 2015-05-01: 60 mg via INTRAMUSCULAR
  Filled 2015-05-01: qty 2

## 2015-05-01 MED ORDER — DOXYCYCLINE HYCLATE 100 MG PO CAPS: 100.0000 mg | ORAL_CAPSULE | Freq: Two times a day (BID) | ORAL | Status: DC

## 2015-05-01 NOTE — MAU Note (Signed)
Urine in lab 

## 2015-05-01 NOTE — Progress Notes (Signed)
Patient ID: BLAYNE GARLICK, female   DOB: Nov 04, 1965, 50 y.o.   MRN: [id number] GYNECOLOGY  VISIT   HPI: 49 y.o.   Married  Caucasian  female   G2P2 with Patient's last menstrual period was 04/12/2015 (exact date).   here for lower pelvic pain for 2-3 days. Patient also complains of lower back pain. She states this is different than symptoms related to her interstitial cystitis, lichen sclerosis, or fibromyalgia. She is also complaining of white vaginal discharge since treatment of BV at last office visit.  This does not itch, burn or have an odor.  Felt sudden onset of "brittle pain"  In lower abdomen and lower back and occurring deep inside.  Only thing to alleviate the pain was to sit down or lie down.  Took 2 Tylenol 3 which did not really treat the pain but calmed her mind down.  Used heating pad and used vaginal Valium suppository (takes for IC). Feels like the pain is increasing.  Some pain with urination.  Has IC.  Has IBS and having constipation.  Last Bm 2 days ago. Having nausea in the am.   Vaginal discharge started before the pain started.  Had negative Affirm on 04/20/15 at office visit.   Feeling increased fatigue.  Having menstrual cycles post ablation.   Living back in [place], [place]. Married.   Mother present for the discussion today.   Urine Dip: Neg UPT negative.  GYNECOLOGIC HISTORY: Patient's last menstrual period was 04/12/2015 (exact date). Contraception:Ablation Menopausal hormone therapy: none Last mammogram: 07-23-14 Diag.Bil.3D with U/S of Left breast--see Epic Last pap smear: 10-26-11 normal        OB History    Gravida Para Term Preterm AB TAB SAB Ectopic Multiple Living   Patient Active Problem List   Diagnosis Date Noted  . Fothergill's neuralgia 01/15/2014  . Rotator cuff syndrome 07/15/2013  . ANA positive 03/12/2013  . PCB (post coital bleeding) 01/18/2013  . Lichen sclerosus 12/03/2012  . Irritable bowel syndrome  12/03/2012  . Interstitial cystitis 12/03/2012  . Hypertriglyceridemia 10/25/2011  . Allergic rhinitis, seasonal 09/28/2011  . Adhesions of vagina 08/22/2011  . 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 Acne 12/21/2010  . Bipolar 1 disorder (HCC) 12/09/2010  . 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 Fibrositis 12/09/2010  . Anxiety, generalized 12/09/2010  . 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 Interstitial cystitis   . Fibromyalgia   . Anxiety   . Lichen sclerosus of female genitalia   . Manic depression (HCC)   . HSV-2 infection     rare occurence  . MS (multiple sclerosis) (HCC)   . IBS (irritable bowel syndrome)   . History of self mutilation   . Arm sprain 5/11    right   . Lichen sclerosus     Vulva  . STD (sexually transmitted disease) 1989    Hx of HSV II, genital warts  . Genital warts   . Abnormal Pap smear of cervix 1997    --hx of conization of cervix by Dr. Roberto Scales  . Broken arm     left arm by elbow    Past Surgical History  Procedure Laterality Date  . Endometrial ablation  1997/1998  . Breast enhancement surgery  1990  . Cervix lesion destruction  1997    Dr. Roberto Scales    Current Outpatient Prescriptions  Medication  Sig Dispense Refill  . acetaminophen-codeine (TYLENOL #3) 300-30 MG tablet Take 1 tablet by mouth 2 (two) times daily.    . bupivacaine (MARCAINE) 0.5 % injection Instill 15 cc into bladder daily    . clobetasol ointment (TEMOVATE) 0.05 % Apply 1 application topically 2 (two) times daily. (Patient taking differently: Apply 1 application topically daily as needed (flare-up). ) 60 g 0  . clonazePAM (KLONOPIN) 2 MG tablet Take 1 tablet (2 mg total) by mouth daily. 30 tablet   . heparin 26378 UNIT/ML injection Instill 4 cc into bladder daily    . hydrOXYzine (ATARAX/VISTARIL) 25 MG tablet Takes 2 tablets daily    . lamoTRIgine (LAMICTAL) 200 MG tablet Take 200 mg by mouth daily.    . montelukast (SINGULAIR) 10 MG  tablet Take 1 tablet (10 mg total) by mouth every morning.    . nitrofurantoin (MACRODANTIN) 50 MG capsule Take 50 mg by mouth daily.    Marland Kitchen omeprazole (PRILOSEC) 40 MG capsule Take 40 mg by mouth daily.    . polyethylene glycol (MIRALAX / GLYCOLAX) packet Take 17 g by mouth daily as needed for mild constipation.    . promethazine (PHENERGAN) 25 MG tablet Take 25 mg by mouth as needed.    Marland Kitchen QVAR 40 MCG/ACT inhaler Inhale 2 puffs into the lungs 2 (two) times daily.   5  . topiramate (TOPAMAX) 50 MG tablet Take 150 mg by mouth daily. Take 3 tablets daily  1  . traZODone (DESYREL) 100 MG tablet Take 2 tablets (200 mg total) by mouth at bedtime.    Pauline Aus HFA 45 MCG/ACT inhaler INHALE 2 PUFFS Q 4 TO 6 HOURS AS NEEDED FOR COUGH OR WHEEZE  1   No current facility-administered medications for this visit.     ALLERGIES: Cefzil; Ciprofloxacin; Lidocaine; Monistat; Neosporin; Quinolones; Septra; Valtrex; and Adhesive  Family History  Problem Relation Age of Onset  . Hypertension Mother   . Hyperlipidemia Mother   . Lung cancer Mother   . Thyroid cancer Mother   . COPD Mother   . Lung cancer Father 14  . Heart attack Father 50    3 vessel CABG  . Leukemia Maternal Grandmother   . Stroke Maternal Grandfather   . Thyroid disease Maternal Aunt   . Stroke Paternal Grandmother   . Heart failure Paternal Grandfather   . Osteoarthritis Maternal Aunt     Social History   Social History  . Marital Status: Married    Spouse Name: N/A  . Number of Children: N/A  . Years of Education: N/A   Occupational History  . Not on file.   Social History Main Topics  . Smoking status: Never Smoker   . Smokeless tobacco: Never Used  . Alcohol Use: 0.6 oz/week    1 Standard drinks or equivalent per week  . Drug Use: No  . Sexual Activity:    Partners: Male    Birth Control/ Protection: Surgical     Comment: Ablation   Other Topics Concern  . Not on file   Social History Narrative    ROS:   Pertinent items are noted in HPI.  PHYSICAL EXAMINATION:    BP 100/70 mmHg  Pulse 90  Temp(Src) 97.5 F (36.4 C) (Oral)  Resp 16  Ht 5\' 4"  (1.626 m)  Wt 134 lb 12.8 oz (61.145 kg)  BMI 23.13 kg/m2  LMP 04/12/2015 (Exact Date)    General appearance: alert, cooperative and appears stated age Head: Normocephalic, without  obvious abnormality, atraumatic   Lungs: clear to auscultation bilaterally Heart: regular rate and rhythm Abdomen: soft, non-tender; bowel sounds normal; no masses,  no organomegaly   Pelvic: External genitalia:  no lesions              Urethra:  normal appearing urethra with no masses, tenderness or lesions              Bartholins and Skenes: normal                 Vagina: normal appearing vagina with normal color and discharge, no lesions              Cervix: no lesions and has cervical motion tenderness.               Bimanual Exam:  Uterus:  small and tender uterus.              Adnexa: right adnexal tenderness and fullness.  Left adnexal tenderness.                 Chaperone was present for exam.  ASSESSMENT  Pelvic pain.  Possible right ovarian cyst versus PID. Interstitial cystitis.  Status post ablation.  Hx IBS. Vaginitis.   PLAN  Affirm and GC/CT done. Counseled regarding pelvic pain and vaginitis.  Will send to [facility] now for CBC with diff, CMP, pelvic ultrasound.  If ultrasound does not show ovarian cyst, I would recommend treatment for PID. Follow up in office after hospital evaluation is compete.   An After Visit Summary was printed and given to the patient.  __25____ minutes face to face time of which over 50% was spent in counseling.

## 2015-05-01 NOTE — Discharge Instructions (Signed)
Pelvic Pain, Female °Pelvic pain is pain felt below the belly button and between your hips. It can be caused by many different things. It is important to get help right away. This is especially true for severe, sharp, or unusual pain that comes on suddenly.  °HOME CARE °· Only take medicine as told by your doctor. °· Rest as told by your doctor. °· Eat a healthy diet, such as fruits, vegetables, and lean meats. °· Drink enough fluids to keep your pee (urine) clear or pale yellow, or as told. °· Avoid sex (intercourse) if it causes pain. °· Apply warm or cold packs to your lower belly (abdomen). Use the type of pack that helps the pain. °· Avoid situations that cause you stress. °· Keep a journal to track your pain. Write down: °¨ When the pain started. °¨ Where it is located. °¨ If there are things that seem to be related to the pain, such as food or your period. °· Follow up with your doctor as told. °GET HELP RIGHT AWAY IF:  °· You have heavy bleeding from the vagina. °· You have more pelvic pain. °· You feel lightheaded or pass out (faint). °· You have chills. °· You have pain when you pee or have blood in your pee. °· You cannot stop having watery poop (diarrhea). °· You cannot stop throwing up (vomiting). °· You have a fever or lasting symptoms for more than 3 days. °· You have a fever and your symptoms suddenly get worse. °· You are being physically or sexually abused. °· Your medicine does not help your pain. °· You have fluid (discharge) coming from your vagina that is not normal. °MAKE SURE YOU: °· Understand these instructions. °· Will watch your condition. °· Will get help if you are not doing well or get worse. °  °This information is not intended to replace advice given to you by your health care provider. Make sure you discuss any questions you have with your health care provider. °  °Document Released: 10/19/2007 Document Revised: 05/23/2014 Document Reviewed: 08/22/2011 °Elsevier Interactive Patient  Education ©2016 [employer] ° °

## 2015-05-01 NOTE — MAU Note (Addendum)
Patient presents from office with pelvic for since Tuesday and has gotten progressively worse since. Patient has also had discharge for a month now.

## 2015-05-01 NOTE — MAU Provider Note (Signed)
History     [name]: [id number]  Arrival date and time: 05/01/15 1434   First Provider Initiated Contact with Patient 05/01/15 1501      Chief Complaint  Patient presents with  . Pelvic Pain   HPI Erika Mccoy is 49 y.o. G2P2 Sent to MAU for further evaluation of pelvic pain after being seen in office today by Dr. Edward Jolly. States sxs began the first of Nov when she was treated for BV. Sxs did not resolve.  Was seen again for re evalution 2 weeks ago, testing was Neg.  3 days ago she began with bilateral lower pelvic pain that radiates to her lower back and top of her legs.  Describes as " a deep pain, a crushing pain".  Worse and current pain 10/10.  "Feels like little hammers banging on me".  Sitting helps pain.  After pain, "a flu like fatigue begins".  She states this fatigue is different than her Fibromyalgia and CFS fatigue.  Exam by Dr. Edward Jolly today in the office was described as painful. She reports STI screening was done today.  Hx of ablation for heavy bleeding.  Has hx of Lichen Sclerosis, Interstitial Cystis, IBS, and Anxiety.      Past Medical History  Diagnosis Date  . Interstitial cystitis   . Fibromyalgia   . Anxiety   . Lichen sclerosus of female genitalia   . Manic depression (HCC)   . HSV-2 infection     rare occurence  . MS (multiple sclerosis) (HCC)   . IBS (irritable bowel syndrome)   . History of self mutilation   . Arm sprain 5/11    right   . Lichen sclerosus     Vulva  . STD (sexually transmitted disease) 1989    Hx of HSV II, genital warts  . Genital warts   . Abnormal Pap smear of cervix 1997    --hx of conization of cervix by Dr. Roberto Scales  . Broken arm     left arm by elbow    Past Surgical History  Procedure Laterality Date  . Endometrial ablation  1997/1998  . Breast enhancement surgery  1990  . Cervix lesion destruction  1997    Dr. Roberto Scales  . Tonsillectomy    . Nasal sinus surgery      Family History  Problem Relation Age of Onset  .  Hypertension Mother   . Hyperlipidemia Mother   . Lung cancer Mother   . Thyroid cancer Mother   . COPD Mother   . Lung cancer Father 84  . Heart attack Father 50    3 vessel CABG  . Leukemia Maternal Grandmother   . Stroke Maternal Grandfather   . Thyroid disease Maternal Aunt   . Stroke Paternal Grandmother   . Heart failure Paternal Grandfather   . Osteoarthritis Maternal Aunt     Social History  Substance Use Topics  . Smoking status: Never Smoker   . Smokeless tobacco: Never Used  . Alcohol Use: 0.6 oz/week    1 Standard drinks or equivalent per week    Allergies:  Allergies  Allergen Reactions  . Cefzil [Cefprozil] Hives  . Ciprofloxacin     Small vessel vasculitis Unknown (Vasculitis)  . Lidocaine     Ointment caused burning.  . Monistat [Miconazole] Other (See Comments)    Reaction unknown  . Neosporin [Neomycin-Bacitracin Zn-Polymyx] Swelling    Hot  . Quinolones     Vasculitis with Levaquin and Cipro  . Septra [Sulfamethoxazole-Trimethoprim]  Hives  . Valtrex [Valacyclovir Hcl]     Vomiting, diarrhea, and abdominal cramping.   . Adhesive [Tape] Rash and Other (See Comments)    Pulls skin off    Prescriptions prior to admission  Medication Sig Dispense Refill Last Dose  . acetaminophen-codeine (TYLENOL #3) 300-30 MG tablet Take 1 tablet by mouth 2 (two) times daily.   04/30/2015 at Unknown time  . bupivacaine (MARCAINE) 0.5 % injection Instill 15 cc into bladder daily   05/01/2015 at Unknown time  . clobetasol ointment (TEMOVATE) 0.05 % Apply 1 application topically 2 (two) times daily. 60 g 0 04/30/2015 at Unknown time  . clonazePAM (KLONOPIN) 2 MG tablet Take 1 tablet (2 mg total) by mouth daily. 30 tablet  04/30/2015 at Unknown time  . heparin 16109 UNIT/ML injection Instill 4 cc into bladder daily   05/01/2015 at Unknown time  . hydrOXYzine (ATARAX/VISTARIL) 25 MG tablet Takes 2 tablets daily   04/30/2015 at Unknown time  . lamoTRIgine (LAMICTAL) 200  MG tablet Take 200 mg by mouth daily.   04/30/2015 at Unknown time  . montelukast (SINGULAIR) 10 MG tablet Take 1 tablet (10 mg total) by mouth every morning.   04/30/2015 at Unknown time  . nitrofurantoin (MACRODANTIN) 50 MG capsule Take 50 mg by mouth daily.   04/30/2015 at Unknown time  . omeprazole (PRILOSEC) 40 MG capsule Take 40 mg by mouth daily.   04/30/2015 at Unknown time  . polyethylene glycol (MIRALAX / GLYCOLAX) packet Take 17 g by mouth daily as needed for mild constipation.   04/30/2015 at Unknown time  . Probiotic Product (PROBIOTIC PO) Take 1 tablet by mouth daily.   04/30/2015 at Unknown time  . promethazine (PHENERGAN) 25 MG tablet Take 25 mg by mouth as needed.   Taking  . QVAR 40 MCG/ACT inhaler Inhale 2 puffs into the lungs 2 (two) times daily.   5 Taking  . topiramate (TOPAMAX) 50 MG tablet Take 150 mg by mouth daily. Take 3 tablets daily  1 04/30/2015 at Unknown time  . traZODone (DESYREL) 100 MG tablet Take 2 tablets (200 mg total) by mouth at bedtime.   04/30/2015 at Unknown time  . XOPENEX HFA 45 MCG/ACT inhaler INHALE 2 PUFFS Q 4 TO 6 HOURS AS NEEDED FOR COUGH OR WHEEZE  1 Taking    Review of Systems  Constitutional: Negative for fever and chills.  Gastrointestinal: Positive for nausea and abdominal pain. Negative for vomiting. Diarrhea: bilateral lower pelvic pain.  Genitourinary: Negative for dysuria, urgency and frequency.       + for vaginal discharge.  Neg for bleeding,  Hx of ablation  Musculoskeletal: Positive for back pain (lower back pain ( radiating from pelvic pain)).  Neurological: Negative for headaches.   Physical Exam   Blood pressure 102/71, pulse 82, temperature 97.9 F (36.6 C), temperature source Oral, resp. rate 17, last menstrual period 04/12/2015.  Physical Exam  Nursing note and vitals reviewed. Constitutional: She is oriented to person, place, and time. She appears well-developed and well-nourished. No distress.  HENT:  Head:  Normocephalic.  Neck: Normal range of motion. Neck supple.  Cardiovascular: Normal rate.   Respiratory: Effort normal and breath sounds normal. No respiratory distress.  GI: Soft. There is tenderness (diffuse tenderness). There is no rebound and no guarding.  Genitourinary:  Deferred.  Had pelvic prior to admission in the office by Dr. Edward Jolly.  Musculoskeletal: Normal range of motion. She exhibits no edema.  Neurological: She is alert and  oriented to person, place, and time.  Skin: Skin is warm and dry.  Psychiatric: Thought content normal.  Tearful and anxious at times.    Results for orders placed or performed during the hospital encounter of 05/01/15 (from the past 24 hour(s))  CBC with Differential/Platelet     Status: None   Collection Time: 05/01/15  3:32 PM  Result Value Ref Range   WBC 7.5 4.0 - 10.5 K/uL   RBC 4.37 3.87 - 5.11 MIL/uL   Hemoglobin 13.1 12.0 - 15.0 g/dL   HCT 16.1 09.6 - 04.5 %   MCV 88.8 78.0 - 100.0 fL   MCH 30.0 26.0 - 34.0 pg   MCHC 33.8 30.0 - 36.0 g/dL   RDW 40.9 81.1 - 91.4 %   Platelets 180 150 - 400 K/uL   Neutrophils Relative % 64 %   Neutro Abs 4.9 1.7 - 7.7 K/uL   Lymphocytes Relative 28 %   Lymphs Abs 2.1 0.7 - 4.0 K/uL   Monocytes Relative 5 %   Monocytes Absolute 0.4 0.1 - 1.0 K/uL   Eosinophils Relative 2 %   Eosinophils Absolute 0.1 0.0 - 0.7 K/uL   Basophils Relative 1 %   Basophils Absolute 0.0 0.0 - 0.1 K/uL  Comprehensive metabolic panel     Status: Abnormal   Collection Time: 05/01/15  3:32 PM  Result Value Ref Range   Sodium 136 135 - 145 mmol/L   Potassium 4.4 3.5 - 5.1 mmol/L   Chloride 105 101 - 111 mmol/L   CO2 24 22 - 32 mmol/L   Glucose, Bld 101 (H) 65 - 99 mg/dL   BUN 16 6 - 20 mg/dL   Creatinine, Ser 7.82 0.44 - 1.00 mg/dL   Calcium 9.0 8.9 - 95.6 mg/dL   Total Protein 7.0 6.5 - 8.1 g/dL   Albumin 4.1 3.5 - 5.0 g/dL   AST 14 (L) 15 - 41 U/L   ALT 10 (L) 14 - 54 U/L   Alkaline Phosphatase 59 38 - 126 U/L   Total  Bilirubin 0.5 0.3 - 1.2 mg/dL   GFR calc non Af Amer >60 >60 mL/min   GFR calc Af Amer >60 >60 mL/min   Anion gap 7 5 - 15  US Transvaginal Non-ob  05/01/2015  CLINICAL DATA:  Pelvic pain, back pain for 3 days EXAM: TRANSABDOMINAL AND TRANSVAGINAL ULTRASOUND OF PELVIS TECHNIQUE: Both transabdominal and transvaginal ultrasound examinations of the pelvis were performed. Transabdominal technique was performed for global imaging of the pelvis including uterus, ovaries, adnexal regions, and pelvic cul-de-sac. It was necessary to proceed with endovaginal exam following the transabdominal exam to visualize the uterus, endometrium, ovaries and adnexa . COMPARISON:  CT 10/09/2013 FINDINGS: Uterus Measurements: 8.3 x 4.1 x 5.0 cm. No fibroids or other mass visualized. Endometrium Thickness: Poorly seen due to prior ablation. Right ovary Measurements: 3.2 x 2.7 x 2.7 cm. Small hemorrhagic follicle, 1.2 cm. Normal appearance/no adnexal mass. Left ovary Measurements: 3.4 x 1.4 x 1.2 cm. Normal appearance/no adnexal mass. Other findings Small amount of free fluid in the pelvis. IMPRESSION: Unremarkable study.  No acute findings. Electronically Signed   By: Charlett Nose M.D.   On: 05/01/2015 16:14   US Pelvis Complete  05/01/2015  CLINICAL DATA:  Pelvic pain, back pain for 3 days EXAM: TRANSABDOMINAL AND TRANSVAGINAL ULTRASOUND OF PELVIS TECHNIQUE: Both transabdominal and transvaginal ultrasound examinations of the pelvis were performed. Transabdominal technique was performed for global imaging of the pelvis including uterus, ovaries,  adnexal regions, and pelvic cul-de-sac. It was necessary to proceed with endovaginal exam following the transabdominal exam to visualize the uterus, endometrium, ovaries and adnexa . COMPARISON:  CT 10/09/2013 FINDINGS: Uterus Measurements: 8.3 x 4.1 x 5.0 cm. No fibroids or other mass visualized. Endometrium Thickness: Poorly seen due to prior ablation. Right ovary Measurements: 3.2 x 2.7  x 2.7 cm. Small hemorrhagic follicle, 1.2 cm. Normal appearance/no adnexal mass. Left ovary Measurements: 3.4 x 1.4 x 1.2 cm. Normal appearance/no adnexal mass. Other findings Small amount of free fluid in the pelvis. IMPRESSION: Unremarkable study.  No acute findings. Electronically Signed   By: Charlett Nose M.D.   On: 05/01/2015 16:14    MAU Course  Procedures  MDM Dr. Edward Jolly called prior to patient's admission with orders for CBC/diff, CMP and Pelvic ultrasound.  If neg ultrasound, treat for PID.  MSE Labs Exam U/S Discussed with Dr. Oscar La, she will contact Dr.Silva and call with plan of care.   Dr.Silva returned call for discussion of plan of care.  MSE, exam, U/S and labs.  Order given to give Toradol 60mg  IM now, Rx for Doxycycline and Flagyl for 2 weeks for home and have her f/u in the office on Monday.  GC/CHL cultures done in office today, results pending.  Because of her allergies, will not treat for GC/ until culture back.  If sxs worsen return to MAU over the weekend. Toradol 60mg  Im given in MAU Assessment and Plan  A:  Bilateral pelvic pain      History of endometrial ablation       P:  Rx for Doxycycline and Flagyl to pharmacy      F/U on Monday in office      Return for worsening sxs.  Yoana Staib,EVE M 05/01/2015, 4:52 PM

## 2015-05-02 LAB — WET PREP BY MOLECULAR PROBE
Candida species: NEGATIVE
Gardnerella vaginalis: POSITIVE — AB
TRICHOMONAS VAG: NEGATIVE

## 2015-05-04 ENCOUNTER — Telehealth: Payer: Self-pay | Admitting: Obstetrics and Gynecology

## 2015-05-04 ENCOUNTER — Encounter: Payer: Self-pay | Admitting: Obstetrics and Gynecology

## 2015-05-04 ENCOUNTER — Ambulatory Visit (INDEPENDENT_AMBULATORY_CARE_PROVIDER_SITE_OTHER): Payer: BLUE CROSS/BLUE SHIELD | Admitting: Obstetrics and Gynecology

## 2015-05-04 VITALS — BP 110/72 | HR 88 | Temp 98.9°F | Ht 64.0 in | Wt 138.4 lb

## 2015-05-04 DIAGNOSIS — M25559 Pain in unspecified hip: Secondary | ICD-10-CM

## 2015-05-04 DIAGNOSIS — N739 Female pelvic inflammatory disease, unspecified: Secondary | ICD-10-CM

## 2015-05-04 NOTE — Telephone Encounter (Signed)
Entered by Patton Salles, MD at 05/03/2015 12:00 PM    Erika Mccoy,   Your vaginitis testing showed evidence of bacterial vaginosis. The Flagyl prescription that you received in the hospital will treat this.   The testing for gonorrhea and chlamydia are not back yet.   Please call the office early tomorrow morning and make an appointment to see me tomorrow as well.   I hope you are feeling better.   Thank you,   [name], MD   Spoke with patient. Advised of message as seen above from Dr.Silva. Patient is agreeable and verbalizes understanding. Patient has a follow up appointment scheduled for today 12/19 at 9:30 am with Dr.Silva.  Routing to provider for final review. Patient agreeable to disposition. Will close encounter.

## 2015-05-04 NOTE — Telephone Encounter (Signed)
Patient called to cancel AEX appointment for tomorrow she said she's been in so much lately and is not comfortable with having another exam. She said she will call us back to reschedule.

## 2015-05-04 NOTE — Progress Notes (Signed)
Patient ID: Erika Mccoy, female   DOB: [birth year], 49 y.o.   MRN: [id number] GYNECOLOGY  VISIT   HPI: 49 y.o.   Married  Caucasian  female   G2P2 with Patient's last menstrual period was 04/12/2015 (exact date).   here for follow up visit.   Seen in office for pelvic pain on 05/01/15.  Was sent to the [facility] for blood work.  Normal CBC. Pelvic ultrasound showed small hemorrhagic right ovarian cyst, 1.2 cm, and small amount of free fluid in pelvis. Treated for PID with Doxycycline and Flagyl.  Allergy to second generation cephalosporin so did not receive Ceftriaxone.  Also has Levaquin and Cipro allergy.  GC/CT are pending.   Feels pain and runs down her thighs.  Lower back pain is better. Has seen Dr. Sherron Monday in the past for this kind of pain.   Does daily instillations at home - Marcaine and heparin.  Off Macrobid.   Dr. Logan Bores called in Tylenol 3 for patient not long ago due to pain flare from her IC.  Patient states she is a "cutter." Has increased cutting since marrying and moving back to [place]. Sees a therapist and a psychiatrist.  Denies suicidal ideation.    GYNECOLOGIC HISTORY: Patient's last menstrual period was 04/12/2015 (exact date). Contraception:ablation Menopausal hormone therapy: n/a Last mammogram: 07-23-14 Diag.Bil.3D with U/S of Left Breast--see Epic Last pap smear: 10-26-11 normal        OB History    Gravida Para Term Preterm AB TAB SAB Ectopic Multiple Living   Patient Active Problem List   Diagnosis Date Noted  . Fothergill's neuralgia 01/15/2014  . Rotator cuff syndrome 07/15/2013  . ANA positive 03/12/2013  . PCB (post coital bleeding) 01/18/2013  . Lichen sclerosus 12/03/2012  . Irritable bowel syndrome 12/03/2012  . Interstitial cystitis 12/03/2012  . Hypertriglyceridemia 10/25/2011  . Allergic rhinitis, seasonal 09/28/2011  . Adhesions of vagina 08/22/2011  . 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 Acne  12/21/2010  . Bipolar 1 disorder (HCC) 12/09/2010  . 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 Fibrositis 12/09/2010  . Anxiety, generalized 12/09/2010  . 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 Interstitial cystitis   . Fibromyalgia   . Anxiety   . Lichen sclerosus of female genitalia   . Manic depression (HCC)   . HSV-2 infection     rare occurence  . MS (multiple sclerosis) (HCC)   . IBS (irritable bowel syndrome)   . History of self mutilation   . Arm sprain 5/11    right   . Lichen sclerosus     Vulva  . STD (sexually transmitted disease) 1989    Hx of HSV II, genital warts  . Genital warts   . Abnormal Pap smear of cervix 1997    --hx of conization of cervix by Dr. Roberto Scales  . Broken arm     left arm by elbow    Past Surgical History  Procedure Laterality Date  . Endometrial ablation  1997/1998  . Breast enhancement surgery  1990  . Cervix lesion destruction  1997    Dr. Roberto Scales  . Tonsillectomy    . Nasal sinus surgery      Current Outpatient Prescriptions  Medication Sig Dispense Refill  . acetaminophen-codeine (TYLENOL #3) 300-30 MG tablet Take 1 tablet by mouth 2 (two) times daily.    Marland Kitchen  bupivacaine (MARCAINE) 0.5 % injection Instill 15 cc into bladder daily    . clobetasol ointment (TEMOVATE) 0.05 % Apply 1 application topically 2 (two) times daily. 60 g 0  . clonazePAM (KLONOPIN) 2 MG tablet Take 1 tablet (2 mg total) by mouth daily. 30 tablet   . doxycycline (VIBRAMYCIN) 100 MG capsule Take 1 capsule (100 mg total) by mouth 2 (two) times daily. 28 capsule 0  . heparin 16109 UNIT/ML injection Instill 4 cc into bladder daily    . hydrOXYzine (ATARAX/VISTARIL) 25 MG tablet Takes 2 tablets daily    . lamoTRIgine (LAMICTAL) 200 MG tablet Take 200 mg by mouth daily.    . metroNIDAZOLE (FLAGYL) 500 MG tablet Take 1 tablet (500 mg total) by mouth 2 (two) times daily. 28 tablet 0  . metroNIDAZOLE (METROGEL) 0.75 % gel Apply 1  application topically 2 (two) times daily.  5  . montelukast (SINGULAIR) 10 MG tablet Take 1 tablet (10 mg total) by mouth every morning.    . nitrofurantoin (MACRODANTIN) 50 MG capsule Take 50 mg by mouth daily.    Marland Kitchen omeprazole (PRILOSEC) 40 MG capsule Take 40 mg by mouth daily.    . polyethylene glycol (MIRALAX / GLYCOLAX) packet Take 17 g by mouth daily as needed for mild constipation.    . Probiotic Product (PROBIOTIC PO) Take 1 tablet by mouth daily.    . promethazine (PHENERGAN) 25 MG tablet Take 25 mg by mouth as needed.    Marland Kitchen QVAR 40 MCG/ACT inhaler Inhale 2 puffs into the lungs 2 (two) times daily.   5  . topiramate (TOPAMAX) 50 MG tablet Take 150 mg by mouth daily. Take 3 tablets daily  1  . traZODone (DESYREL) 100 MG tablet Take 2 tablets (200 mg total) by mouth at bedtime.    Pauline Aus HFA 45 MCG/ACT inhaler INHALE 2 PUFFS Q 4 TO 6 HOURS AS NEEDED FOR COUGH OR WHEEZE  1   No current facility-administered medications for this visit.     ALLERGIES: Cefzil; Ciprofloxacin; Lidocaine; Monistat; Neosporin; Quinolones; Septra; Valtrex; and Adhesive  Family History  Problem Relation Age of Onset  . Hypertension Mother   . Hyperlipidemia Mother   . Lung cancer Mother   . Thyroid cancer Mother   . COPD Mother   . Lung cancer Father 72  . Heart attack Father 50    3 vessel CABG  . Leukemia Maternal Grandmother   . Stroke Maternal Grandfather   . Thyroid disease Maternal Aunt   . Stroke Paternal Grandmother   . Heart failure Paternal Grandfather   . Osteoarthritis Maternal Aunt     Social History   Social History  . Marital Status: Married    Spouse Name: N/A  . Number of Children: N/A  . Years of Education: N/A   Occupational History  . Not on file.   Social History Main Topics  . Smoking status: Never Smoker   . Smokeless tobacco: Never Used  . Alcohol Use: 0.6 oz/week    1 Standard drinks or equivalent per week  . Drug Use: 1.00 per week    Special: Marijuana  .  Sexual Activity:    Partners: Male    Pharmacist, hospital Protection: Surgical     Comment: Ablation   Other Topics Concern  . Not on file   Social History Narrative    ROS:  Pertinent items are noted in HPI.  PHYSICAL EXAMINATION:    BP 110/72 mmHg  Pulse 88  Temp(Src) 98.9 F (37.2 C) (Oral)  Ht 5\' 4"  (1.626 m)  Wt 138 lb 6.4 oz (62.778 kg)  BMI 23.74 kg/m2  LMP 04/12/2015 (Exact Date)    General appearance: alert, cooperative and appears stated age.  Quiet affect today.  Abdomen:  Tender to palpation of the abdomen, more in left lower quadrant than the right lower quadrant.   Pelvic: External genitalia:  no lesions              Urethra:  normal appearing urethra with no masses, tenderness or lesions              Bartholins and Skenes: normal                 Vagina: normal appearing vagina with normal color and discharge, no lesions              Cervix: no lesions and no CMT.               Bimanual Exam:  Uterus:  normal size, contour, position, consistency, mobility, non-tender              Adnexa: normal adnexa and no mass, fullness, tenderness  Notes abdominal had is tender with bimanual exam.        Left ankle - transverse incisions - once with eschar.   Chaperone was present for exam.  ASSESSMENT  Pelvic pain - IC flare versus pelvic inflammatory disease.  Recent Affirm showing BV. Self mutilation.  Under care of psychiatry and psychology.   PLAN  Counseled regarding her ultrasound. Counseled regarding possible etiologies of pelvic pain.   I recommend she finish the Doxycycline and Flagyl.  Follow up GC/CT. Return for a recheck in 2 weeks, sooner if needed. Continue care of psychiatry and psychology providers.    An After Visit Summary was printed and given to the patient.  _25_____ minutes face to face time of which over 50% was spent in counseling.

## 2015-05-04 NOTE — Telephone Encounter (Signed)
I agree with canceling the annual exam. She has a recheck appointment with me in 2 weeks.

## 2015-05-04 NOTE — Telephone Encounter (Signed)
Patient calling in regards to lab results that were done 05/01/2015. Best # to reach patient [phone]

## 2015-05-05 ENCOUNTER — Encounter (HOSPITAL_COMMUNITY): Payer: Self-pay

## 2015-05-05 ENCOUNTER — Encounter (HOSPITAL_COMMUNITY): Payer: Self-pay | Admitting: *Deleted

## 2015-05-05 ENCOUNTER — Emergency Department (HOSPITAL_COMMUNITY)
Admission: EM | Admit: 2015-05-05 | Discharge: 2015-05-05 | Disposition: A | Discharge status: Other Acute Inpatient Hospital | Payer: BLUE CROSS/BLUE SHIELD | Attending: Emergency Medicine | Admitting: Emergency Medicine

## 2015-05-05 ENCOUNTER — Ambulatory Visit: Payer: BLUE CROSS/BLUE SHIELD | Admitting: Nurse Practitioner

## 2015-05-05 ENCOUNTER — Inpatient Hospital Stay (HOSPITAL_COMMUNITY)
Admission: AD | Admit: 2015-05-05 | Discharge: 2015-05-10 | DRG: 885 | Disposition: A | Discharge status: Home / Self Care | Payer: BLUE CROSS/BLUE SHIELD | Source: Intra-hospital | Attending: Psychiatry | Admitting: Psychiatry

## 2015-05-05 DIAGNOSIS — Z8619 Personal history of other infectious and parasitic diseases: Secondary | ICD-10-CM | POA: Diagnosis not present

## 2015-05-05 DIAGNOSIS — Z79899 Other long term (current) drug therapy: Secondary | ICD-10-CM | POA: Diagnosis not present

## 2015-05-05 DIAGNOSIS — F1110 Opioid abuse, uncomplicated: Secondary | ICD-10-CM | POA: Diagnosis not present

## 2015-05-05 DIAGNOSIS — F1310 Sedative, hypnotic or anxiolytic abuse, uncomplicated: Secondary | ICD-10-CM | POA: Insufficient documentation

## 2015-05-05 DIAGNOSIS — L900 Lichen sclerosus et atrophicus: Secondary | ICD-10-CM

## 2015-05-05 DIAGNOSIS — F32A Depression, unspecified: Secondary | ICD-10-CM

## 2015-05-05 DIAGNOSIS — Z7951 Long term (current) use of inhaled steroids: Secondary | ICD-10-CM | POA: Insufficient documentation

## 2015-05-05 DIAGNOSIS — X788XXA Intentional self-harm by other sharp object, initial encounter: Secondary | ICD-10-CM | POA: Insufficient documentation

## 2015-05-05 DIAGNOSIS — Z7901 Long term (current) use of anticoagulants: Secondary | ICD-10-CM | POA: Insufficient documentation

## 2015-05-05 DIAGNOSIS — Z872 Personal history of diseases of the skin and subcutaneous tissue: Secondary | ICD-10-CM | POA: Diagnosis not present

## 2015-05-05 DIAGNOSIS — F329 Major depressive disorder, single episode, unspecified: Secondary | ICD-10-CM | POA: Diagnosis present

## 2015-05-05 DIAGNOSIS — Y998 Other external cause status: Secondary | ICD-10-CM | POA: Insufficient documentation

## 2015-05-05 DIAGNOSIS — Y92009 Unspecified place in unspecified non-institutional (private) residence as the place of occurrence of the external cause: Secondary | ICD-10-CM | POA: Insufficient documentation

## 2015-05-05 DIAGNOSIS — Y9389 Activity, other specified: Secondary | ICD-10-CM | POA: Insufficient documentation

## 2015-05-05 DIAGNOSIS — Z792 Long term (current) use of antibiotics: Secondary | ICD-10-CM | POA: Insufficient documentation

## 2015-05-05 DIAGNOSIS — Z8669 Personal history of other diseases of the nervous system and sense organs: Secondary | ICD-10-CM | POA: Insufficient documentation

## 2015-05-05 DIAGNOSIS — Z87448 Personal history of other diseases of urinary system: Secondary | ICD-10-CM | POA: Insufficient documentation

## 2015-05-05 DIAGNOSIS — S60812A Abrasion of left wrist, initial encounter: Secondary | ICD-10-CM | POA: Insufficient documentation

## 2015-05-05 DIAGNOSIS — F419 Anxiety disorder, unspecified: Secondary | ICD-10-CM | POA: Insufficient documentation

## 2015-05-05 DIAGNOSIS — M797 Fibromyalgia: Secondary | ICD-10-CM | POA: Insufficient documentation

## 2015-05-05 DIAGNOSIS — Z8742 Personal history of other diseases of the female genital tract: Secondary | ICD-10-CM | POA: Insufficient documentation

## 2015-05-05 DIAGNOSIS — F3110 Bipolar disorder, current episode manic without psychotic features, unspecified: Secondary | ICD-10-CM | POA: Diagnosis not present

## 2015-05-05 DIAGNOSIS — R45851 Suicidal ideations: Secondary | ICD-10-CM | POA: Diagnosis present

## 2015-05-05 LAB — SALICYLATE LEVEL: Salicylate Lvl: <4 mg/dL (ref 2.8–30.0)

## 2015-05-05 LAB — CBC
HEMATOCRIT: 39.9 % (ref 36.0–46.0)
Hemoglobin: 13.3 g/dL (ref 12.0–15.0)
MCH: 30 pg (ref 26.0–34.0)
MCHC: 33.3 g/dL (ref 30.0–36.0)
MCV: 89.9 fL (ref 78.0–100.0)
PLATELETS: 200 10*3/uL (ref 150–400)
RBC: 4.44 MIL/uL (ref 3.87–5.11)
RDW: 13 % (ref 11.5–15.5)
WBC: 6.9 10*3/uL (ref 4.0–10.5)

## 2015-05-05 LAB — IPS N GONORRHOEA AND CHLAMYDIA BY PCR

## 2015-05-05 LAB — COMPREHENSIVE METABOLIC PANEL
ALBUMIN: 4.4 g/dL (ref 3.5–5.0)
ALT: 11 U/L — ABNORMAL LOW (ref 14–54)
ANION GAP: 10 (ref 5–15)
AST: 17 U/L (ref 15–41)
Alkaline Phosphatase: 55 U/L (ref 38–126)
BILIRUBIN TOTAL: 0.8 mg/dL (ref 0.3–1.2)
BUN: 11 mg/dL (ref 6–20)
CHLORIDE: 109 mmol/L (ref 101–111)
CO2: 20 mmol/L — ABNORMAL LOW (ref 22–32)
Calcium: 9.1 mg/dL (ref 8.9–10.3)
Creatinine, Ser: 0.85 mg/dL (ref 0.44–1.00)
GFR calc Af Amer: >60 mL/min (ref >60)
GFR calc non Af Amer: >60 mL/min (ref >60)
GLUCOSE: 95 mg/dL (ref 65–99)
POTASSIUM: 4.3 mmol/L (ref 3.5–5.1)
SODIUM: 139 mmol/L (ref 135–145)
TOTAL PROTEIN: 7.2 g/dL (ref 6.5–8.1)

## 2015-05-05 LAB — RAPID URINE DRUG SCREEN, HOSP PERFORMED
Amphetamines: NOT DETECTED
BENZODIAZEPINES: POSITIVE — AB
Barbiturates: NOT DETECTED
COCAINE: NOT DETECTED
Opiates: POSITIVE — AB
Tetrahydrocannabinol: NOT DETECTED

## 2015-05-05 LAB — ACETAMINOPHEN LEVEL

## 2015-05-05 LAB — ETHANOL: Alcohol, Ethyl (B): <5 mg/dL (ref <5)

## 2015-05-05 MED ORDER — MONTELUKAST SODIUM 10 MG PO TABS
10.0000 mg | ORAL_TABLET | Freq: Every day | ORAL | Status: DC
  Administered 2015-05-05: 10 mg via ORAL
  Filled 2015-05-05: qty 1

## 2015-05-05 MED ORDER — MONTELUKAST SODIUM 10 MG PO TABS
10.0000 mg | ORAL_TABLET | ORAL | Status: DC
  Administered 2015-05-06 – 2015-05-10 (×5): 10 mg via ORAL
  Filled 2015-05-05 (×9): qty 1

## 2015-05-05 MED ORDER — DOXYCYCLINE HYCLATE 100 MG PO TABS
100.0000 mg | ORAL_TABLET | Freq: Two times a day (BID) | ORAL | Status: DC
  Administered 2015-05-06 – 2015-05-09 (×8): 100 mg via ORAL
  Filled 2015-05-05 (×12): qty 1

## 2015-05-05 MED ORDER — METRONIDAZOLE 0.75 % EX GEL
1.0000 "application " | Freq: Two times a day (BID) | CUTANEOUS | Status: DC
  Administered 2015-05-05: 1 via TOPICAL
  Filled 2015-05-05: qty 45

## 2015-05-05 MED ORDER — METRONIDAZOLE 0.75 % EX GEL: 1.0000 "application " | Freq: Two times a day (BID) | CUTANEOUS | Status: DC

## 2015-05-05 MED ORDER — BECLOMETHASONE DIPROPIONATE 40 MCG/ACT IN AERS
2.0000 | INHALATION_SPRAY | Freq: Two times a day (BID) | RESPIRATORY_TRACT | Status: DC
  Administered 2015-05-06: 2 via RESPIRATORY_TRACT
  Filled 2015-05-05: qty 8.7

## 2015-05-05 MED ORDER — METRONIDAZOLE 0.75 % EX GEL
1.0000 "application " | Freq: Two times a day (BID) | CUTANEOUS | Status: DC
  Filled 2015-05-05: qty 45

## 2015-05-05 MED ORDER — HYDROXYZINE HCL 50 MG PO TABS
50.0000 mg | ORAL_TABLET | Freq: Every day | ORAL | Status: DC
  Administered 2015-05-06 – 2015-05-09 (×2): 50 mg via ORAL
  Filled 2015-05-05: qty 2
  Filled 2015-05-05 (×4): qty 1
  Filled 2015-05-05: qty 2
  Filled 2015-05-05 (×2): qty 1

## 2015-05-05 MED ORDER — TOPIRAMATE 100 MG PO TABS
150.0000 mg | ORAL_TABLET | Freq: Every day | ORAL | Status: DC
  Filled 2015-05-05: qty 2

## 2015-05-05 MED ORDER — CLOBETASOL PROPIONATE 0.05 % EX OINT
1.0000 "application " | TOPICAL_OINTMENT | Freq: Two times a day (BID) | CUTANEOUS | Status: DC
  Administered 2015-05-08 – 2015-05-10 (×4): 1 via TOPICAL
  Filled 2015-05-05: qty 15

## 2015-05-05 MED ORDER — TOPIRAMATE 25 MG PO TABS
150.0000 mg | ORAL_TABLET | Freq: Every day | ORAL | Status: DC
  Administered 2015-05-05: 150 mg via ORAL
  Filled 2015-05-05: qty 1

## 2015-05-05 MED ORDER — TRAZODONE HCL 100 MG PO TABS
200.0000 mg | ORAL_TABLET | Freq: Every day | ORAL | Status: DC
  Administered 2015-05-05: 200 mg via ORAL
  Filled 2015-05-05 (×2): qty 2

## 2015-05-05 MED ORDER — FLUTICASONE PROPIONATE 50 MCG/ACT NA SUSP
2.0000 | Freq: Every day | NASAL | Status: DC
  Administered 2015-05-05 – 2015-05-10 (×5): 2 via NASAL
  Filled 2015-05-05 (×2): qty 16

## 2015-05-05 MED ORDER — PROMETHAZINE HCL 25 MG PO TABS: 25.0000 mg | ORAL_TABLET | Freq: Four times a day (QID) | ORAL | Status: DC | PRN

## 2015-05-05 MED ORDER — CLONAZEPAM 1 MG PO TABS: 2.0000 mg | ORAL_TABLET | Freq: Every day | ORAL | Status: DC

## 2015-05-05 MED ORDER — LAMOTRIGINE 200 MG PO TABS
200.0000 mg | ORAL_TABLET | Freq: Every day | ORAL | Status: DC
  Administered 2015-05-06: 200 mg via ORAL
  Filled 2015-05-05: qty 1
  Filled 2015-05-05: qty 2
  Filled 2015-05-05 (×2): qty 1

## 2015-05-05 MED ORDER — HYDROXYZINE HCL 25 MG PO TABS: 50.0000 mg | ORAL_TABLET | Freq: Every day | ORAL | Status: DC

## 2015-05-05 MED ORDER — METRONIDAZOLE 0.75 % EX GEL
1.0000 "application " | Freq: Two times a day (BID) | CUTANEOUS | Status: DC
  Administered 2015-05-06 – 2015-05-10 (×9): 1 via TOPICAL
  Filled 2015-05-05: qty 45

## 2015-05-05 MED ORDER — HYDROXYZINE HCL 50 MG PO TABS
50.0000 mg | ORAL_TABLET | Freq: Every day | ORAL | Status: DC
  Administered 2015-05-05: 50 mg via ORAL
  Filled 2015-05-05 (×2): qty 1

## 2015-05-05 MED ORDER — ALUM & MAG HYDROXIDE-SIMETH 200-200-20 MG/5ML PO SUSP: 30.0000 mL | ORAL | Status: DC | PRN

## 2015-05-05 MED ORDER — TRAZODONE HCL 100 MG PO TABS
200.0000 mg | ORAL_TABLET | Freq: Every day | ORAL | Status: DC
  Administered 2015-05-06 – 2015-05-09 (×4): 200 mg via ORAL
  Filled 2015-05-05: qty 2
  Filled 2015-05-05: qty 4
  Filled 2015-05-05 (×2): qty 2
  Filled 2015-05-05: qty 4
  Filled 2015-05-05 (×3): qty 2

## 2015-05-05 MED ORDER — ZOLPIDEM TARTRATE 5 MG PO TABS: 5.0000 mg | ORAL_TABLET | Freq: Every evening | ORAL | Status: DC | PRN

## 2015-05-05 MED ORDER — BUPIVACAINE HCL 0.5 % IJ SOLN
15.0000 mL | Freq: Every day | INTRAMUSCULAR | Status: DC
  Administered 2015-05-06: 15 mL

## 2015-05-05 MED ORDER — METRONIDAZOLE 500 MG PO TABS
500.0000 mg | ORAL_TABLET | Freq: Two times a day (BID) | ORAL | Status: DC
  Administered 2015-05-06 – 2015-05-09 (×8): 500 mg via ORAL
  Filled 2015-05-05 (×2): qty 1
  Filled 2015-05-05: qty 2
  Filled 2015-05-05 (×7): qty 1
  Filled 2015-05-05 (×2): qty 2
  Filled 2015-05-05: qty 1

## 2015-05-05 MED ORDER — POLYETHYLENE GLYCOL 3350 17 G PO PACK: 17.0000 g | PACK | Freq: Every day | ORAL | Status: DC | PRN

## 2015-05-05 MED ORDER — TOPIRAMATE 25 MG PO TABS
150.0000 mg | ORAL_TABLET | Freq: Every day | ORAL | Status: DC
  Administered 2015-05-06 – 2015-05-10 (×5): 150 mg via ORAL
  Filled 2015-05-05 (×8): qty 2

## 2015-05-05 MED ORDER — HEPARIN SODIUM (PORCINE) 10000 UNIT/ML IJ SOLN
10000.0000 [IU] | Freq: Every day | INTRAMUSCULAR | Status: DC
  Administered 2015-05-06: 10000 [IU] via SUBCUTANEOUS

## 2015-05-05 MED ORDER — PANTOPRAZOLE SODIUM 40 MG PO TBEC
40.0000 mg | DELAYED_RELEASE_TABLET | Freq: Every day | ORAL | Status: DC
  Administered 2015-05-05: 40 mg via ORAL
  Filled 2015-05-05: qty 1

## 2015-05-05 MED ORDER — TRAZODONE HCL 100 MG PO TABS: 200.0000 mg | ORAL_TABLET | Freq: Every day | ORAL | Status: DC

## 2015-05-05 MED ORDER — CLONAZEPAM 1 MG PO TABS
2.0000 mg | ORAL_TABLET | Freq: Every day | ORAL | Status: DC
  Administered 2015-05-06: 2 mg via ORAL
  Filled 2015-05-05: qty 2

## 2015-05-05 MED ORDER — LORAZEPAM 1 MG PO TABS
1.0000 mg | ORAL_TABLET | Freq: Once | ORAL | Status: AC
  Administered 2015-05-05: 1 mg via ORAL
  Filled 2015-05-05: qty 1

## 2015-05-05 MED ORDER — PANTOPRAZOLE SODIUM 40 MG PO TBEC
40.0000 mg | DELAYED_RELEASE_TABLET | Freq: Every day | ORAL | Status: DC
  Administered 2015-05-06 – 2015-05-10 (×5): 40 mg via ORAL
  Filled 2015-05-05 (×8): qty 1

## 2015-05-05 MED ORDER — ACETAMINOPHEN 325 MG PO TABS
650.0000 mg | ORAL_TABLET | ORAL | Status: DC | PRN
  Administered 2015-05-05: 650 mg via ORAL
  Filled 2015-05-05: qty 2

## 2015-05-05 MED ORDER — IBUPROFEN 200 MG PO TABS: 600.0000 mg | ORAL_TABLET | Freq: Three times a day (TID) | ORAL | Status: DC | PRN

## 2015-05-05 MED ORDER — PANTOPRAZOLE SODIUM 40 MG PO TBEC
40.0000 mg | DELAYED_RELEASE_TABLET | Freq: Every day | ORAL | Status: DC
  Filled 2015-05-05: qty 1

## 2015-05-05 MED ORDER — ONDANSETRON HCL 4 MG PO TABS: 4.0000 mg | ORAL_TABLET | Freq: Three times a day (TID) | ORAL | Status: DC | PRN

## 2015-05-05 MED ORDER — MONTELUKAST SODIUM 10 MG PO TABS
10.0000 mg | ORAL_TABLET | Freq: Every day | ORAL | Status: DC
  Filled 2015-05-05: qty 1

## 2015-05-05 NOTE — ED Notes (Signed)
rn explained to pt had to change into paper scrubs. Mother here to visit pt. All belongings given to mother and mother took belongings to car.    this included all pts jewelry has been given to mother. Pt reports was wearing a "$10,000 ring".

## 2015-05-05 NOTE — ED Notes (Signed)
Pt said that she could not

## 2015-05-05 NOTE — BH Assessment (Signed)
Assessment Note  Erika Mccoy is an 50 y.o. female presenting to [facility] via EMS. Patient contacting her mother today screaming and crying after a episode of cutting her wrist. Patients mother called 911. Patient is suicidal stating, "I will kill myself if I am sent home". She sat in the bath tub today with her a razor and cut her wrist. Patient has a superficial cut on her left wrist. She describes her self as feeling emotionless as she laid in the tub cutting herself. 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 She typically cuts herself on the ankles. Patients episodes of cutting has increased in the past month. She reports previously cutting 1-2x's per month and now she is cutting several times per week.  Patient has never cut her wrist until today. Patient has attempted suicide 1x in the past by OD (80 Tramadal). She was hospitalized for this suicide attempt. The suicide attempt in the past was triggered by a break up with her boyfriend of 5 yrs. Patient recently married as of June 2016. Sts that marriage has been difficult due to differences of personalities. Patient and spouse back and forth from [place] and this has caused patient a great deal of stress. She wants to restart medications stating, "I know taking psychotropics will take away my suicidal thoughts but I don't like how they make me feel". She has tried Abilify in the past and sts it worked for her. She has also tried Seroquel and Prolixin but didn't like those particular medications. She has a outpatient therapist-Andrew Michum at [facility]. Patient's psychiatrist is Wellington Hampshire, NP. Patient describes feeling increasing depressed, angry, and easily frustrated over the past several weeks. She describes herself as "crappy".   Patient denies current HI. She is calm and cooperative. Patient displays symptoms and mania. She has rapid and pressured speech. Her memory is recent and remotely intact. She She  denies having any current or hx of legal issues.   Patient denies AVH's. However, describes herself as having consistent "out of body experiences".   Patient reports alcohol and drug use. She drinks wine up to 3x's per week. She also uses THC frequently.   Patient has a strong family history of Bipolar Disorder (paternal). She denies history of sexually, physically, and emotional abuse.   *Patient sts that she "Caths herself daily". Writer made AC-Eric and Inetta Fermo aware of patient's medical needs. Patient will contact her spouse and ask him to bring her supplies. Patient however sts that her spouse may not be able to bring supplies; [facility] aware. Patient is still ok to come to [facility] but will have to use the supplies provided by [facility].   Diagnosis: Bipolar Disorder and Anxiety Disorder  Past Medical History:  Past Medical History  Diagnosis Date  . Interstitial cystitis   . Fibromyalgia   . Anxiety   . Lichen sclerosus of female genitalia   . Manic depression (HCC)   . HSV-2 infection     rare occurence  . MS (multiple sclerosis) (HCC)   . IBS (irritable bowel syndrome)   . History of self mutilation   . Arm sprain 5/11    right   . Lichen sclerosus     Vulva  . STD (sexually transmitted disease) 1989    Hx of HSV II, genital warts  . Genital warts   . Abnormal Pap smear of cervix 1997    --hx of conization of cervix by Dr. Roberto Scales  .  Broken arm     left arm by elbow    Past Surgical History  Procedure Laterality Date  . Endometrial ablation  1997/1998  . Breast enhancement surgery  1990  . Cervix lesion destruction  1997    Dr. Roberto Scales  . Tonsillectomy    . Nasal sinus surgery      Family History:  Family History  Problem Relation Age of Onset  . Hypertension Mother   . Hyperlipidemia Mother   . Lung cancer Mother   . Thyroid cancer Mother   . COPD Mother   . Lung cancer Father 59  . Heart attack Father 50    3 vessel CABG  . Leukemia Maternal Grandmother   . Stroke  Maternal Grandfather   . Thyroid disease Maternal Aunt   . Stroke Paternal Grandmother   . Heart failure Paternal Grandfather   . Osteoarthritis Maternal Aunt     Social History:  reports that she has never smoked. She has never used smokeless tobacco. She reports that she drinks about 0.6 oz of alcohol per week. She reports that she uses illicit drugs (Marijuana) about once per week.  Additional Social History:  Alcohol / Drug Use Pain Medications: SEE MAR Prescriptions: SEE MAR Over the Counter: SEE MAR History of alcohol / drug use?: Yes Substance #1 Name of Substance 1: Alcohol  1 - Age of First Use: 49 yrs old  1 - Amount (size/oz): 1-2  glassess of wine at night 1 - Frequency: 3 nights per week  1 - Duration: on-going  1 - Last Use / Amount: 3 days ago  Substance #2 Name of Substance 2: THC 2 - Age of First Use: 49 yrs old 2 - Amount (size/oz): varies 2 - Frequency: varies 2 - Duration: on-going  2 - Last Use / Amount: today at 10am  CIWA: CIWA-Ar BP: 111/69 mmHg Pulse Rate: 76 COWS:    Allergies:  Allergies  Allergen Reactions  . Cefzil [Cefprozil] Hives  . Ciprofloxacin     Small vessel vasculitis Unknown (Vasculitis)  . Lidocaine     Ointment caused burning.  . Monistat [Miconazole] Other (See Comments)    Reaction unknown  . Neosporin [Neomycin-Bacitracin Zn-Polymyx] Swelling    Hot  . Quinolones     Vasculitis with Levaquin and Cipro  . Septra [Sulfamethoxazole-Trimethoprim] Hives  . Valtrex [Valacyclovir Hcl]     Vomiting, diarrhea, and abdominal cramping.   . Adhesive [Tape] Rash and Other (See Comments)    Pulls skin off    Home Medications:  (Not in a hospital admission)  OB/GYN Status:  Patient's last menstrual period was 04/12/2015 (exact date).  General Assessment Data Location of Assessment: [facility] TTS Assessment: In system Is this a Tele or Face-to-Face Assessment?: Face-to-Face Is this an Initial Assessment or a Re-assessment for  this encounter?: Initial Assessment Marital status: Married [place] name:  (unk) Is patient pregnant?: No Pregnancy Status: No Living Arrangements: Other (Comment), Spouse/significant other (lives in household with spouse) Can pt return to current living arrangement?: Yes Admission Status: Voluntary Is patient capable of signing voluntary admission?: Yes Referral Source: Self/Family/Friend Insurance type:  Herbalist)  Medical Screening Exam [facility] Walk-in ONLY) Medical Exam completed: No  Crisis Care Plan Living Arrangements: Other (Comment), Spouse/significant other (lives in household with spouse) Legal Guardian:  (patient denies ) Name of Psychiatrist:  Chancy Hurter, NP) Name of Therapist:  Linus Mako)  Education Status Is patient currently in school?: No Current Grade:  (n/a) Highest grade  of school patient has completed:  (n/a) Name of school:  (n/a) Contact person:  (n/a)  Risk to self with the past 6 months Suicidal Ideation: Yes-Currently Present Has patient been a risk to self within the past 6 months prior to admission? : Yes Suicidal Intent: Yes-Currently Present Has patient had any suicidal intent within the past 6 months prior to admission? : Yes Is patient at risk for suicide?: Yes Suicidal Plan?: Yes-Currently Present (cut wrist or OD) Has patient had any suicidal plan within the past 6 months prior to admission? : Yes Specify Current Suicidal Plan:  (cut wrist or OD) Access to Means: Yes Specify Access to Suicidal Means:  (access to meds and sharp object such as razor or kitchen kni) What has been your use of drugs/alcohol within the last 12 months?:  (patient reports THC and alcohol use ) Previous Attempts/Gestures: Yes (OD-80 Tramadol) How many times?:  (1x- OD) Other Self Harm Risks:  (cutting; typically cuts ankle; today cut wrist) Triggers for Past Attempts: Other (Comment) (Bipolar, Manic Episode, Break with BF of 5 yrs ) Intentional Self Injurious  Behavior: Cutting Comment - Self Injurious Behavior:  (cutting: ankles typically; today cut wrist ) Family Suicide History: Yes (Paternal-Bipolar ) Recent stressful life event(s): Other (Comment) (new marriage (married June 2016); living in [place]) Persecutory voices/beliefs?: No Depression: Yes Depression Symptoms: Feeling angry/irritable, Feeling worthless/self pity, Loss of interest in usual pleasures, Guilt, Fatigue, Isolating, Tearfulness, Insomnia, Despondent Substance abuse history and/or treatment for substance abuse?: Yes (Pt reports using THC for pain.) Suicide prevention information given to non-admitted patients: Not applicable  Risk to Others within the past 6 months Homicidal Ideation: No Does patient have any lifetime risk of violence toward others beyond the six months prior to admission? : No Thoughts of Harm to Others: No Current Homicidal Intent: No Current Homicidal Plan: No Access to Homicidal Means: No Identified Victim:  (n/a) History of harm to others?: No Assessment of Violence: None Noted Violent Behavior Description:  (patient is calm and cooperative ) Does patient have access to weapons?: Yes (Comment) (sharp objects used for cutting self ) Criminal Charges Pending?: No Does patient have a court date: No Is patient on probation?: No  Psychosis Hallucinations: None noted Delusions: None noted (Denies but felt she was having a out of body experience toda)  Mental Status Report Appearance/Hygiene: In scrubs Eye Contact: Good Motor Activity: Freedom of movement Speech: Pressured, Rapid Level of Consciousness: Alert Mood: Anxious, Depressed Affect: Anxious Anxiety Level: Severe Thought Processes: Relevant Judgement: Impaired Orientation: Person, Time, Situation, Place Obsessive Compulsive Thoughts/Behaviors: None  Cognitive Functioning Concentration: Decreased Memory: Recent Intact, Remote Intact IQ: Average Insight: Poor Impulse  Control: Poor Appetite: Fair Weight Loss:  (patient has loss 40 pounds in 1 yr ) Weight Gain:  (none reported) Sleep: Decreased Total Hours of Sleep:  (varies; patient has difficulty sleeping most days) Vegetative Symptoms: None  ADLScreening [facility] Assessment Services) Patient's cognitive ability adequate to safely complete daily activities?: Yes Patient able to express need for assistance with ADLs?: Yes Independently performs ADLs?: Yes (appropriate for developmental age)  Prior Inpatient Therapy Prior Inpatient Therapy: Yes Prior Therapy Dates:  [facility]) Prior Therapy Facilty/Provider(s):  ("Yrs ago"; pt does not recall specific date) Reason for Treatment:  (OD)  Prior Outpatient Therapy Prior Outpatient Therapy: Yes Prior Therapy Dates:  (current) Prior Therapy Facilty/Provider(s):  ([facility]- Linus Mako (therapist); Wellington Hampshire) Reason for Treatment:  (therapy and med managment ) Does patient have  an ACCT team?: No Does patient have Intensive In-House Services?  : No Does patient have [facility] services? : No Does patient have P4CC services?: No  ADL Screening (condition at time of admission) Patient's cognitive ability adequate to safely complete daily activities?: Yes Is the patient deaf or have difficulty hearing?: No Does the patient have difficulty seeing, even when wearing glasses/contacts?: No Does the patient have difficulty concentrating, remembering, or making decisions?: No Patient able to express need for assistance with ADLs?: Yes Does the patient have difficulty dressing or bathing?: No Independently performs ADLs?: Yes (appropriate for developmental age) Does the patient have difficulty walking or climbing stairs?: No Weakness of Legs: None Weakness of Arms/Hands: None       Abuse/Neglect Assessment (Assessment to be complete while patient is alone) Physical Abuse: Denies Verbal Abuse: Denies Sexual Abuse: Denies Exploitation of  patient/patient's resources: Denies Self-Neglect: Denies Values / Beliefs Cultural Requests During Hospitalization: None Spiritual Requests During Hospitalization: None   Advance Directives (For Healthcare) Does patient have an advance directive?: No    Additional Information 1:1 In Past 12 Months?: No CIRT Risk: No Elopement Risk: No Does patient have medical clearance?: Yes     Disposition:  Disposition Initial Assessment Completed for this Encounter: Yes Disposition of Patient: Inpatient treatment program Julieanne Cotton, NP recommends inpatient treatment ) Type of inpatient treatment program: Adult (Patient accepted to room 402-2 at [facility] adult unit)  On Site Evaluation by:   Reviewed with Physician:    Melynda [facility] 05/05/2015 6:42 PM

## 2015-05-05 NOTE — Progress Notes (Signed)
Skin assessment preformed by this Clinical research associate. Patient  Skin is clean and dry. Patient has multiple tattoos over body. Areas of concern, patient has a skin abrasion from previous self harm on left forearm, no drainage, or redness, area is 1.5 inches long. On patient left anterior ankle abrasion clean and dry no drainage, area is 1.5 inches long from pervious self harm before admission.

## 2015-05-05 NOTE — ED Notes (Addendum)
Per ems pt is from home, pt self inflicted superficial left wrist laceration, bleeding controlled.  Pt was crying on the phone to her mother. And then mother called 911.   When [facility] arrived, pt was completely naked and sobbing.   Upon rn assessment, pt reports hx of depression, is on medications, but is a cutter. Pt was in bathtub and cut her left wrist. Pt reports that she felt like she was "watching herself" but not really there. Says she has had 1 episode like this in the past.   Mother reports that pt told her she would do this again if given the chance.

## 2015-05-05 NOTE — ED Provider Notes (Signed)
[name]: [id number]     Arrival date & time 05/05/15  1539 History   First MD Initiated Contact with Patient 05/05/15 1707     Chief Complaint  Patient presents with  . Suicidal  . Laceration       (Consider location/radiation/quality/duration/timing/severity/associated sxs/prior Treatment) Patient is a 49 y.o. female presenting with depression. The history is provided by the patient and a parent. No language interpreter was used.  Depression This is a new problem. The current episode started today. The problem occurs constantly. The problem has been gradually worsening. Pertinent negatives include no vomiting or weakness. Nothing aggravates the symptoms. She has tried nothing for the symptoms. The treatment provided no relief.  Pt complains of agittation and depression.  Pt here with Mother.  Pt cut her wrist today.  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  Pt wants electroshock therapy  Past Medical History  Diagnosis Date  . Interstitial cystitis   . Fibromyalgia   . Anxiety   . Lichen sclerosus of female genitalia   . Manic depression (HCC)   . HSV-2 infection     rare occurence  . MS (multiple sclerosis) (HCC)   . IBS (irritable bowel syndrome)   . History of self mutilation   . Arm sprain 5/11    right   . Lichen sclerosus     Vulva  . STD (sexually transmitted disease) 1989    Hx of HSV II, genital warts  . Genital warts   . Abnormal Pap smear of cervix 1997    --hx of conization of cervix by Dr. Roberto Scales  . Broken arm     left arm by elbow   Past Surgical History  Procedure Laterality Date  . Endometrial ablation  1997/1998  . Breast enhancement surgery  1990  . Cervix lesion destruction  1997    Dr. Roberto Scales  . Tonsillectomy    . Nasal sinus surgery     Family History  Problem Relation Age of Onset  . Hypertension Mother   . Hyperlipidemia Mother   . Lung cancer Mother   . Thyroid cancer Mother   . COPD Mother   . Lung cancer Father 63  . Heart attack Father 50    3  vessel CABG  . Leukemia Maternal Grandmother   . Stroke Maternal Grandfather   . Thyroid disease Maternal Aunt   . Stroke Paternal Grandmother   . Heart failure Paternal Grandfather   . Osteoarthritis Maternal Aunt    Social History  Substance Use Topics  . Smoking status: Never Smoker   . Smokeless tobacco: Never Used  . Alcohol Use: 0.6 oz/week    1 Standard drinks or equivalent per week   OB History    Gravida Para Term Preterm AB TAB SAB Ectopic Multiple Living   Review of Systems  Gastrointestinal: Negative for vomiting.  Neurological: Negative for weakness.  Psychiatric/Behavioral: Positive for depression.  All other systems reviewed and are negative.     Allergies  Cefzil; Ciprofloxacin; Lidocaine; Monistat; Neosporin; Quinolones; Septra; Valtrex; and Adhesive  Home Medications   Prior to Admission medications   Medication Sig Start Date End Date Taking? Authorizing Provider  acetaminophen-codeine (TYLENOL #3) 300-30 MG tablet Take 1 tablet by mouth 2 (two) times daily. 04/10/15  Yes Historical Provider, MD  bupivacaine (MARCAINE) 0.5 % injection Instill 15 cc into bladder daily 03/19/15  Yes Historical Provider, MD  clobetasol ointment (  TEMOVATE) 0.05 % Apply 1 application topically 2 (two) times daily. 01/08/15  Yes Verner Chol, CNM  clonazePAM (KLONOPIN) 2 MG tablet Take 1 tablet (2 mg total) by mouth daily. Patient taking differently: Take 2 mg by mouth at bedtime.  01/20/14  Yes Charm Rings, NP  heparin 45409 UNIT/ML injection Instill 4 cc into bladder daily 03/19/15  Yes Historical Provider, MD  hydrOXYzine (ATARAX/VISTARIL) 25 MG tablet Take 50 mg by mouth at bedtime. Takes 2 tablets daily 03/19/15  Yes Historical Provider, MD  lamoTRIgine (LAMICTAL) 200 MG tablet Take 200 mg by mouth at bedtime.    Yes Historical Provider, MD  metroNIDAZOLE (FLAGYL) 500 MG tablet Take 1 tablet (500 mg total) by mouth 2 (two) times daily. 05/01/15  Yes  Verita Schneiders Key, NP  metroNIDAZOLE (METROGEL) 0.75 % gel Apply 1 application topically 2 (two) times daily. 04/20/15  Yes Historical Provider, MD  montelukast (SINGULAIR) 10 MG tablet Take 1 tablet (10 mg total) by mouth every morning. 01/20/14  Yes Charm Rings, NP  omeprazole (PRILOSEC) 40 MG capsule Take 40 mg by mouth daily.   Yes Historical Provider, MD  polyethylene glycol (MIRALAX / GLYCOLAX) packet Take 17 g by mouth daily as needed for mild constipation.   Yes Historical Provider, MD  Probiotic Product (PROBIOTIC PO) Take 1 tablet by mouth daily.   Yes Historical Provider, MD  promethazine (PHENERGAN) 25 MG tablet Take 25 mg by mouth every 6 (six) hours as needed for nausea or vomiting.  03/19/15  Yes Historical Provider, MD  QVAR 40 MCG/ACT inhaler Inhale 2 puffs into the lungs 2 (two) times daily.  03/14/15  Yes Historical Provider, MD  topiramate (TOPAMAX) 50 MG tablet Take 150 mg by mouth daily.  07/01/14  Yes Historical Provider, MD  traZODone (DESYREL) 100 MG tablet Take 2 tablets (200 mg total) by mouth at bedtime. 01/20/14  Yes Charm Rings, NP   BP 115/75 mmHg  Pulse 93  Temp(Src) 98.4 F (36.9 C) (Oral)  Resp 20  SpO2 100%  LMP 04/12/2015 (Exact Date) Physical Exam  Constitutional: She is oriented to person, place, and time. She appears well-developed and well-nourished.  HENT:  Head: Normocephalic and atraumatic.  Right Ear: External ear normal.  Left Ear: External ear normal.  Nose: Nose normal.  Mouth/Throat: Oropharynx is clear and moist.  Eyes: EOM are normal. Pupils are equal, round, and reactive to light.  Neck: Normal range of motion.  Cardiovascular: Normal rate and normal heart sounds.   Pulmonary/Chest: Effort normal.  Abdominal: Soft. She exhibits no distension.  Musculoskeletal: Normal range of motion.  Neurological: She is alert and oriented to person, place, and time.  Skin: Skin is warm.  Superficial abrasion left wrist  Psychiatric: She has a normal  mood and affect.  Nursing note and vitals reviewed.   ED Course  Procedures (including critical care time) Labs Review Labs Reviewed  COMPREHENSIVE METABOLIC PANEL - Abnormal; Notable for the following:    CO2 20 (*)    ALT 11 (*)    All other components within normal limits  ACETAMINOPHEN LEVEL - Abnormal; Notable for the following:    Acetaminophen (Tylenol), Serum <10 (*)    All other components within normal limits  URINE RAPID DRUG SCREEN, HOSP PERFORMED - Abnormal; Notable for the following:    Opiates POSITIVE (*)    Benzodiazepines POSITIVE (*)    All other components within normal limits  ETHANOL  SALICYLATE LEVEL  CBC  Imaging Review No results found. I have personally reviewed and evaluated these images and lab results as part of my medical decision-making.   EKG Interpretation None      MDM   Final diagnoses:  Depression    TTS consult    Elson Areas, PA-C 05/05/15 1827  Laurence Spates, MD 05/07/15 [phone]

## 2015-05-05 NOTE — ED Notes (Signed)
Put patient jewelry, clothing, license, and urine collection cup in belongings bag and gave to patient's mother.

## 2015-05-05 NOTE — BH Assessment (Addendum)
Patient accepted to [facility] by Jossephine, NP. Patient's bed assignment is 402-2. Patient will transfer to [facility] via [facility] after shift change. Patient completed support paperwork.

## 2015-05-05 NOTE — ED Notes (Signed)
Erika Mccoy (mother) [phone]

## 2015-05-05 NOTE — ED Notes (Signed)
Pt transported to [facility] by [facility] transportation service for continuation of specialized care. Patient had no belongings and patient signed to that fact. Pt left in no acute distress.

## 2015-05-06 ENCOUNTER — Telehealth: Payer: Self-pay | Admitting: Obstetrics and Gynecology

## 2015-05-06 DIAGNOSIS — F3110 Bipolar disorder, current episode manic without psychotic features, unspecified: Principal | ICD-10-CM

## 2015-05-06 LAB — GLUCOSE, CAPILLARY: GLUCOSE-CAPILLARY: 94 mg/dL (ref 65–99)

## 2015-05-06 MED ORDER — FLUPHENAZINE HCL 5 MG PO TABS
5.0000 mg | ORAL_TABLET | Freq: Two times a day (BID) | ORAL | Status: DC
  Administered 2015-05-06 – 2015-05-10 (×8): 5 mg via ORAL
  Filled 2015-05-06 (×2): qty 4
  Filled 2015-05-06: qty 1
  Filled 2015-05-06: qty 4
  Filled 2015-05-06 (×4): qty 1
  Filled 2015-05-06: qty 4
  Filled 2015-05-06 (×5): qty 1

## 2015-05-06 MED ORDER — TRAMADOL HCL 50 MG PO TABS
50.0000 mg | ORAL_TABLET | Freq: Three times a day (TID) | ORAL | Status: DC | PRN
  Administered 2015-05-06 – 2015-05-10 (×4): 50 mg via ORAL
  Filled 2015-05-06 (×4): qty 1

## 2015-05-06 MED ORDER — HEPARIN SODIUM (PORCINE) 10000 UNIT/ML IJ SOLN: 40000.0000 [IU] | Freq: Every day | INTRAMUSCULAR | Status: DC

## 2015-05-06 MED ORDER — CLONAZEPAM 1 MG PO TABS
1.0000 mg | ORAL_TABLET | Freq: Two times a day (BID) | ORAL | Status: DC
  Administered 2015-05-06 – 2015-05-10 (×8): 1 mg via ORAL
  Filled 2015-05-06 (×8): qty 1

## 2015-05-06 MED ORDER — LAMOTRIGINE 25 MG PO TABS
250.0000 mg | ORAL_TABLET | Freq: Every day | ORAL | Status: DC
  Administered 2015-05-06 – 2015-05-08 (×3): 250 mg via ORAL
  Filled 2015-05-06 (×5): qty 2

## 2015-05-06 MED ORDER — HEPARIN SODIUM (PORCINE) 10000 UNIT/ML IJ SOLN
40000.0000 [IU] | Freq: Every day | INTRAMUSCULAR | Status: DC | PRN
Start: 2015-05-06 — End: 2015-05-11

## 2015-05-06 MED ORDER — HEPARIN SODIUM (PORCINE) 10000 UNIT/ML IJ SOLN: 10000.0000 [IU] | Freq: Every day | INTRAMUSCULAR | Status: DC

## 2015-05-06 MED ORDER — HEPARIN SODIUM (PORCINE) 10000 UNIT/ML IJ SOLN
10000.0000 [IU] | Freq: Every day | INTRAMUSCULAR | Status: DC
Start: 2015-05-06 — End: 2015-05-06

## 2015-05-06 MED ORDER — HEPARIN SODIUM (PORCINE) 10000 UNIT/ML IJ SOLN: 10000.0000 [IU] | Freq: Every day | INTRAMUSCULAR | Status: DC | PRN

## 2015-05-06 MED ORDER — BUPIVACAINE HCL (PF) 0.5 % IJ SOLN
25.0000 mL | Freq: Every day | INTRAMUSCULAR | Status: DC | PRN
  Filled 2015-05-06 (×2): qty 30

## 2015-05-06 NOTE — Progress Notes (Signed)
D-  Patient has had complaints of pain due to bladder retention.  Patient was able to self cath this shift but it was unsuccessful due to having the wrong size catheter.  Patient reports her depression has been increasing because of her pain.  Patient denies SI, HI and AVH  A-  Assess patient for safety, offer medications as prescribed, engage patient in 1:1 therapeutic talks.   R-  Patient able to contract for safety.

## 2015-05-06 NOTE — Telephone Encounter (Addendum)
Patient calling to request results. °

## 2015-05-06 NOTE — Progress Notes (Signed)
Patient ID: Erika Mccoy, female   DOB: 1965/08/11, 49 y.o.   MRN: [id number]  Writer assumed care of this patient at 1300. Patient was asked about her Heparin dosage and catheters. She reports her husband should be bringing the catheters from home that are the right size. Patient became tearful and states, "I'm hot." Patient reports that her physical health and mental health is suffering. Writer offered encouragement and support. Patient reports, "I'm going to empty my bladder." She is able to empty her bladder without a catheter but she reports that she does self cath in order to fully empty her bladder. Patient received PRN Tramadol for bladder pain. Q15 minute safety checks are maintained.

## 2015-05-06 NOTE — [facility] Group Notes (Signed)
[facility] LCSW Group Therapy 05/06/2015 1:15 PM  Type of Therapy: Group Therapy- Emotion Regulation  Participation Level: Active   Participation Quality:  Appropriate  Affect: Appropriate  Cognitive: Alert and Oriented   Insight:  Developing/Improving  Engagement in Therapy: Developing/Improving and Engaged   Modes of Intervention: Clarification, Confrontation, Discussion, Education, Exploration, Limit-setting, Orientation, Problem-solving, Rapport Building, Dance movement psychotherapist, Socialization and Support  Summary of Progress/Problems: The topic for group today was emotional regulation. This group focused on both positive and negative emotion identification and allowed group members to process ways to identify feelings, regulate negative emotions, and find healthy ways to manage internal/external emotions. Group members were asked to reflect on a time when their reaction to an emotion led to a negative outcome and explored how alternative responses using emotion regulation would have benefited them. Group members were also asked to discuss a time when emotion regulation was utilized when a negative emotion was experienced. Pt discussed her frustration with regulating her unexpected sadness and crying spells. Pt was open to suggestions from peers and CSW. She also expressed that she "doesn't have any emotion regulation. Per Pt, she feels that she has struggled to find coping skills that effectively manage her depression.   Chad Cordial, LCSWA 05/06/2015 3:49 PM

## 2015-05-06 NOTE — Telephone Encounter (Signed)
Spoke with patient at time of incoming call. Patient states she does not have access to mychart. Advised of message and results as seen below from Dr.Silva. Patient is agreeable and verbalizes understanding.  Routing to provider for final review. Patient agreeable to disposition. Will close encounter.

## 2015-05-06 NOTE — Progress Notes (Signed)
Pt came to the nursing station demanding to sign a 72 hr request for D/C. Pt was informed to talk to the doctor in the morning and if she still wanted to sign she could do it in the morning. " I know how this works, I don't want to be here longer than I need to be." Pt was informed that she just stated if she went home she would hurt herself, and to just get some rest and talk it over with the doctor. Pt stormed of from the nursing station " this won't happen next time, no this will never happen again because I'll be successful next time" .

## 2015-05-06 NOTE — Plan of Care (Signed)
Problem: Ineffective individual coping Goal: STG: Patient will remain free from self harm Outcome: Progressing Pt safe on the unit at this time     

## 2015-05-06 NOTE — Progress Notes (Signed)
Pt had to come back from the line while going to breakfast. Pt hysterical , crying , presenting with histrionic behaviors

## 2015-05-06 NOTE — Progress Notes (Signed)
Adult Psychoeducational Group Note  Date:  05/06/2015 Time:  9:42 PM  Group Topic/Focus:  Wrap-Up Group:   The focus of this group is to help patients review their daily goal of treatment and discuss progress on daily workbooks.  Participation Level:  Active  Participation Quality:  Appropriate  Affect:  Appropriate  Cognitive:  Alert  Insight: Appropriate  Engagement in Group:  Engaged  Modes of Intervention:  Discussion  Additional Comments:  Pt stated that she had a good day. She was having some pain issues.   Kaleen Odea R 05/06/2015, 9:42 PM

## 2015-05-06 NOTE — Tx Team (Signed)
Interdisciplinary Treatment Plan Update (Adult) Date: 05/06/2015   Date: 05/06/2015 8:53 AM  Progress in Treatment:  Attending groups: Yes  Participating in groups: Yes  Taking medication as prescribed: Yes  Tolerating medication: Yes  Family/Significant othe contact made: No, CSW assessing for appropriate contact Patient understands diagnosis: Continuing to assess Discussing patient identified problems/goals with staff: Yes  Medical problems stabilized or resolved: Yes  Denies suicidal/homicidal ideation: No, Pt recently admitted with SI Patient has not harmed self or Others: Yes   New problem(s) identified: None identified at this time.   Discharge Plan or Barriers: Pt will discharge home and follow-up with established outpatient providers  Additional comments:  Patient and CSW reviewed pt's identified goals and treatment plan. Patient verbalized understanding and agreed to treatment plan. CSW reviewed [facility] "Discharge Process and Patient Involvement" Form. Pt verbalized understanding of information provided and signed form.   Reason for Continuation of Hospitalization:  Anxiety Depression Medication stabilization Suicidal ideation  Estimated length of stay: 3-5 days  Review of initial/current patient goals per problem list:   1.  Goal(s): Patient will participate in aftercare plan  Met:  Yes  Target date: 3-5 days from date of admission   As evidenced by: Patient will participate within aftercare plan AEB aftercare provider and housing plan at discharge being identified.   05/06/15: Pt will DC home and follow-up with outpatient resources  2.  Goal (s): Patient will exhibit decreased depressive symptoms and suicidal ideations.  Met:  No  Target date: 3-5 days from date of admission   As evidenced by: Patient will utilize self rating of depression at 3 or below and demonstrate decreased signs of depression or be deemed stable for discharge by MD. 05/06/15: Pt was  admitted with symptoms of depression, rating 10/10. Pt continues to present with flat affect and depressive symptoms.  Pt will demonstrate decreased symptoms of depression and rate depression at 3/10 or lower prior to discharge.  3.  Goal(s): Patient will demonstrate decreased signs and symptoms of anxiety.  Met:  No  Target date: 3-5 days from date of admission   As evidenced by: Patient will utilize self rating of anxiety at 3 or below and demonstrated decreased signs of anxiety, or be deemed stable for discharge by MD 05/06/15: Pt was admitted with increased levels of anxiety and is currently rating those symptoms highly. Pt will demonstrated decreased symptoms of anxiety and rate it at 3/10 prior to d/c. Attendees:  Patient:    Family:    Physician: Dr. Cobos, MD  05/06/2015 8:53 AM  Nursing: Jennifer Clark, RN Case manager  05/06/2015 8:53 AM  Clinical Social Worker  Carter, LCSWA 05/06/2015 8:53 AM  Other: Kristin Drinkard, LCSWA 05/06/2015 8:53 AM  Clinical:  Sara Twyman, RN 05/06/2015 8:53 AM  Other: , RN Charge Nurse 05/06/2015 8:53 AM  Other: Dolora Sutton, P4CC     Carter, LCSWA Clinical Social Work [phone]      

## 2015-05-06 NOTE — Tx Team (Signed)
Initial Interdisciplinary Treatment Plan   PATIENT STRESSORS: Financial difficulties Health problems Marital or family conflict Medication change or noncompliance   PATIENT STRENGTHS: Ability for insight Financial means General fund of knowledge Supportive family/friends   PROBLEM LIST: Problem List/Patient Goals Date to be addressed Date deferred Reason deferred Estimated date of resolution  SI 05/06/15     "medication adjustment" 05/06/15                                                DISCHARGE CRITERIA:  Improved stabilization in mood, thinking, and/or behavior Verbal commitment to aftercare and medication compliance  PRELIMINARY DISCHARGE PLAN: Outpatient therapy  PATIENT/FAMIILY INVOLVEMENT: This treatment plan has been presented to and reviewed with the patient, Erika Mccoy.  The patient and family have been given the opportunity to ask questions and make suggestions.  Jacques Navy A 05/06/2015, 12:37 AM

## 2015-05-06 NOTE — Progress Notes (Signed)
Recreation Therapy Notes  Date: 12.21.2016 Time: 9:30am Location: [address] Hall Group Room   Group Topic: Stress Management  Goal Area(s) Addresses:  Patient will actively participate in stress management techniques presented during session.   Behavioral Response: Appropriate, Engaged   Intervention: Stress management techniques  Activity :  Deep Breathing and Progressive Muscle Relaxation. LRT provided instruction and demonstration on practice of Progressive Muscle Relaxation. Technique was coupled with deep breathing.   Education:  Stress Management, Discharge Planning.   Education Outcome: Acknowledges education  Clinical Observations/Feedback: Patient actively engaged in technique introduced, expressed no concerns and demonstrated ability to practice independently post d/c.   [name], LRT/CTRS        [name] 05/06/2015 11:50 AM

## 2015-05-06 NOTE — Progress Notes (Signed)
D: Pt denies SI/HI/AVH. Pt is pleasant and cooperative. Pt stated she felt a little better today, pt interacting on the unit with peers. Pt appears less labile during interaction this evening.   A: Pt was offered support and encouragement. Pt was given scheduled medications. Pt was encourage to attend groups. Q 15 minute checks were done for safety.   R:Pt attends groups and interacts well with peers and staff. Pt is taking medication. Pt has no complaints at this time .Pt receptive to treatment and safety maintained on unit.

## 2015-05-06 NOTE — [facility] Suicide Risk Assessment (Signed)
[facility] Admission Suicide Risk Assessment   Nursing information obtained from:    Demographic factors:    Current Mental Status:    Loss Factors:    Historical Factors:    Risk Reduction Factors:    Total Time spent with patient: 45 minutes Principal Problem: Bipolar disorder, manic (HCC) Diagnosis:   Patient Active Problem List   Diagnosis Date Noted  . Bipolar disorder, manic (HCC) [F31.10] 05/05/2015  . Fothergill's neuralgia [G50.0] 01/15/2014  . Rotator cuff syndrome [M75.100] 07/15/2013  . ANA positive [R76.8] 03/12/2013  . PCB (post coital bleeding) [N93.0] 01/18/2013  . Lichen sclerosus [L90.0] 12/03/2012  . Irritable bowel syndrome [K58.9] 12/03/2012  . Interstitial cystitis [N30.10] 12/03/2012  . Hypertriglyceridemia [E78.1] 10/25/2011  . Allergic rhinitis, seasonal [J30.2] 09/28/2011  . Adhesions of vagina [N89.5] 08/22/2011  . 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 Acne [L70.9] 12/21/2010  . Bipolar 1 disorder (HCC) [F31.9] 12/09/2010  . 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 Fibrositis [M79.7] 12/09/2010  . Anxiety, generalized [F41.1] 12/09/2010  . 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  World Science writer [facility]). Score between 0-7:  no or low risk or alcohol related problems. Score between 8-15:  moderate risk of alcohol related problems. Score between 16-19:  high risk of alcohol related problems. Score 20 or above:  warrants further diagnostic evaluation for alcohol dependence and treatment.   CLINICAL FACTORS:   Depression:   Anhedonia Hopelessness Impulsivity Insomnia Recent sense of peace/wellbeing Severe   Musculoskeletal: Strength & Muscle Tone: within normal limits Gait & Station: normal Patient leans: N/A  Psychiatric  Specialty Exam: Physical Exam  Review of Systems  Skin:       Superficial marks on left wrist and left ankle  Psychiatric/Behavioral: Positive for depression and suicidal ideas. The patient is nervous/anxious and has insomnia.     Blood pressure 85/69, pulse 104, temperature 99.3 F (37.4 C), temperature source Oral, resp. rate 16, height  (1.626 m), weight 60.328 kg (133 lb), last menstrual period 04/12/2015.Body mass index is 22.82 kg/(m^2).  General Appearance: Fairly Groomed  Patent attorney::  Fair  Speech:  Slow  Volume:  Decreased  Mood:  Anxious, Depressed, Dysphoric and Hopeless  Affect:  Constricted and Depressed  Thought Process:  Coherent  Orientation:  Full (Time, Place, and Person)  Thought Content:  Rumination  Suicidal Thoughts:  Yes.  without intent/plan  Homicidal Thoughts:  No  Memory:  Immediate;   Fair Recent;   Fair Remote;   Fair  Judgement:  Fair  Insight:  Fair  Psychomotor Activity:  Decreased  Concentration:  Fair  Recall:  [employer] of Knowledge:Fair  Language: Fair  Akathisia:  No  Handed:  Right  AIMS (if indicated):     Assets:  Communication Skills Desire for Improvement Housing  Sleep:  Number of Hours: 6  Cognition: WNL  ADL's:  Intact     COGNITIVE FEATURES THAT CONTRIBUTE TO RISK:  Closed-mindedness, Polarized thinking and Thought constriction (tunnel vision)    SUICIDE RISK:   Mild:  Suicidal ideation of limited frequency, intensity, duration, and specificity.  There are no identifiable plans, no associated intent, mild dysphoria and related symptoms, good self-control (both objective and subjective assessment), few other risk factors, and identifiable protective factors, including available and accessible social support.  PLAN OF CARE: Patient  is 49 year old Caucasian female who was admitted after cutting her wrist and became very anxious.  Patient admitted she was having suicidal thoughts .  She usually cut her ankle but is the  first time she cut her wrist .  Patient has multiple diagnoses .  She has diagnosed with PTSD, disassociative disorder, bipolar disorder and major depression.  She admitted lately feeling very depressed, hopeless, worthless and anhedonia.  She feels her medicine is not working.  Patient requires inpatient treatment and stabilization.  Please see history and physical and complete treatment plan for more details.    Medical Decision Making:  New problem, with additional work up planned, Review of Psycho-Social Stressors (1), Review or order clinical lab tests (1), Decision to obtain old records (1), Review and summation of old records (2), Established Problem, Worsening (2), New Problem, with no additional work-up planned (3), Review of Medication Regimen & Side Effects (2) and Review of New Medication or Change in Dosage (2)  I certify that inpatient services furnished can reasonably be expected to improve the patient's condition.   Sandi Towe T. 05/06/2015, 12:08 PM

## 2015-05-06 NOTE — H&P (Signed)
Psychiatric Admission Assessment Adult  Patient Identification: Erika Mccoy  MRN:  [id number]  Date of Evaluation:  05/06/2015  Chief Complaint:  MDD  Principal Diagnosis: <principal problem not specified>  Diagnosis:   Patient Active Problem List   Diagnosis Date Noted  . Bipolar disorder, manic ([place]) [F31.10] 05/05/2015  . Fothergill's neuralgia [G50.0] 01/15/2014  . Rotator cuff syndrome [M75.100] 07/15/2013  . ANA positive [R76.8] 03/12/2013  . PCB (post coital bleeding) [N93.0] 01/18/2013  . Lichen sclerosus [R11.6] 12/03/2012  . Irritable bowel syndrome [K58.9] 12/03/2012  . Interstitial cystitis [N30.10] 12/03/2012  . Hypertriglyceridemia [E78.1] 10/25/2011  . Allergic rhinitis, seasonal [J30.2] 09/28/2011  . Adhesions of vagina [N89.5] 08/22/2011  . 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 Acne [L70.9] 12/21/2010  . Bipolar 1 disorder ([place]) [F31.9] 12/09/2010  . 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 Fibrositis [M79.7] 12/09/2010  . Anxiety, generalized [F41.1] 12/09/2010  . 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 Admitted to [facility] from the [facility] ED with complaints of worsening symptoms of Bipolar disorder leading to suicide attempt by wrist cutting. During this assessment, Erika Mccoy reports, "The ambulance took me to the ED yesterday. My mother called the Erika Mccoy. Yesterday, I felt very strange, decided to take my dog to my bathroom with my music. Undressed myself, calmly went into the bath tub, ran the water on my wrist, started cutting, but the knife was dull. I thought about getting a sharp knife, that was what brought me to reality again, I called my mother instead. My mental health is detoriating. I take my medications as prescribed/recommended. I see my therapist as scheduled. My primary psychiatrist has informed me that my manic depression is very atypical. The psychotropic  medications can only take away the mania, never the depression. During my manic phase; I have bout of stupidity, high energy, indiscretion with sex & highly creative. This has been on since 2004 when I was first diagnosed with Bipolar mania & was hospitalized in this hospital. I was once or twice on antipsychotics, felt better for several years, but gained a lot of weight that got me very depressed & uncomfortable with my body image. I developed eating disorder & lost a lot of weight. However, recently, I have been managing my wight by eating responsibly combined with portion control. I do believe that I need an antipsychotic medicine to help me stop cutting on on myself. This is the first time that I have tried to cut on my wrists. I usually cut on my ankle since 2004. I use to burn myself as well, but not of recent. I refuse to take Seroquel, Prolixin or any of them that will make me gain weight".  Objective: Erika Mccoy is alert, oriented x 4. Appears stated age. Erika Mccoy is verbally responsive, a good historian with poor eye contact. Erika Mccoy reports long history or mental illness, mania, reckless behavior, inappropriate relationship with deceased father at a young age. Erika Mccoy reports history of dissociative disorder whereby Erika Mccoy sees the real outside world & trees appear unreal, took on a plastic quality, people appear odd. Erika Mccoy felt like Erika Mccoy is not a part of the world or what is going on. Erika Mccoy feels separated from reality. Erika Mccoy says Erika Mccoy almost must have someone to bring her back to reality. Erika Mccoy has agreed on Haldol or any antipsychotic that will not make her gain weight.  Associated Signs/Symptoms:  Depression Symptoms:  depressed mood, psychomotor agitation, feelings  of worthlessness/guilt, difficulty concentrating, hopelessness, anxiety, disturbed sleep,  (Hypo) Manic Symptoms:  Distractibility, Flight of Ideas, Grandiosity, Impulsivity, Irritable Mood, Labiality of Mood, Sexually Inapproprite  Behavior,  Anxiety Symptoms:  Excessive Worry,  Psychotic Symptoms:  Denies any psychotic symptoms  PTSD Symptoms: Re-experiencing:  Flashbacks Intrusive Thoughts  Total Time spent with patient: 1 hour  Past Psychiatric History: Bipolar affective disorder  Risk to Self: Is patient at risk for suicide?: Yes (contract for safety) Risk to Others: No Prior Inpatient Therapy: Yes, ([place] in 2004, [facility] hospital) Prior Outpatient Therapy: Yes  Alcohol Screening: 1. How often do you have a drink containing alcohol?: 4 or more times a week 2. How many drinks containing alcohol do you have on a typical day when you are drinking?: 1 or 2 3. How often do you have six or more drinks on one occasion?: Never Preliminary Score: 0 9. Have you or someone else been injured as a result of your drinking?: No 10. Has a relative or friend or a doctor or another health worker been concerned about your drinking or suggested you cut down?: No Alcohol Use Disorder Identification Test Final Score (AUDIT): 4 Brief Intervention: AUDIT score less than 7 or less-screening does not suggest unhealthy drinking-brief intervention not indicated  Substance Abuse History in the last 12 months:  No.  Consequences of Substance Abuse: NA  Previous Psychotropic Medications: Yes   Psychological Evaluations: Yes   Past Medical History:  Past Medical History  Diagnosis Date  . Interstitial cystitis   . Fibromyalgia   . Anxiety   . Lichen sclerosus of female genitalia   . Manic depression ([place])   . HSV-2 infection     rare occurence  . MS (multiple sclerosis) ([place])   . IBS (irritable bowel syndrome)   . History of self mutilation   . Arm sprain 5/11    right   . Lichen sclerosus     Vulva  . STD (sexually transmitted disease) 1989    Hx of HSV II, genital warts  . Genital warts   . Abnormal Pap smear of cervix 1997    --hx of conization of cervix by Dr. Connye Burkitt  . Broken arm     left arm by elbow     Past Surgical History  Procedure Laterality Date  . Endometrial ablation  1997/1998  . Breast enhancement surgery  1990  . Cervix lesion destruction  1997    Dr. Connye Burkitt  . Tonsillectomy    . Nasal sinus surgery     Family History:  Family History  Problem Relation Age of Onset  . Hypertension Mother   . Hyperlipidemia Mother   . Lung cancer Mother   . Thyroid cancer Mother   . COPD Mother   . Lung cancer Father 65  . Heart attack Father 45    3 vessel CABG  . Leukemia Maternal Grandmother   . Stroke Maternal Grandfather   . Thyroid disease Maternal Aunt   . Stroke Paternal Grandmother   . Heart failure Paternal Grandfather   . Osteoarthritis Maternal Aunt    Family Psychiatric  History: Alcoholism: Father  Social History:  History  Alcohol Use  . 0.6 oz/week  . 1 Standard drinks or equivalent per week     History  Drug Use  . 1.00 per week  . Special: Marijuana    Social History   Social History  . Marital Status: Married    Spouse Name: N/A  . Number of Children:  N/A  . Years of Education: N/A   Social History Main Topics  . Smoking status: Never Smoker   . Smokeless tobacco: Never Used  . Alcohol Use: 0.6 oz/week    1 Standard drinks or equivalent per week  . Drug Use: 1.00 per week    Special: Marijuana  . Sexual Activity:    Partners: Male    Patent examiner Protection: Surgical     Comment: Ablation   Other Topics Concern  . None   Social History Narrative   Additional Social History:   Allergies:   Allergies  Allergen Reactions  . Cefzil [Cefprozil] Hives  . Ciprofloxacin Other (See Comments)    Small vessel vasculitis Unknown (Vasculitis)  . Lidocaine Other (See Comments)    Ointment caused burning.  . Monistat [Miconazole] Other (See Comments)    Reaction unknown  . Neosporin [Neomycin-Bacitracin Zn-Polymyx] Swelling    Hot  . Quinolones Other (See Comments)    Vasculitis with Levaquin and Cipro  . Septra  [Sulfamethoxazole-Trimethoprim] Hives  . Valtrex [Valacyclovir Hcl] Other (See Comments)    Vomiting, diarrhea, and abdominal cramping.   . Adhesive [Tape] Rash and Other (See Comments)    Pulls skin off   Lab Results:  Results for orders placed or performed during the hospital encounter of 05/05/15 (from the past 48 hour(s))  Urine rapid drug screen (hosp performed) (Not at [facility])     Status: Abnormal   Collection Time: 05/05/15  3:45 PM  Result Value Ref Range   Opiates POSITIVE (A) NONE DETECTED   Cocaine NONE DETECTED NONE DETECTED   Benzodiazepines POSITIVE (A) NONE DETECTED   Amphetamines NONE DETECTED NONE DETECTED   Tetrahydrocannabinol NONE DETECTED NONE DETECTED   Barbiturates NONE DETECTED NONE DETECTED    Comment:        DRUG SCREEN FOR MEDICAL PURPOSES ONLY.  IF CONFIRMATION IS NEEDED FOR ANY PURPOSE, NOTIFY LAB WITHIN 5 DAYS.        LOWEST DETECTABLE LIMITS FOR URINE DRUG SCREEN Drug Class       Cutoff (ng/mL) Amphetamine      1000 Barbiturate      200 Benzodiazepine   222 Tricyclics       979 Opiates          300 Cocaine          300 THC              50   Ethanol (ETOH)     Status: None   Collection Time: 05/05/15  3:57 PM  Result Value Ref Range   Alcohol, Ethyl (B) <5 <5 mg/dL    Comment:        LOWEST DETECTABLE LIMIT FOR SERUM ALCOHOL IS 5 mg/dL FOR MEDICAL PURPOSES ONLY   Salicylate level     Status: None   Collection Time: 05/05/15  3:57 PM  Result Value Ref Range   Salicylate Lvl <8.9 2.8 - 30.0 mg/dL  Acetaminophen level     Status: Abnormal   Collection Time: 05/05/15  3:57 PM  Result Value Ref Range   Acetaminophen (Tylenol), Serum <10 (L) 10 - 30 ug/mL    Comment:        THERAPEUTIC CONCENTRATIONS VARY SIGNIFICANTLY. A RANGE OF 10-30 ug/mL MAY BE AN EFFECTIVE CONCENTRATION FOR MANY PATIENTS. HOWEVER, SOME ARE BEST TREATED AT CONCENTRATIONS OUTSIDE THIS RANGE. ACETAMINOPHEN CONCENTRATIONS >150 ug/mL AT 4 HOURS AFTER INGESTION AND  >50 ug/mL AT 12 HOURS AFTER INGESTION ARE OFTEN ASSOCIATED WITH TOXIC REACTIONS.  Comprehensive metabolic panel     Status: Abnormal   Collection Time: 05/05/15  4:11 PM  Result Value Ref Range   Sodium 139 135 - 145 mmol/L   Potassium 4.3 3.5 - 5.1 mmol/L   Chloride 109 101 - 111 mmol/L   CO2 20 (L) 22 - 32 mmol/L   Glucose, Bld 95 65 - 99 mg/dL   BUN 11 6 - 20 mg/dL   Creatinine, Ser 0.85 0.44 - 1.00 mg/dL   Calcium 9.1 8.9 - 10.3 mg/dL   Total Protein 7.2 6.5 - 8.1 g/dL   Albumin 4.4 3.5 - 5.0 g/dL   AST 17 15 - 41 U/L   ALT 11 (L) 14 - 54 U/L   Alkaline Phosphatase 55 38 - 126 U/L   Total Bilirubin 0.8 0.3 - 1.2 mg/dL   GFR calc non Af Amer >60 >60 mL/min   GFR calc Af Amer >60 >60 mL/min    Comment: (NOTE) The eGFR has been calculated using the CKD EPI equation. This calculation has not been validated in all clinical situations. eGFR's persistently <60 mL/min signify possible Chronic Kidney Disease.    Anion gap 10 5 - 15  CBC     Status: None   Collection Time: 05/05/15  4:11 PM  Result Value Ref Range   WBC 6.9 4.0 - 10.5 K/uL   RBC 4.44 3.87 - 5.11 MIL/uL   Hemoglobin 13.3 12.0 - 15.0 g/dL   HCT 39.9 36.0 - 46.0 %   MCV 89.9 78.0 - 100.0 fL   MCH 30.0 26.0 - 34.0 pg   MCHC 33.3 30.0 - 36.0 g/dL   RDW 13.0 11.5 - 15.5 %   Platelets 200 150 - 761 K/uL   Metabolic Disorder Labs:  Lab Results  Component Value Date   HGBA1C 5.7* 08/22/2014   MPG 117* 08/22/2014   Lab Results  Component Value Date   PROLACTIN 7.6 06/18/2013   No results found for: CHOL, TRIG, HDL, CHOLHDL, VLDL, LDLCALC  Current Medications: Current Facility-Administered Medications  Medication Dose Route Frequency Provider Last Rate Last Dose  . beclomethasone (QVAR) 40 MCG/ACT inhaler 2 puff  2 puff Inhalation BID Laverle Hobby, PA-C   2 puff at 05/06/15 0848  . bupivacaine (MARCAINE) 0.5 % (with pres) injection 15 mL  15 mL Other Daily Laverle Hobby, PA-C   15 mL at 05/06/15 0848   . clobetasol ointment (TEMOVATE) 6.07 % 1 application  1 application Topical BID Laverle Hobby, PA-C      . clonazePAM (KLONOPIN) tablet 2 mg  2 mg Oral QHS Laverle Hobby, PA-C   2 mg at 05/06/15 0004  . doxycycline (VIBRA-TABS) tablet 100 mg  100 mg Oral Q12H Laverle Hobby, PA-C   100 mg at 05/06/15 0847  . fluticasone (FLONASE) 50 MCG/ACT nasal spray 2 spray  2 spray Each Nare Daily Laverle Hobby, PA-C   2 spray at 05/05/15 2312  . [START ON 05/07/2015] heparin injection 40,000 Units  40,000 Units Subcutaneous Daily Terri L Green, RPH      . hydrOXYzine (ATARAX/VISTARIL) tablet 50 mg  50 mg Oral QHS Laverle Hobby, PA-C   50 mg at 05/05/15 2341  . lamoTRIgine (LAMICTAL) tablet 200 mg  200 mg Oral QHS Laverle Hobby, PA-C   200 mg at 05/06/15 0004  . metroNIDAZOLE (FLAGYL) tablet 500 mg  500 mg Oral BID Laverle Hobby, PA-C   500 mg at 05/06/15 0847  .  metroNIDAZOLE (METROGEL) 8.33 % gel 1 application  1 application Topical BID Laverle Hobby, PA-C   1 application at 82/50/53 [phone]  . montelukast (SINGULAIR) tablet 10 mg  10 mg Oral BH-q7a Spencer E Simon, PA-C   10 mg at 05/06/15 3419  . pantoprazole (PROTONIX) EC tablet 40 mg  40 mg Oral Daily Laverle Hobby, PA-C   40 mg at 05/06/15 0847  . polyethylene glycol (MIRALAX / GLYCOLAX) packet 17 g  17 g Oral Daily PRN Laverle Hobby, PA-C      . promethazine (PHENERGAN) tablet 25 mg  25 mg Oral Q6H PRN Laverle Hobby, PA-C      . topiramate (TOPAMAX) tablet 150 mg  150 mg Oral Daily Laverle Hobby, PA-C   150 mg at 05/06/15 0847  . traZODone (DESYREL) tablet 200 mg  200 mg Oral QHS Laverle Hobby, PA-C   200 mg at 05/05/15 2340   PTA Medications: Prescriptions prior to admission  Medication Sig Dispense Refill Last Dose  . acetaminophen-codeine (TYLENOL #3) 300-30 MG tablet Take 1 tablet by mouth 2 (two) times daily.   05/04/2015 at Unknown time  . bupivacaine (MARCAINE) 0.5 % injection Instill 15 cc into bladder daily    05/04/2015 at Unknown time  . clobetasol ointment (TEMOVATE) 3.79 % Apply 1 application topically 2 (two) times daily. 60 g 0 05/04/2015 at Unknown time  . clonazePAM (KLONOPIN) 2 MG tablet Take 1 tablet (2 mg total) by mouth daily. (Patient taking differently: Take 2 mg by mouth at bedtime. ) 30 tablet  05/04/2015 at Unknown time  . doxycycline (VIBRAMYCIN) 100 MG capsule Take 100 mg by mouth 2 (two) times daily.  0 05/05/2015 at Unknown time  . heparin 10000 UNIT/ML injection Instill 4 cc into bladder daily   05/04/2015 at Unknown time  . hydrOXYzine (ATARAX/VISTARIL) 25 MG tablet Take 50 mg by mouth at bedtime. Takes 2 tablets daily   05/05/2015 at Unknown time  . lamoTRIgine (LAMICTAL) 200 MG tablet Take 200 mg by mouth at bedtime.    05/04/2015 at Unknown time  . metroNIDAZOLE (FLAGYL) 500 MG tablet Take 1 tablet (500 mg total) by mouth 2 (two) times daily. 28 tablet 0 05/05/2015 at Unknown time  . metroNIDAZOLE (METROGEL) 0.75 % gel Apply 1 application topically 2 (two) times daily.  5 05/05/2015 at Unknown time  . montelukast (SINGULAIR) 10 MG tablet Take 1 tablet (10 mg total) by mouth every morning.   05/05/2015 at Unknown time  . omeprazole (PRILOSEC) 40 MG capsule Take 40 mg by mouth daily.   05/04/2015 at Unknown time  . polyethylene glycol (MIRALAX / GLYCOLAX) packet Take 17 g by mouth daily as needed for mild constipation.   Past Week at Unknown time  . Probiotic Product (PROBIOTIC PO) Take 1 tablet by mouth daily.   Past Week at Unknown time  . promethazine (PHENERGAN) 25 MG tablet Take 25 mg by mouth every 6 (six) hours as needed for nausea or vomiting.    Past Week at Unknown time  . QVAR 40 MCG/ACT inhaler Inhale 2 puffs into the lungs 2 (two) times daily.   5 Past Month at Unknown time  . topiramate (TOPAMAX) 50 MG tablet Take 150 mg by mouth daily.   1 05/05/2015 at Unknown time  . traZODone (DESYREL) 100 MG tablet Take 2 tablets (200 mg total) by mouth at bedtime.   05/04/2015 at  Unknown time   Musculoskeletal: Strength & Muscle Tone: within  normal limits Gait & Station: normal Patient leans: N/A  Psychiatric Specialty Exam: Physical Exam  Constitutional: Erika Mccoy is oriented to person, place, and time. Erika Mccoy appears well-developed and well-nourished.  HENT:  Head: Normocephalic and atraumatic.  Eyes: Pupils are equal, round, and reactive to light.  Neck: Normal range of motion.  Cardiovascular: Normal rate.   Respiratory: Effort normal and breath sounds normal.  GI: Soft. Bowel sounds are normal.  Genitourinary:  Patient with hx urinary retention. Does own in & out straight catherization.   Musculoskeletal: Normal range of motion.  Neurological: Erika Mccoy is alert and oriented to person, place, and time.  Skin: Skin is warm and dry.    Review of Systems  Constitutional: Positive for malaise/fatigue.  HENT: Negative.   Eyes: Negative.   Respiratory: Negative.   Cardiovascular: Negative.   Gastrointestinal: Negative.   Genitourinary:       Patient has urinary retention. Does own in & out catherization.  Musculoskeletal: Positive for myalgias and joint pain.  Skin: Negative.   Neurological: Positive for dizziness and weakness.  Endo/Heme/Allergies: Negative.   Psychiatric/Behavioral: Positive for depression. Negative for suicidal ideas, hallucinations and memory loss. The patient is nervous/anxious and has insomnia.     Blood pressure 85/69, pulse 104, temperature 99.3 F (37.4 C), temperature source Oral, resp. rate 16, height _0  (1.626 m), weight 60.328 kg (133 lb), last menstrual period 04/12/2015.Body mass index is 22.82 kg/(m^2).  General Appearance: Casual  Eye Contact::  Poor  Speech:  Clear and Coherent  Volume:  Normal  Mood:  Anxious, Depressed and Hopeless  Affect:  Congruent and Flat  Thought Process:  Coherent and Logical  Orientation:  Full (Time, Place, and Person)  Thought Content:  Rumination  Suicidal Thoughts:  No  Homicidal Thoughts:   No  Memory:  Grossly intact  Judgement:  Fair  Insight:  Present  Psychomotor Activity:  Restlessness  Concentration:  Fair  Recall:  Good  Fund of Knowledge:Fair  Language: Good  Akathisia:  No  Handed:  Right  AIMS (if indicated):     Assets:  Communication Skills Desire for Improvement  ADL's:  Intact  Cognition: WNL  Sleep:  Number of Hours: 6   Treatment Plan/Recommendations: 1. Admit for crisis management and stabilization, estimated length of stay 3-5 days.  2. Medication management to reduce current symptoms to base line and improve the patient's overall level of functioning  3. Treat health problems as indicated.  4. Develop treatment plan to decrease risk of relapse upon discharge and the need for readmission.  5. Psycho-social education regarding relapse prevention and self care.  6. Health care follow up as needed for medical problems.  7. Review, reconcile, and reinstate any pertinent home medications for other health issues where appropriate. 8. Call for consults with hospitalist for any additional specialty patient care services as needed.  Observation Level/Precautions:  15 minute checks  Laboratory:  Per ED, UDS positive for Opiates & Benzodiazepine  Psychotherapy: Group sessions   Medications: Trazodone 200 mg,   Consultations: As needed    Discharge Concerns: Safety, mood stability   Estimated LOS: 3-5 days  Other:     I certify that inpatient services furnished can reasonably be expected to improve the patient's condition.   [place], [place], FNP-BC 12/21/20169:53 AM   I have seen and examined the patient and agreed with the findings of H&P and treatment Plan.  Berniece Andreas, MD

## 2015-05-06 NOTE — Progress Notes (Signed)
Patient ID: Erika Mccoy, female   DOB: Nov 16, 1965, 49 y.o.   MRN: [id number]  Admission Note:  D:49 yr female who presents [facility] in no acute distress for the treatment of SI and Depression. Pt appears flat and depressed, but very labile and moody. Pt was calm and cooperative with admission process. Pt presents with passive SI and contracts for safety upon admission. Pt denies AVH . Pt stated she cut her wrist before coming in then called her mother because she realized she needed help. Pt has IC and self catherizes daily and does Bladder instillations . PT on Flagyl / Doxy for PID .    A:Skin was assessed and documented per female nurse . PT searched and no contraband found, POC and unit policies explained and understanding verbalized. Consents obtained. Food and fluids offered, and  accepted.   R:Pt had no additional questions or concerns.

## 2015-05-06 NOTE — Telephone Encounter (Signed)
-----   Message from Patton Salles, MD sent at 05/06/2015  5:38 AM EST ----- Results to patient through My Chart. "Hi Erika Mccoy,  Our tests are negative for gonorrhea and chlamydia.  I recommend you finish the course of antibiotics.  Have good Holidays and try to enjoy!  I will see you back in the office after New Year's.  [name], MD"

## 2015-05-06 NOTE — [facility] Group Notes (Signed)
[facility] LCSW Aftercare Discharge Planning Group Note  05/06/2015 8:45 AM  Participation Quality: Alert, Appropriate and Oriented  Mood/Affect: Labile; Irritable  Depression Rating: "worse than when I got here"  Anxiety Rating: "worse than when I got here"  Thoughts of Suicide: Pt denies SI/HI  Will you contract for safety? Yes  Current AVH: Pt denies  Plan for Discharge/Comments: Pt attended discharge planning group and actively participated in group. CSW discussed suicide prevention education with the group and encouraged them to discuss discharge planning and any relevant barriers. Pt became irritable when describing how her pain medication order had not come through. Pt voiced raised and she stated that she would never come to this facility again. CSW directed her to speak with the doctor about pain medication orders. She reports that she is established with a therapist and psychiatrist currently.  Transportation Means: Pt reports access to transportation  Supports: No supports mentioned at this time  Chad Cordial, LCSWA 05/06/2015 9:50 AM

## 2015-05-07 NOTE — Progress Notes (Signed)
Pt did not attend wrap up group this evening.  

## 2015-05-07 NOTE — [facility] Suicide Risk Assessment (Signed)
[facility] INPATIENT:  Family/Significant Other Suicide Prevention Education  Suicide Prevention Education:  Education Completed; Marko Plume, Pt's mother [phone], has been identified by the patient as the family member/significant other with whom the patient will be residing, and identified as the person(s) who will aid the patient in the event of a mental health crisis (suicidal ideations/suicide attempt).  With written consent from the patient, the family member/significant other has been provided the following suicide prevention education, prior to the and/or following the discharge of the patient.  The suicide prevention education provided includes the following:  Suicide risk factors  Suicide prevention and interventions  National Suicide Hotline telephone number  [facility] assessment telephone number  [facility] Emergency Assistance 911  [name] and/or Residential Mobile Crisis Unit telephone number  Request made of family/significant other to:  Remove weapons (e.g., guns, rifles, knives), all items previously/currently identified as safety concern.    Remove drugs/medications (over-the-counter, prescriptions, illicit drugs), all items previously/currently identified as a safety concern.  The family member/significant other verbalizes understanding of the suicide prevention education information provided.  The family member/significant other agrees to remove the items of safety concern listed above.  Elaina Hoops 05/07/2015, 3:51 PM

## 2015-05-07 NOTE — [facility] Group Notes (Signed)
[facility] Group Notes:  (Nursing/MHT/Case Management/Adjunct)  Date:  05/07/2015  Time:  12:17 PM  Type of Therapy:  Nurse Education  Participation Level:  Active  Participation Quality:  Appropriate and Supportive  Affect:  Appropriate  Cognitive:  Alert and Appropriate  Insight:  Improving  Engagement in Group:  Developing/Improving  Modes of Intervention:  Discussion and Education  Summary of Progress/Problems:  Group topic today is Lifestyle and Leisure changes. Talked about coping skills.  She states that she listens to music for leisure.    Norm Parcel Seira Cody 05/07/2015, 12:17 PM

## 2015-05-07 NOTE — Progress Notes (Signed)
Patient has been complaining of sedation due to medication changes.  Patient talked to nurse about requesting to have medication schedule changed.  Patient attempted to engage in groups but was extremely tired.  Patient had complaints of pain but was able to have relief in pain with medications.  Patient reported feeling her mood improve with the new medications.    Assess patient for safety, offer medications as prescribed, engage patient in 1:1 therapeutic talks, encourage patient to engage in therapy.  Patient was able to contract for safety.  Patient denies SI, HI, and AVH.  Patient was free of self injurious behaviors.
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
## 2015-05-07 NOTE — Progress Notes (Signed)
[facility] MD Progress Note  05/07/2015 2:14 PM Erika Mccoy  MRN:  [id number]  Subjective: [name] reports, "I believe that my mood is starting to improve. But, I'm not where my mood needs to be yet. I'm still in bed because I did not sleep well last night. Also, my bladder hurts. My bladder problem is not new. It is a condition that does not have any cure. That is why I take the antibiotics & have to catherize myself at least once a day. I'm not suicidal. Absolutely, no voice hearing. Appetite is good".  Principal Problem: Bipolar disorder, manic (HCC)  Diagnosis:   Patient Active Problem List   Diagnosis Date Noted  . Bipolar disorder, manic (HCC) [F31.10] 05/05/2015  . Fothergill's neuralgia [G50.0] 01/15/2014  . Rotator cuff syndrome [M75.100] 07/15/2013  . ANA positive [R76.8] 03/12/2013  . PCB (post coital bleeding) [N93.0] 01/18/2013  . Lichen sclerosus [L90.0] 12/03/2012  . Irritable bowel syndrome [K58.9] 12/03/2012  . Interstitial cystitis [N30.10] 12/03/2012  . Hypertriglyceridemia [E78.1] 10/25/2011  . Allergic rhinitis, seasonal [J30.2] 09/28/2011  . Adhesions of vagina [N89.5] 08/22/2011  . 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 Acne [L70.9] 12/21/2010  . Bipolar 1 disorder (HCC) [F31.9] 12/09/2010  . 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 Fibrositis [M79.7] 12/09/2010  . Anxiety, generalized [F41.1] 12/09/2010  . 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  Past Medical History:  Past Medical History  Diagnosis Date  . Interstitial cystitis   . Fibromyalgia   . Anxiety   . Lichen sclerosus of female genitalia   . Manic depression (HCC)   . HSV-2 infection     rare occurence  . MS (multiple sclerosis) (HCC)   . IBS (irritable bowel syndrome)   . History of self mutilation   . Arm sprain 5/11    right   . Lichen sclerosus     Vulva   . STD (sexually transmitted disease) 1989    Hx of HSV II, genital warts  . Genital warts   . Abnormal Pap smear of cervix 1997    --hx of conization of cervix by Dr. Roberto Scales  . Broken arm     left arm by elbow    Past Surgical History  Procedure Laterality Date  . Endometrial ablation  1997/1998  . Breast enhancement surgery  1990  . Cervix lesion destruction  1997    Dr. Roberto Scales  . Tonsillectomy    . Nasal sinus surgery     Family History:  Family History  Problem Relation Age of Onset  . Hypertension Mother   . Hyperlipidemia Mother   . Lung cancer Mother   . Thyroid cancer Mother   . COPD Mother   . Lung cancer Father 75  . Heart attack Father 50    3 vessel CABG  . Leukemia Maternal Grandmother   . Stroke Maternal Grandfather   . Thyroid disease Maternal Aunt   . Stroke Paternal Grandmother   . Heart failure Paternal Grandfather   . Osteoarthritis Maternal Aunt    Family Psychiatric  History: See H&P  Social History:  History  Alcohol Use  . 0.6 oz/week  . 1 Standard drinks or equivalent per week     History  Drug Use  . 1.00 per week  . Special: Marijuana    Social History   Social History  . Marital Status: Married    Spouse Name: N/A  .  Number of Children: N/A  . Years of Education: N/A   Social History Main Topics  . Smoking status: Never Smoker   . Smokeless tobacco: Never Used  . Alcohol Use: 0.6 oz/week    1 Standard drinks or equivalent per week  . Drug Use: 1.00 per week    Special: Marijuana  . Sexual Activity:    Partners: Male    Pharmacist, hospital Protection: Surgical     Comment: Ablation   Other Topics Concern  . None   Social History Narrative   Additional Social History:   Sleep: Fair  Appetite:  Good  Current Medications: Current Facility-Administered Medications  Medication Dose Route Frequency Provider Last Rate Last Dose  . beclomethasone (QVAR) 40 MCG/ACT inhaler 2 puff  2 puff Inhalation BID Kerry Hough, PA-C    2 puff at 05/06/15 0848  . bupivacaine (MARCAINE) 0.5 % injection 25 mL  25 mL Other Daily PRN Otho Bellows, RPH      . clobetasol ointment (TEMOVATE) 0.05 % 1 application  1 application Topical BID Kerry Hough, PA-C   1 application at 05/06/15 1004  . clonazePAM (KLONOPIN) tablet 1 mg  1 mg Oral BID Sanjuana Kava, NP   1 mg at 05/07/15 0804  . doxycycline (VIBRA-TABS) tablet 100 mg  100 mg Oral Q12H Kerry Hough, PA-C   100 mg at 05/07/15 4034  . fluPHENAZine (PROLIXIN) tablet 5 mg  5 mg Oral BID Sanjuana Kava, NP   5 mg at 05/07/15 0804  . fluticasone (FLONASE) 50 MCG/ACT nasal spray 2 spray  2 spray Each Nare Daily Kerry Hough, PA-C   2 spray at 05/07/15 0804  . heparin injection 40,000 Units  40,000 Units Subcutaneous Daily PRN Sanjuana Kava, NP      . hydrOXYzine (ATARAX/VISTARIL) tablet 50 mg  50 mg Oral QHS Kerry Hough, PA-C   50 mg at 05/06/15 2113  . lamoTRIgine (LAMICTAL) tablet 250 mg  250 mg Oral QHS Sanjuana Kava, NP   250 mg at 05/06/15 2113  . metroNIDAZOLE (FLAGYL) tablet 500 mg  500 mg Oral BID Kerry Hough, PA-C   500 mg at 05/07/15 7425  . metroNIDAZOLE (METROGEL) 0.75 % gel 1 application  1 application Topical BID Kerry Hough, PA-C   1 application at 05/07/15 9563  . montelukast (SINGULAIR) tablet 10 mg  10 mg Oral BH-q7a Spencer E Simon, PA-C   10 mg at 05/07/15 8756  . pantoprazole (PROTONIX) EC tablet 40 mg  40 mg Oral Daily Kerry Hough, PA-C   40 mg at 05/07/15 4332  . polyethylene glycol (MIRALAX / GLYCOLAX) packet 17 g  17 g Oral Daily PRN Kerry Hough, PA-C      . promethazine (PHENERGAN) tablet 25 mg  25 mg Oral Q6H PRN Kerry Hough, PA-C      . topiramate (TOPAMAX) tablet 150 mg  150 mg Oral Daily Kerry Hough, PA-C   150 mg at 05/07/15 0804  . traMADol (ULTRAM) tablet 50 mg  50 mg Oral TID PRN Sanjuana Kava, NP   50 mg at 05/07/15 0804  . traZODone (DESYREL) tablet 200 mg  200 mg Oral QHS Kerry Hough, PA-C   200 mg at 05/06/15  2113    Lab Results:  Results for orders placed or performed during the hospital encounter of 05/05/15 (from the past 48 hour(s))  Glucose, capillary     Status: None  Collection Time: 05/06/15  5:03 PM  Result Value Ref Range   Glucose-Capillary 94 65 - 99 mg/dL    Physical Findings: AIMS: Facial and Oral Movements Muscles of Facial Expression: None, normal Lips and Perioral Area: None, normal Jaw: None, normal Tongue: None, normal,Extremity Movements Upper (arms, wrists, hands, fingers): None, normal Lower (legs, knees, ankles, toes): None, normal, Trunk Movements Neck, shoulders, hips: None, normal, Overall Severity Severity of abnormal movements (highest score from questions above): None, normal Incapacitation due to abnormal movements: None, normal Patient's awareness of abnormal movements (rate only patient's report): No Awareness, Dental Status Current problems with teeth and/or dentures?: No Does patient usually wear dentures?: No  CIWA:  CIWA-Ar Total: 0 COWS:  COWS Total Score: 1  Musculoskeletal: Strength & Muscle Tone: within normal limits Gait & Station: normal Patient leans: N/A  Psychiatric Specialty Exam: Review of Systems  Constitutional: Negative.   HENT: Negative.   Eyes: Negative.   Respiratory: Negative.   Cardiovascular: Negative.   Gastrointestinal: Negative.   Genitourinary: Negative.   Musculoskeletal: Negative.   Skin: Negative.   Neurological: Negative.   Endo/Heme/Allergies: Negative.   Psychiatric/Behavioral: Positive for depression ("Improving"). Negative for suicidal ideas, hallucinations, memory loss and substance abuse. The patient is nervous/anxious ("Improving") and has insomnia.     Blood pressure 97/60, pulse 113, temperature 97.9 F (36.6 C), temperature source Oral, resp. rate 18, height  (1.626 m), weight 60.328 kg (133 lb), last menstrual period 04/12/2015.Body mass index is 22.82 kg/(m^2).  General Appearance: Casual   Eye Contact::Fair  Speech: Clear and Coherent  Volume: Normal  Mood: Anxious, Depressed and Hopeless  Affect: Congruent and Flat  Thought Process: Coherent and Logical  Orientation: Full (Time, Place, and Person)  Thought Content: Rumination  Suicidal Thoughts: No  Homicidal Thoughts: No  Memory: Grossly intact  Judgement: Fair  Insight: Present  Psychomotor Activity: Normal  Concentration: Fair  Recall: Good  Fund of Knowledge:Fair  Language: Good  Akathisia: No  Handed: Right  AIMS (if indicated):    Assets: Communication Skills Desire for Improvement  ADL's: Intact  Cognition: WNL  Sleep: Number of Hours: 6          Treatment Plan Summary: 1. Continue crisis management, mood stabilization treatments. 2. Continue current medication management to reduce current symptoms to base line and improve patient's overall level of functioning; Prolixin 5 mg for mood control, Klonopin 1 mg for severe anxiety, Hydroxyzine 50 mg for anxiety, Lamictal 250 mg for mood stabilization, Topamax 150 mg for mood stabilization & Trazodone 200 mg for insomnia.. 3. Treat health problems as indicated; resumed Qvar inhaler, Temovate ointment application, Vibra-Tabs 100 mg, Flonase spray, Heparin , flagyl 500 mg, Singulair 10 mg, Protonix 40 mg, Miralax 17 gm, phenergan 25 mg, Tramadol 50 mg for all the pre-existing medical issues presented  4. Develop treatment plan to enhance medication adeherance upon discharge and the need for  readmission. 5. Psycho-social education regarding self care.  Armandina Stammer I, PMHNP, FNP-BC 05/07/2015, 2:14 PM   I reviewed chart and agreed with the findings and treatment Plan.  Kathryne Sharper, MD

## 2015-05-07 NOTE — [facility] Group Notes (Signed)
[facility] LCSW Group Therapy 05/07/2015 1:15pm  Type of Therapy: Group Therapy- Balance in Life  Participation Level: Active   Description of the Group:  The topic for group was balance in life. Today's group focused on defining balance in one's own words, identifying things that can knock one off balance, and exploring healthy ways to maintain balance in life. Group members were asked to provide an example of a time when they felt off balance, describe how they handled that situation,and process healthier ways to regain balance in the future. Group members were asked to share the most important tool for maintaining balance that they learned while at [facility] and how they plan to apply this method after discharge.  Summary of Patient Progress Pt was observed to be lethargic in group today. However, Pt participates when awake. She identified a need to have more activities for herself in order to have a more balanced life.    Therapeutic Modalities:   Cognitive Behavioral Therapy Solution-Focused Therapy Assertiveness Training   Chad Cordial, LCSWA 05/07/2015 6:07 PM

## 2015-05-07 NOTE — Progress Notes (Addendum)
Patient ID: Erika Mccoy, female   DOB: 05-08-1966, 49 y.o.   MRN: [id number] D: Client in bed most of this shift, reports "drowsiness" with medications. Notes groups have been helpful, "this evening group was about balance and taking care of yourself" A: Writer encouraged client to reports medication concerns with physician. Client encouraged to rise slowly, notify staff of dizziness and use side rails. Staff will monitor q58min for safety. R: Client is safe on the unit, did not attended group.

## 2015-05-08 MED ORDER — SACCHAROMYCES BOULARDII 250 MG PO CAPS
250.0000 mg | ORAL_CAPSULE | Freq: Two times a day (BID) | ORAL | Status: DC
  Administered 2015-05-08 – 2015-05-10 (×4): 250 mg via ORAL
  Filled 2015-05-08 (×8): qty 1

## 2015-05-08 MED ORDER — LOPERAMIDE HCL 2 MG PO CAPS
2.0000 mg | ORAL_CAPSULE | ORAL | Status: DC | PRN
  Administered 2015-05-09 – 2015-05-10 (×2): 2 mg via ORAL
  Filled 2015-05-08 (×2): qty 1

## 2015-05-08 MED ORDER — FLUTICASONE PROPIONATE HFA 44 MCG/ACT IN AERO
2.0000 | INHALATION_SPRAY | Freq: Two times a day (BID) | RESPIRATORY_TRACT | Status: DC
  Administered 2015-05-08 – 2015-05-10 (×2): 2 via RESPIRATORY_TRACT
  Filled 2015-05-08 (×2): qty 10.6

## 2015-05-08 MED ORDER — LOPERAMIDE HCL 2 MG PO CAPS
4.0000 mg | ORAL_CAPSULE | Freq: Once | ORAL | Status: AC
  Administered 2015-05-08: 4 mg via ORAL
  Filled 2015-05-08 (×2): qty 2

## 2015-05-08 NOTE — Progress Notes (Signed)
Patient has been had reports of decreased depression and anxiety.  Patient has reported that a increase in her energy and has been able to attend and participate in groups this shift.   Patient denies AVH, SI and HI.  Patient has been compliant with medications and has been actively participating in therapy.    Assess client for safety, offer medications as prescribed   Patient able to contract for safety.

## 2015-05-08 NOTE — Progress Notes (Signed)
Adult Psychoeducational Group Note  Date:  05/08/2015 Time:  2015  Group Topic/Focus:  Wrap-Up Group:   The focus of this group is to help patients review their daily goal of treatment and discuss progress on daily workbooks.  Participation Level:  Active  Participation Quality:  Appropriate  Affect:  Appropriate  Cognitive:  Appropriate  Insight: Appropriate  Engagement in Group:  Engaged  Modes of Intervention:  Discussion  Additional Comments:  During wrap up group Pt stated that she has been very anxious about her discharge plan. Pt stated that she has never been away from her children for Christmas and thinks if she has to be away from them this year she would be more depressed. Pt stated that her faith is the only thing that will keep her strong if she is not home for Christmas. Pt rated her day an eight due to boredom.   Edelmiro Innocent Chanel 05/08/2015, 11:35 PM

## 2015-05-08 NOTE — Progress Notes (Signed)
Patient ID: Erika Mccoy, female   DOB: 11/16/1965, 49 y.o.   MRN: [id number] D: Client is seen in dayroom watching TV, interacting with peers. Client reports "feeling great" "good day" Client then reports anxiety of "6" of 10 due to being here. "I'm having increased depression because I want to be home with my children, my husband"   "I have never been away from my family on the holidays" A: Writer encouraged client to speak with physician about discharge plans. Staff will monitor q48min for safety. R: Client is safe on the unit, attended group.

## 2015-05-08 NOTE — [facility] Group Notes (Signed)
[facility] LCSW Group Therapy 05/08/2015 1:15pm  Type of Therapy: Group Therapy- Feelings Around Relapse and Recovery  Participation Level: Active   Participation Quality:  Appropriate  Affect:  Appropriate  Cognitive: Alert and Oriented   Insight:  Developing   Engagement in Therapy: Developing/Improving and Engaged   Modes of Intervention: Clarification, Confrontation, Discussion, Education, Exploration, Limit-setting, Orientation, Problem-solving, Rapport Building, Dance movement psychotherapist, Socialization and Support  Summary of Progress/Problems: The topic for today was feelings about relapse. The group discussed what relapse prevention is to them and identified triggers that they are on the path to relapse. Members also processed their feeling towards relapse and were able to relate to common experiences. Group also discussed coping skills that can be used for relapse prevention.  Pt participated in group discussion and processed the effects of relapse on her family. Pt reports that she feels her family sees her as a disappointment, especially when she relapses. Pt described feelings of guilt, especially regarding how her relapse affects her mother. Pt was able to process how she does not feel her daughter's mental health issues are a burden and discussed how her mother's perceptions of Pt's mental illness may be similar to Pt's perception of her daughter's.   Therapeutic Modalities:   Cognitive Behavioral Therapy Solution-Focused Therapy Assertiveness Training Relapse Prevention Therapy    Erika Mccoy [phone] 05/08/2015 2:23 PM

## 2015-05-08 NOTE — [facility] Group Notes (Signed)
[facility] LCSW Aftercare Discharge Planning Group Note  05/08/2015 8:45 AM  Participation Quality: Alert, Appropriate and Oriented  Mood/Affect: Lethargic  Depression Rating: 2  Anxiety Rating: 7- related to DC  Thoughts of Suicide: Pt denies SI/HI  Will you contract for safety? Yes  Current AVH: Pt denies  Plan for Discharge/Comments: Pt attended discharge planning group and actively participated in group. CSW discussed suicide prevention education with the group and encouraged them to discuss discharge planning and any relevant barriers. Pt expresses desire to DC; main concern is dosage of medications are making her drowsy during the day.  Transportation Means: Pt reports access to transportation  Supports: No supports mentioned at this time  Chad Cordial, LCSWA 05/08/2015 9:30 AM

## 2015-05-08 NOTE — Progress Notes (Signed)
[facility] MD Progress Note  05/08/2015  Erika Mccoy  MRN:  [id number]  Subjective: Pt states: "I'm doing pretty good. I just keep having loose stools."   Objective: Pt seen and chart reviewed. Pt is alert/oriented x4, calm, cooperative, and appropriate to situation. Pt denies suicidal/homicidal ideation and psychosis and does not appear to be responding to internal stimuli. Pt reports having 8 bouts of diarrhea today and has been feeling tired. She reports improvement in anxiety/depression and is participating in the group therapy with benefit.   Principal Problem: Bipolar disorder, manic (HCC)  Diagnosis:   Patient Active Problem List   Diagnosis Date Noted  . Bipolar disorder, manic (HCC) [F31.10] 05/05/2015    Priority: High  . Fothergill's neuralgia [G50.0] 01/15/2014  . Rotator cuff syndrome [M75.100] 07/15/2013  . ANA positive [R76.8] 03/12/2013  . PCB (post coital bleeding) [N93.0] 01/18/2013  . Lichen sclerosus [L90.0] 12/03/2012  . Irritable bowel syndrome [K58.9] 12/03/2012  . Interstitial cystitis [N30.10] 12/03/2012  . Hypertriglyceridemia [E78.1] 10/25/2011  . Allergic rhinitis, seasonal [J30.2] 09/28/2011  . Adhesions of vagina [N89.5] 08/22/2011  . 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 Acne [L70.9] 12/21/2010  . Bipolar 1 disorder (HCC) [F31.9] 12/09/2010  . 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 Fibrositis [M79.7] 12/09/2010  . Anxiety, generalized [F41.1] 12/09/2010  . 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  Past Medical History:  Past Medical History  Diagnosis Date  . Interstitial cystitis   . Fibromyalgia   . Anxiety   . Lichen sclerosus of female genitalia   . Manic depression (HCC)   . HSV-2 infection     rare occurence  . MS (multiple sclerosis) (HCC)   . IBS (irritable bowel syndrome)   . History of self mutilation    . Arm sprain 5/11    right   . Lichen sclerosus     Vulva  . STD (sexually transmitted disease) 1989    Hx of HSV II, genital warts  . Genital warts   . Abnormal Pap smear of cervix 1997    --hx of conization of cervix by Dr. Roberto Scales  . Broken arm     left arm by elbow    Past Surgical History  Procedure Laterality Date  . Endometrial ablation  1997/1998  . Breast enhancement surgery  1990  . Cervix lesion destruction  1997    Dr. Roberto Scales  . Tonsillectomy    . Nasal sinus surgery     Family History:  Family History  Problem Relation Age of Onset  . Hypertension Mother   . Hyperlipidemia Mother   . Lung cancer Mother   . Thyroid cancer Mother   . COPD Mother   . Lung cancer Father 75  . Heart attack Father 50    3 vessel CABG  . Leukemia Maternal Grandmother   . Stroke Maternal Grandfather   . Thyroid disease Maternal Aunt   . Stroke Paternal Grandmother   . Heart failure Paternal Grandfather   . Osteoarthritis Maternal Aunt    Family Psychiatric  History: See H&P  Social History:  History  Alcohol Use  . 0.6 oz/week  . 1 Standard drinks or equivalent per week     History  Drug Use  . 1.00 per week  . Special: Marijuana    Social History   Social History  . Marital Status: Married    Spouse Name: N/A  .  Number of Children: N/A  . Years of Education: N/A   Social History Main Topics  . Smoking status: Never Smoker   . Smokeless tobacco: Never Used  . Alcohol Use: 0.6 oz/week    1 Standard drinks or equivalent per week  . Drug Use: 1.00 per week    Special: Marijuana  . Sexual Activity:    Partners: Male    Pharmacist, hospital Protection: Surgical     Comment: Ablation   Other Topics Concern  . None   Social History Narrative   Additional Social History:   Sleep: Good  Appetite:  Good  Current Medications: Current Facility-Administered Medications  Medication Dose Route Frequency Provider Last Rate Last Dose  . beclomethasone (QVAR) 40  MCG/ACT inhaler 2 puff  2 puff Inhalation BID Kerry Hough, PA-C   2 puff at 05/06/15 0848  . bupivacaine (MARCAINE) 0.5 % injection 25 mL  25 mL Other Daily PRN Otho Bellows, RPH      . clobetasol ointment (TEMOVATE) 0.05 % 1 application  1 application Topical BID Kerry Hough, PA-C   1 application at 05/08/15 [phone]  . clonazePAM (KLONOPIN) tablet 1 mg  1 mg Oral BID Sanjuana Kava, NP   1 mg at 05/08/15 9604  . doxycycline (VIBRA-TABS) tablet 100 mg  100 mg Oral Q12H Mena Goes Simon, PA-C   100 mg at 05/08/15 0800  . fluPHENAZine (PROLIXIN) tablet 5 mg  5 mg Oral BID Sanjuana Kava, NP   5 mg at 05/08/15 0801  . fluticasone (FLONASE) 50 MCG/ACT nasal spray 2 spray  2 spray Each Nare Daily Kerry Hough, PA-C   2 spray at 05/08/15 5409  . heparin injection 40,000 Units  40,000 Units Subcutaneous Daily PRN Sanjuana Kava, NP      . hydrOXYzine (ATARAX/VISTARIL) tablet 50 mg  50 mg Oral QHS Kerry Hough, PA-C   50 mg at 05/06/15 2113  . lamoTRIgine (LAMICTAL) tablet 250 mg  250 mg Oral QHS Sanjuana Kava, NP   250 mg at 05/07/15 2252  . loperamide (IMODIUM) capsule 2 mg  2 mg Oral Q4H PRN Beau Fanny, FNP      . loperamide (IMODIUM) capsule 4 mg  4 mg Oral Once Beau Fanny, FNP      . metroNIDAZOLE (FLAGYL) tablet 500 mg  500 mg Oral BID Kerry Hough, PA-C   500 mg at 05/08/15 0801  . metroNIDAZOLE (METROGEL) 0.75 % gel 1 application  1 application Topical BID Kerry Hough, PA-C   1 application at 05/08/15 8119  . montelukast (SINGULAIR) tablet 10 mg  10 mg Oral BH-q7a Kerry Hough, PA-C   10 mg at 05/08/15 1478  . pantoprazole (PROTONIX) EC tablet 40 mg  40 mg Oral Daily Kerry Hough, PA-C   40 mg at 05/08/15 0800  . polyethylene glycol (MIRALAX / GLYCOLAX) packet 17 g  17 g Oral Daily PRN Kerry Hough, PA-C      . promethazine (PHENERGAN) tablet 25 mg  25 mg Oral Q6H PRN Kerry Hough, PA-C      . topiramate (TOPAMAX) tablet 150 mg  150 mg Oral Daily Kerry Hough,  PA-C   150 mg at 05/08/15 0801  . traMADol (ULTRAM) tablet 50 mg  50 mg Oral TID PRN Sanjuana Kava, NP   50 mg at 05/07/15 0804  . traZODone (DESYREL) tablet 200 mg  200 mg Oral QHS Karleen Hampshire E  Simon, PA-C   200 mg at 05/07/15 2251    Lab Results:  Results for orders placed or performed during the hospital encounter of 05/05/15 (from the past 48 hour(s))  Glucose, capillary     Status: None   Collection Time: 05/06/15  5:03 PM  Result Value Ref Range   Glucose-Capillary 94 65 - 99 mg/dL    Physical Findings: AIMS: Facial and Oral Movements Muscles of Facial Expression: None, normal Lips and Perioral Area: None, normal Jaw: None, normal Tongue: None, normal,Extremity Movements Upper (arms, wrists, hands, fingers): None, normal Lower (legs, knees, ankles, toes): None, normal, Trunk Movements Neck, shoulders, hips: None, normal, Overall Severity Severity of abnormal movements (highest score from questions above): None, normal Incapacitation due to abnormal movements: None, normal Patient's awareness of abnormal movements (rate only patient's report): No Awareness, Dental Status Current problems with teeth and/or dentures?: No Does patient usually wear dentures?: No  CIWA:  CIWA-Ar Total: 0 COWS:  COWS Total Score: 1  Musculoskeletal: Strength & Muscle Tone: within normal limits Gait & Station: normal Patient leans: N/A  Psychiatric Specialty Exam: Review of Systems  Constitutional: Negative.   HENT: Negative.   Eyes: Negative.   Respiratory: Negative.   Cardiovascular: Negative.   Gastrointestinal: Negative.   Genitourinary: Negative.   Musculoskeletal: Negative.   Skin: Negative.   Neurological: Negative.   Endo/Heme/Allergies: Negative.   Psychiatric/Behavioral: Positive for depression ("Improving"). Negative for suicidal ideas, hallucinations, memory loss and substance abuse. The patient is nervous/anxious ("Improving") and has insomnia.   All other systems reviewed and  are negative.   Blood pressure 89/46, pulse 114, temperature 98.9 F (37.2 C), temperature source Oral, resp. rate 16, height 5\' 4"  (1.626 m), weight 60.328 kg (133 lb), last menstrual period 04/12/2015.Body mass index is 22.82 kg/(m^2).  General Appearance: Casual  Eye Contact::Fair  Speech: Clear and Coherent  Volume: Normal  Mood: Anxious, Depressed, yet improving  Affect: Congruent and Flat  Thought Process: Coherent and Logical  Orientation: Full (Time, Place, and Person)  Thought Content: Rumination, yet improving  Suicidal Thoughts: No  Homicidal Thoughts: No  Memory: Grossly intact  Judgement: Fair  Insight: Present  Psychomotor Activity: Normal  Concentration: Fair  Recall: Good  Fund of Knowledge:Fair  Language: Good  Akathisia: No  Handed: Right  AIMS (if indicated):    Assets: Communication Skills Desire for Improvement  ADL's: Intact  Cognition: WNL  Sleep: Number of Hours: 6          On 05/08/2015, I have reviewed and concur with treatment plan below, modified as follows: Pt is responding well to medications aside from loose stools as treated below:   Treatment Plan Summary: 1. Continue crisis management, mood stabilization treatments. 2. Continue current medication management to reduce current symptoms to base line and improve patient's overall level of functioning; Prolixin 5 mg for mood control, Klonopin 1 mg for severe anxiety, Hydroxyzine 50 mg for anxiety, Lamictal 250 mg for mood stabilization, Topamax 150 mg for mood stabilization & Trazodone 200 mg for insomnia.. 3. Treat health problems as indicated; resumed Qvar inhaler, Temovate ointment application, Vibra-Tabs 100 mg, Flonase spray, Heparin , flagyl 500 mg, Singulair 10 mg, Protonix 40 mg, Miralax 17 gm, phenergan 25 mg, Tramadol 50 mg for all the pre-existing medical issues presented  4. Develop treatment plan to enhance medication adeherance upon  discharge and the need for  readmission. 5. Psycho-social education regarding self care. 6. Immodium 4mg  one dose, then 2mg  q4h prn diarrhea  Beau Fanny, FNP-BC  05/08/2015, 11:30 AM

## 2015-05-09 MED ORDER — BUPIVACAINE HCL (PF) 0.5 % IJ SOLN
25.0000 mL | Freq: Every day | INTRAMUSCULAR | Status: DC | PRN
  Filled 2015-05-09: qty 30

## 2015-05-09 MED ORDER — LAMOTRIGINE 100 MG PO TABS
250.0000 mg | ORAL_TABLET | Freq: Every day | ORAL | Status: DC
  Administered 2015-05-09: 250 mg via ORAL
  Filled 2015-05-09 (×3): qty 5
  Filled 2015-05-09: qty 3

## 2015-05-09 MED ORDER — NONFORMULARY OR COMPOUNDED ITEM
40000.0000 [IU] | Freq: Every day | Status: DC | PRN
  Filled 2015-05-09: qty 1

## 2015-05-09 MED ORDER — ALUM & MAG HYDROXIDE-SIMETH 200-200-20 MG/5ML PO SUSP: 30.0000 mL | Freq: Four times a day (QID) | ORAL | Status: DC | PRN

## 2015-05-09 NOTE — [facility] Group Notes (Addendum)
[facility] Group Notes:  (Clinical Social Work)  05/09/2015  10:00-11:00AM  Summary of Progress/Problems:   Today's process group involved patients discussing their feelings related to being hospitalized during the holiday period.  Some patients were perturbed, while others said they were fine with being in the hospital, had no other options, or need the treatment.  Some patients were being discharged later today.  After a short discussion on looking for the good in being in the hospital at this time, we listened to music for the remainder of group, with patients requesting songs they particularly wanted to hear, whether holiday-themed or not.  The patient expressed her primary feeling about being hospitalized is sad, because she is going to miss Christmas for the first time with her children, who are grown and will have to leave town to return to their jobs so that they cannot simply hold off on the celebration a day or two until she is discharged.  Nonetheless, she was accepting of the process and said her children understand "this happens sometimes."  Type of Therapy:  Group Therapy - Process  Participation Level:  Active  Participation Quality:  Attentive  Affect:  Blunted and Depressed and Tearful  Cognitive:  Appropriate  Insight:  Improving  Engagement in Therapy:  Engaged  Modes of Intervention:  Exploration, Discussion  Ambrose Mantle, LCSW 05/09/2015, 12:20 PM

## 2015-05-09 NOTE — BH Assessment (Signed)
Patient happy and ready to go home. Spent most of the shift lying down. Pleasant and cooperative. Denies pain, SI, AH/VH at this time. No behavioral issues noted and no further complaints. Will continue to monitor patient for safety and stability.

## 2015-05-09 NOTE — Plan of Care (Signed)
Problem: Alteration in mood; excessive anxiety as evidenced by: Goal: STG-Pt will report an absence of self-harm thoughts/actions (Patient will report an absence of self-harm thoughts or actions)  Outcome: Progressing Client denies SI, and is safe on the unit AEB q19min safety checks, denial of self harm thoughts.

## 2015-05-09 NOTE — Progress Notes (Signed)
  [facility] Adult Case Management Discharge Plan :  Will you be returning to the same living situation after discharge:  Yes,  with significant other At discharge, do you have transportation home?: Yes,  family Do you have the ability to pay for your medications: Yes,  no barriers  Release of information consent forms completed and in the chart;  Patient's signature needed at discharge.  Patient to Follow up at: Follow-up Information    Follow up with [facility] On 05/25/2015.   Why:  at 3:00pm for medication management with Shelly Coss   Contact information:   [address] [place], [place], [address] Phone [phone] Fax: [phone]      Follow up with [facility].   Specialty:  Behavioral Health   Why:  Office will be closed through 12/26. Please call to schedule your therapy appointment with Laurell Roof   Contact information:   [address] [place] [address] [phone]       Next level of care provider has access to [facility] Link:no  Safety Planning and Suicide Prevention discussed: Yes,  with pt and with her mother  Have you used any form of tobacco in the last 30 days? (Cigarettes, Smokeless Tobacco, Cigars, and/or Pipes): No  Has patient been referred to the Quitline?: N/A patient is not a smoker  Patient has been referred for addiction treatment: Pt. refused referral as she is already in treatment, will address there as needed  Sarina Ser 05/09/2015, 1:00 PM   Late entry for [place] 05/14/15

## 2015-05-09 NOTE — Progress Notes (Signed)
[facility] MD Progress Note  05/09/2015  KORRINA ZERN  MRN:  [id number]  Subjective: Pt states: "I'm feeling much better today. The diarrhea has cleared up. My stomach feels better now. I was hoping I could leave today and I wish I had signed the 72 hour notice sooner but I also know that I could have been committed for my suicide attempt."  Objective: Pt seen and chart reviewed. Pt is alert/oriented x4, calm, cooperative, and appropriate to situation. Pt denies suicidal/homicidal ideation and psychosis and does not appear to be responding to internal stimuli. Pt reports an improvement in diarrhea today with some mild reflux symptoms (Maalox ordered). Pt reports that her anxiety and depression have improved and she would like to leave today or tomorrow. Sleep and appetite are reported to be within normal limits an doing well.   Principal Problem: Bipolar disorder, manic (HCC)  Diagnosis:   Patient Active Problem List   Diagnosis Date Noted  . Bipolar disorder, manic (HCC) [F31.10] 05/05/2015    Priority: High  . Fothergill's neuralgia [G50.0] 01/15/2014  . Rotator cuff syndrome [M75.100] 07/15/2013  . ANA positive [R76.8] 03/12/2013  . PCB (post coital bleeding) [N93.0] 01/18/2013  . Lichen sclerosus [L90.0] 12/03/2012  . Irritable bowel syndrome [K58.9] 12/03/2012  . Interstitial cystitis [N30.10] 12/03/2012  . Hypertriglyceridemia [E78.1] 10/25/2011  . Allergic rhinitis, seasonal [J30.2] 09/28/2011  . Adhesions of vagina [N89.5] 08/22/2011  . 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 Acne [L70.9] 12/21/2010  . Bipolar 1 disorder (HCC) [F31.9] 12/09/2010  . 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 Fibrositis [M79.7] 12/09/2010  . Anxiety, generalized [F41.1] 12/09/2010  . 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  Past Medical History:  Past Medical History   Diagnosis Date  . Interstitial cystitis   . Fibromyalgia   . Anxiety   . Lichen sclerosus of female genitalia   . Manic depression (HCC)   . HSV-2 infection     rare occurence  . MS (multiple sclerosis) (HCC)   . IBS (irritable bowel syndrome)   . History of self mutilation   . Arm sprain 5/11    right   . Lichen sclerosus     Vulva  . STD (sexually transmitted disease) 1989    Hx of HSV II, genital warts  . Genital warts   . Abnormal Pap smear of cervix 1997    --hx of conization of cervix by Dr. Roberto Scales  . Broken arm     left arm by elbow    Past Surgical History  Procedure Laterality Date  . Endometrial ablation  1997/1998  . Breast enhancement surgery  1990  . Cervix lesion destruction  1997    Dr. Roberto Scales  . Tonsillectomy    . Nasal sinus surgery     Family History:  Family History  Problem Relation Age of Onset  . Hypertension Mother   . Hyperlipidemia Mother   . Lung cancer Mother   . Thyroid cancer Mother   . COPD Mother   . Lung cancer Father 45  . Heart attack Father 50    3 vessel CABG  . Leukemia Maternal Grandmother   . Stroke Maternal Grandfather   . Thyroid disease Maternal Aunt   . Stroke Paternal Grandmother   . Heart failure Paternal Grandfather   . Osteoarthritis Maternal Aunt    Family Psychiatric  History: See H&P  Social History:  History  Alcohol Use  . 0.6 oz/week  . 1 Standard drinks or equivalent per week     History  Drug Use  . 1.00 per week  . Special: Marijuana    Social History   Social History  . Marital Status: Married    Spouse Name: N/A  . Number of Children: N/A  . Years of Education: N/A   Social History Main Topics  . Smoking status: Never Smoker   . Smokeless tobacco: Never Used  . Alcohol Use: 0.6 oz/week    1 Standard drinks or equivalent per week  . Drug Use: 1.00 per week    Special: Marijuana  . Sexual Activity:    Partners: Male    Pharmacist, hospital Protection: Surgical     Comment: Ablation    Other Topics Concern  . None   Social History Narrative   Additional Social History:   Sleep: Good  Appetite:  Good  Current Medications: Current Facility-Administered Medications  Medication Dose Route Frequency Provider Last Rate Last Dose  . alum & mag hydroxide-simeth (MAALOX/MYLANTA) 200-200-20 MG/5ML suspension 30 mL  30 mL Oral Q6H PRN Beau Fanny, FNP      . bupivacaine (MARCAINE) 0.5 % injection 25 mL  25 mL Infiltration Daily PRN Craige Cotta, MD       And  . Heparin 20.000 units/ ml   40,000 Units Irrigation Daily PRN Craige Cotta, MD      . clobetasol ointment (TEMOVATE) 0.05 % 1 application  1 application Topical BID Kerry Hough, PA-C   1 application at 05/09/15 [phone]  . clonazePAM (KLONOPIN) tablet 1 mg  1 mg Oral BID Sanjuana Kava, NP   1 mg at 05/09/15 0858  . fluPHENAZine (PROLIXIN) tablet 5 mg  5 mg Oral BID Sanjuana Kava, NP   5 mg at 05/09/15 0854  . fluticasone (FLONASE) 50 MCG/ACT nasal spray 2 spray  2 spray Each Nare Daily Kerry Hough, PA-C   2 spray at 05/09/15 0848  . fluticasone (FLOVENT HFA) 44 MCG/ACT inhaler 2 puff  2 puff Inhalation BID Craige Cotta, MD   2 puff at 05/08/15 2043  . heparin injection 40,000 Units  40,000 Units Subcutaneous Daily PRN Sanjuana Kava, NP      . hydrOXYzine (ATARAX/VISTARIL) tablet 50 mg  50 mg Oral QHS Kerry Hough, PA-C   50 mg at 05/06/15 2113  . lamoTRIgine (LAMICTAL) tablet 250 mg  250 mg Oral QHS Rockey Situ Cobos, MD      . loperamide (IMODIUM) capsule 2 mg  2 mg Oral Q4H PRN Beau Fanny, FNP   2 mg at 05/09/15 0654  . metroNIDAZOLE (METROGEL) 0.75 % gel 1 application  1 application Topical BID Kerry Hough, PA-C   1 application at 05/09/15 [phone]  . montelukast (SINGULAIR) tablet 10 mg  10 mg Oral BH-q7a Kerry Hough, PA-C   10 mg at 05/09/15 7829  . pantoprazole (PROTONIX) EC tablet 40 mg  40 mg Oral Daily Kerry Hough, PA-C   40 mg at 05/09/15 0855  . polyethylene glycol (MIRALAX /  GLYCOLAX) packet 17 g  17 g Oral Daily PRN Kerry Hough, PA-C      . promethazine (PHENERGAN) tablet 25 mg  25 mg Oral Q6H PRN Kerry Hough, PA-C      . saccharomyces boulardii (FLORASTOR) capsule 250 mg  250 mg Oral BID Beau Fanny, FNP   250 mg at  05/09/15 0855  . topiramate (TOPAMAX) tablet 150 mg  150 mg Oral Daily Kerry Hough, PA-C   150 mg at 05/09/15 0855  . traMADol (ULTRAM) tablet 50 mg  50 mg Oral TID PRN Sanjuana Kava, NP   50 mg at 05/09/15 0906  . traZODone (DESYREL) tablet 200 mg  200 mg Oral QHS Kerry Hough, PA-C   200 mg at 05/08/15 2131    Lab Results:  No results found for this or any previous visit (from the past 48 hour(s)).  Physical Findings: AIMS: Facial and Oral Movements Muscles of Facial Expression: None, normal Lips and Perioral Area: None, normal Jaw: None, normal Tongue: None, normal,Extremity Movements Upper (arms, wrists, hands, fingers): None, normal Lower (legs, knees, ankles, toes): None, normal, Trunk Movements Neck, shoulders, hips: None, normal, Overall Severity Severity of abnormal movements (highest score from questions above): None, normal Incapacitation due to abnormal movements: None, normal Patient's awareness of abnormal movements (rate only patient's report): No Awareness, Dental Status Current problems with teeth and/or dentures?: No Does patient usually wear dentures?: No  CIWA:  CIWA-Ar Total: 0 COWS:  COWS Total Score: 1  Musculoskeletal: Strength & Muscle Tone: within normal limits Gait & Station: normal Patient leans: N/A  Psychiatric Specialty Exam: Review of Systems  Constitutional: Negative.   HENT: Negative.   Eyes: Negative.   Respiratory: Negative.   Cardiovascular: Negative.   Gastrointestinal: Negative.   Genitourinary: Negative.   Musculoskeletal: Negative.   Skin: Negative.   Neurological: Negative.   Endo/Heme/Allergies: Negative.   Psychiatric/Behavioral: Positive for depression  ("Improving"). Negative for suicidal ideas, hallucinations, memory loss and substance abuse. The patient is nervous/anxious ("Improving") and has insomnia.   All other systems reviewed and are negative.   Blood pressure 101/58, pulse 89, temperature 98.7 F (37.1 C), temperature source Oral, resp. rate 16, height 5\' 4"  (1.626 m), weight 60.328 kg (133 lb), last menstrual period 04/12/2015.Body mass index is 22.82 kg/(m^2).  General Appearance: Casual  Eye Contact::Fair  Speech: Clear and Coherent  Volume: Normal  Mood: Anxious, about wanting to discharge tomorrow morning  Affect: Congruent and Flat  Thought Process: Coherent and Logical  Orientation: Full (Time, Place, and Person)  Thought Content: Rumination, yet improving  Suicidal Thoughts: No  Homicidal Thoughts: No  Memory: Grossly intact  Judgement: Fair  Insight: Present  Psychomotor Activity: Normal  Concentration: Fair  Recall: Good  Fund of Knowledge:Fair  Language: Good  Akathisia: No  Handed: Right  AIMS (if indicated):    Assets: Communication Skills Desire for Improvement  ADL's: Intact  Cognition: WNL  Sleep: Number of Hours: 6          On 05/09/2015, I have reviewed and concur with treatment plan below, modified as follows: Pt is responding well to medications aside from reflux as treated below:   Treatment Plan Summary: 1. Continue crisis management, mood stabilization treatments. 2. Continue current medication management to reduce current symptoms to base line and improve patient's overall level of functioning; Prolixin 5 mg for mood control, Klonopin 1 mg for severe anxiety, Hydroxyzine 50 mg for anxiety, Lamictal 250 mg for mood stabilization, Topamax 150 mg for mood stabilization & Trazodone 200 mg for insomnia.. 3. Treat health problems as indicated; resumed Qvar inhaler, Temovate ointment application, Vibra-Tabs 100 mg, Flonase spray, Heparin , flagyl 500 mg,  Singulair 10 mg, Protonix 40 mg, Miralax 17 gm, phenergan 25 mg, Tramadol 50 mg for all the pre-existing medical issues presented  4. Develop treatment  plan to enhance medication adeherance upon discharge and the need for  readmission. 5. Psycho-social education regarding self care. 6. Immodium 2mg  q4h prn diarrhea 7. Maalox 30ml q6h prn reflux symptoms  Beau Fanny, FNP-BC  05/09/2015, 1:30 PM

## 2015-05-10 MED ORDER — TRAZODONE HCL 100 MG PO TABS: 200.0000 mg | ORAL_TABLET | Freq: Every day | ORAL | Status: DC

## 2015-05-10 MED ORDER — MONTELUKAST SODIUM 10 MG PO TABS: 10.0000 mg | ORAL_TABLET | ORAL | Status: DC

## 2015-05-10 MED ORDER — SACCHAROMYCES BOULARDII 250 MG PO CAPS: 250.0000 mg | ORAL_CAPSULE | Freq: Two times a day (BID) | ORAL | Status: DC

## 2015-05-10 MED ORDER — POLYETHYLENE GLYCOL 3350 17 G PO PACK: 17.0000 g | PACK | Freq: Every day | ORAL | Status: DC | PRN

## 2015-05-10 MED ORDER — HEPARIN SODIUM (PORCINE) 10000 UNIT/ML IJ SOLN: INTRAMUSCULAR | Status: DC

## 2015-05-10 MED ORDER — OMEPRAZOLE 40 MG PO CPDR: 40.0000 mg | DELAYED_RELEASE_CAPSULE | Freq: Every day | ORAL | Status: DC

## 2015-05-10 MED ORDER — METRONIDAZOLE 0.75 % EX GEL: 1.0000 "application " | Freq: Two times a day (BID) | CUTANEOUS | Status: DC

## 2015-05-10 MED ORDER — LAMOTRIGINE 100 MG PO TABS: 250.0000 mg | ORAL_TABLET | Freq: Every day | ORAL | Status: DC

## 2015-05-10 MED ORDER — TOPIRAMATE 50 MG PO TABS: 150.0000 mg | ORAL_TABLET | Freq: Every day | ORAL | Status: DC

## 2015-05-10 MED ORDER — QVAR 40 MCG/ACT IN AERS: 2.0000 | INHALATION_SPRAY | Freq: Two times a day (BID) | RESPIRATORY_TRACT | Status: DC

## 2015-05-10 MED ORDER — FLUTICASONE PROPIONATE 50 MCG/ACT NA SUSP: 2.0000 | Freq: Every day | NASAL | Status: DC

## 2015-05-10 MED ORDER — HYDROXYZINE HCL 50 MG PO TABS: 50.0000 mg | ORAL_TABLET | Freq: Every day | ORAL | Status: DC

## 2015-05-10 MED ORDER — CLOBETASOL PROPIONATE 0.05 % EX OINT: 1.0000 "application " | TOPICAL_OINTMENT | Freq: Two times a day (BID) | CUTANEOUS | Status: DC

## 2015-05-10 MED ORDER — CLONAZEPAM 1 MG PO TABS: 1.0000 mg | ORAL_TABLET | Freq: Two times a day (BID) | ORAL | Status: DC

## 2015-05-10 MED ORDER — FLUPHENAZINE HCL 5 MG PO TABS: 5.0000 mg | ORAL_TABLET | Freq: Two times a day (BID) | ORAL | Status: DC

## 2015-05-10 NOTE — Progress Notes (Signed)
Erika Mccoy is readied fpr DC today after she is seen by the [facility] [place], dc order is written and then she completed her daily assessment. On assessment she wrote she denied SI today and she rated her depression, hopelessness and anxiety " 0/0/2", respectively. DC AVS is reviewed with her, she is given sample meds and prescriptions from NP and then all belongings locked in locker are returned to her per policy. She is escorted to bldg entrance and dc'd per MD .

## 2015-05-10 NOTE — [facility] Suicide Risk Assessment (Signed)
[facility] Discharge Suicide Risk Assessment   Demographic Factors:  Caucasian  Total Time spent with patient: 30 minutes  Musculoskeletal: Strength & Muscle Tone: within normal limits Gait & Station: normal Patient leans: N/A  Psychiatric Specialty Exam: Physical Exam  Review of Systems  Psychiatric/Behavioral: Negative for depression and suicidal ideas.  All other systems reviewed and are negative.   Blood pressure 94/67, pulse 106, temperature 98.6 F (37 C), temperature source Oral, resp. rate 14, height  (1.626 m), weight 60.328 kg (133 lb), last menstrual period 04/12/2015.Body mass index is 22.82 kg/(m^2).  General Appearance: Casual  Eye Contact::  Fair  Speech:  Clear and Coherent409  Volume:  Normal  Mood:  Euthymic  Affect:  Congruent  Thought Process:  Coherent  Orientation:  Full (Time, Place, and Person)  Thought Content:  WDL  Suicidal Thoughts:  No  Homicidal Thoughts:  No  Memory:  Immediate;   Fair Recent;   Fair Remote;   Fair  Judgement:  Fair  Insight:  Fair  Psychomotor Activity:  Normal  Concentration:  Fair  Recall:  [employer] of Knowledge:Fair  Language: Fair  Akathisia:  No  Handed:  Right  AIMS (if indicated):     Assets:  Communication Skills Desire for Improvement  Sleep:  Number of Hours: 7  Cognition: WNL  ADL's:  Intact   Have you used any form of tobacco in the last 30 days? (Cigarettes, Smokeless Tobacco, Cigars, and/or Pipes): No  Has this patient used any form of tobacco in the last 30 days? (Cigarettes, Smokeless Tobacco, Cigars, and/or Pipes) No  Mental Status Per Nursing Assessment::   On Admission:     Current Mental Status by Physician: pt denies SI/HI/AH/VH  Loss Factors: NA  Historical Factors: NA  Risk Reduction Factors:   Living with another person, especially a relative and Positive social support  Continued Clinical Symptoms:  Previous Psychiatric Diagnoses and Treatments  Cognitive Features That  Contribute To Risk:  None    Suicide Risk:  Minimal: No identifiable suicidal ideation.  Patients presenting with no risk factors but with morbid ruminations; may be classified as minimal risk based on the severity of the depressive symptoms  Principal Problem: Bipolar disorder, manic [facility]) Discharge Diagnoses:  Patient Active Problem List   Diagnosis Date Noted  . Bipolar disorder, manic (HCC) [F31.10] 05/05/2015  . Fothergill's neuralgia [G50.0] 01/15/2014  . Rotator cuff syndrome [M75.100] 07/15/2013  . ANA positive [R76.8] 03/12/2013  . PCB (post coital bleeding) [N93.0] 01/18/2013  . Lichen sclerosus [L90.0] 12/03/2012  . Irritable bowel syndrome [K58.9] 12/03/2012  . Interstitial cystitis [N30.10] 12/03/2012  . Hypertriglyceridemia [E78.1] 10/25/2011  . Allergic rhinitis, seasonal [J30.2] 09/28/2011  . Adhesions of vagina [N89.5] 08/22/2011  . 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 Acne [L70.9] 12/21/2010  . Bipolar 1 disorder (HCC) [F31.9] 12/09/2010  . 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 Fibrositis [M79.7] 12/09/2010  . Anxiety, generalized [F41.1] 12/09/2010  . 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   Why:  at 3:00pm for medication management with Shelly Coss   Contact information:   [address] [place], [place], [address] Phone [phone] Fax: [phone]      Follow up with [facility].   Specialty:  Behavioral Health   Why:  Office will be closed through 12/26. Please call to schedule your therapy appointment with Laurell Roof   Contact information:  [address] [place] [address] [phone]       Plan Of Care/Follow-up recommendations:  Activity:  NO RESTRICTIONS Diet:  REGULAR Tests:  AS NEEDED Other:  FOLLOW UP WITH AFTER CARE  Is patient on multiple antipsychotic therapies at  discharge:  No   Has Patient had three or more failed trials of antipsychotic monotherapy by history:  No  Recommended Plan for Multiple Antipsychotic Therapies: NA    Eldin Bonsell md 05/10/2015, 8:59 AM

## 2015-05-10 NOTE — Discharge Summary (Signed)
Physician Discharge Summary Note  Patient:  Erika Mccoy is an 49 y.o., female MRN:  [id number] DOB:  May 07, 1966 Patient phone:  [phone] (home)  Patient address:   [address] [place] [address],  Total Time spent with patient: 45 minutes  Date of Admission:  05/05/2015 Date of Discharge: 05/15/2015  Reason for Admission:   Erika Mccoy is a 49 year old Caucasian female. Admitted to [facility] from the [facility] ED with complaints of worsening symptoms of Bipolar disorder leading to suicide attempt by wrist cutting. During this assessment, she reports, "The ambulance took me to the ED yesterday. My mother called the EMS. Yesterday, I felt very strange, decided to take my dog to my bathroom with my music. Undressed myself, calmly went into the bath tub, ran the water on my wrist, started cutting, but the knife was dull. I thought about getting a sharp knife, that was what brought me to reality again, I called my mother instead. My mental health is detoriating. I take my medications as prescribed/recommended. I see my therapist as scheduled. My primary psychiatrist has informed me that my manic depression is very atypical. The psychotropic medications can only take away the mania, never the depression. During my manic phase; I have bout of stupidity, high energy, indiscretion with sex & highly creative. This has been on since 2004 when I was first diagnosed with Bipolar mania & was hospitalized in this hospital. I was once or twice on antipsychotics, felt better for several years, but gained a lot of weight that got me very depressed & uncomfortable with my body image. I developed eating disorder & lost a lot of weight. However, recently, I have been managing my wight by eating responsibly combined with portion control. I do believe that I need an antipsychotic medicine to help me stop cutting on on myself. This is the first time that I have tried to cut on my wrists. I usually cut on my  ankle since 2004. I use to burn myself as well, but not of recent. I refuse to take Seroquel, Prolixin or any of them that will make me gain weight".  Erika Mccoy is alert, oriented x 4. Appears stated age. She is verbally responsive, a good historian with poor eye contact. She reports long history or mental illness, mania, reckless behavior, inappropriate relationship with deceased father at a young age. She reports history of dissociative disorder whereby she sees the real outside world & trees appear unreal, took on a plastic quality, people appear odd. She felt like she is not a part of the world or what is going on. She feels separated from reality. She says she almost must have someone to bring her back to reality. Erika Mccoy has agreed on Haldol or any antipsychotic that will not make her gain weight.  Principal Problem: Bipolar disorder, manic [facility]) Discharge Diagnoses: Patient Active Problem List   Diagnosis Date Noted  . Bipolar disorder, manic (HCC) [F31.10] 05/05/2015    Priority: High  . Fothergill's neuralgia [G50.0] 01/15/2014  . Rotator cuff syndrome [M75.100] 07/15/2013  . ANA positive [R76.8] 03/12/2013  . PCB (post coital bleeding) [N93.0] 01/18/2013  . Lichen sclerosus [L90.0] 12/03/2012  . Irritable bowel syndrome [K58.9] 12/03/2012  . Interstitial cystitis [N30.10] 12/03/2012  . Hypertriglyceridemia [E78.1] 10/25/2011  . Allergic rhinitis, seasonal [J30.2] 09/28/2011  . Adhesions of vagina [N89.5] 08/22/2011  . 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 Acne [L70.9] 12/21/2010  . Bipolar 1 disorder (HCC) [F31.9] 12/09/2010  .  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 Fibrositis [M79.7] 12/09/2010  . Anxiety, generalized [F41.1] 12/09/2010  . 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 Interstitial cystitis   . Fibromyalgia   . Anxiety   . Lichen sclerosus of  female genitalia   . Manic depression (HCC)   . HSV-2 infection     rare occurence  . MS (multiple sclerosis) (HCC)   . IBS (irritable bowel syndrome)   . History of self mutilation   . Arm sprain 5/11    right   . Lichen sclerosus     Vulva  . STD (sexually transmitted disease) 1989    Hx of HSV II, genital warts  . Genital warts   . Abnormal Pap smear of cervix 1997    --hx of conization of cervix by Dr. Roberto Scales  . Broken arm     left arm by elbow    Past Surgical History  Procedure Laterality Date  . Endometrial ablation  1997/1998  . Breast enhancement surgery  1990  . Cervix lesion destruction  1997    Dr. Roberto Scales  . Tonsillectomy    . Nasal sinus surgery     Family History:  Family History  Problem Relation Age of Onset  . Hypertension Mother   . Hyperlipidemia Mother   . Lung cancer Mother   . Thyroid cancer Mother   . COPD Mother   . Lung cancer Father 29  . Heart attack Father 50    3 vessel CABG  . Leukemia Maternal Grandmother   . Stroke Maternal Grandfather   . Thyroid disease Maternal Aunt   . Stroke Paternal Grandmother   . Heart failure Paternal Grandfather   . Osteoarthritis Maternal Aunt    Family Psychiatric  History: See H&P  Social History:  History  Alcohol Use  . 0.6 oz/week  . 1 Standard drinks or equivalent per week     History  Drug Use  . 1.00 per week  . Special: Marijuana    Social History   Social History  . Marital Status: Married    Spouse Name: N/A  . Number of Children: N/A  . Years of Education: N/A   Social History Main Topics  . Smoking status: Never Smoker   . Smokeless tobacco: Never Used  . Alcohol Use: 0.6 oz/week    1 Standard drinks or equivalent per week  . Drug Use: 1.00 per week    Special: Marijuana  . Sexual Activity:    Partners: Male    Pharmacist, hospital Protection: Surgical     Comment: Ablation   Other Topics Concern  . None   Social History Narrative    Hospital Course:   Erika Mccoy was admitted for Bipolar disorder, manic (HCC) and crisis management.  Pt was treated discharged with the medications listed below under Medication List.  Medical problems were identified and treated as needed.  Home medications were restarted as appropriate.  Improvement was monitored by observation and Laurance Flatten 's daily report of symptom reduction.  Emotional and mental status was monitored by daily self-inventory reports completed by Laurance Flatten and clinical staff.         Lissandra Keil Comer was evaluated by the treatment team for stability and plans for continued recovery upon discharge. YISEL MEGILL 's motivation was an integral factor for scheduling further treatment. Employment, transportation, bed  availability, health status, family support, and any pending legal issues were also considered during hospital stay. Pt was offered further treatment options upon discharge including but not limited to Residential, Intensive Outpatient, and Outpatient treatment.  QUINA WILBOURNE will follow up with the services as listed below under Follow Up Information.     Upon completion of this admission the patient was both mentally and medically stable for discharge denying suicidal/homicidal ideation, auditory/visual/tactile hallucinations, delusional thoughts and paranoia.    Zannie Kehr Comer responded well to treatment with Lamictal, Vistaril, Klonopin, Prolixin, Topamax, and Trazodone without adverse effects. Pt demonstrated improvement without reported or observed adverse effects to the point of stability appropriate for outpatient management. Pertinent labs include: UDS + for benzo/opiates , in 28 (high), Hgb A1C (high), for which outpatient follow-up is necessary for lab recheck as mentioned below. Reviewed CBC, CMP, BAL, and UDS; all unremarkable aside from noted exceptions.   Physical Findings: AIMS: Facial and Oral Movements Muscles of Facial Expression: None, normal Lips  and Perioral Area: None, normal Jaw: None, normal Tongue: None, normal,Extremity Movements Upper (arms, wrists, hands, fingers): None, normal Lower (legs, knees, ankles, toes): None, normal, Trunk Movements Neck, shoulders, hips: None, normal, Overall Severity Severity of abnormal movements (highest score from questions above): None, normal Incapacitation due to abnormal movements: None, normal Patient's awareness of abnormal movements (rate only patient's report): No Awareness, Dental Status Current problems with teeth and/or dentures?: No Does patient usually wear dentures?: No  CIWA:  CIWA-Ar Total: 0 COWS:  COWS Total Score: 1  Musculoskeletal: Strength & Muscle Tone: within normal limits Gait & Station: normal Patient leans: N/A  Psychiatric Specialty Exam: Review of Systems  Psychiatric/Behavioral: Positive for depression. Negative for suicidal ideas. The patient is nervous/anxious.   All other systems reviewed and are negative.   Blood pressure 94/67, pulse 106, temperature 98.6 F (37 C), temperature source Oral, resp. rate 14, height  (1.626 m), weight 60.328 kg (133 lb), last menstrual period 04/12/2015.Body mass index is 22.82 kg/(m^2).  SEE MD PSE within the SRA   Have you used any form of tobacco in the last 30 days? (Cigarettes, Smokeless Tobacco, Cigars, and/or Pipes): No  Has this patient used any form of tobacco in the last 30 days? (Cigarettes, Smokeless Tobacco, Cigars, and/or Pipes) Yes, No  Metabolic Disorder Labs:  Lab Results  Component Value Date   HGBA1C 5.7* 08/22/2014   MPG 117* 08/22/2014   Lab Results  Component Value Date   PROLACTIN 7.6 06/18/2013   No results found for: CHOL, TRIG, HDL, CHOLHDL, VLDL, LDLCALC  See Psychiatric Specialty Exam and Suicide Risk Assessment completed by Attending Physician prior to discharge.  Discharge destination:  Home  Is patient on multiple antipsychotic therapies at discharge:  No   Has Patient had  three or more failed trials of antipsychotic monotherapy by history:  No  Recommended Plan for Multiple Antipsychotic Therapies: NA     Medication List    STOP taking these medications        acetaminophen-codeine 300-30 MG tablet  Commonly known as:  TYLENOL #3     doxycycline 100 MG capsule  Commonly known as:  VIBRAMYCIN     metroNIDAZOLE 500 MG tablet  Commonly known as:  FLAGYL      TAKE these medications      Indication   bupivacaine 0.5 % injection  Commonly known as:  MARCAINE  Instill 15 cc into bladder daily      clobetasol ointment  0.05 %  Commonly known as:  TEMOVATE  Apply 1 application topically 2 (two) times daily.   Indication:  Inflammation     clonazePAM 1 MG tablet  Commonly known as:  KLONOPIN  Take 1 tablet (1 mg total) by mouth 2 (two) times daily.   Indication:  anxiety     fluPHENAZine 5 MG tablet  Commonly known as:  PROLIXIN  Take 1 tablet (5 mg total) by mouth 2 (two) times daily.   Indication:  Mood control     fluticasone 50 MCG/ACT nasal spray  Commonly known as:  FLONASE  Place 2 sprays into both nostrils daily.   Indication:  Signs and Symptoms of Nose Diseases     heparin 13086 UNIT/ML injection  Instill 4 cc into bladder daily   Indication:  bladder irrigation     hydrOXYzine 50 MG tablet  Commonly known as:  ATARAX/VISTARIL  Take 1 tablet (50 mg total) by mouth at bedtime.   Indication:  Anxiety Neurosis     lamoTRIgine 100 MG tablet  Commonly known as:  LAMICTAL  Take 2.5 tablets (250 mg total) by mouth at bedtime.   Indication:  mood stabilization     metroNIDAZOLE 0.75 % gel  Commonly known as:  METROGEL  Apply 1 application topically 2 (two) times daily.   Indication:  skin breakdown     montelukast 10 MG tablet  Commonly known as:  SINGULAIR  Take 1 tablet (10 mg total) by mouth every morning.   Indication:  Hayfever     omeprazole 40 MG capsule  Commonly known as:  PRILOSEC  Take 1 capsule (40 mg total)  by mouth daily.   Indication:  Gastroesophageal Reflux Disease     polyethylene glycol packet  Commonly known as:  MIRALAX / GLYCOLAX  Take 17 g by mouth daily as needed for mild constipation.   Indication:  Constipation     promethazine 25 MG tablet  Commonly known as:  PHENERGAN  Take 25 mg by mouth every 6 (six) hours as needed for nausea or vomiting.      QVAR 40 MCG/ACT inhaler  Generic drug:  beclomethasone  Inhale 2 puffs into the lungs 2 (two) times daily.   Indication:  Asthma     saccharomyces boulardii 250 MG capsule  Commonly known as:  FLORASTOR  Take 1 capsule (250 mg total) by mouth 2 (two) times daily.   Indication:  probiotics     topiramate 50 MG tablet  Commonly known as:  TOPAMAX  Take 3 tablets (150 mg total) by mouth daily.   Indication:  Migraine Headache     traZODone 100 MG tablet  Commonly known as:  DESYREL  Take 2 tablets (200 mg total) by mouth at bedtime.   Indication:  Trouble Sleeping           Follow-up Information    Follow up with [facility] On 05/25/2015.   Why:  at 3:00pm for medication management with Shelly Coss   Contact information:   [address] [place], [place], [address] Phone [phone] Fax: [phone]      Follow up with [facility].   Specialty:  Behavioral Health   Why:  Office will be closed through 12/26. Please call to schedule your therapy appointment with [facility] information:   [address] [place] [address] [phone]       Follow-up recommendations:  Activity:  As tolerated  Diet:  Heart healthy with low sodium  Comments:   Take all medications as prescribed. Keep all follow-up appointments as scheduled.  Do not consume alcohol or use illegal drugs while on prescription medications. Report any adverse effects from your medications to your primary care provider promptly.  In the event of recurrent  symptoms or worsening symptoms, call 911, a crisis hotline, or go to the nearest emergency department for evaluation.   Signed: Beau Fanny, FNP-BC 05/10/2015, 9:54 AM

## 2015-05-22 ENCOUNTER — Telehealth: Payer: Self-pay | Admitting: Obstetrics and Gynecology

## 2015-05-22 MED ORDER — FLUCONAZOLE 150 MG PO TABS: 150.0000 mg | ORAL_TABLET | Freq: Once | ORAL | Status: DC

## 2015-05-22 NOTE — Telephone Encounter (Signed)
Spoke with patient. Patient states that she is currently taking Doxycyline and Flagyl for treatment of PID. Patient began to have vaginal discharge and itching yesterday. States is requesting an rx for Diflucan be called in to the pharmacy. "I do not want to be probed any more. I do not want anyone to touch me. If I can not get the Diflucan I am not coming in. I will have a yeast infection for the rest of my life. I have recently been in the hospital and it is all too much right now." Advised I will speak with Dr.Silva and return call with further recommendations. Patient is agreeable.

## 2015-05-22 NOTE — Telephone Encounter (Signed)
Return call to Kaitlyn. °

## 2015-05-22 NOTE — Telephone Encounter (Signed)
Patient has been taking several antibiotics and now has a yeast infection. Patient is not available to come in for an appointment. Patient said "I have been in several times recently and I know that I have a yeast infection". Patient is asking for diflucan to be called to the pharmacy on file. Patient last seen 05/04/15.

## 2015-05-22 NOTE — Telephone Encounter (Signed)
Ok for Dilfucan 150 mg po x 1.  May repeat in 48 hour.  Please sent to pharmacy of choice. Keep appointment for recheck on 05/25/15.

## 2015-05-22 NOTE — Telephone Encounter (Signed)
Left message for patient to call Layann Bluett at [phone]. Need more information and triage.  Patient was treated for PID with Doxycycline and Flagyl on 05/01/2015. Patient was prescribed Metrogel on 05/10/2015 by Constance Haw, FNP.

## 2015-05-22 NOTE — Telephone Encounter (Signed)
Order for Diflucan 150 mg po x 1 #2 0RF sent to pharmacy on file. Patient notified.  Routing to provider for final review. Patient agreeable to disposition. Will close encounter.

## 2015-05-25 ENCOUNTER — Ambulatory Visit: Payer: BLUE CROSS/BLUE SHIELD | Admitting: Obstetrics and Gynecology

## 2015-06-04 ENCOUNTER — Other Ambulatory Visit: Payer: Self-pay

## 2015-06-04 DIAGNOSIS — Z1231 Encounter for screening mammogram for malignant neoplasm of breast: Secondary | ICD-10-CM

## 2015-06-08 ENCOUNTER — Telehealth: Payer: Self-pay | Admitting: Obstetrics and Gynecology

## 2015-06-08 ENCOUNTER — Ambulatory Visit: Payer: BLUE CROSS/BLUE SHIELD | Admitting: Obstetrics and Gynecology

## 2015-06-08 NOTE — Telephone Encounter (Signed)
Patient left a message on the answering machine yesterday stating she would need to cancel her aex appointment today. Patient said she has a URI and thinks she may have a stomach virus. Patient will call later to reschedule.

## 2015-06-08 NOTE — Telephone Encounter (Signed)
Thank you for the update!

## 2015-06-22 ENCOUNTER — Encounter: Payer: Self-pay | Admitting: Nurse Practitioner

## 2015-06-22 ENCOUNTER — Ambulatory Visit (INDEPENDENT_AMBULATORY_CARE_PROVIDER_SITE_OTHER): Payer: BLUE CROSS/BLUE SHIELD | Admitting: Nurse Practitioner

## 2015-06-22 VITALS — BP 100/64 | HR 64 | Ht 64.75 in | Wt 138.0 lb

## 2015-06-22 DIAGNOSIS — Z0000 Encounter for general adult medical examination without abnormal findings: Secondary | ICD-10-CM

## 2015-06-22 DIAGNOSIS — Z01419 Encounter for gynecological examination (general) (routine) without abnormal findings: Secondary | ICD-10-CM | POA: Diagnosis not present

## 2015-06-22 LAB — HEMOGLOBIN A1C
HEMOGLOBIN A1C: 5.4 % (ref <5.7)
Mean Plasma Glucose: 108 mg/dL (ref <117)

## 2015-06-22 NOTE — Patient Instructions (Signed)

## 2015-06-22 NOTE — Progress Notes (Signed)
Patient ID: Erika Mccoy, female   DOB: July 13, 1965, 50 y.o.   MRN: [id number]  50 y.o. G2P2 Married  Caucasian Fe here for annual exam.  Will be having the [employer] laser for LSA at [facility] as part of trial and at no charge.  Series of 3 treatments and first will be 06/25/15.  Went to [facility] 12/20- 12/25.  Cutting was getting worse - none since this past hospital stay.  While she was in [place] this summer the cutting was also bad and had to have sutures several times.  States the relationship with husband is better.  Patient's last menstrual period was 05/31/2015 (approximate).          Sexually active: Yes.    The current method of family planning is ablation.    Exercising: No.  The patient does not participate in regular exercise at present. Smoker:  no  Health Maintenance: Pap:  10/26/11, negative with neg HR HPV MMG:  07/23/2014 Birads 1 Colonoscopy:  2007 & 2010 TDaP:  10/15/2014  HIV: 07/25/2014 Labs: PCP   reports that she has never smoked. She has never used smokeless tobacco. She reports that she drinks about 0.6 oz of alcohol per week. She reports that she uses illicit drugs (Marijuana) about once per week.  Past Medical History  Diagnosis Date  . Interstitial cystitis   . Fibromyalgia   . Anxiety   . Lichen sclerosus of female genitalia   . Manic depression (HCC)   . HSV-2 infection     rare occurence  . MS (multiple sclerosis) (HCC)   . IBS (irritable bowel syndrome)   . History of self mutilation   . Arm sprain 5/11    right   . Lichen sclerosus     Vulva  . STD (sexually transmitted disease) 1989    Hx of HSV II, genital warts  . Genital warts   . Abnormal Pap smear of cervix 1997    --hx of conization of cervix by Dr. Roberto Scales  . Broken arm     left arm by elbow    Past Surgical History  Procedure Laterality Date  . Endometrial ablation  1997/1998  . Breast enhancement surgery  1990  . Cervix lesion destruction  1997    Dr. Roberto Scales  . Tonsillectomy    .  Nasal sinus surgery      Current Outpatient Prescriptions  Medication Sig Dispense Refill  . bupivacaine (MARCAINE) 0.5 % injection Instill 15 cc into bladder daily    . clobetasol ointment (TEMOVATE) 0.05 % Apply 1 application topically 2 (two) times daily. 60 g 0  . clonazePAM (KLONOPIN) 1 MG tablet Take 1 tablet (1 mg total) by mouth 2 (two) times daily.    . heparin 78295 UNIT/ML injection Instill 4 cc into bladder daily 5 mL 0  . hydrOXYzine (ATARAX/VISTARIL) 25 MG tablet Take 2 tablets by mouth at bedtime.  0  . lamoTRIgine (LAMICTAL) 200 MG tablet Take 1 tablet by mouth daily.  2  . montelukast (SINGULAIR) 10 MG tablet Take 1 tablet (10 mg total) by mouth every morning.    Marland Kitchen omeprazole (PRILOSEC) 40 MG capsule Take 1 capsule (40 mg total) by mouth daily.    . polyethylene glycol (MIRALAX / GLYCOLAX) packet Take 17 g by mouth daily as needed for mild constipation. 14 each 0  . QVAR 40 MCG/ACT inhaler Inhale 2 puffs into the lungs 2 (two) times daily. 1 Inhaler 5  . topiramate (TOPAMAX) 50  MG tablet Take 3 tablets (150 mg total) by mouth daily.  1  . traMADol (ULTRAM) 50 MG tablet Take 50 mg by mouth 3 (three) times daily as needed.    . traZODone (DESYREL) 100 MG tablet Take 2 tablets (200 mg total) by mouth at bedtime. 60 tablet 0   No current facility-administered medications for this visit.    Family History  Problem Relation Age of Onset  . Hypertension Mother   . Hyperlipidemia Mother   . Lung cancer Mother   . Thyroid cancer Mother   . COPD Mother   . Lung cancer Father 49  . Heart attack Father 50    3 vessel CABG  . Leukemia Maternal Grandmother   . Stroke Maternal Grandfather   . Thyroid disease Maternal Aunt   . Stroke Paternal Grandmother   . Heart failure Paternal Grandfather   . Osteoarthritis Maternal Aunt     ROS:  Pertinent items are noted in HPI.  Otherwise, a comprehensive ROS was negative.  Exam:   BP 100/64 mmHg  Pulse 64  Ht 5' 4.75" (1.645 m)   Wt 138 lb (62.596 kg)  BMI 23.13 kg/m2  LMP 05/31/2015 (Approximate) Height: 5' 4.75" (164.5 cm) Ht Readings from Last 3 Encounters:  06/22/15 5' 4.75" (1.645 m)  05/05/15  (1.626 m)  05/04/15  (1.626 m)    General appearance: alert, cooperative and appears stated age Head: Normocephalic, without obvious abnormality, atraumatic Neck: no adenopathy, supple, symmetrical, trachea midline and thyroid normal to inspection and palpation Lungs: clear to auscultation bilaterally Breasts: normal appearance, no masses or tenderness, positive findings: implants bilaterally Heart: regular rate and rhythm Abdomen: soft, non-tender; no masses,  no organomegaly Extremities: extremities normal, atraumatic, no cyanosis or edema Skin: Skin color, texture, turgor normal. No rashes or lesions Lymph nodes: Cervical, supraclavicular, and axillary nodes normal. No abnormal inguinal nodes palpated Neurologic: Grossly normal   Pelvic: External genitalia:  no lesions              Urethra:  normal appearing urethra with no masses, tenderness or lesions              Bartholin's and Skene's: normal                 Vagina: normal appearing vagina with normal color and discharge, no lesions              Cervix: anteverted              Pap taken: Yes.   Bimanual Exam:  Uterus:  normal size, contour, position, consistency, mobility, non-tender              Adnexa: no mass, fullness, tenderness               Rectovaginal: Confirms               Anus:  normal sphincter tone, no lesions  Chaperone present: yes  A:  Well Woman with normal exam  Perimenopausal   S/P Endometrial ablation 1997  S/P breast saline augmentation 1990  History of Bipolar with self mutualization - recent [facility] admission 04/2015  History of LSA, IC, IBS  History of M.S., connective tissue disorders, fibromyalgia  History of HSV II - culture proven 1989  Remote abnormal pap with conization 1997  History of  dyspareunia and yeast infections   P:   Reviewed health and wellness pertinent to exam  Pap smear as above  Mammogram  is due 07/2015 - but is scheduled for 06/2015  Will follow with Vit D and HGB AIC  Counseled on breast self exam, mammography screening, adequate intake of calcium and vitamin D, diet and exercise return annually or prn  An After Visit Summary was printed and given to the patient.

## 2015-06-23 ENCOUNTER — Other Ambulatory Visit: Payer: Self-pay | Admitting: Nurse Practitioner

## 2015-06-23 LAB — VITAMIN D 25 HYDROXY (VIT D DEFICIENCY, FRACTURES): Vit D, 25-Hydroxy: 15 ng/mL — ABNORMAL LOW (ref 30–100)

## 2015-06-23 MED ORDER — VITAMIN D (ERGOCALCIFEROL) 1.25 MG (50000 UNIT) PO CAPS: 50000.0000 [IU] | ORAL_CAPSULE | ORAL | Status: DC

## 2015-06-24 NOTE — Progress Notes (Signed)
Encounter reviewed by Dr. Brook Amundson C. Silva.  

## 2015-06-25 ENCOUNTER — Ambulatory Visit: Payer: BLUE CROSS/BLUE SHIELD | Admitting: Certified Nurse Midwife

## 2015-06-26 ENCOUNTER — Ambulatory Visit: Payer: Self-pay

## 2015-06-26 LAB — IPS PAP TEST WITH HPV

## 2015-06-29 ENCOUNTER — Ambulatory Visit: Payer: BLUE CROSS/BLUE SHIELD | Admitting: Obstetrics and Gynecology

## 2015-06-29 ENCOUNTER — Encounter: Payer: Self-pay | Admitting: Obstetrics & Gynecology

## 2015-06-29 ENCOUNTER — Ambulatory Visit (INDEPENDENT_AMBULATORY_CARE_PROVIDER_SITE_OTHER): Payer: BLUE CROSS/BLUE SHIELD | Admitting: Obstetrics & Gynecology

## 2015-06-29 VITALS — BP 110/60 | HR 84 | Temp 97.7°F | Ht 64.75 in | Wt 140.0 lb

## 2015-06-29 DIAGNOSIS — N898 Other specified noninflammatory disorders of vagina: Secondary | ICD-10-CM

## 2015-06-29 DIAGNOSIS — L298 Other pruritus: Secondary | ICD-10-CM

## 2015-06-29 MED ORDER — FLUCONAZOLE 150 MG PO TABS: ORAL_TABLET | ORAL | Status: DC

## 2015-06-29 NOTE — Progress Notes (Signed)
Subjective:     Patient ID: Erika Mccoy, female   DOB: August 15, 1965, 50 y.o.   MRN: [id number]  HPI 50 yo G2P2 MWF here for complaint of vaginal and vulvar itching.  Pt reports having Drue Stager laser for [employer] trial at [facility].  This was done last Thursday.  Pt reports the procedure went really well but she had a lot of pain and burning for 12 hours after the procedure.  After that time, tramadol and benadryl helped after that point.    Denies vaginal bleeding.  She's had a little pink discharge since the [facility] procedure.  She now reports thicker discharge with worsening itching.   Next procedure is scheduled for six weeks from the last one.      H/O recurrent yeast infections.  Several +affirm tests over the last few years.  Occasionally testing is positive for BV.  Review of Systems  Genitourinary: Positive for vaginal discharge.       Objective:   Physical Exam  Constitutional: She is oriented to person, place, and time. She appears well-developed and well-nourished.  Abdominal: Soft. Bowel sounds are normal.  Genitourinary: There is no rash, tenderness, lesion or injury on the right labia. There is no rash, tenderness, lesion or injury on the left labia. Vaginal discharge (whitish) found.  Small petechia on inner labia majora, minora and in vagina  Lymphadenopathy:       Right: No inguinal adenopathy present.       Left: No inguinal adenopathy present.  Neurological: She is alert and oriented to person, place, and time.  Skin: Skin is warm and dry.  Psychiatric: She has a normal mood and affect.       Assessment:     Vaginal itching  S/p laser treatment with Drue Stager for LSA    Plan:     Diflucan 150mg  po every other day for 3 doses  May need to treat with vaginal estrogen but will wait for affirm results

## 2015-06-30 LAB — WET PREP BY MOLECULAR PROBE
Candida species: NEGATIVE
Gardnerella vaginalis: NEGATIVE
Trichomonas vaginosis: NEGATIVE

## 2015-07-02 ENCOUNTER — Telehealth: Payer: Self-pay | Admitting: *Deleted

## 2015-07-02 NOTE — Telephone Encounter (Signed)
Call to patient, left message to call back. Ask for triage nurse. 

## 2015-07-02 NOTE — Telephone Encounter (Signed)
-----   Message from Jerene Bears, MD sent at 07/01/2015  8:07 AM EST ----- Please let pt know that the affirm was negative for both yeast and BV.  I think her symptoms are coming from the [place] laser treatment she had last week.  I would use coconut oil (OTC) vaginally daily for moisture and nothing else.  Please get update from her.  Thanks.

## 2015-07-02 NOTE — Telephone Encounter (Signed)
Spoke with patient. Advised of message as seen below from Dr.Miller. She is agreeable and verbalizes understanding. States that she is still having some vaginal itching and burning, but it has decreased since she was seen in the office. Reports she feels it is getting better. Advised she may use coconut oil (OTC) vaginally daily for moisture and nothing else. She is agreeable and will return call if symptoms increase or persist.  Routing to provider for final review. Patient agreeable to disposition. Will close encounter.

## 2015-08-06 ENCOUNTER — Telehealth: Payer: Self-pay | Admitting: Obstetrics & Gynecology

## 2015-08-06 NOTE — Telephone Encounter (Signed)
Spoke with patient. Patient states that for 1 to 1 1/2 years she has been experiencing hair loss. Reports this started around the time she started taking Topamax. Was seen for evaluation with her dermatologist Dr. Karlyn Agee who advised her to stop taking Topamax due to her hair loss and increased dry skin. Patient has been off the Topamax for 2 months and is still having persistent hair loss. States Dr.Jones recommended she be seen with our office to have her hormone levels checked. She is also worried that she may have an elevation in her testosterone level since her husband using Androgel and she comes in contact with it. Appointment for further evaluation scheduled for 08/07/2015 at 10:45 am with Erika Mccoy CNM. She is agreeable to date and time.  Routing to provider for final review. Patient agreeable to disposition. Will close encounter.

## 2015-08-06 NOTE — Telephone Encounter (Signed)
Patient states she is having major hair loss. It is coming out in hands full when washing it. She stopped her medications from her dermatologist and thinks it may be hormone related.

## 2015-08-07 ENCOUNTER — Encounter: Payer: Self-pay | Admitting: Certified Nurse Midwife

## 2015-08-07 ENCOUNTER — Ambulatory Visit (INDEPENDENT_AMBULATORY_CARE_PROVIDER_SITE_OTHER): Payer: BLUE CROSS/BLUE SHIELD | Admitting: Certified Nurse Midwife

## 2015-08-07 VITALS — BP 110/68 | HR 70 | Resp 16 | Ht 64.75 in | Wt 136.0 lb

## 2015-08-07 DIAGNOSIS — L659 Nonscarring hair loss, unspecified: Secondary | ICD-10-CM

## 2015-08-07 LAB — CBC WITH DIFFERENTIAL/PLATELET
Basophils Absolute: 0 10*3/uL (ref 0.0–0.1)
Basophils Relative: 0 % (ref 0–1)
EOS PCT: 1 % (ref 0–5)
Eosinophils Absolute: 0.1 10*3/uL (ref 0.0–0.7)
HEMATOCRIT: 40.3 % (ref 36.0–46.0)
Hemoglobin: 13.6 g/dL (ref 12.0–15.0)
LYMPHS PCT: 32 % (ref 12–46)
Lymphs Abs: 2.2 10*3/uL (ref 0.7–4.0)
MCH: 29.4 pg (ref 26.0–34.0)
MCHC: 33.7 g/dL (ref 30.0–36.0)
MCV: 87 fL (ref 78.0–100.0)
MONO ABS: 0.5 10*3/uL (ref 0.1–1.0)
MPV: 9.9 fL (ref 8.6–12.4)
Monocytes Relative: 7 % (ref 3–12)
Neutro Abs: 4.1 10*3/uL (ref 1.7–7.7)
Neutrophils Relative %: 60 % (ref 43–77)
Platelets: 199 10*3/uL (ref 150–400)
RBC: 4.63 MIL/uL (ref 3.87–5.11)
RDW: 13.4 % (ref 11.5–15.5)
WBC: 6.8 10*3/uL (ref 4.0–10.5)

## 2015-08-07 LAB — ESTRADIOL: Estradiol: 235 pg/mL

## 2015-08-07 LAB — FOLLICLE STIMULATING HORMONE: FSH: 5.5 m[IU]/mL

## 2015-08-07 LAB — TESTOSTERONE: TESTOSTERONE: 34 ng/dL

## 2015-08-07 LAB — TSH: TSH: 1.63 mIU/L

## 2015-08-07 NOTE — Progress Notes (Signed)
Subjective:     Patient ID: Erika Mccoy, female   DOB: Apr 12, 1966, 50 y.o.   MRN: [id number]  HPI Here today complaining of excessive hair loss, for the past 3-4 months. Patient started hair change in 2015 when she met her current husband. Periods have been normal. He uses Androgel and she has some contact with this, but has not noted drastic changes until the last 6 months. She was seen by Dr. Ronnald Ramp dermatology, who felt was not a problem.He did suggest she stop her Topomax, due to risk of hair change with.? She is noting large amounts of hair falling out now, no bald spots. Does use hair coloring and has changed types to see if would help with. Mother has been ill and spouse traveling long periods of time, so patient has been under stressful times. Feels she eats healthy diet. She has lost 32 pounds over the past 2 years. Patient concerned it is a hormonal problem and is very distressed. No family history of early thinning or baldness. No other health issues today.   Review of Systems  Constitutional: Negative.   Skin: Negative.        Obvious hair thinning       Objective:   Physical Exam  Constitutional: She is oriented to person, place, and time. She appears well-developed and well-nourished.  Neck: No thyromegaly present.  Neurological: She is alert and oriented to person, place, and time.  Skin: Skin is warm and dry.  Psychiatric: She has a normal mood and affect. Her behavior is normal. Judgment and thought content normal.  Hair: obvious coloring on hair, thin in appearance, no bald spots noted. No rashes. Patient showed pictures on phone from the last 2 years and hair appearance has changed..     Assessment:    Hair thinning, excessive, uses hair coloring Possible testosterone exposure Social stress with mother's illness and spouse absence with employment     Plan:    Discussed agree she has had hair loss, due to pictures and last visit here with me. Instructed to stop all  hair dye products as can affect hair health. Start OTC supplement Biotin oral used for hair and nails to see if any change. Discussed labs to evaluate for ? Thyroid change, hormonal level, Vitamin D and Testosterone and anemia. Patient agreeable. Labs: TSH,Vitamin D, Estradiol,Testosterone, CBC with diff, FSH Discussed seeing another dermatologist and patient would like to see a woman. Given Dr.Zoe Draelos information. Will try above and discuss once labs in.  Rv prn

## 2015-08-08 LAB — VITAMIN D 25 HYDROXY (VIT D DEFICIENCY, FRACTURES): Vit D, 25-Hydroxy: 48 ng/mL (ref 30–100)

## 2015-08-10 ENCOUNTER — Other Ambulatory Visit: Payer: Self-pay | Admitting: Certified Nurse Midwife

## 2015-08-10 DIAGNOSIS — N644 Mastodynia: Secondary | ICD-10-CM

## 2015-08-11 ENCOUNTER — Telehealth: Payer: Self-pay | Admitting: Certified Nurse Midwife

## 2015-08-11 NOTE — Telephone Encounter (Signed)
Patient is returning a call from our office. Patient said no one left a message but she is expecting results from her most recent visit.

## 2015-08-11 NOTE — Telephone Encounter (Signed)
Spoke with patient. Results given as seen below from Leota Sauers CNM. She is agreeable and verbalizes understanding. She will contact Dr.Draelos's office to schedule an appointment for evaluation.  Notes Recorded by Verner Chol, CNM on 08/11/2015 at 12:59 PM Notify patient that Vitamin D is normal FSH is normal Testosterone is normal Estrogen level is normal TSH is normal CBC with diff is normal Patient needs to see dermatology now for evaluation. Would recommend Dr. Vernell Barrier. She was given this information.  Routing to provider for final review. Patient agreeable to disposition. Will close encounter.

## 2015-08-12 NOTE — Progress Notes (Signed)
Encounter reviewed Jill Jertson, MD   

## 2015-08-14 ENCOUNTER — Other Ambulatory Visit: Payer: Self-pay | Admitting: Certified Nurse Midwife

## 2015-08-14 ENCOUNTER — Telehealth: Payer: Self-pay | Admitting: Obstetrics and Gynecology

## 2015-08-14 ENCOUNTER — Ambulatory Visit
Admission: RE | Admit: 2015-08-14 | Discharge: 2015-08-14 | Disposition: A | Discharge status: Home / Self Care | Payer: BLUE CROSS/BLUE SHIELD | Source: Ambulatory Visit | Attending: Certified Nurse Midwife | Admitting: Certified Nurse Midwife

## 2015-08-14 DIAGNOSIS — N644 Mastodynia: Secondary | ICD-10-CM

## 2015-08-14 NOTE — Telephone Encounter (Signed)
Patient called the number for the dermatologist Dr.Silva recommended and got a recording for a research study. Patient is asking for a number to another dermatologist.

## 2015-08-14 NOTE — Telephone Encounter (Signed)
Call to patient and she is given another number to Dr. Vernell Barrier office in [place]. Advised I am unsure if accepting new patients or only does clinical research, however, she can call to obtain further information and if needs assstance with another Dermatologist who may be accepting new patients to please call back.  Patient agreeable.

## 2015-10-05 ENCOUNTER — Telehealth: Payer: Self-pay | Admitting: Obstetrics & Gynecology

## 2015-10-05 NOTE — Telephone Encounter (Signed)
Patient calling requesting a referral to a rheumatologist. She's like to see someone with [facility].

## 2015-10-06 NOTE — Telephone Encounter (Signed)
Call to patient. She states she is still having hair loss and fatigue symptoms.  She reports worsening symptoms since 2000. She reports aching feelings and increased fatigue with activity. She states that completing house work or simple chores cause her to be fatigued for days and these symptoms are increasing over time. She reports that she is having to intermittently catheterize her bladder due to interstical cystitis and having increased LSA flares. She reports increase in IBS and diarrhea with bouts of her fibromyalgia flares.  She is requests referral from our office beause we were last to see her with her hair loss complaints. She states she is now having to wear a wig and is also losing pubic hair.  She states "I really think something is wrong with me. If so, [facility] would be the one to know." She is encouraged to gather her medical records from her providers.   Advised will send message to Dr. Hyacinth Meeker to review and advise if referral can be placed for patient. She is agreeable.

## 2015-10-06 NOTE — Telephone Encounter (Signed)
Patient stated if she can not get in with a rheumatologist ASAP at [facility],  she would like her hair loss and LSA records with cover letter sent to Dr.Deveshwar.

## 2015-10-07 ENCOUNTER — Other Ambulatory Visit: Payer: Self-pay

## 2015-10-07 MED ORDER — OMEPRAZOLE 40 MG PO CPDR: 40.0000 mg | DELAYED_RELEASE_CAPSULE | Freq: Every day | ORAL | Status: DC

## 2015-10-07 NOTE — Telephone Encounter (Signed)
Pt would like a refill on omeprazole (PRILOSEC) 40 MG capsule sent to [facility] on [address].  She made an appt to come in to see Dr. Lucie Leather on Tuesday to talk about some of her concerns with her hair loss.   Please Advise   Thanks

## 2015-10-07 NOTE — Telephone Encounter (Signed)
Call to patient and she will call back for message she is at an appointment at this time.

## 2015-10-07 NOTE — Telephone Encounter (Signed)
[facility] and Dr. Corliss Skains will review records first and they will not see her for these two issues as they are not clearly rheumatologic in origin.  A letter will not make any difference.    She needs to start with her PCP and have appropriate lab work.  If any of this indicates possible rheumatologic origin, then referral can be made.  I know she's seen a dermatologist in [place].  Dr. Isaac Laud at [facility] is a hair loss specialist.  Will take months for an appt but referral can be made and she can call for cancellations if desired.  Typically, she can get it much sooner if she calls for cancellations.  Ok to place referral to derm, Dr. Darreld Mclean, if this is something pt desires.

## 2015-10-07 NOTE — Telephone Encounter (Signed)
Script sent into pharmacy 

## 2015-10-07 NOTE — Telephone Encounter (Signed)
Patient returned call and line was disconnected.  Return call to patient.  She is not current patient of Dr. Corliss Skains. She asks if she has a record of ANA positive lab work. Advised that I do not see result in EPIC but that her diagnosis list states ANA positive. Attempted to review chart for results and was unable to find result.  She is advised of message from Dr. Hyacinth Meeker as below, advised need to start with PCP and unable to finish giving message before patient stated "that's awesome, that's fine. I just have four autoimmune issues. That's fine." and disconnected line.   I was unable to provide remainder of message to patient before she disconnected the line.   Will route to Dr. Hyacinth Meeker and close encounter.

## 2015-10-08 ENCOUNTER — Encounter: Payer: Self-pay | Admitting: Obstetrics & Gynecology

## 2015-10-13 ENCOUNTER — Ambulatory Visit: Payer: BLUE CROSS/BLUE SHIELD | Admitting: Allergy and Immunology

## 2016-01-05 ENCOUNTER — Ambulatory Visit: Payer: BLUE CROSS/BLUE SHIELD | Admitting: Allergy and Immunology

## 2016-01-07 ENCOUNTER — Ambulatory Visit: Payer: BLUE CROSS/BLUE SHIELD | Admitting: Allergy & Immunology

## 2016-01-11 ENCOUNTER — Ambulatory Visit (INDEPENDENT_AMBULATORY_CARE_PROVIDER_SITE_OTHER): Payer: BLUE CROSS/BLUE SHIELD | Admitting: Allergy & Immunology

## 2016-01-11 ENCOUNTER — Encounter (INDEPENDENT_AMBULATORY_CARE_PROVIDER_SITE_OTHER): Payer: Self-pay

## 2016-01-11 ENCOUNTER — Encounter: Payer: Self-pay | Admitting: Allergy & Immunology

## 2016-01-11 ENCOUNTER — Telehealth: Payer: Self-pay | Admitting: Nurse Practitioner

## 2016-01-11 VITALS — BP 120/70 | HR 75 | Temp 98.0°F | Resp 18

## 2016-01-11 DIAGNOSIS — J4530 Mild persistent asthma, uncomplicated: Secondary | ICD-10-CM | POA: Insufficient documentation

## 2016-01-11 DIAGNOSIS — J310 Chronic rhinitis: Secondary | ICD-10-CM

## 2016-01-11 DIAGNOSIS — Z9114 Patient's other noncompliance with medication regimen: Secondary | ICD-10-CM | POA: Diagnosis not present

## 2016-01-11 DIAGNOSIS — T781XXA Other adverse food reactions, not elsewhere classified, initial encounter: Secondary | ICD-10-CM | POA: Diagnosis not present

## 2016-01-11 DIAGNOSIS — Z91148 Patient's other noncompliance with medication regimen for other reason: Secondary | ICD-10-CM | POA: Insufficient documentation

## 2016-01-11 MED ORDER — PANTOPRAZOLE SODIUM 40 MG PO TBEC: 40.0000 mg | DELAYED_RELEASE_TABLET | Freq: Every day | ORAL | 5 refills | Status: DC

## 2016-01-11 NOTE — Patient Instructions (Addendum)
1. Adverse food reaction (cow's milk, peanut butter) - We will retest for food allergies: nut panel, cow's milk, wheat - We will not prescribe an EpiPen at this time unless the testing is concerning.   2. Mild persistent asthma, uncomplicated - Use Qvar two puffs twice daily when you have respiratory problems.  - Use a spacer every time.  - Do this for two weeks to help you through the respiratory infection. - Use Xopenex four puffs every 4-6 hours as needed.  3. Chronic rhinitis - We will get lab work: environmental allergen panel - Restart the Singulair $RemoveBeforeDEI _[id number]$10mgis might help your throat mucous in the mornings. - Consider getting a sinus CT to see what the sinuses look like at this time. - Let us know if you are interested and we will order it.   4. Reflux/abdominal discomfort - I would recommend getting a different GI doctor to see what their opinion would be.  - We could change to pantoprazole 40mg  daily. We will send in the script.  5. Return to clinic in three months.   It was a pleasure meeting you today!

## 2016-01-11 NOTE — Telephone Encounter (Signed)
Spoke with patient. Patient had an ablation in 1997/1998. Reports since then she has bleeding once per month that lasts 2-3 days and is very light. Reports this month she began to have "terrible cramping" and breast tenderness. Took UPT which was negative. Reports she started having bleeding that is light, but is bright red. "My bleeding is usually dark." States that today is day 7 of light bleeding and is having vaginal discomfort. Denies any sharp pain. Advised she will need to be seen in the office for further evaluation. Offered appointment for tomorrow, but patient declines. Appointment scheduled for 01/15/2016 at 12:45 pm with Ria Comment, FNP. She is agreeable to date and time.  Routing to provider for final review. Patient agreeable to disposition. Will close encounter.
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
## 2016-01-11 NOTE — Telephone Encounter (Signed)
Patient is asking to talk with a nurse regarding a "really weird period that she had.recently."

## 2016-01-15 ENCOUNTER — Encounter: Payer: Self-pay | Admitting: Nurse Practitioner

## 2016-01-15 ENCOUNTER — Ambulatory Visit (INDEPENDENT_AMBULATORY_CARE_PROVIDER_SITE_OTHER): Payer: BLUE CROSS/BLUE SHIELD | Admitting: Nurse Practitioner

## 2016-01-15 VITALS — BP 126/84 | HR 96 | Ht 64.75 in | Wt 139.0 lb

## 2016-01-15 DIAGNOSIS — N760 Acute vaginitis: Secondary | ICD-10-CM | POA: Diagnosis not present

## 2016-01-15 DIAGNOSIS — N904 Leukoplakia of vulva: Secondary | ICD-10-CM

## 2016-01-15 DIAGNOSIS — F3160 Bipolar disorder, current episode mixed, unspecified: Secondary | ICD-10-CM | POA: Diagnosis not present

## 2016-01-15 NOTE — Progress Notes (Signed)
Reviewed personally.  M. Suzanne Talissa Apple, MD.  

## 2016-01-15 NOTE — Patient Instructions (Signed)
We will call with results of  Affirm Use Temovate BID for a week to  treat LSA
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
## 2016-01-16 LAB — WET PREP BY MOLECULAR PROBE
Candida species: POSITIVE — AB
GARDNERELLA VAGINALIS: NEGATIVE
Trichomonas vaginosis: NEGATIVE

## 2016-01-17 ENCOUNTER — Other Ambulatory Visit: Payer: Self-pay | Admitting: Nurse Practitioner

## 2016-01-17 NOTE — Progress Notes (Signed)
Medication was not sent in until we can determine if she has had an allergic reaction to this.  The allergy contraindication comes up when placed the order.  Please verify her status.

## 2016-01-19 ENCOUNTER — Other Ambulatory Visit: Payer: Self-pay | Admitting: Nurse Practitioner

## 2016-01-19 MED ORDER — FLUCONAZOLE 150 MG PO TABS: 150.0000 mg | ORAL_TABLET | Freq: Once | ORAL | 0 refills | Status: AC

## 2016-01-21 NOTE — Progress Notes (Signed)
See result note dated 01/17/16 for resolution of call.

## 2016-02-12 ENCOUNTER — Emergency Department (HOSPITAL_COMMUNITY)
Admission: EM | Admit: 2016-02-12 | Discharge: 2016-02-12 | Disposition: A | Discharge status: Home / Self Care | Payer: BLUE CROSS/BLUE SHIELD | Attending: Physician Assistant | Admitting: Physician Assistant

## 2016-02-12 ENCOUNTER — Encounter (HOSPITAL_COMMUNITY): Payer: Self-pay | Admitting: *Deleted

## 2016-02-12 DIAGNOSIS — Y9389 Activity, other specified: Secondary | ICD-10-CM | POA: Insufficient documentation

## 2016-02-12 DIAGNOSIS — Z79899 Other long term (current) drug therapy: Secondary | ICD-10-CM | POA: Diagnosis not present

## 2016-02-12 DIAGNOSIS — Y999 Unspecified external cause status: Secondary | ICD-10-CM | POA: Diagnosis not present

## 2016-02-12 DIAGNOSIS — J45909 Unspecified asthma, uncomplicated: Secondary | ICD-10-CM | POA: Insufficient documentation

## 2016-02-12 DIAGNOSIS — IMO0002 Reserved for concepts with insufficient information to code with codable children: Secondary | ICD-10-CM

## 2016-02-12 DIAGNOSIS — Y929 Unspecified place or not applicable: Secondary | ICD-10-CM | POA: Diagnosis not present

## 2016-02-12 DIAGNOSIS — W260XXA Contact with knife, initial encounter: Secondary | ICD-10-CM | POA: Insufficient documentation

## 2016-02-12 DIAGNOSIS — S91012A Laceration without foreign body, left ankle, initial encounter: Secondary | ICD-10-CM | POA: Insufficient documentation

## 2016-02-12 MED ORDER — LIDOCAINE HCL 2 % IJ SOLN
10.0000 mL | Freq: Once | INTRAMUSCULAR | Status: AC
  Administered 2016-02-12: 200 mg

## 2016-02-12 NOTE — ED Provider Notes (Signed)
[facility]-EMERGENCY DEPT Provider Note   [name]: [id number] Arrival date & time: 02/12/16  1753  By signing my name below, I, Emmanuella Mensah, attest that this documentation has been prepared under the direction and in the presence of [facility], PA-C. Electronically Signed: Angelene Giovanni, ED Scribe. 02/12/16. 7:38 PM.  History   Chief Complaint Chief Complaint  Patient presents with  . Laceration    l ankle    HPI Comments: Erika Mccoy is a 50 y.o. female with a hx of manic depression, bipolar 1 disorder, and dissociation disorder who presents to the Emergency Department complaining of a laceration to the medial aspect of her left ankle s/p injury that occurred one hour PTA. 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 She states that she has been working hard with her therapist and did not cut herself for 3 weeks (the longest she has ever gone) however today she cut herself with a pocket knife. Bleeding is currently being controlled by a gauze with applied pressure. She has not taken any medications PTA. Pt reports that her last tetanus vaccine was 08/23/2014. She denies any SI/HI, auditory/visual hallucinations.   The history is provided by the patient. No language interpreter was used.    Past Medical History:  Diagnosis Date  . Abnormal Pap smear of cervix 1997   --hx of conization of cervix by Dr. Roberto Scales  . Anxiety   . Arm sprain 5/11   right   . Broken arm    left arm by elbow  . Fibromyalgia   . Genital warts   . History of self mutilation   . HSV-2 infection    rare occurence  . 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 IBS (irritable bowel syndrome)   . Interstitial cystitis   . Lichen sclerosus    Vulva  . Manic depression (HCC)   . MS (multiple sclerosis) (HCC)     Patient Active Problem List   Diagnosis Date Noted  . Adverse food reaction 01/11/2016  . Mild persistent asthma 01/11/2016  .  Chronic rhinitis 01/11/2016  . Non compliance w medication regimen 01/11/2016  . Bipolar disorder, manic (HCC) 05/05/2015  . Fothergill's neuralgia 01/15/2014  . Rotator cuff syndrome 07/15/2013  . ANA positive 03/12/2013  . PCB (post coital bleeding) 01/18/2013  . Lichen sclerosus 12/03/2012  . Irritable bowel syndrome 12/03/2012  . Interstitial cystitis 12/03/2012  . Hypertriglyceridemia 10/25/2011  . Allergic rhinitis, seasonal 09/28/2011  . Adhesions of vagina 08/22/2011  . 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 Acne 12/21/2010  . Bipolar 1 disorder (HCC) 12/09/2010  . 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 Fibrositis 12/09/2010  . Anxiety, generalized 12/09/2010  . 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 BREAST ENHANCEMENT SURGERY  1990   Saline Implants  . CERVIX LESION DESTRUCTION  1997   Dr. Roberto Scales  . ENDOMETRIAL ABLATION  1997/1998  . NASAL SINUS SURGERY    . TONSILLECTOMY      OB History    Gravida Para Term Preterm AB Living   2 2       3    SAB TAB Ectopic Multiple Live Births         1 3       Home Medications    Prior to Admission medications   Medication Sig Start Date End Date Taking? Authorizing Provider  BIOTIN 5000  PO Take 1 tablet by mouth.    Historical Provider, MD  buPROPion (WELLBUTRIN XL) 300 MG 24 hr tablet  01/10/16   Historical Provider, MD  Cholecalciferol (D3-1000) 1000 units capsule Take 5,000 Units by mouth daily.    Historical Provider, MD  clonazePAM (KLONOPIN) 1 MG tablet Take 1 tablet (1 mg total) by mouth 2 (two) times daily. 05/10/15   Beau Fanny, FNP  dexlansoprazole (DEXILANT) 60 MG capsule Take 60 mg by mouth daily.    Historical Provider, MD  Fe Bisgly-Succ-C-Thre-B12-FA (IRON-150 PO) Take 1 tablet by mouth.    Historical Provider, MD  hydrOXYzine (ATARAX/VISTARIL) 25 MG tablet Take 2 tablets by mouth at bedtime. 06/13/15   Historical Provider, MD  LamoTRIgine (LAMICTAL XR) 300 MG  TB24 Take 1 tablet by mouth daily.    Historical Provider, MD  linaclotide (LINZESS) 145 MCG CAPS capsule Take 145 mcg by mouth daily before breakfast.    Historical Provider, MD  polyethylene glycol (MIRALAX / GLYCOLAX) packet Take 17 g by mouth daily as needed for mild constipation. 05/10/15   Beau Fanny, FNP  Prenatal Vit-Fe Fumarate-FA (PRENATAL MULTIVITAMIN) TABS tablet Take 1 tablet by mouth daily at 12 noon.    Historical Provider, MD  traMADol (ULTRAM) 50 MG tablet Take 50 mg by mouth 3 (three) times daily as needed. 06/18/15   Historical Provider, MD  traZODone (DESYREL) 100 MG tablet Take 2 tablets (200 mg total) by mouth at bedtime. 05/10/15   Beau Fanny, FNP    Family History Family History  Problem Relation Age of Onset  . Hypertension Mother   . Hyperlipidemia Mother   . Lung cancer Mother   . Thyroid cancer Mother   . COPD Mother   . Lung cancer Father 78  . Heart attack Father 50    3 vessel CABG  . Leukemia Maternal Grandmother   . Stroke Maternal Grandfather   . Stroke Paternal Grandmother   . Heart failure Paternal Grandfather   . Thyroid disease Maternal Aunt   . Osteoarthritis Maternal Aunt     Social History Social History  Substance Use Topics  . Smoking status: Never Smoker  . Smokeless tobacco: Never Used  . Alcohol use 0.0 oz/week     Comment: 1 glass of red wine nightly     Allergies   Neomycin-bacitracin-polymyxin  [bacitracin-neomycin-polymyxin]; Quinolones; Benzalkonium chloride; Cefzil [cefprozil]; Cephalexin; Ciprofloxacin; Lidocaine; Monistat [miconazole]; Neosporin [neomycin-bacitracin zn-polymyx]; Quinine; Septra [sulfamethoxazole-trimethoprim]; Valtrex [valacyclovir hcl]; and Adhesive [tape]   Review of Systems Review of Systems  Constitutional: Negative for fever.  Skin: Positive for wound.  Psychiatric/Behavioral: Negative for suicidal ideas.     Physical Exam Updated Vital Signs BP 106/72 (BP Location: Right Arm)   Pulse  74   Temp 98.5 F (36.9 C) (Oral)   Resp 18   Ht 5\' 5"  (1.651 m)   Wt 59 kg   LMP 02/07/2016 Comment: BRB x 7 days  SpO2 97%   BMI 21.63 kg/m   Physical Exam  Constitutional: She is oriented to person, place, and time. She appears well-developed and well-nourished. No distress.  HENT:  Head: Normocephalic and atraumatic.  Neck: Neck supple. No tracheal deviation present.  Cardiovascular: Normal rate, regular rhythm, normal heart sounds and intact distal pulses.   Pulmonary/Chest: Effort normal and breath sounds normal. No respiratory distress.  Musculoskeletal: Normal range of motion.  Neurological: She is alert and oriented to person, place, and time.  Left lower extremity neurovascularly intact.  Skin: Skin  is warm and dry.  2.5 cm laceration to medial left lower leg.  Nursing note and vitals reviewed.    ED Treatments / Results  DIAGNOSTIC STUDIES: Oxygen Saturation is 99% on RA, normal by my interpretation.    COORDINATION OF CARE: 7:15 PM- Pt advised of plan for treatment and pt agrees. Pt will receive laceration repair.   Procedures .Marland Kitchen.Laceration Repair Date/Time: 02/12/2016 7:35 PM Performed by: Janyth ContesWARD, Kveon Casanas PILCHER Authorized by: Janyth ContesWARD, Marquette Piontek PILCHER   Consent:    Consent obtained:  Verbal   Consent given by:  Patient   Risks discussed:  Pain Anesthesia (see MAR for exact dosages):    Anesthesia method:  Local infiltration   Local anesthetic:  Lidocaine 2% w/o epi Laceration details:    Location:  Foot   Foot location:  L ankle   Length (cm):  2.5 Repair type:    Repair type:  Simple Treatment:    Area cleansed with:  Saline   Amount of cleaning:  Standard   Irrigation solution:  Sterile saline Skin repair:    Repair method:  Sutures   Suture size:  5-0   Suture material:  Nylon   Suture technique:  Simple interrupted Approximation:    Approximation:  Close Post-procedure details:    Dressing:  Sterile dressing   Patient tolerance of procedure:   Tolerated well, no immediate complications    (including critical care time)  Medications Ordered in ED Medications  lidocaine (XYLOCAINE) 2 % (with pres) injection 200 mg (200 mg Other Given 02/12/16 1927)     Initial Impression / Assessment and Plan / ED Course  Elizabeth SauerJaime Whittney Steenson, PA-C has reviewed the triage vital signs and the nursing notes.  Pertinent labs & imaging results that were available during my care of the patient were reviewed by me and considered in my medical decision making (see chart for details).  Clinical Course   Laurance FlattenStephanie S Comer is a 50 y.o. female who presents to ED for laceration of LLE. Wound explored and bottom of wound seen in a bloodless field. Laceration repaired as dictated above. Tetanus up-to-date. Patient counseled on home wound care. Follow up with PCP/urgent care or return to ER for suture removal in 7 days. Patient was urged to return to the Emergency Department for worsening pain, swelling, expanding erythema especially if it streaks away from the affected area, fever, or for any additional concerns. Patient verbalized understanding. All questions answered.    Final Clinical Impressions(s) / ED Diagnoses   Final diagnoses:  Laceration    New Prescriptions Discharge Medication List as of 02/12/2016  8:04 PM     I personally performed the services described in this documentation, which was scribed in my presence. The recorded information has been reviewed and is accurate.     [facility] NavarroJaime Pilcher Janis Cuffe, PA-C 02/12/16 2113    Courteney Randall AnLyn Mackuen, MD 02/13/16 2109

## 2016-02-12 NOTE — Discharge Instructions (Signed)
Keep wound clean with mild soap and water. Keep area covered with a topical antibiotic ointment and bandage, keep bandage dry, and do not submerge in water for 24 hours. Ice and elevate for additional pain relief and swelling. Alternate between ibuprofen and Tylenol for additional pain relief. Follow up with your primary care doctor or the [facility] in approximately 7 days for wound recheck and suture removal. Monitor area for signs of infection to include, but not limited to: increasing pain, spreading redness, drainage/pus, worsening swelling, or fevers. Return to emergency department for emergent changing or worsening symptoms. ° ° °WOUND CARE °· Keep area clean and dry for 24 hours. Do not remove bandage, if applied. °· After 24 hours,you should change it at least once a day. Also, change the dressing if it becomes wet or dirty, or as directed by your caregiver.  °· Wash the wound with soap and water 2 times a day. Rinse the wound off with water to remove all soap. Pat the wound dry with a clean towel.  °· You may shower as usual after the first 24 hours. Do not soak the wound in water until the sutures are removed.  °· Once the wound has healed, scarring can be minimized by covering the wound with sunscreen during the day for 1 full year. °· Do not apply any ointments or creams to the wound while stitches/staples are in place, as this may cause delayed healing. °· Return if you experience any of the following signs of infection: Swelling, redness, pus drainage, streaking, fever >101.0 F °· Return if you experience excessive bleeding that does not stop after 15-20 minutes of constant, firm pressure. °

## 2016-02-12 NOTE — ED Triage Notes (Signed)
Pt admits she is a cutter, has not cut self in a while. Today cut left lower leg 1.5 inches, bleeding controlled.

## 2016-02-26 ENCOUNTER — Encounter (HOSPITAL_COMMUNITY): Payer: Self-pay

## 2016-02-26 ENCOUNTER — Emergency Department (HOSPITAL_COMMUNITY)
Admission: EM | Admit: 2016-02-26 | Discharge: 2016-02-27 | Disposition: A | Discharge status: Home / Self Care | Payer: BLUE CROSS/BLUE SHIELD | Attending: Emergency Medicine | Admitting: Emergency Medicine

## 2016-02-26 ENCOUNTER — Emergency Department (HOSPITAL_COMMUNITY): Payer: BLUE CROSS/BLUE SHIELD

## 2016-02-26 DIAGNOSIS — S0990XA Unspecified injury of head, initial encounter: Secondary | ICD-10-CM | POA: Diagnosis present

## 2016-02-26 DIAGNOSIS — S0083XA Contusion of other part of head, initial encounter: Secondary | ICD-10-CM

## 2016-02-26 DIAGNOSIS — Y92009 Unspecified place in unspecified non-institutional (private) residence as the place of occurrence of the external cause: Secondary | ICD-10-CM | POA: Diagnosis not present

## 2016-02-26 DIAGNOSIS — S0181XA Laceration without foreign body of other part of head, initial encounter: Secondary | ICD-10-CM | POA: Insufficient documentation

## 2016-02-26 DIAGNOSIS — W108XXA Fall (on) (from) other stairs and steps, initial encounter: Secondary | ICD-10-CM | POA: Diagnosis not present

## 2016-02-26 DIAGNOSIS — S060X0A Concussion without loss of consciousness, initial encounter: Secondary | ICD-10-CM | POA: Insufficient documentation

## 2016-02-26 DIAGNOSIS — Z79899 Other long term (current) drug therapy: Secondary | ICD-10-CM | POA: Insufficient documentation

## 2016-02-26 DIAGNOSIS — Y939 Activity, unspecified: Secondary | ICD-10-CM | POA: Diagnosis not present

## 2016-02-26 DIAGNOSIS — Y999 Unspecified external cause status: Secondary | ICD-10-CM | POA: Diagnosis not present

## 2016-02-26 MED ORDER — FENTANYL CITRATE (PF) 100 MCG/2ML IJ SOLN
50.0000 ug | Freq: Once | INTRAMUSCULAR | Status: AC
Start: 2016-02-26 — End: 2016-02-27
  Administered 2016-02-27: 50 ug via INTRAVENOUS
  Filled 2016-02-26: qty 2
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
## 2016-02-26 NOTE — ED Provider Notes (Signed)
[facility]-EMERGENCY DEPT Provider Note   [name]: [id number] Arrival date & time: 02/26/16  2208 By signing my name below, I, Levon Hedger, attest that this documentation has been prepared under the direction and in the presence of No att. providers found . Electronically Signed: Levon Hedger, Scribe. 02/27/2016. 12:35 AM.   History   Chief Complaint Chief Complaint  Patient presents with  . Alcohol Intoxication  . Fall    HPI Erika Mccoy is a 50 y.o. female with hx of fibromyalgia, MS, anxiety, and Bipolar 1 disorder who presents to the Emergency Department complaining of sudden onset, severe facial pain s/p fall PTA. Per pt's husband, she had been drinking heavily tonight when she fell down brick steps at home, landing on her face. He states she did not brace her fall. She has no memory of the fall. She notes associated abrasions to face. No medications taken PTA. Pt denies any hx of DM, MI, or CVA. She has no other complaints at this time.   The history is provided by the patient. No language interpreter was used.     Past Medical History:  Diagnosis Date  . Abnormal Pap smear of cervix 1997   --hx of conization of cervix by Dr. Roberto Scales  . Anxiety   . Arm sprain 5/11   right   . Broken arm    left arm by elbow  . Fibromyalgia   . Genital warts   . History of self mutilation   . HSV-2 infection    rare occurence  . 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 IBS (irritable bowel syndrome)   . Interstitial cystitis   . Lichen sclerosus    Vulva  . Manic depression (HCC)   . MS (multiple sclerosis) (HCC)     Patient Active Problem List   Diagnosis Date Noted  . Adverse food reaction 01/11/2016  . Mild persistent asthma 01/11/2016  . Chronic rhinitis 01/11/2016  . Non compliance w medication regimen 01/11/2016  . Bipolar disorder, manic (HCC) 05/05/2015  . Fothergill's neuralgia 01/15/2014  . Rotator cuff syndrome 07/15/2013  . ANA positive 03/12/2013  . PCB (post  coital bleeding) 01/18/2013  . Lichen sclerosus 12/03/2012  . Irritable bowel syndrome 12/03/2012  . Interstitial cystitis 12/03/2012  . Hypertriglyceridemia 10/25/2011  . Allergic rhinitis, seasonal 09/28/2011  . Adhesions of vagina 08/22/2011  . 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 Acne 12/21/2010  . Bipolar 1 disorder (HCC) 12/09/2010  . 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 Fibrositis 12/09/2010  . Anxiety, generalized 12/09/2010  . 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 BREAST ENHANCEMENT SURGERY  1990   Saline Implants  . CERVIX LESION DESTRUCTION  1997   Dr. Roberto Scales  . ENDOMETRIAL ABLATION  1997/1998  . NASAL SINUS SURGERY    . TONSILLECTOMY      OB History    Gravida Para Term Preterm AB Living   2 2       3    SAB TAB Ectopic Multiple Live Births         1 3      Home Medications    Prior to Admission medications   Medication Sig Start Date End Date Taking? Authorizing Provider  BIOTIN 5000 PO Take 1 tablet by mouth.   Yes Historical Provider, MD  buPROPion (WELLBUTRIN XL) 300 MG 24 hr tablet Take 300 mg by mouth every morning.  01/10/16  Yes Historical Provider, MD  cephALEXin (  KEFLEX) 500 MG capsule Take 500 mg by mouth 3 (three) times daily. 02/23/16  Yes Historical Provider, MD  Cholecalciferol (D3-1000) 1000 units capsule Take 5,000 Units by mouth daily.   Yes Historical Provider, MD  clonazePAM (KLONOPIN) 1 MG tablet Take 1 tablet (1 mg total) by mouth 2 (two) times daily. Patient taking differently: Take 1 mg by mouth 2 (two) times daily as needed for anxiety.  05/10/15  Yes Beau Fanny, FNP  dexlansoprazole (DEXILANT) 60 MG capsule Take 60 mg by mouth daily.   Yes Historical Provider, MD  Fe Bisgly-Succ-C-Thre-B12-FA (IRON-150 PO) Take 1 tablet by mouth.   Yes Historical Provider, MD  hydrOXYzine (ATARAX/VISTARIL) 25 MG tablet Take 50 tablets by mouth at bedtime.  06/13/15  Yes Historical Provider, MD    lamoTRIgine (LAMICTAL) 150 MG tablet Take 300 mg by mouth every morning. 02/17/16  Yes Historical Provider, MD  LINZESS 290 MCG CAPS capsule Take 290 mcg by mouth every morning. 01/29/16  Yes Historical Provider, MD  polyethylene glycol (MIRALAX / GLYCOLAX) packet Take 17 g by mouth daily as needed for mild constipation. 05/10/15  Yes Beau Fanny, FNP  Prenatal Vit-Fe Fumarate-FA (PRENATAL MULTIVITAMIN) TABS tablet Take 1 tablet by mouth daily at 12 noon.   Yes Historical Provider, MD  Probiotic Product (PROBIOTIC ADVANCED PO) Take 1 tablet by mouth every morning.   Yes Historical Provider, MD  traZODone (DESYREL) 100 MG tablet Take 2 tablets (200 mg total) by mouth at bedtime. 05/10/15  Yes Beau Fanny, FNP  ibuprofen (ADVIL,MOTRIN) 400 MG tablet Take 1 tablet (400 mg total) by mouth every 6 (six) hours as needed. 02/27/16   Derwood Kaplan, MD  ondansetron (ZOFRAN ODT) 4 MG disintegrating tablet Take 1 tablet (4 mg total) by mouth every 8 (eight) hours as needed for nausea or vomiting. 02/27/16   Derwood Kaplan, MD  traMADol (ULTRAM) 50 MG tablet Take 2 tablets (100 mg total) by mouth every 6 (six) hours as needed. 02/27/16   Derwood Kaplan, MD    Family History Family History  Problem Relation Age of Onset  . Hypertension Mother   . Hyperlipidemia Mother   . Lung cancer Mother   . Thyroid cancer Mother   . COPD Mother   . Lung cancer Father 29  . Heart attack Father 50    3 vessel CABG  . Leukemia Maternal Grandmother   . Stroke Maternal Grandfather   . Stroke Paternal Grandmother   . Heart failure Paternal Grandfather   . Thyroid disease Maternal Aunt   . Osteoarthritis Maternal Aunt     Social History Social History  Substance Use Topics  . Smoking status: Never Smoker  . Smokeless tobacco: Never Used  . Alcohol use 0.0 oz/week     Comment: 1 glass of red wine nightly    Allergies   Neomycin-bacitracin-polymyxin  [bacitracin-neomycin-polymyxin]; Quinolones;  Benzalkonium chloride; Cefzil [cefprozil]; Cephalexin; Ciprofloxacin; Levofloxacin; Lidocaine; Monistat [miconazole]; Neosporin [neomycin-bacitracin zn-polymyx]; Quinine; Septra [sulfamethoxazole-trimethoprim]; Valtrex [valacyclovir hcl]; and Adhesive [tape]   Review of Systems Review of Systems 10 systems reviewed and all are negative for acute change except as noted in the HPI.  Physical Exam Updated Vital Signs BP 121/82   Pulse 94   Temp 98.3 F (36.8 C) (Oral)   Resp 16   LMP 02/07/2016 Comment: BRB x 7 days  SpO2 97%   Physical Exam  Constitutional: She is oriented to person, place, and time. She appears well-developed and well-nourished.  HENT:  Head: Normocephalic.  Stellate type wound to the forehead measuring about 1-2 cm.  Pt also has a irregular laceration to the nose, 2-3 cm in length  Eyes: EOM are normal.  Neck: Normal range of motion.  Pulmonary/Chest: Effort normal.  Abdominal: She exhibits no distension.  Musculoskeletal: Normal range of motion.  Lower extremities grossly non-tender; no deformity seen; no tenderness to palpation. Upper extremity grossly non-tender and no deformities. Pelvis is stable  Neurological: She is alert and oriented to person, place, and time.  Skin: Rash noted.  Psychiatric: She has a normal mood and affect.  Nursing note and vitals reviewed.       ED Treatments / Results  DIAGNOSTIC STUDIES:  Oxygen Saturation is 96% on RA, normal by my interpretation.    COORDINATION OF CARE:  5:13 PM Discussed treatment plan with pt at bedside and pt agreed to plan.  Labs (all labs ordered are listed, but only abnormal results are displayed) Labs Reviewed - No data to display  EKG  EKG Interpretation None       Radiology Ct Head Wo Contrast  Result Date: 02/27/2016 CLINICAL DATA:  Larey Seat down steps after drinking large amount of alcohol EXAM: CT HEAD WITHOUT CONTRAST CT CERVICAL SPINE WITHOUT CONTRAST TECHNIQUE: Multidetector  CT imaging of the head and cervical spine was performed following the standard protocol without intravenous contrast. Multiplanar CT image reconstructions of the cervical spine were also generated. COMPARISON:  CT brain 722 1000 seconds FINDINGS: CT HEAD FINDINGS Brain: No evidence of acute infarction, hemorrhage, hydrocephalus, extra-axial collection or mass lesion/mass effect. Vascular: Mild calcification within the carotid arteries at the skullbase. No hyperdense vessels. Skull: Mastoid air cells clear.  No skull fracture identified. Sinuses/Orbits: Mild mucosal thickening in the ethmoid sinuses. Orbits appear intact. Other: None CT CERVICAL SPINE FINDINGS Alignment: There is straightening of the cervical spine. No subluxation is evident. Skull base and vertebrae: Craniovertebral junction is intact. Vertebral bodies demonstrate normal stature. No fracture is visualized. Soft tissues and spinal canal: Prevertebral soft tissue thickness appears normal. No bony canal compromise. Disc levels:  Minimal degenerative changes at C4-C5 and C5-C6. Upper chest: Lung apices clear.  Thyroid gland is normal. Other: None IMPRESSION: 1. No CT evidence for acute intracranial abnormality. 2. Straightening of the cervical spine. No acute fracture or malalignment. Electronically Signed   By: Jasmine Pang M.D.   On: 02/27/2016 00:10   Ct Cervical Spine Wo Contrast  Result Date: 02/27/2016 CLINICAL DATA:  Larey Seat down steps after drinking large amount of alcohol EXAM: CT HEAD WITHOUT CONTRAST CT CERVICAL SPINE WITHOUT CONTRAST TECHNIQUE: Multidetector CT imaging of the head and cervical spine was performed following the standard protocol without intravenous contrast. Multiplanar CT image reconstructions of the cervical spine were also generated. COMPARISON:  CT brain 722 1000 seconds FINDINGS: CT HEAD FINDINGS Brain: No evidence of acute infarction, hemorrhage, hydrocephalus, extra-axial collection or mass lesion/mass effect.  Vascular: Mild calcification within the carotid arteries at the skullbase. No hyperdense vessels. Skull: Mastoid air cells clear.  No skull fracture identified. Sinuses/Orbits: Mild mucosal thickening in the ethmoid sinuses. Orbits appear intact. Other: None CT CERVICAL SPINE FINDINGS Alignment: There is straightening of the cervical spine. No subluxation is evident. Skull base and vertebrae: Craniovertebral junction is intact. Vertebral bodies demonstrate normal stature. No fracture is visualized. Soft tissues and spinal canal: Prevertebral soft tissue thickness appears normal. No bony canal compromise. Disc levels:  Minimal degenerative changes at C4-C5 and C5-C6. Upper chest: Lung apices clear.  Thyroid gland is  normal. Other: None IMPRESSION: 1. No CT evidence for acute intracranial abnormality. 2. Straightening of the cervical spine. No acute fracture or malalignment. Electronically Signed   By: Jasmine PangKim  Fujinaga M.D.   On: 02/27/2016 00:10    Procedures .Marland Kitchen.Laceration Repair Date/Time: 02/27/2016 2:00 AM Performed by: Derwood KaplanNANAVATI, Isabellamarie Randa Authorized by: Derwood KaplanNANAVATI, Solymar Grace   Consent:    Consent obtained:  Verbal   Consent given by:  Patient and spouse   Risks discussed:  Poor cosmetic result, poor wound healing, infection and pain   Alternatives discussed:  No treatment Anesthesia (see MAR for exact dosages):    Anesthesia method:  Topical application and local infiltration   Local anesthetic:  Lidocaine 1% WITH epi Laceration details:    Location:  Face   Face location:  Forehead   Length (cm):  1.5 Repair type:    Repair type:  Complex Pre-procedure details:    Preparation:  Patient was prepped and draped in usual sterile fashion Exploration:    Limited defect created (wound extended): no     Contaminated: no   Treatment:    Area cleansed with:  Saline   Amount of cleaning:  Standard   Irrigation solution:  Sterile water   Irrigation volume:  50   Irrigation method:  Pressure wash    Visualized foreign bodies/material removed: no     Debridement:  None Skin repair:    Repair method:  Sutures   Suture size:  7-0   Suture material:  Nylon   Suture technique:  Simple interrupted   Number of sutures:  4 Approximation:    Approximation:  Close   Vermilion border: well-aligned   Comments:     Complex wound - stellate like, typical of blunt trauma.    Marland Kitchen..Laceration Repair Date/Time: 02/27/2016 2:00 AM Performed by: Derwood KaplanNANAVATI, Jaevon Paras Authorized by: Derwood KaplanNANAVATI, Tyrrell Stephens   Consent:    Consent obtained:  Verbal   Consent given by:  Patient and spouse   Risks discussed:  Poor cosmetic result, poor wound healing, pain, infection and need for additional repair Anesthesia (see MAR for exact dosages):    Anesthesia method:  Topical application and local infiltration   Topical anesthetic:  LET   Local anesthetic:  Lidocaine 1% WITH epi Laceration details:    Location:  Face   Face location:  Nose   Length (cm):  2 Repair type:    Repair type:  Complex Pre-procedure details:    Preparation:  Patient was prepped and draped in usual sterile fashion Exploration:    Contaminated: no   Treatment:    Area cleansed with:  Saline   Amount of cleaning:  Standard   Irrigation solution:  Sterile water   Irrigation method:  Syringe   Visualized foreign bodies/material removed: no   Skin repair:    Repair method:  Sutures   Suture size:  7-0   Suture material:  Nylon   Suture technique:  Simple interrupted   Number of sutures:  5 Approximation:    Approximation:  Close   Vermilion border: well-aligned   Post-procedure details:    Dressing:  Non-adherent dressing   Patient tolerance of procedure:  Tolerated well, no immediate complications Comments:     Patient had a complex small laceration due to blunt trauma with irregular borders and required minimal debridment of dead skin.    (including critical care time)  Medications Ordered in ED Medications  fentaNYL  (SUBLIMAZE) injection 50 mcg (50 mcg Intravenous Given 02/27/16 0014)  lidocaine-EPINEPHrine (XYLOCAINE W/EPI)  2 %-1:200000 (PF) injection 10 mL (10 mLs Infiltration Given 02/27/16 0125)  lidocaine-EPINEPHrine-tetracaine (LET) solution (3 mLs Topical Given 02/27/16 0137)  fentaNYL (SUBLIMAZE) injection 50 mcg (50 mcg Intravenous Given 02/27/16 0136)  HYDROcodone-acetaminophen (NORCO/VICODIN) 5-325 MG per tablet 1 tablet (1 tablet Oral Given 02/27/16 0506)  ondansetron (ZOFRAN-ODT) disintegrating tablet 4 mg (4 mg Oral Given 02/27/16 0426)     Initial Impression / Assessment and Plan / ED Course  I have reviewed the triage vital signs and the nursing notes.  Pertinent labs & imaging results that were available during my care of the patient were reviewed by me and considered in my medical decision making (see chart for details).  Clinical Course   Pt comes in post fall. Fall was result of alcohol intoxication. Pt unfortunately had direct blunt trauma to her face. She has pretty significant ecchymoses on her face, and is using ice. Pt also has 2 complex lacerations to the face. Her nose is tender, but there is no gross deformity, and more important there is no airway compromise. Pt also has no double vision and her EOMI. CT face is not indicated therefore, as there will be no clinically significant fracture, even if a fracture is present, and pt made aware of this rational.  Lac repaired to the best of our ability. Wound care was discussed extensively. Advised to keep the wound covered at all tomes and to ensure it doesn't get exposed to sun whilst it is healing.   Final Clinical Impressions(s) / ED Diagnoses   Final diagnoses:  Injury of head, initial encounter  Contusion of face, initial encounter  Concussion without loss of consciousness, initial encounter  Facial laceration, initial encounter    New Prescriptions Discharge Medication List as of 02/27/2016  3:27 AM       Derwood Kaplan, MD 02/28/16 1717

## 2016-02-26 NOTE — ED Notes (Signed)
Bed: [facility]16 Expected date:  Expected time:  Means of arrival:  Comments: EMS/intoxicated/fall 

## 2016-02-27 MED ORDER — LIDOCAINE-EPINEPHRINE-TETRACAINE (LET) SOLUTION
3.0000 mL | Freq: Once | NASAL | Status: AC
  Administered 2016-02-27: 3 mL via TOPICAL
  Filled 2016-02-27: qty 3

## 2016-02-27 MED ORDER — IBUPROFEN 200 MG PO TABS
600.0000 mg | ORAL_TABLET | Freq: Once | ORAL | Status: DC
  Filled 2016-02-27: qty 3

## 2016-02-27 MED ORDER — ONDANSETRON 4 MG PO TBDP
4.0000 mg | ORAL_TABLET | Freq: Once | ORAL | Status: AC
  Administered 2016-02-27: 4 mg via ORAL
  Filled 2016-02-27: qty 1

## 2016-02-27 MED ORDER — HYDROCODONE-ACETAMINOPHEN 5-325 MG PO TABS
1.0000 | ORAL_TABLET | Freq: Once | ORAL | Status: AC
  Administered 2016-02-27: 1 via ORAL
  Filled 2016-02-27: qty 1

## 2016-02-27 MED ORDER — FENTANYL CITRATE (PF) 100 MCG/2ML IJ SOLN
50.0000 ug | Freq: Once | INTRAMUSCULAR | Status: AC
  Administered 2016-02-27: 50 ug via INTRAVENOUS
  Filled 2016-02-27: qty 2

## 2016-02-27 MED ORDER — ONDANSETRON 4 MG PO TBDP
4.0000 mg | ORAL_TABLET | Freq: Three times a day (TID) | ORAL | 0 refills | Status: DC | PRN
Start: 2016-02-27 — End: 2016-03-21

## 2016-02-27 MED ORDER — LIDOCAINE-EPINEPHRINE (PF) 2 %-1:200000 IJ SOLN
10.0000 mL | Freq: Once | INTRAMUSCULAR | Status: AC
  Administered 2016-02-27: 10 mL
  Filled 2016-02-27: qty 10

## 2016-02-27 MED ORDER — IBUPROFEN 400 MG PO TABS: 400.0000 mg | ORAL_TABLET | Freq: Four times a day (QID) | ORAL | 0 refills | Status: DC | PRN

## 2016-02-27 MED ORDER — TRAMADOL HCL 50 MG PO TABS: 100.0000 mg | ORAL_TABLET | Freq: Four times a day (QID) | ORAL | 0 refills | Status: DC | PRN

## 2016-02-27 NOTE — Discharge Instructions (Signed)
Keep the wound clean and dry. Keep the wound covered. Avoid sun light.  Unfortunately, you will likely have a scar due to the type of laceration you suffered - so emphasis should be on keeping the scar as little as possible.

## 2016-02-28 ENCOUNTER — Encounter (HOSPITAL_COMMUNITY): Payer: Self-pay | Admitting: Emergency Medicine

## 2016-02-28 ENCOUNTER — Emergency Department (HOSPITAL_COMMUNITY)
Admission: EM | Admit: 2016-02-28 | Discharge: 2016-02-29 | Disposition: A | Discharge status: Home / Self Care | Payer: BLUE CROSS/BLUE SHIELD | Attending: Emergency Medicine | Admitting: Emergency Medicine

## 2016-02-28 DIAGNOSIS — J4520 Mild intermittent asthma, uncomplicated: Secondary | ICD-10-CM | POA: Insufficient documentation

## 2016-02-28 DIAGNOSIS — S0081XD Abrasion of other part of head, subsequent encounter: Secondary | ICD-10-CM

## 2016-02-28 DIAGNOSIS — Z79899 Other long term (current) drug therapy: Secondary | ICD-10-CM | POA: Diagnosis not present

## 2016-02-28 DIAGNOSIS — W109XXD Fall (on) (from) unspecified stairs and steps, subsequent encounter: Secondary | ICD-10-CM | POA: Insufficient documentation

## 2016-02-28 DIAGNOSIS — S0993XD Unspecified injury of face, subsequent encounter: Secondary | ICD-10-CM | POA: Diagnosis present

## 2016-02-28 NOTE — ED Triage Notes (Signed)
Pt reports falling on 02/26/16 and was seen in ED for injuries and then today began having changes in vision that is not baseline for pt. Pt stated that she had trouble focusing. And last pain medication taken was at 1600.

## 2016-02-28 NOTE — ED Provider Notes (Signed)
[facility]-EMERGENCY DEPT Provider Note   [name]: [id number] Arrival date & time: 02/28/16  2238  By signing my name below, I, Majel Homer, attest that this documentation has been prepared under the direction and in the presence of Geoffery Lyons, MD . Electronically Signed: Majel Homer, Scribe. 02/29/2016. 12:07 AM.  History   Chief Complaint Chief Complaint  Patient presents with  . Visual Field Change   The history is provided by the patient. No language interpreter was used.   HPI Comments: Erika Mccoy is a 50 y.o. female with PMHx of MS and fibromyalgia, who presents to the Emergency Department complaining of gradual onset, headache and left sided facial swelling s/p a fall that occurred 2 days ago. Pt reports she "had too many shots of tequila" at a party 2 days ago and tripped while walking up concrete stairs. She notes she fell directly on her face and did not use anything to brace her fall. She states she lost consciousness for an unknown amount of time and does not remember anything except that she woke up "mad." She reports she visited the ED soon after this incident on 10/13 and received multiple sutures to her nose and forehead. Pt states she initially experienced a mild headache after her fall and swelling around her nose but it had resolved at the time of her discharge. She states she woke up this morning and noticed her headache had worsened with left sided facial swelling that was not present before. She notes her headache is centralized to her forehead and radiates to the top and bilateral sides of her head. She states associated left ear pain, jaw pain and slurred speech secondary to her jaw pain. She denies any dental changes. Pt reports she has taken her husband's Bactroban over her wounds but would like her own prescription.   Past Medical History:  Diagnosis Date  . Abnormal Pap smear of cervix 1997   --hx of conization of cervix by Dr. Roberto Scales  . Anxiety   . Arm sprain 5/11   right   . Broken arm    left arm by elbow  . Fibromyalgia   . Genital warts   . History of self mutilation   . HSV-2 infection    rare occurence  . 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 IBS (irritable bowel syndrome)   . Interstitial cystitis   . Lichen sclerosus    Vulva  . Manic depression (HCC)   . MS (multiple sclerosis) (HCC)     Patient Active Problem List   Diagnosis Date Noted  . Adverse food reaction 01/11/2016  . Mild persistent asthma 01/11/2016  . Chronic rhinitis 01/11/2016  . Non compliance w medication regimen 01/11/2016  . Bipolar disorder, manic (HCC) 05/05/2015  . Fothergill's neuralgia 01/15/2014  . Rotator cuff syndrome 07/15/2013  . ANA positive 03/12/2013  . PCB (post coital bleeding) 01/18/2013  . Lichen sclerosus 12/03/2012  . Irritable bowel syndrome 12/03/2012  . Interstitial cystitis 12/03/2012  . Hypertriglyceridemia 10/25/2011  . Allergic rhinitis, seasonal 09/28/2011  . Adhesions of vagina 08/22/2011  . 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 Acne 12/21/2010  . Bipolar 1 disorder (HCC) 12/09/2010  . 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 Fibrositis 12/09/2010  . Anxiety, generalized 12/09/2010  . 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 BREAST ENHANCEMENT SURGERY  1990   Saline Implants  . CERVIX LESION DESTRUCTION  1997   Dr. Roberto Scales  .  ENDOMETRIAL ABLATION  1997/1998  . NASAL SINUS SURGERY    . TONSILLECTOMY      OB History    Gravida Para Term Preterm AB Living   2 2       3    SAB TAB Ectopic Multiple Live Births         1 3     Home Medications    Prior to Admission medications   Medication Sig Start Date End Date Taking? Authorizing Provider  BIOTIN 5000 PO Take 1 tablet by mouth.   Yes Historical Provider, MD  buPROPion (WELLBUTRIN XL) 300 MG 24 hr tablet Take 300 mg by mouth every morning.  01/10/16  Yes Historical Provider, MD  Cholecalciferol (D3-1000) 1000 units capsule  Take 5,000 Units by mouth daily.   Yes Historical Provider, MD  clonazePAM (KLONOPIN) 1 MG tablet Take 1 tablet (1 mg total) by mouth 2 (two) times daily. Patient taking differently: Take 1 mg by mouth 2 (two) times daily as needed for anxiety.  05/10/15  Yes Beau Fanny, FNP  dexlansoprazole (DEXILANT) 60 MG capsule Take 60 mg by mouth daily.   Yes Historical Provider, MD  Fe Bisgly-Succ-C-Thre-B12-FA (IRON-150 PO) Take 1 tablet by mouth.   Yes Historical Provider, MD  hydrOXYzine (ATARAX/VISTARIL) 25 MG tablet Take 50 tablets by mouth at bedtime.  06/13/15  Yes Historical Provider, MD  lamoTRIgine (LAMICTAL) 150 MG tablet Take 300 mg by mouth every morning. 02/17/16  Yes Historical Provider, MD  LINZESS 290 MCG CAPS capsule Take 290 mcg by mouth every morning. 01/29/16  Yes Historical Provider, MD  ondansetron (ZOFRAN ODT) 4 MG disintegrating tablet Take 1 tablet (4 mg total) by mouth every 8 (eight) hours as needed for nausea or vomiting. 02/27/16  Yes Derwood Kaplan, MD  Prenatal Vit-Fe Fumarate-FA (PRENATAL MULTIVITAMIN) TABS tablet Take 1 tablet by mouth daily at 12 noon.   Yes Historical Provider, MD  Probiotic Product (PROBIOTIC ADVANCED PO) Take 1 tablet by mouth every morning.   Yes Historical Provider, MD  traMADol (ULTRAM) 50 MG tablet Take 2 tablets (100 mg total) by mouth every 6 (six) hours as needed. Patient taking differently: Take 100 mg by mouth every 6 (six) hours as needed for moderate pain.  02/27/16  Yes Derwood Kaplan, MD  traZODone (DESYREL) 100 MG tablet Take 2 tablets (200 mg total) by mouth at bedtime. 05/10/15  Yes Beau Fanny, FNP  cephALEXin (KEFLEX) 500 MG capsule Take 500 mg by mouth 3 (three) times daily. 02/23/16   Historical Provider, MD  ibuprofen (ADVIL,MOTRIN) 400 MG tablet Take 1 tablet (400 mg total) by mouth every 6 (six) hours as needed. Patient not taking: Reported on 02/28/2016 02/27/16   Derwood Kaplan, MD  polyethylene glycol (MIRALAX / GLYCOLAX)  packet Take 17 g by mouth daily as needed for mild constipation. Patient not taking: Reported on 02/28/2016 05/10/15   Beau Fanny, FNP    Family History Family History  Problem Relation Age of Onset  . Hypertension Mother   . Hyperlipidemia Mother   . Lung cancer Mother   . Thyroid cancer Mother   . COPD Mother   . Lung cancer Father 12  . Heart attack Father 50    3 vessel CABG  . Leukemia Maternal Grandmother   . Stroke Maternal Grandfather   . Stroke Paternal Grandmother   . Heart failure Paternal Grandfather   . Thyroid disease Maternal Aunt   . Osteoarthritis Maternal Aunt     Social  History Social History  Substance Use Topics  . Smoking status: Never Smoker  . Smokeless tobacco: Never Used  . Alcohol use 0.0 oz/week     Comment: 1 glass of red wine nightly     Allergies   Neomycin-bacitracin-polymyxin  [bacitracin-neomycin-polymyxin]; Quinolones; Benzalkonium chloride; Cefzil [cefprozil]; Cephalexin; Ciprofloxacin; Ibuprofen; Levofloxacin; Lidocaine; Monistat [miconazole]; Neosporin [neomycin-bacitracin zn-polymyx]; Quinine; Septra [sulfamethoxazole-trimethoprim]; Valtrex [valacyclovir hcl]; and Adhesive [tape]   Review of Systems Review of Systems  HENT: Positive for ear pain and facial swelling. Negative for dental problem.   Skin: Positive for wound.  Neurological: Positive for speech difficulty and headaches.   Physical Exam Updated Vital Signs BP 116/80 (BP Location: Right Arm)   Pulse 85   Temp 98.7 F (37.1 C) (Oral)   Resp 17   Ht 5\' 6"  (1.676 m)   Wt 130 lb (59 kg)   LMP 02/07/2016 Comment: BRB x 7 days  SpO2 95%   BMI 20.98 kg/m   Physical Exam  Constitutional: She is oriented to person, place, and time. She appears well-developed and well-nourished.  HENT:  Head: Normocephalic.  Right Ear: Tympanic membrane normal.  Left Ear: Tympanic membrane normal.  Mouth/Throat: Oropharynx is clear and moist.  There are multiple abrasions to  the bridge of nose, left cheek and below the nose. The area under the left nostril appears to have yellow/green drainage. There is also tenderness to the left jaw. Able to open and close her mouth without difficulty.   Eyes: EOM are normal.  Neck: Normal range of motion.  Pulmonary/Chest: Effort normal.  Abdominal: She exhibits no distension.  Musculoskeletal: Normal range of motion.  Neurological: She is alert and oriented to person, place, and time. No cranial nerve deficit.  Psychiatric: She has a normal mood and affect.  Nursing note and vitals reviewed.  ED Treatments / Results  Labs (all labs ordered are listed, but only abnormal results are displayed) Labs Reviewed - No data to display  EKG  EKG Interpretation None       Radiology Ct Head Wo Contrast  Result Date: 02/27/2016 CLINICAL DATA:  Larey Seat down steps after drinking large amount of alcohol EXAM: CT HEAD WITHOUT CONTRAST CT CERVICAL SPINE WITHOUT CONTRAST TECHNIQUE: Multidetector CT imaging of the head and cervical spine was performed following the standard protocol without intravenous contrast. Multiplanar CT image reconstructions of the cervical spine were also generated. COMPARISON:  CT brain 722 1000 seconds FINDINGS: CT HEAD FINDINGS Brain: No evidence of acute infarction, hemorrhage, hydrocephalus, extra-axial collection or mass lesion/mass effect. Vascular: Mild calcification within the carotid arteries at the skullbase. No hyperdense vessels. Skull: Mastoid air cells clear.  No skull fracture identified. Sinuses/Orbits: Mild mucosal thickening in the ethmoid sinuses. Orbits appear intact. Other: None CT CERVICAL SPINE FINDINGS Alignment: There is straightening of the cervical spine. No subluxation is evident. Skull base and vertebrae: Craniovertebral junction is intact. Vertebral bodies demonstrate normal stature. No fracture is visualized. Soft tissues and spinal canal: Prevertebral soft tissue thickness appears normal.  No bony canal compromise. Disc levels:  Minimal degenerative changes at C4-C5 and C5-C6. Upper chest: Lung apices clear.  Thyroid gland is normal. Other: None IMPRESSION: 1. No CT evidence for acute intracranial abnormality. 2. Straightening of the cervical spine. No acute fracture or malalignment. Electronically Signed   By: Jasmine Pang M.D.   On: 02/27/2016 00:10   Ct Cervical Spine Wo Contrast  Result Date: 02/27/2016 CLINICAL DATA:  Larey Seat down steps after drinking large amount of alcohol EXAM:  CT HEAD WITHOUT CONTRAST CT CERVICAL SPINE WITHOUT CONTRAST TECHNIQUE: Multidetector CT imaging of the head and cervical spine was performed following the standard protocol without intravenous contrast. Multiplanar CT image reconstructions of the cervical spine were also generated. COMPARISON:  CT brain 722 1000 seconds FINDINGS: CT HEAD FINDINGS Brain: No evidence of acute infarction, hemorrhage, hydrocephalus, extra-axial collection or mass lesion/mass effect. Vascular: Mild calcification within the carotid arteries at the skullbase. No hyperdense vessels. Skull: Mastoid air cells clear.  No skull fracture identified. Sinuses/Orbits: Mild mucosal thickening in the ethmoid sinuses. Orbits appear intact. Other: None CT CERVICAL SPINE FINDINGS Alignment: There is straightening of the cervical spine. No subluxation is evident. Skull base and vertebrae: Craniovertebral junction is intact. Vertebral bodies demonstrate normal stature. No fracture is visualized. Soft tissues and spinal canal: Prevertebral soft tissue thickness appears normal. No bony canal compromise. Disc levels:  Minimal degenerative changes at C4-C5 and C5-C6. Upper chest: Lung apices clear.  Thyroid gland is normal. Other: None IMPRESSION: 1. No CT evidence for acute intracranial abnormality. 2. Straightening of the cervical spine. No acute fracture or malalignment. Electronically Signed   By: Jasmine PangKim  Fujinaga M.D.   On: 02/27/2016 00:10     Procedures Procedures (including critical care time)  Medications Ordered in ED Medications - No data to display  DIAGNOSTIC STUDIES:  Oxygen Saturation is 95% on RA, normal by my interpretation.    COORDINATION OF CARE:  12:04 AM Discussed treatment plan with pt at bedside and pt agreed to plan.  Initial Impression / Assessment and Plan / ED Course  I have reviewed the triage vital signs and the nursing notes.  Pertinent labs & imaging results that were available during my care of the patient were reviewed by me and considered in my medical decision making (see chart for details).  Clinical Course    Patient presents for evaluation of facial injuries sustained during a fall 2 days ago while she was intoxicated. She reports walking up concrete steps and losing her balance. She caused lacerations to her forehead and nose which were apparent 2 nights ago. She also had imaging of her head and cervical spine which were unremarkable. She returns today with complaints of swelling in the left side of her jaw and concerns of the appearance of the abrasions to her face.  There appears to be what may be a mild cellulitis as there is a yellow appearance to the drainage to the abrasion below her nose. She will be prescribed oral antibiotics and Bactroban ointment. To return prn. I see no indication for further imaging or other workup at this time.  I personally performed the services described in this documentation, which was scribed in my presence. The recorded information has been reviewed and is accurate.   Final Clinical Impressions(s) / ED Diagnoses   Final diagnoses:  None    New Prescriptions New Prescriptions   No medications on file     Geoffery Lyonsouglas Ashyra Cantin, MD 02/29/16 [phone]

## 2016-02-29 MED ORDER — MUPIROCIN 2 % EX OINT: 1.0000 "application " | TOPICAL_OINTMENT | Freq: Two times a day (BID) | CUTANEOUS | 0 refills | Status: DC

## 2016-02-29 MED ORDER — CLINDAMYCIN HCL 300 MG PO CAPS: 300.0000 mg | ORAL_CAPSULE | Freq: Four times a day (QID) | ORAL | 0 refills | Status: DC

## 2016-02-29 NOTE — Discharge Instructions (Signed)
Clindamycin as prescribed.  Bactroban ointment: Apply locally twice daily.  Follow-up with your primary Dr. if not improving in the next week.

## 2016-03-18 ENCOUNTER — Telehealth: Payer: Self-pay | Admitting: Nurse Practitioner

## 2016-03-18 NOTE — Telephone Encounter (Signed)
Patient has a lesion on her upper left thigh she would like evaluated.

## 2016-03-18 NOTE — Telephone Encounter (Signed)
Returned call to patient. Patient states she noticed a lesion on her left thigh last night. Patient states it is under the skin and pea-sized. Patient states there is no color to it, but it looks like it has an indention. RN asked if patient had PCP, but she states, "yeah- but she is not used to looking at things like this, but you guys are." Patient requesting appointment with Dr. Hyacinth Meeker while Ria Comment, FNP is out of the office. Office visit scheduled for Monday 03/21/16 at 1115 with Dr. Hyacinth Meeker. Patient agreeable to date and time of appointment. Patient aware to be seen at ER or Urgent Care over the weekend if symptoms worsen.    Routing to provider for final review. Patient agreeable to disposition. Will close encounter.    Cc Ria Comment, FNP

## 2016-03-21 ENCOUNTER — Ambulatory Visit (INDEPENDENT_AMBULATORY_CARE_PROVIDER_SITE_OTHER): Payer: BLUE CROSS/BLUE SHIELD | Admitting: Obstetrics & Gynecology

## 2016-03-21 ENCOUNTER — Encounter: Payer: Self-pay | Admitting: Obstetrics & Gynecology

## 2016-03-21 VITALS — BP 112/72 | HR 74 | Resp 14 | Ht 64.5 in | Wt 130.0 lb

## 2016-03-21 DIAGNOSIS — N904 Leukoplakia of vulva: Secondary | ICD-10-CM | POA: Diagnosis not present

## 2016-03-21 DIAGNOSIS — Z202 Contact with and (suspected) exposure to infections with a predominantly sexual mode of transmission: Secondary | ICD-10-CM | POA: Diagnosis not present

## 2016-03-21 NOTE — Progress Notes (Signed)
GYNECOLOGY  VISIT   HPI: 50 y.o. G2P2 Married Caucasian female with desires to have STD testing.  Denies vaginal discharge and itching.  Had a fall three weeks ago and now has two scars on her face she'd like me to look at today.  Went to ER and had CT of head.  Had a concussion.  Reviewed today with pt.    Having marital issues.  Is doing more cutting on her left ankle.  She is seeing her therapist and psychiatrist.  Had recent manic episode and then had extra-marital relationship.  Is "beating herself up" over this.  Also, she has a small area on inside left thigh she wants me to look at today.     GYNECOLOGIC HISTORY: Patient's last menstrual period was 01/15/2016 (approximate). Contraception: none  Patient Active Problem List   Diagnosis Date Noted  . Adverse food reaction 01/11/2016  . Mild persistent asthma 01/11/2016  . Chronic rhinitis 01/11/2016  . Non compliance w medication regimen 01/11/2016  . Bipolar disorder, manic (HCC) 05/05/2015  . Fothergill's neuralgia 01/15/2014  . Rotator cuff syndrome 07/15/2013  . ANA positive 03/12/2013  . PCB (post coital bleeding) 01/18/2013  . Lichen sclerosus 12/03/2012  . Irritable bowel syndrome 12/03/2012  . Interstitial cystitis 12/03/2012  . Hypertriglyceridemia 10/25/2011  . Allergic rhinitis, seasonal 09/28/2011  . Adhesions of vagina 08/22/2011  . 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 Acne 12/21/2010  . Bipolar 1 disorder (HCC) 12/09/2010  . 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 Fibrositis 12/09/2010  . Anxiety, generalized 12/09/2010  . 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 Abnormal Pap smear of cervix 1997   --hx of conization of cervix by Dr. Roberto Scales  . Anxiety   . Arm sprain 5/11   right   . Broken arm    left arm by elbow  . Fibromyalgia   . Genital warts   . History of self mutilation   . HSV-2 infection    rare occurence  . 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 IBS  (irritable bowel syndrome)   . Interstitial cystitis   . Lichen sclerosus    Vulva  . Manic depression (HCC)   . MS (multiple sclerosis) (HCC)     Past Surgical History:  Procedure Laterality Date  . BREAST ENHANCEMENT SURGERY  1990   Saline Implants  . CERVIX LESION DESTRUCTION  1997   Dr. Roberto Scales  . ENDOMETRIAL ABLATION  1997/1998  . NASAL SINUS SURGERY    . TONSILLECTOMY      MEDS:  Reviewed in EPIC and UTD  ALLERGIES: Ibuprofen; Neomycin-bacitracin-polymyxin  [bacitracin-neomycin-polymyxin]; Quinolones; Benzalkonium chloride; Cefzil [cefprozil]; Cephalexin; Ciprofloxacin; Levofloxacin; Lidocaine; Monistat [miconazole]; Neosporin [neomycin-bacitracin zn-polymyx]; Quinine; Septra [sulfamethoxazole-trimethoprim]; Valtrex [valacyclovir hcl]; and Adhesive [tape]  Family History  Problem Relation Age of Onset  . Hypertension Mother   . Hyperlipidemia Mother   . Lung cancer Mother   . Thyroid cancer Mother   . COPD Mother   . Lung cancer Father 69  . Heart attack Father 50    3 vessel CABG  . Leukemia Maternal Grandmother   . Stroke Maternal Grandfather   . Stroke Paternal Grandmother   . Heart failure Paternal Grandfather   . Thyroid disease Maternal Aunt   . Osteoarthritis Maternal Aunt     SH:  Married, non smoker  Review of Systems  All other systems reviewed and are negative.  PHYSICAL EXAMINATION:    BP 112/72 (BP  Location: Right Arm, Patient Position: Sitting, Cuff Size: Normal)   Pulse 74   Resp 14   Ht 5' 4.5" (1.638 m)   Wt 130 lb (59 kg)   LMP 01/15/2016 (Approximate)   BMI 21.97 kg/m     General appearance: alert, cooperative and appears stated age Abdomen: soft, non-tender; bowel sounds normal; no masses,  no organomegaly Thigh:  On inner left thigh, area pt is feeling is either a fat lobule or small lipoma.  No skin lesion noted.  Pelvic: External genitalia: small area of hypopigmentation around clitoris and along left               Urethra:   normal appearing urethra with no masses, tenderness or lesions              Bartholins and Skenes: normal                 Vagina: normal appearing vagina with normal color and discharge, no lesions              Cervix: no lesions              Bimanual Exam:  Uterus:  normal size, contour, position, consistency, mobility, non-tender              Adnexa: no mass, fullness, tenderness              Anus:  No lesions  Chaperone was present for exam.  Assessment: Desires STD testing H/o LS&A Facial lacerations with possible keloid formation, 283 weeks old  Plan: GC/Chl HIV, RPR, Hep BSAg, Hep C antibody pending Pt will use clobetasol 0.05% bid for the next 10 days.  Pt has Rx.

## 2016-03-22 LAB — GC/CHLAMYDIA PROBE AMP
CT Probe RNA: NOT DETECTED
GC PROBE AMP APTIMA: NOT DETECTED

## 2016-03-22 LAB — STD PANEL
HIV 1&2 Ab, 4th Generation: NONREACTIVE
Hepatitis B Surface Ag: NEGATIVE

## 2016-03-22 LAB — HEPATITIS C ANTIBODY: HCV AB: NEGATIVE

## 2016-04-04 ENCOUNTER — Ambulatory Visit: Payer: BLUE CROSS/BLUE SHIELD | Admitting: Allergy & Immunology

## 2016-04-11 ENCOUNTER — Encounter: Payer: Self-pay | Admitting: Obstetrics & Gynecology

## 2016-04-11 ENCOUNTER — Ambulatory Visit (INDEPENDENT_AMBULATORY_CARE_PROVIDER_SITE_OTHER): Payer: BLUE CROSS/BLUE SHIELD | Admitting: Obstetrics & Gynecology

## 2016-04-11 VITALS — BP 100/60 | HR 82 | Resp 14 | Ht 64.5 in | Wt 129.6 lb

## 2016-04-11 DIAGNOSIS — N898 Other specified noninflammatory disorders of vagina: Secondary | ICD-10-CM

## 2016-04-11 MED ORDER — FLUCONAZOLE 150 MG PO TABS: 150.0000 mg | ORAL_TABLET | Freq: Once | ORAL | 0 refills | Status: AC

## 2016-04-11 NOTE — Progress Notes (Signed)
GYNECOLOGY  VISIT   HPI: 50 y.o. G2P2 Married Caucasian female here for complaint of increased vaginal discharge for about five days.  She also notes a "blister on the left vulva" that has improved with clobetasol.  She feels uncomfortable with tighter jeans.  No new sexual partners so does not feel STD testing is warranted.  Pt having issues with bacterial infection on face.  Has been on antibiotics for two weeks.  Didn't think that this could cause yeast.    No vaginal bleeding with current issues.  Denies pelvic pain or urinary issues as well.   GYNECOLOGIC HISTORY: Patient's last menstrual period was 01/15/2016 (approximate).  Patient Active Problem List   Diagnosis Date Noted  . Adverse food reaction 01/11/2016  . Mild persistent asthma 01/11/2016  . Chronic rhinitis 01/11/2016  . Non compliance w medication regimen 01/11/2016  . Bipolar disorder, manic (HCC) 05/05/2015  . Fothergill's neuralgia 01/15/2014  . Rotator cuff syndrome 07/15/2013  . ANA positive 03/12/2013  . PCB (post coital bleeding) 01/18/2013  . Lichen sclerosus 12/03/2012  . Irritable bowel syndrome 12/03/2012  . Interstitial cystitis 12/03/2012  . Hypertriglyceridemia 10/25/2011  . Allergic rhinitis, seasonal 09/28/2011  . Adhesions of vagina 08/22/2011  . 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 Acne 12/21/2010  . Bipolar 1 disorder (HCC) 12/09/2010  . 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 Fibrositis 12/09/2010  . Anxiety, generalized 12/09/2010  . 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 Abnormal Pap smear of cervix 1997   --hx of conization of cervix by Dr. Roberto ScalesMabry  . Anxiety   . Arm sprain 5/11   right   . Broken arm    left arm by elbow  . Fibromyalgia   . Genital warts   . History of self mutilation   . HSV-2 infection    rare occurence  . 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 IBS (irritable bowel syndrome)   . Interstitial cystitis   . Lichen sclerosus     Vulva  . Manic depression (HCC)   . MS (multiple sclerosis) (HCC)     Past Surgical History:  Procedure Laterality Date  . BREAST ENHANCEMENT SURGERY  1990   Saline Implants  . CERVIX LESION DESTRUCTION  1997   Dr. Roberto ScalesMabry  . ENDOMETRIAL ABLATION  1997/1998  . NASAL SINUS SURGERY    . TONSILLECTOMY      MEDS:  Reviewed in EPIC and UTD  ALLERGIES: Ibuprofen; Neomycin-bacitracin-polymyxin  [bacitracin-neomycin-polymyxin]; Quinolones; Benzalkonium chloride; Cefzil [cefprozil]; Cephalexin; Ciprofloxacin; Levofloxacin; Lidocaine; Monistat [miconazole]; Neosporin [neomycin-bacitracin zn-polymyx]; Quinine; Septra [sulfamethoxazole-trimethoprim]; Valtrex [valacyclovir hcl]; and Adhesive [tape]  Family History  Problem Relation Age of Onset  . Hypertension Mother   . Hyperlipidemia Mother   . Lung cancer Mother   . Thyroid cancer Mother   . COPD Mother   . Lung cancer Father [id number]  . Heart attack Father 50    3 vessel CABG  . Leukemia Maternal Grandmother   . Stroke Maternal Grandfather   . Stroke Paternal Grandmother   . Heart failure Paternal Grandfather   . Thyroid disease Maternal Aunt   . Osteoarthritis Maternal Aunt     SH:  Married, non smoker  Review of Systems  Constitutional: Negative.   Gastrointestinal: Positive for diarrhea. Constipation: due to medication she is taking.  Plans on calling GI today.  Genitourinary: Negative.   Skin:       Bacterial infection on fact    PHYSICAL  EXAMINATION:    BP 100/60 (BP Location: Right Arm, Patient Position: Sitting, Cuff Size: Normal)   Pulse 82   Resp 14   Ht 5' 4.5" (1.638 m)   Wt 129 lb 9.6 oz (58.8 kg)   LMP 01/15/2016 (Approximate)   BMI 21.90 kg/m     General appearance: alert, cooperative and appears stated age Abdomen: soft, non-tender; bowel sounds normal; no masses,  no organomegaly  Pelvic: External genitalia:  no lesions, specifically no blister or lesion noted              Urethra:  normal appearing  urethra with no masses, tenderness or lesions              Bartholins and Skenes: normal                 Vagina: vaginal mucosal erythema noted with white vaginal discharge              Cervix: no lesions              Bimanual Exam:  Uterus:  normal size, contour, position, consistency, mobility, non-tender              Adnexa: no mass, fullness, tenderness              Anus:  Perirectal erythema noted  Chaperone was present for exam.  Assessment: Vaginal discharge Recent antibiotic use Possible yeast vaginitis H/O LS&A  Plan: Affirm pending Difucan 150mg  po x 1, repeat 72 hours.  #2/0RF.

## 2016-04-12 LAB — WET PREP BY MOLECULAR PROBE
Candida species: NEGATIVE
Gardnerella vaginalis: NEGATIVE
TRICHOMONAS VAG: NEGATIVE

## 2016-04-18 ENCOUNTER — Telehealth: Payer: Self-pay | Admitting: *Deleted

## 2016-04-18 MED ORDER — FLUCONAZOLE 150 MG PO TABS: ORAL_TABLET | ORAL | 0 refills | Status: DC

## 2016-04-18 NOTE — Telephone Encounter (Signed)
Patient notified of negative affirm testing. Verbalized understanding. Patient requesting refill for diflucan due to being on prolonged antibiotics. Will finish antibiotics today. RN spoke with Dr. Hyacinth Meeker & per Dr. Hyacinth Meeker okay for diflucan 2 pills 0 refills and patient to call if symptoms do not resolve. Patient agreeable. Pharmacy on file verified and prescription sent to pharmacy.    Routing to provider for final review. Patient agreeable to disposition. Will close encounter.

## 2016-04-25 ENCOUNTER — Telehealth: Payer: Self-pay | Admitting: Obstetrics & Gynecology

## 2016-04-25 NOTE — Telephone Encounter (Signed)
Spoke with patient. Patient states her psychologic issues are severe right now. Patient states she is feeling hot inside of her head and inside of her neck. Patient states she takes trazodone and is waking up in the middle of the night and at 6am, when she should be sleeping through the night with this medication. Patient states the symptoms are related to menopause. Patient states she did not mention it at last OV 11/27,  reports other things were going on at that time. Patient states she is a total "bitch". Patient states she did not want to admit menopause was happening has talked to therapist about menopause and is now ready to talk to Dr. Hyacinth MeekerMiller. Patient reports these symptoms have been ongoing for approximately 1-1.5 months. Patient states she has increased irritability and is angry. Patient states she has had no cycle for the last 4 months and no libido. RN inquired if patient seen by therapist recently to discuss current medications, patient states I have been on these meds for 5 years it is not related to meds or my brain. Patient states it is not normal to feel like you are going crazy and then chills. Recommended OV for further evaluation, offered appt for 12/11 at 3:30pm, patient declined due to car being in shop. Patient scheduled for 05/03/16 at 2:30pm with Dr. Hyacinth MeekerMiller. Advised patient will review with Dr. Hyacinth MeekerMiller and return call with any additional recommendations. Patient verbalizes understanding and is agreeable to date and time.   Dr. Hyacinth MeekerMiller, any additional recommendations?

## 2016-04-25 NOTE — Telephone Encounter (Signed)
Patient is having menopausal issues. °

## 2016-04-29 NOTE — Telephone Encounter (Signed)
No additional recommendations.  Encounter closed.  

## 2016-05-03 ENCOUNTER — Encounter: Payer: Self-pay | Admitting: Obstetrics & Gynecology

## 2016-05-03 ENCOUNTER — Ambulatory Visit: Payer: Self-pay | Admitting: Obstetrics & Gynecology

## 2016-05-04 ENCOUNTER — Encounter: Payer: Self-pay | Admitting: Obstetrics & Gynecology

## 2016-05-12 ENCOUNTER — Ambulatory Visit (INDEPENDENT_AMBULATORY_CARE_PROVIDER_SITE_OTHER): Payer: BLUE CROSS/BLUE SHIELD | Admitting: Obstetrics & Gynecology

## 2016-05-12 ENCOUNTER — Encounter: Payer: Self-pay | Admitting: Obstetrics & Gynecology

## 2016-05-12 VITALS — BP 110/72 | HR 94 | Resp 16 | Ht 64.5 in | Wt 129.2 lb

## 2016-05-12 DIAGNOSIS — L298 Other pruritus: Secondary | ICD-10-CM | POA: Diagnosis not present

## 2016-05-12 DIAGNOSIS — N898 Other specified noninflammatory disorders of vagina: Secondary | ICD-10-CM

## 2016-05-12 DIAGNOSIS — N951 Menopausal and female climacteric states: Secondary | ICD-10-CM | POA: Diagnosis not present

## 2016-05-12 MED ORDER — ESTROGENS, CONJUGATED 0.625 MG/GM VA CREA: TOPICAL_CREAM | VAGINAL | 1 refills | Status: DC

## 2016-05-12 NOTE — Progress Notes (Signed)
GYNECOLOGY  VISIT   HPI: 50 y.o. G2P2 Married Caucasian female here to discuss menopausal symptoms.  She reports having sensation of heat from the inside like she is having hot flashes.  She states she feels more emotional as well.  She did have a cycle in December but she has skipped several cycles this year.  She thinks the last cycle before this month was three or four months ago.  She continues to have some issues with vulvar irritation but she has lichen sclerosus so sometimes it is hard to tell for her.  Would like testing today.  Denies vaginal discharge.    Patient Active Problem List   Diagnosis Date Noted  . Adverse food reaction 01/11/2016  . Mild persistent asthma 01/11/2016  . Chronic rhinitis 01/11/2016  . Non compliance w medication regimen 01/11/2016  . Bipolar disorder, manic (HCC) 05/05/2015  . Fothergill's neuralgia 01/15/2014  . Rotator cuff syndrome 07/15/2013  . ANA positive 03/12/2013  . PCB (post coital bleeding) 01/18/2013  . Lichen sclerosus 12/03/2012  . Irritable bowel syndrome 12/03/2012  . Interstitial cystitis 12/03/2012  . Hypertriglyceridemia 10/25/2011  . Allergic rhinitis, seasonal 09/28/2011  . Adhesions of vagina 08/22/2011  . 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 Acne 12/21/2010  . Bipolar 1 disorder (HCC) 12/09/2010  . 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 Fibrositis 12/09/2010  . Anxiety, generalized 12/09/2010  . 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 Abnormal Pap smear of cervix 1997   --hx of conization of cervix by Dr. Roberto Scales  . Anxiety   . Arm sprain 5/11   right   . Broken arm    left arm by elbow  . Fibromyalgia   . Genital warts   . History of self mutilation   . HSV-2 infection    rare occurence  . 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 IBS (irritable bowel syndrome)   . Interstitial cystitis   . Lichen sclerosus    Vulva  . Manic depression (HCC)   . MS (multiple sclerosis) (HCC)      Past Surgical History:  Procedure Laterality Date  . BREAST ENHANCEMENT SURGERY  1990   Saline Implants  . CERVIX LESION DESTRUCTION  1997   Dr. Roberto Scales  . ENDOMETRIAL ABLATION  1997/1998  . NASAL SINUS SURGERY    . TONSILLECTOMY      MEDS:  Reviewed in EPIC and UTD  ALLERGIES: Ibuprofen; Neomycin-bacitracin-polymyxin  [bacitracin-neomycin-polymyxin]; Quinolones; Bactroban [mupirocin]; Benzalkonium chloride; Cefzil [cefprozil]; Cephalexin; Ciprofloxacin; Levofloxacin; Lidocaine; Monistat [miconazole]; Neosporin [neomycin-bacitracin zn-polymyx]; Quinine; Septra [sulfamethoxazole-trimethoprim]; Valtrex [valacyclovir hcl]; and Adhesive [tape]  Family History  Problem Relation Age of Onset  . Hypertension Mother   . Hyperlipidemia Mother   . Lung cancer Mother   . Thyroid cancer Mother   . COPD Mother   . Lung cancer Father 59  . Heart attack Father 50    3 vessel CABG  . Leukemia Maternal Grandmother   . Stroke Maternal Grandfather   . Stroke Paternal Grandmother   . Heart failure Paternal Grandfather   . Thyroid disease Maternal Aunt   . Osteoarthritis Maternal Aunt     SH:  Married, non smoker  Review of Systems  All other systems reviewed and are negative.   PHYSICAL EXAMINATION:    BP 110/72 (BP Location: Right Arm, Patient Position: Sitting, Cuff Size: Normal)   Pulse 94   Resp 16   Ht 5' 4.5" (1.638 m)  Wt 129 lb 3.2 oz (58.6 kg)   LMP  (Within Months) Comment: 03/2016   BMI 21.83 kg/m     General appearance: alert, cooperative and appears stated age  Pelvic: External genitalia:  Small area of hypopigmentation above the clitoris, o/w no significant changes of LS&A noted              Urethra:  normal appearing urethra with no masses, tenderness or lesions              Bartholins and Skenes: normal                 Vagina: normal appearing vagina with normal color and discharge, no lesions              Cervix: no lesions              Bimanual Exam:   Uterus:  normal size, contour, position, consistency, mobility, non-tender              Adnexa: no mass, fullness, tenderness              Rectovaginal: No..  Confirms.              Anus:  normal sphincter tone, no lesions  Chaperone was present for exam.  Assessment: Vaginal itching Oligomenorrhea with associated menopausal symptoms  Plan: FSH and estardiol level today.  We discussed using a patch for HRT if appropriate Affirm pending. Start premarin vaginal cream 1/2 gram pv twice weekly.   ~15 minutes spent with patient >50% of time was in face to face discussion of above.

## 2016-05-13 LAB — WET PREP BY MOLECULAR PROBE
Candida species: NEGATIVE
GARDNERELLA VAGINALIS: NEGATIVE
TRICHOMONAS VAG: NEGATIVE

## 2016-05-13 LAB — ESTRADIOL: Estradiol: 108 pg/mL

## 2016-05-13 LAB — FOLLICLE STIMULATING HORMONE: FSH: 9.5 m[IU]/mL

## 2016-05-16 HISTORY — PX: BREAST ENHANCEMENT SURGERY: SHX7

## 2016-05-17 ENCOUNTER — Telehealth: Payer: Self-pay

## 2016-05-17 NOTE — Telephone Encounter (Signed)
-----   Message from Jerene Bears, MD sent at 05/17/2016 11:10 AM EST ----- Please let pt know her FSH and estradiol levels were not in menopausal range.  However, she has just had a cycle after skipping several months.  Ok to start Estradiol 0.05mg  patches twice weekly.  Recheck 1 month.  I will need to add progesterone at that point so it is very important that she keep this appt.  Thanks.

## 2016-05-17 NOTE — Telephone Encounter (Signed)
Attempted to reach patient at number provided [phone], there was no answer and recording states that the voicemail box is full and is not accempting new messages.

## 2016-05-18 MED ORDER — ESTRADIOL 0.05 MG/24HR TD PTTW: 1.0000 | MEDICATED_PATCH | TRANSDERMAL | 1 refills | Status: DC

## 2016-05-18 NOTE — Telephone Encounter (Signed)
Spoke with patient. Advise of message and results as seen below from Dr.Miller. Patient verbalizes understanding. 4 week recheck scheduled for 06/23/2016 at 1:45 pm with Dr.Miller. Patient is agreeable to date and time. Rx for Estradiol .05 mg biweekly patches #8 1RF sent to pharmacy on file to get patient to her follow up appointment. Patient is agreeable.   Routing to provider for final review. Patient agreeable to disposition. Will close encounter.

## 2016-05-20 ENCOUNTER — Encounter (HOSPITAL_COMMUNITY): Payer: Self-pay | Admitting: Emergency Medicine

## 2016-05-20 ENCOUNTER — Inpatient Hospital Stay (HOSPITAL_COMMUNITY)
Admission: EM | Admit: 2016-05-20 | Discharge: 2016-05-26 | DRG: 358 | Disposition: A | Discharge status: Home / Self Care | Payer: BLUE CROSS/BLUE SHIELD | Attending: Surgery | Admitting: Surgery

## 2016-05-20 DIAGNOSIS — F411 Generalized anxiety disorder: Secondary | ICD-10-CM | POA: Diagnosis present

## 2016-05-20 DIAGNOSIS — Z882 Allergy status to sulfonamides status: Secondary | ICD-10-CM

## 2016-05-20 DIAGNOSIS — Z806 Family history of leukemia: Secondary | ICD-10-CM

## 2016-05-20 DIAGNOSIS — F319 Bipolar disorder, unspecified: Secondary | ICD-10-CM | POA: Diagnosis present

## 2016-05-20 DIAGNOSIS — Y998 Other external cause status: Secondary | ICD-10-CM

## 2016-05-20 DIAGNOSIS — G500 Trigeminal neuralgia: Secondary | ICD-10-CM | POA: Diagnosis present

## 2016-05-20 DIAGNOSIS — G35 Multiple sclerosis: Secondary | ICD-10-CM | POA: Diagnosis present

## 2016-05-20 DIAGNOSIS — J4530 Mild persistent asthma, uncomplicated: Secondary | ICD-10-CM | POA: Diagnosis present

## 2016-05-20 DIAGNOSIS — Z823 Family history of stroke: Secondary | ICD-10-CM

## 2016-05-20 DIAGNOSIS — Z79890 Hormone replacement therapy (postmenopausal): Secondary | ICD-10-CM

## 2016-05-20 DIAGNOSIS — Z801 Family history of malignant neoplasm of trachea, bronchus and lung: Secondary | ICD-10-CM

## 2016-05-20 DIAGNOSIS — Z91048 Other nonmedicinal substance allergy status: Secondary | ICD-10-CM

## 2016-05-20 DIAGNOSIS — S31610A Laceration without foreign body of abdominal wall, right upper quadrant with penetration into peritoneal cavity, initial encounter: Secondary | ICD-10-CM | POA: Diagnosis not present

## 2016-05-20 DIAGNOSIS — Z881 Allergy status to other antibiotic agents status: Secondary | ICD-10-CM

## 2016-05-20 DIAGNOSIS — Z825 Family history of asthma and other chronic lower respiratory diseases: Secondary | ICD-10-CM

## 2016-05-20 DIAGNOSIS — Y93G1 Activity, food preparation and clean up: Secondary | ICD-10-CM

## 2016-05-20 DIAGNOSIS — S36116A Major laceration of liver, initial encounter: Secondary | ICD-10-CM

## 2016-05-20 DIAGNOSIS — Y92010 Kitchen of single-family (private) house as the place of occurrence of the external cause: Secondary | ICD-10-CM

## 2016-05-20 DIAGNOSIS — K661 Hemoperitoneum: Secondary | ICD-10-CM

## 2016-05-20 DIAGNOSIS — S31119A Laceration without foreign body of abdominal wall, unspecified quadrant without penetration into peritoneal cavity, initial encounter: Secondary | ICD-10-CM | POA: Diagnosis present

## 2016-05-20 DIAGNOSIS — Z8249 Family history of ischemic heart disease and other diseases of the circulatory system: Secondary | ICD-10-CM

## 2016-05-20 DIAGNOSIS — S36899A Unspecified injury of other intra-abdominal organs, initial encounter: Secondary | ICD-10-CM | POA: Diagnosis present

## 2016-05-20 DIAGNOSIS — K5909 Other constipation: Secondary | ICD-10-CM | POA: Diagnosis present

## 2016-05-20 DIAGNOSIS — Z886 Allergy status to analgesic agent status: Secondary | ICD-10-CM

## 2016-05-20 DIAGNOSIS — F449 Dissociative and conversion disorder, unspecified: Secondary | ICD-10-CM | POA: Diagnosis present

## 2016-05-20 DIAGNOSIS — Z915 Personal history of self-harm: Secondary | ICD-10-CM

## 2016-05-20 DIAGNOSIS — Z8349 Family history of other endocrine, nutritional and metabolic diseases: Secondary | ICD-10-CM

## 2016-05-20 DIAGNOSIS — F4310 Post-traumatic stress disorder, unspecified: Secondary | ICD-10-CM | POA: Diagnosis present

## 2016-05-20 DIAGNOSIS — M797 Fibromyalgia: Secondary | ICD-10-CM | POA: Diagnosis present

## 2016-05-20 DIAGNOSIS — Z8261 Family history of arthritis: Secondary | ICD-10-CM

## 2016-05-20 DIAGNOSIS — W260XXA Contact with knife, initial encounter: Secondary | ICD-10-CM | POA: Diagnosis present

## 2016-05-20 DIAGNOSIS — Z79899 Other long term (current) drug therapy: Secondary | ICD-10-CM

## 2016-05-20 DIAGNOSIS — N3010 Interstitial cystitis (chronic) without hematuria: Secondary | ICD-10-CM | POA: Diagnosis present

## 2016-05-20 DIAGNOSIS — K589 Irritable bowel syndrome without diarrhea: Secondary | ICD-10-CM | POA: Diagnosis present

## 2016-05-20 DIAGNOSIS — R338 Other retention of urine: Secondary | ICD-10-CM | POA: Diagnosis present

## 2016-05-20 DIAGNOSIS — E781 Pure hyperglyceridemia: Secondary | ICD-10-CM | POA: Diagnosis present

## 2016-05-20 DIAGNOSIS — I959 Hypotension, unspecified: Secondary | ICD-10-CM | POA: Diagnosis present

## 2016-05-20 MED ORDER — ONDANSETRON HCL 4 MG/2ML IJ SOLN
4.0000 mg | Freq: Once | INTRAMUSCULAR | Status: AC
  Administered 2016-05-21: 4 mg via INTRAVENOUS
  Filled 2016-05-20: qty 2

## 2016-05-20 MED ORDER — SODIUM CHLORIDE 0.9 % IV BOLUS (SEPSIS)
500.0000 mL | Freq: Once | INTRAVENOUS | Status: AC
  Administered 2016-05-21: 500 mL via INTRAVENOUS

## 2016-05-20 MED ORDER — HYDROMORPHONE HCL 1 MG/ML IJ SOLN
1.0000 mg | Freq: Once | INTRAMUSCULAR | Status: AC
  Administered 2016-05-21: 1 mg via INTRAVENOUS
  Filled 2016-05-20: qty 1
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
## 2016-05-21 ENCOUNTER — Encounter (HOSPITAL_COMMUNITY): Payer: Self-pay | Admitting: Radiology

## 2016-05-21 ENCOUNTER — Emergency Department (HOSPITAL_COMMUNITY): Payer: BLUE CROSS/BLUE SHIELD

## 2016-05-21 ENCOUNTER — Emergency Department (HOSPITAL_COMMUNITY): Payer: BLUE CROSS/BLUE SHIELD | Admitting: Certified Registered Nurse Anesthetist

## 2016-05-21 ENCOUNTER — Encounter (HOSPITAL_COMMUNITY): Admission: EM | Disposition: A | Discharge status: Home / Self Care | Payer: Self-pay | Source: Home / Self Care

## 2016-05-21 DIAGNOSIS — W260XXA Contact with knife, initial encounter: Secondary | ICD-10-CM | POA: Diagnosis present

## 2016-05-21 DIAGNOSIS — J4530 Mild persistent asthma, uncomplicated: Secondary | ICD-10-CM | POA: Diagnosis present

## 2016-05-21 DIAGNOSIS — G35 Multiple sclerosis: Secondary | ICD-10-CM | POA: Diagnosis present

## 2016-05-21 DIAGNOSIS — Z886 Allergy status to analgesic agent status: Secondary | ICD-10-CM | POA: Diagnosis not present

## 2016-05-21 DIAGNOSIS — Z91048 Other nonmedicinal substance allergy status: Secondary | ICD-10-CM | POA: Diagnosis not present

## 2016-05-21 DIAGNOSIS — Z8349 Family history of other endocrine, nutritional and metabolic diseases: Secondary | ICD-10-CM | POA: Diagnosis not present

## 2016-05-21 DIAGNOSIS — Z881 Allergy status to other antibiotic agents status: Secondary | ICD-10-CM | POA: Diagnosis not present

## 2016-05-21 DIAGNOSIS — S31119A Laceration without foreign body of abdominal wall, unspecified quadrant without penetration into peritoneal cavity, initial encounter: Secondary | ICD-10-CM | POA: Diagnosis present

## 2016-05-21 DIAGNOSIS — Y92010 Kitchen of single-family (private) house as the place of occurrence of the external cause: Secondary | ICD-10-CM | POA: Diagnosis not present

## 2016-05-21 DIAGNOSIS — S36899A Unspecified injury of other intra-abdominal organs, initial encounter: Secondary | ICD-10-CM | POA: Diagnosis present

## 2016-05-21 DIAGNOSIS — Z8249 Family history of ischemic heart disease and other diseases of the circulatory system: Secondary | ICD-10-CM | POA: Diagnosis not present

## 2016-05-21 DIAGNOSIS — K5909 Other constipation: Secondary | ICD-10-CM | POA: Diagnosis present

## 2016-05-21 DIAGNOSIS — S31610A Laceration without foreign body of abdominal wall, right upper quadrant with penetration into peritoneal cavity, initial encounter: Secondary | ICD-10-CM | POA: Diagnosis present

## 2016-05-21 DIAGNOSIS — Z823 Family history of stroke: Secondary | ICD-10-CM | POA: Diagnosis not present

## 2016-05-21 DIAGNOSIS — Y998 Other external cause status: Secondary | ICD-10-CM | POA: Diagnosis not present

## 2016-05-21 DIAGNOSIS — R338 Other retention of urine: Secondary | ICD-10-CM | POA: Diagnosis present

## 2016-05-21 DIAGNOSIS — Y93G1 Activity, food preparation and clean up: Secondary | ICD-10-CM | POA: Diagnosis not present

## 2016-05-21 DIAGNOSIS — Z8261 Family history of arthritis: Secondary | ICD-10-CM | POA: Diagnosis not present

## 2016-05-21 DIAGNOSIS — N3010 Interstitial cystitis (chronic) without hematuria: Secondary | ICD-10-CM | POA: Diagnosis present

## 2016-05-21 DIAGNOSIS — K589 Irritable bowel syndrome without diarrhea: Secondary | ICD-10-CM | POA: Diagnosis present

## 2016-05-21 DIAGNOSIS — Z806 Family history of leukemia: Secondary | ICD-10-CM | POA: Diagnosis not present

## 2016-05-21 DIAGNOSIS — I959 Hypotension, unspecified: Secondary | ICD-10-CM | POA: Diagnosis present

## 2016-05-21 DIAGNOSIS — Z801 Family history of malignant neoplasm of trachea, bronchus and lung: Secondary | ICD-10-CM | POA: Diagnosis not present

## 2016-05-21 DIAGNOSIS — Z825 Family history of asthma and other chronic lower respiratory diseases: Secondary | ICD-10-CM | POA: Diagnosis not present

## 2016-05-21 DIAGNOSIS — G500 Trigeminal neuralgia: Secondary | ICD-10-CM | POA: Diagnosis present

## 2016-05-21 DIAGNOSIS — F319 Bipolar disorder, unspecified: Secondary | ICD-10-CM | POA: Diagnosis present

## 2016-05-21 DIAGNOSIS — S36116A Major laceration of liver, initial encounter: Secondary | ICD-10-CM | POA: Diagnosis present

## 2016-05-21 DIAGNOSIS — Z882 Allergy status to sulfonamides status: Secondary | ICD-10-CM | POA: Diagnosis not present

## 2016-05-21 DIAGNOSIS — Z915 Personal history of self-harm: Secondary | ICD-10-CM | POA: Diagnosis not present

## 2016-05-21 DIAGNOSIS — E781 Pure hyperglyceridemia: Secondary | ICD-10-CM | POA: Diagnosis present

## 2016-05-21 HISTORY — PX: LAPAROTOMY: SHX154

## 2016-05-21 LAB — CBC
HEMATOCRIT: 32.2 % — AB (ref 36.0–46.0)
HEMATOCRIT: 38.9 % (ref 36.0–46.0)
HEMOGLOBIN: 11.1 g/dL — AB (ref 12.0–15.0)
Hemoglobin: 13.3 g/dL (ref 12.0–15.0)
MCH: 31.1 pg (ref 26.0–34.0)
MCH: 31.7 pg (ref 26.0–34.0)
MCHC: 34.2 g/dL (ref 30.0–36.0)
MCHC: 34.5 g/dL (ref 30.0–36.0)
MCV: 90.9 fL (ref 78.0–100.0)
MCV: 92 fL (ref 78.0–100.0)
PLATELETS: 222 10*3/uL (ref 150–400)
Platelets: 188 10*3/uL (ref 150–400)
RBC: 3.5 MIL/uL — AB (ref 3.87–5.11)
RBC: 4.28 MIL/uL (ref 3.87–5.11)
RDW: 12.5 % (ref 11.5–15.5)
RDW: 12.6 % (ref 11.5–15.5)
WBC: 13.4 10*3/uL — ABNORMAL HIGH (ref 4.0–10.5)
WBC: 7.7 10*3/uL (ref 4.0–10.5)

## 2016-05-21 LAB — COMPREHENSIVE METABOLIC PANEL
ALBUMIN: 4.2 g/dL (ref 3.5–5.0)
ALT: 13 U/L — ABNORMAL LOW (ref 14–54)
ANION GAP: 11 (ref 5–15)
AST: 19 U/L (ref 15–41)
Alkaline Phosphatase: 47 U/L (ref 38–126)
BILIRUBIN TOTAL: 0.4 mg/dL (ref 0.3–1.2)
BUN: 11 mg/dL (ref 6–20)
CO2: 24 mmol/L (ref 22–32)
Calcium: 9 mg/dL (ref 8.9–10.3)
Chloride: 103 mmol/L (ref 101–111)
Creatinine, Ser: 0.76 mg/dL (ref 0.44–1.00)
GFR calc Af Amer: >60 mL/min (ref >60)
GLUCOSE: 111 mg/dL — AB (ref 65–99)
POTASSIUM: 3.9 mmol/L (ref 3.5–5.1)
Sodium: 138 mmol/L (ref 135–145)
TOTAL PROTEIN: 6.7 g/dL (ref 6.5–8.1)

## 2016-05-21 LAB — TYPE AND SCREEN
ABO/RH(D): A NEG
Antibody Screen: NEGATIVE

## 2016-05-21 LAB — PROTIME-INR
INR: 0.99
PROTHROMBIN TIME: 13.1 s (ref 11.4–15.2)

## 2016-05-21 LAB — MRSA PCR SCREENING: MRSA BY PCR: NEGATIVE

## 2016-05-21 LAB — ABO/RH: ABO/RH(D): A NEG

## 2016-05-21 LAB — LIPASE, BLOOD: Lipase: 21 U/L (ref 11–51)

## 2016-05-21 SURGERY — LAPAROTOMY, EXPLORATORY
Anesthesia: General

## 2016-05-21 MED ORDER — DEXTROSE 5 % IV SOLN
1000.0000 mg | Freq: Three times a day (TID) | INTRAVENOUS | Status: DC | PRN
  Administered 2016-05-21 – 2016-05-22 (×3): 1000 mg via INTRAVENOUS
  Filled 2016-05-21 (×6): qty 10

## 2016-05-21 MED ORDER — ALBUTEROL SULFATE (2.5 MG/3ML) 0.083% IN NEBU: 2.5000 mg | INHALATION_SOLUTION | RESPIRATORY_TRACT | Status: DC | PRN

## 2016-05-21 MED ORDER — GABAPENTIN 300 MG PO CAPS
300.0000 mg | ORAL_CAPSULE | Freq: Three times a day (TID) | ORAL | Status: DC
  Administered 2016-05-21 – 2016-05-26 (×16): 300 mg via ORAL
  Filled 2016-05-21 (×16): qty 1

## 2016-05-21 MED ORDER — SODIUM CHLORIDE 0.9 % IR SOLN
Status: DC | PRN
  Administered 2016-05-21: 4000 mL

## 2016-05-21 MED ORDER — PHENYLEPHRINE 40 MCG/ML (10ML) SYRINGE FOR IV PUSH (FOR BLOOD PRESSURE SUPPORT)
PREFILLED_SYRINGE | INTRAVENOUS | Status: DC | PRN
  Administered 2016-05-21 (×2): 80 ug via INTRAVENOUS

## 2016-05-21 MED ORDER — SUGAMMADEX SODIUM 200 MG/2ML IV SOLN
INTRAVENOUS | Status: DC | PRN
  Administered 2016-05-21: 120 mg via INTRAVENOUS

## 2016-05-21 MED ORDER — LACTATED RINGERS IV SOLN: INTRAVENOUS | Status: DC

## 2016-05-21 MED ORDER — IOPAMIDOL (ISOVUE-300) INJECTION 61%
INTRAVENOUS | Status: AC
  Filled 2016-05-21: qty 100

## 2016-05-21 MED ORDER — ALBUTEROL SULFATE (2.5 MG/3ML) 0.083% IN NEBU: 3.0000 mL | INHALATION_SOLUTION | Freq: Four times a day (QID) | RESPIRATORY_TRACT | Status: DC | PRN

## 2016-05-21 MED ORDER — FENTANYL CITRATE (PF) 100 MCG/2ML IJ SOLN
25.0000 ug | INTRAMUSCULAR | Status: DC | PRN
  Administered 2016-05-21: 25 ug via INTRAVENOUS
  Administered 2016-05-21: 50 ug via INTRAVENOUS
  Administered 2016-05-21: 25 ug via INTRAVENOUS

## 2016-05-21 MED ORDER — ACETAMINOPHEN 325 MG PO TABS
650.0000 mg | ORAL_TABLET | Freq: Four times a day (QID) | ORAL | Status: DC
  Administered 2016-05-21 – 2016-05-23 (×7): 650 mg via ORAL
  Filled 2016-05-21 (×8): qty 2

## 2016-05-21 MED ORDER — ROCURONIUM BROMIDE 10 MG/ML (PF) SYRINGE
PREFILLED_SYRINGE | INTRAVENOUS | Status: DC | PRN
Start: 2016-05-21 — End: 2016-05-21
  Administered 2016-05-21: 30 mg via INTRAVENOUS

## 2016-05-21 MED ORDER — SUGAMMADEX SODIUM 200 MG/2ML IV SOLN
INTRAVENOUS | Status: AC
  Filled 2016-05-21: qty 2

## 2016-05-21 MED ORDER — DEXAMETHASONE SODIUM PHOSPHATE 10 MG/ML IJ SOLN
INTRAMUSCULAR | Status: DC | PRN
  Administered 2016-05-21: 10 mg via INTRAVENOUS

## 2016-05-21 MED ORDER — HYDROMORPHONE HCL 1 MG/ML IJ SOLN
0.5000 mg | INTRAMUSCULAR | Status: DC | PRN
  Administered 2016-05-21 – 2016-05-22 (×7): 1 mg via INTRAVENOUS
  Filled 2016-05-21 (×7): qty 1

## 2016-05-21 MED ORDER — PANTOPRAZOLE SODIUM 40 MG PO TBEC
40.0000 mg | DELAYED_RELEASE_TABLET | Freq: Every day | ORAL | Status: DC
  Administered 2016-05-21 – 2016-05-26 (×6): 40 mg via ORAL
  Filled 2016-05-21 (×6): qty 1

## 2016-05-21 MED ORDER — MEPERIDINE HCL 25 MG/ML IJ SOLN: 6.2500 mg | INTRAMUSCULAR | Status: DC | PRN

## 2016-05-21 MED ORDER — SODIUM CHLORIDE 0.9 % IV BOLUS (SEPSIS)
1000.0000 mL | Freq: Once | INTRAVENOUS | Status: AC
  Administered 2016-05-21: 1000 mL via INTRAVENOUS

## 2016-05-21 MED ORDER — ONDANSETRON HCL 4 MG/2ML IJ SOLN
INTRAMUSCULAR | Status: AC
  Filled 2016-05-21: qty 2

## 2016-05-21 MED ORDER — DEXAMETHASONE SODIUM PHOSPHATE 10 MG/ML IJ SOLN
INTRAMUSCULAR | Status: AC
  Filled 2016-05-21: qty 1

## 2016-05-21 MED ORDER — PROPOFOL 10 MG/ML IV BOLUS
INTRAVENOUS | Status: AC
  Filled 2016-05-21: qty 20

## 2016-05-21 MED ORDER — HYDROXYZINE HCL 25 MG PO TABS
50.0000 mg | ORAL_TABLET | Freq: Every day | ORAL | Status: DC
  Administered 2016-05-21 – 2016-05-25 (×5): 50 mg via ORAL
  Filled 2016-05-21 (×5): qty 2

## 2016-05-21 MED ORDER — ONDANSETRON HCL 4 MG/2ML IJ SOLN
INTRAMUSCULAR | Status: DC | PRN
  Administered 2016-05-21: 4 mg via INTRAVENOUS

## 2016-05-21 MED ORDER — ONDANSETRON HCL 4 MG/2ML IJ SOLN
4.0000 mg | Freq: Four times a day (QID) | INTRAMUSCULAR | Status: DC | PRN
  Administered 2016-05-25: 4 mg via INTRAVENOUS
  Filled 2016-05-21: qty 2

## 2016-05-21 MED ORDER — CLONAZEPAM 1 MG PO TABS
1.0000 mg | ORAL_TABLET | Freq: Two times a day (BID) | ORAL | Status: DC | PRN
  Administered 2016-05-21 – 2016-05-25 (×5): 1 mg via ORAL
  Filled 2016-05-21 (×5): qty 1

## 2016-05-21 MED ORDER — HYDROXYZINE HCL 25 MG PO TABS: 1250.0000 mg | ORAL_TABLET | Freq: Every day | ORAL | Status: DC

## 2016-05-21 MED ORDER — LEVALBUTEROL TARTRATE 45 MCG/ACT IN AERO: 1.0000 | INHALATION_SPRAY | RESPIRATORY_TRACT | Status: DC | PRN

## 2016-05-21 MED ORDER — TRAZODONE HCL 100 MG PO TABS
200.0000 mg | ORAL_TABLET | Freq: Every day | ORAL | Status: DC
  Administered 2016-05-21 – 2016-05-25 (×5): 200 mg via ORAL
  Filled 2016-05-21: qty 2
  Filled 2016-05-21: qty 4
  Filled 2016-05-21 (×3): qty 2

## 2016-05-21 MED ORDER — FENTANYL CITRATE (PF) 100 MCG/2ML IJ SOLN
INTRAMUSCULAR | Status: AC
  Filled 2016-05-21: qty 2

## 2016-05-21 MED ORDER — LACTATED RINGERS IV SOLN
INTRAVENOUS | Status: DC | PRN
  Administered 2016-05-21 (×2): via INTRAVENOUS

## 2016-05-21 MED ORDER — BUPROPION HCL ER (XL) 300 MG PO TB24
300.0000 mg | ORAL_TABLET | Freq: Every day | ORAL | Status: DC
  Administered 2016-05-21 – 2016-05-26 (×6): 300 mg via ORAL
  Filled 2016-05-21 (×6): qty 1

## 2016-05-21 MED ORDER — MIDAZOLAM HCL 5 MG/5ML IJ SOLN
INTRAMUSCULAR | Status: DC | PRN
  Administered 2016-05-21: 2 mg via INTRAVENOUS

## 2016-05-21 MED ORDER — LIDOCAINE 2% (20 MG/ML) 5 ML SYRINGE
INTRAMUSCULAR | Status: AC
  Filled 2016-05-21: qty 5

## 2016-05-21 MED ORDER — PROPOFOL 10 MG/ML IV BOLUS
INTRAVENOUS | Status: DC | PRN
  Administered 2016-05-21: 120 mg via INTRAVENOUS

## 2016-05-21 MED ORDER — FENTANYL CITRATE (PF) 250 MCG/5ML IJ SOLN
INTRAMUSCULAR | Status: AC
  Filled 2016-05-21: qty 5

## 2016-05-21 MED ORDER — IOPAMIDOL (ISOVUE-300) INJECTION 61%
100.0000 mL | Freq: Once | INTRAVENOUS | Status: AC | PRN
  Administered 2016-05-21: 100 mL via INTRAVENOUS

## 2016-05-21 MED ORDER — SUCCINYLCHOLINE CHLORIDE 200 MG/10ML IV SOSY
PREFILLED_SYRINGE | INTRAVENOUS | Status: DC | PRN
  Administered 2016-05-21: 120 mg via INTRAVENOUS

## 2016-05-21 MED ORDER — PANTOPRAZOLE SODIUM 40 MG IV SOLR: 40.0000 mg | Freq: Every day | INTRAVENOUS | Status: DC

## 2016-05-21 MED ORDER — FENTANYL CITRATE (PF) 100 MCG/2ML IJ SOLN
INTRAMUSCULAR | Status: DC | PRN
  Administered 2016-05-21 (×4): 50 ug via INTRAVENOUS

## 2016-05-21 MED ORDER — LAMOTRIGINE 100 MG PO TABS
300.0000 mg | ORAL_TABLET | Freq: Every day | ORAL | Status: DC
  Administered 2016-05-21 – 2016-05-26 (×6): 300 mg via ORAL
  Filled 2016-05-21 (×6): qty 3

## 2016-05-21 MED ORDER — OXYCODONE HCL 5 MG PO TABS
5.0000 mg | ORAL_TABLET | ORAL | Status: DC | PRN
  Administered 2016-05-21 – 2016-05-23 (×8): 5 mg via ORAL
  Filled 2016-05-21 (×8): qty 1

## 2016-05-21 MED ORDER — LIDOCAINE 2% (20 MG/ML) 5 ML SYRINGE
INTRAMUSCULAR | Status: DC | PRN
  Administered 2016-05-21: 60 mg via INTRAVENOUS

## 2016-05-21 MED ORDER — KCL IN DEXTROSE-NACL 20-5-0.45 MEQ/L-%-% IV SOLN
INTRAVENOUS | Status: DC
  Administered 2016-05-21 – 2016-05-22 (×3): via INTRAVENOUS
  Filled 2016-05-21 (×3): qty 1000

## 2016-05-21 MED ORDER — SODIUM CHLORIDE 0.9 % IV SOLN
INTRAVENOUS | Status: DC | PRN
  Administered 2016-05-21: 04:00:00 via INTRAVENOUS

## 2016-05-21 MED ORDER — METOCLOPRAMIDE HCL 5 MG/ML IJ SOLN: 10.0000 mg | Freq: Once | INTRAMUSCULAR | Status: DC | PRN

## 2016-05-21 MED ORDER — MIDAZOLAM HCL 2 MG/2ML IJ SOLN
INTRAMUSCULAR | Status: AC
  Filled 2016-05-21: qty 2

## 2016-05-21 MED ORDER — HYDROMORPHONE HCL 1 MG/ML IJ SOLN
1.0000 mg | INTRAMUSCULAR | Status: DC | PRN
  Administered 2016-05-21: 1 mg via INTRAVENOUS
  Filled 2016-05-21: qty 1

## 2016-05-21 MED ORDER — ONDANSETRON 4 MG PO TBDP: 4.0000 mg | ORAL_TABLET | Freq: Four times a day (QID) | ORAL | Status: DC | PRN

## 2016-05-21 SURGICAL SUPPLY — 40 items
APPLICATOR COTTON TIP 6IN STRL (MISCELLANEOUS) ×2 IMPLANT
BLADE EXTENDED COATED 6.5IN (ELECTRODE) ×1 IMPLANT
BLADE HEX COATED 2.75 (ELECTRODE) ×2 IMPLANT
CHLORAPREP W/TINT 26ML (MISCELLANEOUS) ×2 IMPLANT
COVER MAYO STAND STRL (DRAPES) ×1 IMPLANT
COVER SURGICAL LIGHT HANDLE (MISCELLANEOUS) ×2 IMPLANT
DRAPE LAPAROSCOPIC ABDOMINAL (DRAPES) ×2 IMPLANT
DRAPE WARM FLUID 44X44 (DRAPE) ×1 IMPLANT
DRSG PAD ABDOMINAL 8X10 ST (GAUZE/BANDAGES/DRESSINGS) ×1 IMPLANT
ELECT REM PT RETURN 9FT ADLT (ELECTROSURGICAL) ×2
ELECTRODE REM PT RTRN 9FT ADLT (ELECTROSURGICAL) ×1 IMPLANT
GAUZE SPONGE 4X4 12PLY STRL (GAUZE/BANDAGES/DRESSINGS) ×2 IMPLANT
GLOVE BIOGEL PI IND STRL 7.0 (GLOVE) ×1 IMPLANT
GLOVE BIOGEL PI INDICATOR 7.0 (GLOVE) ×1
GLOVE SURG ORTHO 8.0 STRL STRW (GLOVE) ×2 IMPLANT
GOWN STRL REUS W/TWL LRG LVL3 (GOWN DISPOSABLE) ×2 IMPLANT
GOWN STRL REUS W/TWL XL LVL3 (GOWN DISPOSABLE) ×4 IMPLANT
HANDLE SUCTION POOLE (INSTRUMENTS) IMPLANT
KIT BASIN OR (CUSTOM PROCEDURE TRAY) ×2 IMPLANT
NS IRRIG 1000ML POUR BTL (IV SOLUTION) ×1 IMPLANT
PACK GENERAL/GYN (CUSTOM PROCEDURE TRAY) ×2 IMPLANT
SPONGE LAP 18X18 X RAY DECT (DISPOSABLE) ×2 IMPLANT
STAPLER VISISTAT 35W (STAPLE) ×2 IMPLANT
SUCTION POOLE HANDLE (INSTRUMENTS)
SUT NOV 1 T60/GS (SUTURE) IMPLANT
SUT PDS AB 1 TP1 96 (SUTURE) ×2 IMPLANT
SUT SILK 2 0 (SUTURE) ×2
SUT SILK 2 0 SH CR/8 (SUTURE) ×1 IMPLANT
SUT SILK 2-0 18XBRD TIE 12 (SUTURE) IMPLANT
SUT SILK 3 0 (SUTURE)
SUT SILK 3 0 SH CR/8 (SUTURE) ×1 IMPLANT
SUT SILK 3-0 18XBRD TIE 12 (SUTURE) IMPLANT
SUT VICRYL 2 0 18  UND BR (SUTURE)
SUT VICRYL 2 0 18 UND BR (SUTURE) IMPLANT
TAPE CLOTH SURG 6X10 WHT LF (GAUZE/BANDAGES/DRESSINGS) ×1 IMPLANT
TOWEL OR 17X26 10 PK STRL BLUE (TOWEL DISPOSABLE) ×4 IMPLANT
TRAY FOLEY BAG SILVER LF 14FR (CATHETERS) ×1 IMPLANT
TRAY FOLEY W/METER SILVER 16FR (SET/KITS/TRAYS/PACK) IMPLANT
WATER STERILE IRR 1500ML POUR (IV SOLUTION) ×1 IMPLANT
YANKAUER SUCT BULB TIP NO VENT (SUCTIONS) IMPLANT
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
## 2016-05-21 NOTE — ED Notes (Signed)
Pt. Laceration:suture cart to bedside documented in error.

## 2016-05-21 NOTE — Anesthesia Procedure Notes (Signed)
Procedure Name: Intubation Date/Time: 05/21/2016 4:19 AM Performed by: Montel Clock Pre-anesthesia Checklist: Patient identified, Emergency Drugs available, Suction available, Patient being monitored and Timeout performed Patient Re-evaluated:Patient Re-evaluated prior to inductionOxygen Delivery Method: Circle system utilized Preoxygenation: Pre-oxygenation with 100% oxygen Intubation Type: IV induction, Cricoid Pressure applied and Rapid sequence Laryngoscope Size: Mac and 3 Grade View: Grade II Tube type: Subglottic suction tube Tube size: 7.5 mm Number of attempts: 1 Airway Equipment and Method: Stylet Placement Confirmation: ETT inserted through vocal cords under direct vision,  positive ETCO2 and breath sounds checked- equal and bilateral Secured at: 21 cm Tube secured with: Tape Dental Injury: Teeth and Oropharynx as per pre-operative assessment  Comments: Very bottom of cords visible with cricoid pressure.

## 2016-05-21 NOTE — Brief Op Note (Signed)
05/20/2016 - 05/21/2016  5:10 AM  PATIENT:  Erika Mccoy  51 y.o. female  PRE-OPERATIVE DIAGNOSIS:  Abdominal Wound  POST-OPERATIVE DIAGNOSIS:  Abdominal Wound  PROCEDURE:  Procedure(s): EXPLORATORY LAPAROTOMY, CAUTERIZATION OF LIVER LACERATION, EVACUATION OF HEMOPERITONEUM (N/A)  SURGEON:  Surgeon(s) and Role:    * Darnell Level, MD - Primary  ANESTHESIA:   general  EBL:  Total I/O In: 1000 [I.V.:1000] Out: 1100 [Urine:750; Blood:350]  BLOOD ADMINISTERED:none  DRAINS: none   LOCAL MEDICATIONS USED:  NONE  SPECIMEN:  No Specimen  DISPOSITION OF SPECIMEN:  N/A  COUNTS:  YES  TOURNIQUET:  * No tourniquets in log *  DICTATION: .Other Dictation: Dictation Number [id number]  PLAN OF CARE: Other Dictation: Dictation Number [id number]  PATIENT DISPOSITION:  PACU - hemodynamically stable.   Delay start of Pharmacological VTE agent (>24hrs) due to surgical blood loss or risk of bleeding: yes  Velora Heckler, MD, [facility] Office: [phone]

## 2016-05-21 NOTE — Anesthesia Preprocedure Evaluation (Signed)
Anesthesia Evaluation  Patient identified by MRN, date of birth, ID band Patient awake    Reviewed: Allergy & Precautions, NPO status , Patient's Chart, lab work & pertinent test results  Airway Mallampati: II  TM Distance: >3 FB Neck ROM: Full    Dental no notable dental hx.  Full veneers:   Pulmonary neg pulmonary ROS,    Pulmonary exam normal breath sounds clear to auscultation       Cardiovascular negative cardio ROS Normal cardiovascular exam Rhythm:Regular Rate:Normal     Neuro/Psych Bipolar Disorder MS negative neurological ROS     GI/Hepatic negative GI ROS, Neg liver ROS,   Endo/Other  negative endocrine ROS  Renal/GU Interstitial cystitis  negative genitourinary   Musculoskeletal  (+) Fibromyalgia -  Abdominal   Peds negative pediatric ROS (+)  Hematology negative hematology ROS (+)   Anesthesia Other Findings   Reproductive/Obstetrics negative OB ROS                             Anesthesia Physical Anesthesia Plan  ASA: II  Anesthesia Plan: General   Post-op Pain Management:    Induction: Intravenous, Rapid sequence and Cricoid pressure planned  Airway Management Planned: Oral ETT  Additional Equipment:   Intra-op Plan:   Post-operative Plan: Extubation in OR  Informed Consent: I have reviewed the patients History and Physical, chart, labs and discussed the procedure including the risks, benefits and alternatives for the proposed anesthesia with the patient or authorized representative who has indicated his/her understanding and acceptance.   Dental advisory given  Plan Discussed with: CRNA  Anesthesia Plan Comments:         Anesthesia Quick Evaluation

## 2016-05-21 NOTE — ED Notes (Signed)
Pt to OR.

## 2016-05-21 NOTE — Transfer of Care (Signed)
Immediate Anesthesia Transfer of Care Note  Patient: Erika Mccoy  Procedure(s) Performed: Procedure(s): EXPLORATORY LAPAROTOMY, CAUTERIZATION OF LIVER LACERATION, EVACUATION OF HEMOPERITONEUM (N/A)  Patient Location: PACU  Anesthesia Type:General  Level of Consciousness:  sedated, patient cooperative and responds to stimulation  Airway & Oxygen Therapy:Patient Spontanous Breathing and Patient connected to face mask oxgen  Post-op Assessment:  Report given to PACU RN and Post -op Vital signs reviewed and stable  Post vital signs:  Reviewed and stable  Last Vitals:  Vitals:   05/21/16 0330 05/21/16 0526  BP: 94/63   Pulse: 110 (!) 108  Resp: 17   Temp:      Complications: No apparent anesthesia complications
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
## 2016-05-22 LAB — CBC
HCT: 28.7 % — ABNORMAL LOW (ref 36.0–46.0)
HEMOGLOBIN: 10 g/dL — AB (ref 12.0–15.0)
MCH: 32.5 pg (ref 26.0–34.0)
MCHC: 34.8 g/dL (ref 30.0–36.0)
MCV: 93.2 fL (ref 78.0–100.0)
PLATELETS: 165 10*3/uL (ref 150–400)
RBC: 3.08 MIL/uL — AB (ref 3.87–5.11)
RDW: 12.8 % (ref 11.5–15.5)
WBC: 9.3 10*3/uL (ref 4.0–10.5)

## 2016-05-22 LAB — BASIC METABOLIC PANEL
ANION GAP: 6 (ref 5–15)
BUN: <5 mg/dL — ABNORMAL LOW (ref 6–20)
CALCIUM: 7.3 mg/dL — AB (ref 8.9–10.3)
CO2: 26 mmol/L (ref 22–32)
CREATININE: 0.49 mg/dL (ref 0.44–1.00)
Chloride: 104 mmol/L (ref 101–111)
Glucose, Bld: 114 mg/dL — ABNORMAL HIGH (ref 65–99)
Potassium: 3.9 mmol/L (ref 3.5–5.1)
SODIUM: 136 mmol/L (ref 135–145)

## 2016-05-22 MED ORDER — HYDROMORPHONE HCL 2 MG/ML IJ SOLN
0.5000 mg | INTRAMUSCULAR | Status: DC | PRN
  Administered 2016-05-22 – 2016-05-24 (×6): 0.5 mg via INTRAVENOUS
  Filled 2016-05-22 (×5): qty 1

## 2016-05-22 MED ORDER — HYDROMORPHONE HCL 1 MG/ML IJ SOLN
0.5000 mg | INTRAMUSCULAR | Status: DC | PRN
  Administered 2016-05-22: 0.5 mg via INTRAVENOUS
  Filled 2016-05-22 (×2): qty 0.5

## 2016-05-22 MED ORDER — BIOTENE DRY MOUTH MT LIQD: 15.0000 mL | OROMUCOSAL | Status: DC | PRN

## 2016-05-22 MED ORDER — LINACLOTIDE 290 MCG PO CAPS
290.0000 ug | ORAL_CAPSULE | Freq: Every day | ORAL | Status: DC
  Administered 2016-05-22 – 2016-05-26 (×5): 290 ug via ORAL
  Filled 2016-05-22 (×6): qty 1

## 2016-05-22 MED ORDER — METHOCARBAMOL 500 MG PO TABS
750.0000 mg | ORAL_TABLET | Freq: Four times a day (QID) | ORAL | Status: DC | PRN
  Administered 2016-05-22 – 2016-05-25 (×9): 750 mg via ORAL
  Filled 2016-05-22 (×10): qty 2
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
## 2016-05-22 NOTE — Anesthesia Postprocedure Evaluation (Signed)
Anesthesia Post Note  Patient: Erika Mccoy  Procedure(s) Performed: Procedure(s) (LRB): EXPLORATORY LAPAROTOMY, CAUTERIZATION OF LIVER LACERATION, EVACUATION OF HEMOPERITONEUM (N/A)  Patient location during evaluation: PACU Anesthesia Type: General Level of consciousness: awake and alert Pain management: pain level controlled Vital Signs Assessment: post-procedure vital signs reviewed and stable Respiratory status: spontaneous breathing, nonlabored ventilation, respiratory function stable and patient connected to nasal cannula oxygen Cardiovascular status: blood pressure returned to baseline and stable Postop Assessment: no signs of nausea or vomiting Anesthetic complications: no       Last Vitals:  Vitals:   05/22/16 0507 05/22/16 0605  BP: (!) 89/61 (!) 89/52  Pulse: (!) 112 (!) 113  Resp: (!) 32 19  Temp:      Last Pain:  Vitals:   05/22/16 0434  TempSrc:   PainSc: [place]

## 2016-05-23 ENCOUNTER — Encounter (HOSPITAL_COMMUNITY): Payer: Self-pay | Admitting: Surgery

## 2016-05-23 DIAGNOSIS — S36116A Major laceration of liver, initial encounter: Secondary | ICD-10-CM

## 2016-05-23 LAB — HEMOGLOBIN AND HEMATOCRIT, BLOOD
HCT: 30.7 % — ABNORMAL LOW (ref 36.0–46.0)
Hemoglobin: 10.1 g/dL — ABNORMAL LOW (ref 12.0–15.0)

## 2016-05-23 MED ORDER — OXYCODONE HCL 5 MG PO TABS
5.0000 mg | ORAL_TABLET | ORAL | Status: DC | PRN
  Administered 2016-05-23 – 2016-05-26 (×10): 15 mg via ORAL
  Filled 2016-05-23 (×10): qty 3

## 2016-05-23 MED ORDER — POLYSACCHARIDE IRON COMPLEX 150 MG PO CAPS
150.0000 mg | ORAL_CAPSULE | Freq: Every day | ORAL | Status: DC
  Administered 2016-05-24 – 2016-05-26 (×3): 150 mg via ORAL
  Filled 2016-05-23 (×4): qty 1

## 2016-05-23 MED ORDER — CHOLECALCIFEROL 25 MCG (1000 UT) PO CAPS: 5000.0000 [IU] | ORAL_CAPSULE | Freq: Every day | ORAL | Status: DC

## 2016-05-23 MED ORDER — VITAMIN D 1000 UNITS PO TABS
5000.0000 [IU] | ORAL_TABLET | Freq: Every day | ORAL | Status: DC
  Administered 2016-05-24 – 2016-05-26 (×3): 5000 [IU] via ORAL
  Filled 2016-05-23 (×2): qty 5

## 2016-05-23 MED ORDER — ACETAMINOPHEN 500 MG PO TABS
1000.0000 mg | ORAL_TABLET | Freq: Three times a day (TID) | ORAL | Status: DC
  Administered 2016-05-23 – 2016-05-26 (×10): 1000 mg via ORAL
  Filled 2016-05-23 (×10): qty 2

## 2016-05-23 MED ORDER — PRENATAL MULTIVITAMIN CH
1.0000 | ORAL_TABLET | Freq: Every day | ORAL | Status: DC
  Administered 2016-05-24 – 2016-05-25 (×2): 1 via ORAL
  Filled 2016-05-23 (×3): qty 1

## 2016-05-23 MED ORDER — LINACLOTIDE 290 MCG PO CAPS: 290.0000 ug | ORAL_CAPSULE | ORAL | Status: DC
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
## 2016-05-23 NOTE — Progress Notes (Signed)
Patient ambulating around room.  Conversations about her procedure.  No complaints of pain at this time.

## 2016-05-24 MED ORDER — HYDROMORPHONE HCL 2 MG/ML IJ SOLN
0.5000 mg | INTRAMUSCULAR | Status: DC | PRN
  Administered 2016-05-24 – 2016-05-25 (×3): 0.5 mg via INTRAVENOUS
  Filled 2016-05-24 (×3): qty 1

## 2016-05-24 MED ORDER — BISACODYL 10 MG RE SUPP
10.0000 mg | Freq: Every day | RECTAL | Status: DC | PRN
Start: 2016-05-24 — End: 2016-05-26
  Administered 2016-05-24 – 2016-05-25 (×2): 10 mg via RECTAL
  Filled 2016-05-24 (×2): qty 1

## 2016-05-24 NOTE — Progress Notes (Signed)
Patient very upset, crying about going to [facility].  Spoke with Dr. Daphine Deutscher, he states that if we are able to care for patient, then we can keep the patient here at [facility].  Reviewed orders and plan of care, department is able to care for patient.  Will cont to monitor.

## 2016-05-24 NOTE — Progress Notes (Signed)
Received call from pt placement.  No bed available on 6N at this time.  Informed pt. Will inform Dr. Abbey Chatters.   Pt on waiting list.

## 2016-05-24 NOTE — Progress Notes (Signed)
   05/24/16 1000  Clinical Encounter Type  Visited With Patient  Visit Type Initial;Psychological support;Spiritual support  Referral From Nurse  Consult/Referral To Chaplain  Spiritual Encounters  Spiritual Needs Emotional;Grief support;Other (Comment) (Pastoral Conversation/Support)  Stress Factors  Patient Stress Factors Major life changes;Family relationships;Health changes   I visited with the patient per referral by the nurse who stated that the patient was feeling down today.  The patient was sitting in bed and receptive to my visit.  Erika [address] stated that she is a very faithful woman and her [name] faith is important in her daily life.  She said that she has been going through mental health issues and works with her doctors, who she trusts deeply.  Her main concern today is that she is going through grief over her mother's health changes and the loss of who her mother was in the past. Erika Mccoy is upset that her mother can't take care of herself like she used to and that her mental health status has changed.  Erika Mccoy is concerned that her feelings are "not normal." We worked on normalizing her grief and discussed where that grief is rooted.  Erika Mccoy said that she has been working with her mental health doctors surrounding issues of loss and trauma, and is very self-aware of her emotions.  The PA entered the room during the conversation and I offered to come back at a later time.   Will follow up with the patient.   Chaplain Clint Bolder M.Div.
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
## 2016-05-25 LAB — CBC
HCT: 30.7 % — ABNORMAL LOW (ref 36.0–46.0)
Hemoglobin: 10.3 g/dL — ABNORMAL LOW (ref 12.0–15.0)
MCH: 30.7 pg (ref 26.0–34.0)
MCHC: 33.6 g/dL (ref 30.0–36.0)
MCV: 91.6 fL (ref 78.0–100.0)
PLATELETS: 240 10*3/uL (ref 150–400)
RBC: 3.35 MIL/uL — ABNORMAL LOW (ref 3.87–5.11)
RDW: 12.4 % (ref 11.5–15.5)
WBC: 5.4 10*3/uL (ref 4.0–10.5)

## 2016-05-25 LAB — BASIC METABOLIC PANEL
Anion gap: 6 (ref 5–15)
BUN: 8 mg/dL (ref 6–20)
CHLORIDE: 98 mmol/L — AB (ref 101–111)
CO2: 32 mmol/L (ref 22–32)
CREATININE: 0.69 mg/dL (ref 0.44–1.00)
Calcium: 9.1 mg/dL (ref 8.9–10.3)
Glucose, Bld: 154 mg/dL — ABNORMAL HIGH (ref 65–99)
Potassium: 3.7 mmol/L (ref 3.5–5.1)
SODIUM: 136 mmol/L (ref 135–145)

## 2016-05-25 MED ORDER — LACTATED RINGERS IV BOLUS (SEPSIS): 1000.0000 mL | Freq: Three times a day (TID) | INTRAVENOUS | Status: DC | PRN

## 2016-05-25 MED ORDER — LACTATED RINGERS IV BOLUS (SEPSIS)
1000.0000 mL | Freq: Once | INTRAVENOUS | Status: AC
  Administered 2016-05-25: 1000 mL via INTRAVENOUS

## 2016-05-25 MED ORDER — METHOCARBAMOL 500 MG PO TABS
750.0000 mg | ORAL_TABLET | Freq: Three times a day (TID) | ORAL | Status: DC
  Administered 2016-05-25 – 2016-05-26 (×2): 750 mg via ORAL
  Filled 2016-05-25 (×2): qty 2

## 2016-05-25 MED ORDER — DOCUSATE SODIUM 100 MG PO CAPS
100.0000 mg | ORAL_CAPSULE | Freq: Two times a day (BID) | ORAL | Status: DC
  Administered 2016-05-25 – 2016-05-26 (×2): 100 mg via ORAL
  Filled 2016-05-25 (×2): qty 1

## 2016-05-25 MED ORDER — POLYETHYLENE GLYCOL 3350 17 G PO PACK
17.0000 g | PACK | Freq: Every day | ORAL | Status: DC
  Administered 2016-05-25 – 2016-05-26 (×2): 17 g via ORAL
  Filled 2016-05-25 (×2): qty 1

## 2016-05-25 MED ORDER — ENSURE ENLIVE PO LIQD
1.0000 | Freq: Two times a day (BID) | ORAL | Status: DC
  Administered 2016-05-25: 237 mL via ORAL

## 2016-05-25 MED ORDER — HYDROMORPHONE HCL 2 MG/ML IJ SOLN
0.5000 mg | INTRAMUSCULAR | Status: DC | PRN
  Administered 2016-05-25: 0.5 mg via INTRAVENOUS
  Administered 2016-05-26 (×3): 2 mg via INTRAVENOUS
  Filled 2016-05-25 (×4): qty 1

## 2016-05-25 NOTE — Progress Notes (Signed)
Called to patient room . Pt dry heaving. States became nauseated as soon as started to eat. Instructed to not to try to continuing eating for now. Prn for nausea given.

## 2016-05-25 NOTE — Progress Notes (Addendum)
Patient complains of feeling weird.  Nurse in to see patient.  Vital signs obtained.  Patient states worse when out of bed ambulating.  Orthostatic vital signs obtained with no blood pressure change heart rate with slight increase.  Patient currently alert and oriented with only complaint of abdominal pain at level of 8.  Lab in to check bmet per order.  Will alert Medical laboratory scientific officer of patient assessment,  Patient refuses IV dilaudid for pain at this time and has already received po pain meds

## 2016-05-25 NOTE — Progress Notes (Signed)
   05/25/16 1000  Clinical Encounter Type  Visited With Patient  Visit Type Follow-up;Psychological support;Spiritual support  Referral From Patient  Consult/Referral To Chaplain  Spiritual Encounters  Spiritual Needs Emotional;Other (Comment);Prayer (Pastoral Conversation/Support)  Stress Factors  Patient Stress Factors Major life changes;Family relationships;Health changes   I followed up with the patient per our previous interaction.  The patient stated that she was feeling much better today emotionally and physically.   The patient stated that she hasn't had good emotional support from her 3 children, who do not understand her mental illness and daily struggles. [name] feels that she is doing all that she can through medication, counseling and utilization of her [name] faith to heal from her mental illness.  [name] states that she has a history of self-harm and that she has been working towards learning better coping mechanisms for her depression. Her therapist is aware of this and is working with her. She said that she feels like this is the first year in awhile that she can say that she wants to be alive. She feels that her therapy and psychiatric care is helping her.  Her spirituality is extremely important to her and she requested that I pray.  I prayed at the bedside.  Will follow up with the patient while she is here. Please, contact Spiritual Care for further assistance.   Chaplain Clint Bolder M.Div.  She has been
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
## 2016-05-25 NOTE — Discharge Instructions (Signed)
[facility]      [facility] [phone]  OPEN ABDOMINAL SURGERY: POST OP INSTRUCTIONS  Always review your discharge instruction sheet given to you by the facility where your surgery was performed.  IF YOU HAVE DISABILITY OR FAMILY LEAVE FORMS, YOU MUST BRING THEM TO THE OFFICE FOR PROCESSING.  PLEASE DO NOT GIVE THEM TO YOUR DOCTOR.  1. A prescription for pain medication may be given to you upon discharge.  Take your pain medication as prescribed, if needed.  If narcotic pain medicine is not needed, then you may take acetaminophen (Tylenol) or ibuprofen (Advil) as needed. 2. Take your usually prescribed medications unless otherwise directed. 3. If you need a refill on your pain medication, please contact your pharmacy. They will contact our office to request authorization.  Prescriptions will not be filled after 5pm or on week-ends. 4. You should follow a light diet the first few days after arrival home, such as soup and crackers, pudding, etc.unless your doctor has advised otherwise. A high-fiber, low fat diet can be resumed as tolerated.   Be sure to include lots of fluids daily. Most patients will experience some swelling and bruising on the chest and neck area.  Ice packs will help.  Swelling and bruising can take several days to resolve 5. Most patients will experience some swelling and bruising in the area of the incision. Ice pack will help. Swelling and bruising can take several days to resolve..  6. It is common to experience some constipation if taking pain medication after surgery.  Increasing fluid intake and taking a stool softener will usually help or prevent this problem from occurring.  A mild laxative (Milk of Magnesia or Miralax) should be taken according to package directions if there are no bowel movements after 48 hours. 7.  You may have steri-strips (small skin tapes) in place directly over the incision.  These strips should be left on the skin for 7-10 days.  If your  surgeon used skin glue on the incision, you may shower in 24 hours.  The glue will flake off over the next 2-3 weeks.  Any sutures or staples will be removed at the office during your follow-up visit. You may find that a light gauze bandage over your incision may keep your staples from being rubbed or pulled. You may shower and replace the bandage daily. 8. ACTIVITIES:  You may resume regular (light) daily activities beginning the next day--such as daily self-care, walking, climbing stairs--gradually increasing activities as tolerated.  You may have sexual intercourse when it is comfortable.  Refrain from any heavy lifting or straining until approved by your doctor. a. You may drive when you no longer are taking prescription pain medication, you can comfortably wear a seatbelt, and you can safely maneuver your car and apply brakes b. Return to Work: ___________________________________ 9. You should see your doctor in the office for a follow-up appointment approximately two weeks after your surgery.  Make sure that you call for this appointment within a day or two after you arrive home to insure a convenient appointment time. OTHER INSTRUCTIONS:  _____________________________________________________________ _____________________________________________________________  WHEN TO CALL YOUR DOCTOR: 1. Fever over 101.0 2. Inability to urinate 3. Nausea and/or vomiting 4. Extreme swelling or bruising 5. Continued bleeding from incision. 6. Increased pain, redness, or drainage from the incision. 7. Difficulty swallowing or breathing 8. Muscle cramping or spasms. 9. Numbness or tingling in hands or feet or around lips.  The clinic staff is available to   answer your questions during regular business hours.  Please don't hesitate to call and ask to speak to one of the nurses if you have concerns.  For further questions, please visit [email or web address]   

## 2016-05-26 LAB — CBC
HCT: 30 % — ABNORMAL LOW (ref 36.0–46.0)
HEMOGLOBIN: 10.1 g/dL — AB (ref 12.0–15.0)
MCH: 31 pg (ref 26.0–34.0)
MCHC: 33.7 g/dL (ref 30.0–36.0)
MCV: 92 fL (ref 78.0–100.0)
Platelets: 221 10*3/uL (ref 150–400)
RBC: 3.26 MIL/uL — AB (ref 3.87–5.11)
RDW: 12.3 % (ref 11.5–15.5)
WBC: 6.5 10*3/uL (ref 4.0–10.5)

## 2016-05-26 MED ORDER — METHOCARBAMOL 750 MG PO TABS: 750.0000 mg | ORAL_TABLET | Freq: Three times a day (TID) | ORAL | 0 refills | Status: DC | PRN

## 2016-05-26 MED ORDER — POLYETHYLENE GLYCOL 3350 17 G PO PACK
17.0000 g | PACK | Freq: Every day | ORAL | 0 refills | Status: DC
Start: 2016-05-26 — End: 2016-06-13

## 2016-05-26 MED ORDER — ENSURE ENLIVE PO LIQD: 1.0000 | Freq: Two times a day (BID) | ORAL | 12 refills | Status: DC

## 2016-05-26 MED ORDER — ONDANSETRON 4 MG PO TBDP: 4.0000 mg | ORAL_TABLET | Freq: Four times a day (QID) | ORAL | 0 refills | Status: DC | PRN

## 2016-05-26 MED ORDER — BISACODYL 10 MG RE SUPP: 10.0000 mg | Freq: Every day | RECTAL | 0 refills | Status: DC | PRN

## 2016-05-26 MED ORDER — OXYCODONE HCL 5 MG PO TABS: 5.0000 mg | ORAL_TABLET | ORAL | 0 refills | Status: DC | PRN

## 2016-05-27 ENCOUNTER — Telehealth: Payer: Self-pay | Admitting: Obstetrics & Gynecology

## 2016-05-27 NOTE — Telephone Encounter (Signed)
Spoke with patient, advised as seen below per Dr. Miller. Patient verbalizes understanding and is agreeable.  Routing to provider for final review. Patient is agreeable to disposition. Will close encounter.  

## 2016-05-27 NOTE — Telephone Encounter (Signed)
Return call to patient, mailbox full, unable to leave message.

## 2016-05-27 NOTE — Telephone Encounter (Signed)
Mailbox full, can not accept messages at this time. 
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
## 2016-05-27 NOTE — Telephone Encounter (Signed)
She had a significant surgery and has risk for post op DVT/PE so that is likely why it was not replaced.  She should not restart using it for six weeks post op.  I'm sorry.
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
## 2016-05-27 NOTE — Telephone Encounter (Signed)
Patient is returning your call.  
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
## 2016-06-07 ENCOUNTER — Encounter: Payer: Self-pay | Admitting: Nurse Practitioner

## 2016-06-07 ENCOUNTER — Ambulatory Visit (INDEPENDENT_AMBULATORY_CARE_PROVIDER_SITE_OTHER): Payer: BLUE CROSS/BLUE SHIELD | Admitting: Nurse Practitioner

## 2016-06-07 VITALS — BP 112/68 | HR 60 | Ht 65.0 in | Wt 126.0 lb

## 2016-06-07 DIAGNOSIS — N3943 Post-void dribbling: Secondary | ICD-10-CM

## 2016-06-07 DIAGNOSIS — N760 Acute vaginitis: Secondary | ICD-10-CM | POA: Diagnosis not present

## 2016-06-07 LAB — POCT URINALYSIS DIPSTICK
Bilirubin, UA: NEGATIVE
GLUCOSE UA: NEGATIVE
KETONES UA: NEGATIVE
Nitrite, UA: NEGATIVE
Protein, UA: NEGATIVE
UROBILINOGEN UA: NEGATIVE
pH, UA: 5

## 2016-06-07 MED ORDER — FLUCONAZOLE 150 MG PO TABS: 150.0000 mg | ORAL_TABLET | Freq: Once | ORAL | 0 refills | Status: AC

## 2016-06-07 MED ORDER — METRONIDAZOLE 0.75 % VA GEL: 1.0000 | Freq: Every day | VAGINAL | 0 refills | Status: DC

## 2016-06-07 NOTE — Patient Instructions (Signed)
Bacterial Vaginosis Bacterial vaginosis is a vaginal infection that occurs when the normal balance of bacteria in the vagina is disrupted. It results from an overgrowth of certain bacteria. This is the most common vaginal infection among women ages 15-44. Because bacterial vaginosis increases your risk for STIs (sexually transmitted infections), getting treated can help reduce your risk for chlamydia, gonorrhea, herpes, and HIV (human immunodeficiency virus). Treatment is also important for preventing complications in pregnant women, because this condition can cause an early (premature) delivery. What are the causes? This condition is caused by an increase in harmful bacteria that are normally present in small amounts in the vagina. However, the reason that the condition develops is not fully understood. What increases the risk? The following factors may make you more likely to develop this condition:  Having a new sexual partner or multiple sexual partners.  Having unprotected sex.  Douching.  Having an intrauterine device (IUD).  Smoking.  Drug and alcohol abuse.  Taking certain antibiotic medicines.  Being pregnant.  You cannot get bacterial vaginosis from toilet seats, bedding, swimming pools, or contact with objects around you. What are the signs or symptoms? Symptoms of this condition include:  Grey or white vaginal discharge. The discharge can also be watery or foamy.  A fish-like odor with discharge, especially after sexual intercourse or during menstruation.  Itching in and around the vagina.  Burning or pain with urination.  Some women with bacterial vaginosis have no signs or symptoms. How is this diagnosed? This condition is diagnosed based on:  Your medical history.  A physical exam of the vagina.  Testing a sample of vaginal fluid under a microscope to look for a large amount of bad bacteria or abnormal cells. Your health care provider may use a cotton swab  or a small wooden spatula to collect the sample.  How is this treated? This condition is treated with antibiotics. These may be given as a pill, a vaginal cream, or a medicine that is put into the vagina (suppository). If the condition comes back after treatment, a second round of antibiotics may be needed. Follow these instructions at home: Medicines  Take over-the-counter and prescription medicines only as told by your health care provider.  Take or use your antibiotic as told by your health care provider. Do not stop taking or using the antibiotic even if you start to feel better. General instructions  If you have a female sexual partner, tell her that you have a vaginal infection. She should see her health care provider and be treated if she has symptoms. If you have a female sexual partner, he does not need treatment.  During treatment: ? Avoid sexual activity until you finish treatment. ? Do not douche. ? Avoid alcohol as directed by your health care provider. ? Avoid breastfeeding as directed by your health care provider.  Drink enough water and fluids to keep your urine clear or pale yellow.  Keep the area around your vagina and rectum clean. ? Wash the area daily with warm water. ? Wipe yourself from front to back after using the toilet.  Keep all follow-up visits as told by your health care provider. This is important. How is this prevented?  Do not douche.  Wash the outside of your vagina with warm water only.  Use protection when having sex. This includes latex condoms and dental dams.  Limit how many sexual partners you have. To help prevent bacterial vaginosis, it is best to have sex with just   one partner (monogamous).  Make sure you and your sexual partner are tested for STIs.  Wear cotton or cotton-lined underwear.  Avoid wearing tight pants and pantyhose, especially during summer.  Limit the amount of alcohol that you drink.  Do not use any products that  contain nicotine or tobacco, such as cigarettes and e-cigarettes. If you need help quitting, ask your health care provider.  Do not use illegal drugs. Where to find more information:  Centers for Disease Control and Prevention: www.cdc.gov/std  American Sexual Health Association ([name]): www.ashastd.org  U.S. Department of Health and Human Services, Office on Women's Health: www.womenshealth.gov/ or https://www.womenshealth.gov/a-z-topics/bacterial-vaginosis Contact a health care provider if:  Your symptoms do not improve, even after treatment.  You have more discharge or pain when urinating.  You have a fever.  You have pain in your abdomen.  You have pain during sex.  You have vaginal bleeding between periods. Summary  Bacterial vaginosis is a vaginal infection that occurs when the normal balance of bacteria in the vagina is disrupted.  Because bacterial vaginosis increases your risk for STIs (sexually transmitted infections), getting treated can help reduce your risk for chlamydia, gonorrhea, herpes, and HIV (human immunodeficiency virus). Treatment is also important for preventing complications in pregnant women, because the condition can cause an early (premature) delivery.  This condition is treated with antibiotic medicines. These may be given as a pill, a vaginal cream, or a medicine that is put into the vagina (suppository). This information is not intended to replace advice given to you by your health care provider. Make sure you discuss any questions you have with your health care provider. Document Released: 05/02/2005 Document Revised: 01/16/2016 Document Reviewed: 01/16/2016 Elsevier Interactive Patient Education  2017 [employer]vier Inc.  
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
## 2016-06-08 LAB — WET PREP BY MOLECULAR PROBE
Candida species: NEGATIVE
Gardnerella vaginalis: NEGATIVE
Trichomonas vaginosis: NEGATIVE

## 2016-06-08 LAB — URINALYSIS, MICROSCOPIC ONLY
BACTERIA UA: NONE SEEN [HPF]
CASTS: NONE SEEN [LPF]
Crystals: NONE SEEN [HPF]
WBC, UA: NONE SEEN WBC/HPF (ref <=5)
YEAST: NONE SEEN [HPF]

## 2016-06-08 LAB — URINE CULTURE: Organism ID, Bacteria: NO GROWTH

## 2016-06-12 NOTE — Progress Notes (Signed)
Encounter reviewed by Dr. Brook Amundson C. Silva.  

## 2016-06-13 ENCOUNTER — Encounter: Payer: Self-pay | Admitting: Nurse Practitioner

## 2016-06-13 ENCOUNTER — Ambulatory Visit (INDEPENDENT_AMBULATORY_CARE_PROVIDER_SITE_OTHER): Payer: BLUE CROSS/BLUE SHIELD | Admitting: Nurse Practitioner

## 2016-06-13 VITALS — BP 90/58 | HR 96 | Resp 15 | Wt 128.0 lb

## 2016-06-13 DIAGNOSIS — N760 Acute vaginitis: Secondary | ICD-10-CM

## 2016-06-13 NOTE — Progress Notes (Signed)
Patient ID: Erika Mccoy, female   DOB: [birth year], 51 y.o.   MRN: [id number]  51 y.o. Married Caucasian female G2P2003 here with complaint of vaginal symptoms of itching, burning, and increase discharge. Describes discharge as thick and heavy like a gel substance.  When she was here last week she presumptively had BV and was given Metrogel.  When the results cam back and was negative - she continued the Metrogel at least 7-8 days.  She is now taking 4 baths a day just to get the discharge out.  She describes this as "Vaseline like".  My thoughts is this is most likely the Metrogel.  She was SA last pm.  Onset of symptoms 30 days ago. Denies new personal products or vaginal dryness.  No STD concerns but still wants GC and Chlamydia due to amount of discharge. Urinary symptoms none . Contraception is ablation.   O:  Healthy female WDWN Affect: normal, orientation x 3  Exam: no distress Abdomen: at incision of repair of liver there is one area at base that is slightly open - band aid is covering Lymph node: no enlargement or tenderness Pelvic exam: External genital: normal female BUS: negative Vagina: minimal thin white discharge noted.  Affirm taken. Cervix: normal, non tender, no CMT Uterus: normal, non tender Adnexa:normal, non tender, no masses or fullness noted    A: Vaginitis  R/O STD's   P: Discussed findings of vaginitis and etiology. Discussed Aveeno or baking soda sitz bath for comfort.  Rx: await test results  Follow with Affirm  RV prn

## 2016-06-13 NOTE — Patient Instructions (Addendum)
Will call with results

## 2016-06-14 LAB — WET PREP BY MOLECULAR PROBE
CANDIDA SPECIES: NEGATIVE
Gardnerella vaginalis: NEGATIVE
Trichomonas vaginosis: NEGATIVE

## 2016-06-15 ENCOUNTER — Telehealth: Payer: Self-pay | Admitting: Nurse Practitioner

## 2016-06-15 LAB — GC/CHLAMYDIA PROBE AMP
CT PROBE, AMP APTIMA: NOT DETECTED
GC PROBE AMP APTIMA: NOT DETECTED

## 2016-06-15 NOTE — Telephone Encounter (Signed)
Left message to call Erika Mccoy at [phone].   Notes Recorded by Ria Comment, FNP on 06/15/2016 at 12:49 PM EST Please let pt know that Affirm and GC & Chl were all negative.

## 2016-06-15 NOTE — Telephone Encounter (Signed)
Spoke with patient, advised of results as seen below per Ria Comment, NP. Patient verbalizes understanding and is agreeable. Patient thankful for return call.  Routing to provider for final review. Patient is agreeable to disposition. Will close encounter.

## 2016-06-15 NOTE — Telephone Encounter (Signed)
Patient calling for results.

## 2016-06-15 NOTE — Telephone Encounter (Signed)
Ria Comment, NP -can you review results from 06/13/16?

## 2016-06-15 NOTE — Telephone Encounter (Signed)
The GC and Chlamydia was for some reason late coming back and I just sent note to [facility] -can you get that note and call her back.  Thanks.

## 2016-06-15 NOTE — Progress Notes (Signed)
Encounter reviewed by Dr. Brook Amundson C. Silva.  

## 2016-06-20 ENCOUNTER — Encounter: Payer: Self-pay | Admitting: Nurse Practitioner

## 2016-06-21 ENCOUNTER — Telehealth: Payer: Self-pay | Admitting: Nurse Practitioner

## 2016-06-21 ENCOUNTER — Ambulatory Visit (INDEPENDENT_AMBULATORY_CARE_PROVIDER_SITE_OTHER): Payer: BLUE CROSS/BLUE SHIELD | Admitting: Nurse Practitioner

## 2016-06-21 ENCOUNTER — Encounter: Payer: Self-pay | Admitting: Nurse Practitioner

## 2016-06-21 VITALS — BP 112/70 | HR 76 | Resp 20 | Ht 64.5 in | Wt 123.6 lb

## 2016-06-21 DIAGNOSIS — Z01419 Encounter for gynecological examination (general) (routine) without abnormal findings: Secondary | ICD-10-CM

## 2016-06-21 DIAGNOSIS — Z0000 Encounter for general adult medical examination without abnormal findings: Secondary | ICD-10-CM | POA: Diagnosis not present

## 2016-06-21 DIAGNOSIS — N959 Unspecified menopausal and perimenopausal disorder: Secondary | ICD-10-CM | POA: Diagnosis not present

## 2016-06-21 LAB — CBC WITH DIFFERENTIAL/PLATELET
Basophils Absolute: 0 cells/uL (ref 0–200)
Basophils Relative: 0 %
Eosinophils Absolute: 355 cells/uL (ref 15–500)
Eosinophils Relative: 5 %
HEMATOCRIT: 38.5 % (ref 35.0–45.0)
HEMOGLOBIN: 12.6 g/dL (ref 11.7–15.5)
LYMPHS ABS: 1775 {cells}/uL (ref 850–3900)
Lymphocytes Relative: 25 %
MCH: 30.7 pg (ref 27.0–33.0)
MCHC: 32.7 g/dL (ref 32.0–36.0)
MCV: 93.9 fL (ref 80.0–100.0)
MONO ABS: 568 {cells}/uL (ref 200–950)
MPV: 9.3 fL (ref 7.5–12.5)
Monocytes Relative: 8 %
NEUTROS PCT: 62 %
Neutro Abs: 4402 cells/uL (ref 1500–7800)
Platelets: 188 10*3/uL (ref 140–400)
RBC: 4.1 MIL/uL (ref 3.80–5.10)
RDW: 13.1 % (ref 11.0–15.0)
WBC: 7.1 10*3/uL (ref 3.8–10.8)

## 2016-06-21 LAB — LIPID PANEL
Cholesterol: 163 mg/dL (ref <200)
HDL: 63 mg/dL (ref >50)
LDL CALC: 69 mg/dL (ref <100)
Total CHOL/HDL Ratio: 2.6 Ratio (ref <5.0)
Triglycerides: 154 mg/dL — ABNORMAL HIGH (ref <150)
VLDL: 31 mg/dL — AB (ref <30)

## 2016-06-21 MED ORDER — MEDROXYPROGESTERONE ACETATE 2.5 MG PO TABS
2.5000 mg | ORAL_TABLET | Freq: Every day | ORAL | 4 refills | Status: DC
Start: 2016-06-21 — End: 2016-09-09

## 2016-06-21 MED ORDER — ESTRADIOL 0.05 MG/24HR TD PTTW: 1.0000 | MEDICATED_PATCH | TRANSDERMAL | 4 refills | Status: DC

## 2016-06-21 MED ORDER — FLUCONAZOLE 150 MG PO TABS: 150.0000 mg | ORAL_TABLET | Freq: Once | ORAL | 0 refills | Status: AC

## 2016-06-21 NOTE — Progress Notes (Signed)
I would recommend recheck in about a month after she starts the HRT.  I'm happy to see her if you'd like input with her care.  Reviewed personally.  Lum Keas, MD.

## 2016-06-21 NOTE — Telephone Encounter (Signed)
Forwarding to Ria Comment, NP to review and advise on follow-up?  Cc: Dr. Hyacinth Meeker
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
## 2016-06-21 NOTE — Telephone Encounter (Signed)
Patient cancelled med recheck appointment with Dr Hyacinth Meeker Thursday because she was not able to start the patch. Said she discussed with Patty today and will wait to hear back on when she need to come back in.

## 2016-06-21 NOTE — Telephone Encounter (Signed)
OK - the visit from today was forwarded to Dr. Hyacinth Meeker for her to decide when she wants to see her back.

## 2016-06-21 NOTE — Progress Notes (Signed)
Per Dr. Hyacinth Meeker she wants to see her in a months after starting back on HRT.

## 2016-06-21 NOTE — Patient Instructions (Addendum)

## 2016-06-22 LAB — VITAMIN D 25 HYDROXY (VIT D DEFICIENCY, FRACTURES): VIT D 25 HYDROXY: 55 ng/mL (ref 30–100)

## 2016-06-22 NOTE — Telephone Encounter (Signed)
Pt should come back four to six weeks after starting the patch.  Thanks.

## 2016-06-22 NOTE — Telephone Encounter (Signed)
Left message to call Soriah Leeman at [phone].  

## 2016-06-23 ENCOUNTER — Ambulatory Visit: Payer: BLUE CROSS/BLUE SHIELD | Admitting: Obstetrics & Gynecology

## 2016-06-24 NOTE — Telephone Encounter (Signed)
Spoke with patient. Appointment for recheck scheduled for 08/09/2016 at 1 pm with Dr.Miller. Patient is agreeable to date and time.  Routing to provider for final review. Patient agreeable to disposition. Will close encounter.

## 2016-06-27 ENCOUNTER — Other Ambulatory Visit: Payer: Self-pay | Admitting: Nurse Practitioner

## 2016-06-27 ENCOUNTER — Telehealth: Payer: Self-pay | Admitting: Nurse Practitioner

## 2016-06-27 MED ORDER — NONFORMULARY OR COMPOUNDED ITEM: 2 refills | Status: DC

## 2016-06-27 NOTE — Telephone Encounter (Signed)
Spoke with patient, advised as seen below per Ria Comment, NP. Patient states she has tried the [employer] and is still uncomfortable. Patient states she discussed with Mrs. Patti the boric acid, but did not get a prescription at the OV, decided to wait. Advised patient would send RX for boric acid to [facility]. Patient verbalizes understanding and is agreeable.  Ria Comment, NP -will need RX for boric acid. Can you clarify dosage and amount to be dispensed?

## 2016-06-27 NOTE — Telephone Encounter (Signed)
Patient says diflucan is not working. She's still having problems.

## 2016-06-27 NOTE — Telephone Encounter (Signed)
She may use the Boric acid capsule daily X 3.  Also Aveeno bath prn will be helpful.  She needs to rest and heal with her recent surgery  Does not need OV at this time.  If she needs a refill on Boric acid capsule that is OK

## 2016-06-27 NOTE — Telephone Encounter (Signed)
Spoke with patient. Patient states she was in for AEX 06/21/2016 and was prescribed diflucan for yeast. Patient states she took one that day and the other 5 days later. Patient states she is still having white/yellow vaginal discharge, but amount has decreased. Patient reports vaginal area is uncomfortable and still having vaginal itching, but has decreased. Patient denies any urinary complaints. Patient states she has tried boric acid in past and has worked. Patient states she has over done herself and is having pain from abdominal surgery. Patient reports she can not drive right now. Advised patient would review with Ria Comment, NP for recommendations and return call, patient is agreeable.  Ria Comment, NP -please advise?

## 2016-06-27 NOTE — Telephone Encounter (Signed)
Order is done and printed and sent back to you for faxing.

## 2016-06-28 ENCOUNTER — Other Ambulatory Visit: Payer: Self-pay | Admitting: Nurse Practitioner

## 2016-06-28 NOTE — Telephone Encounter (Signed)
Order faxed to [facility].  Routing to provider for final review. Patient is agreeable to disposition. Will close encounter.

## 2016-06-29 NOTE — Telephone Encounter (Signed)
Spoke with patient. Patient states she is at [facility] and they do not have the boric acid RX. Advised patient that printed copy of RX for boric acid was faxed to [facility] 06/28/16, since this is a compounded medication, can take a couple of days to prepare. Advised patient to f/u with [facility], return call to office for any additional questions.  Routing to provider for final review. Patient is agreeable to disposition. Will close encounter.

## 2016-07-13 ENCOUNTER — Telehealth: Payer: Self-pay | Admitting: Obstetrics & Gynecology

## 2016-07-13 NOTE — Telephone Encounter (Signed)
When here for AEX she just had a light cycle on 06/16/16.  So the spotting may also be just a light cycle now.  If the introitus was very tender due to SA that also may have caused the spotting.  She is not back on HRT yet so do not think this is related.  For her vaginal PH problems:  We have done numerous Affirm and all have been negative.  I do advise to continue with Boric acid capsules as she is doing.  The upcoming surgery was to be revision of her breast implants.  But she does need to stay off HRT until 6 weeks post surgery.

## 2016-07-13 NOTE — Telephone Encounter (Signed)
Spoke with patient, advised as seen below per Ria Comment, NP. Patient states she does not wish to schedule OV at this time, will return call if any changes in bleeding or for any additional questions. Patient thankful for return call, verbalizes understanding and is agreeable.  Routing to provider for final review. Patient is agreeable to disposition. Will close encounter.

## 2016-07-13 NOTE — Telephone Encounter (Signed)
Unable to leave message,mailbox full. 

## 2016-07-13 NOTE — Telephone Encounter (Signed)
Patient has spotted 3 times in the past 20 days.  Says they are not periods.  Wants to know should she be worried about this.

## 2016-07-13 NOTE — Telephone Encounter (Signed)
Spoke with patient. Patient states she has had 3 episodes of spotting in the last 15 days. Patient experiences cramping with spotting. Reports spotting last for a day. Patient states she has ongoing white vaginal discharge and itching, is currently using boric acid suppositories weekly. Patient states nothing seems to help and my ph is way off. Patient states intercourse is sometimes painful, reports this is not new. Patient asking if possibly allergic to coconut oil since ph stays off balance? Patient reports she has not re-started hormone therapy d/t recent injury and possibly another surgery coming up. Advised patient will review with Ria Comment, NP and return call with recommendations, patient is agreeable.  Ria Comment, NP -please advise?

## 2016-07-21 NOTE — Telephone Encounter (Signed)
Spoke with patient. Patient states something is very wrong and she may need a a secialist. Patient states [facility] but that would not help the vaginal discharge she is experiencing. Patient states she can't continue to have the this vaginal discharge that is preventing her from having sex, it is disgusting. Patient states something will need to be done, nothing is helping. Recommended OV for further evaluation with Dr. Hyacinth Meeker. Patient declined OV for today, patient scheduled for 3/9 at 10am. Patient verbalizes understanding and is agreeable to date and time.  Routing to provider for final review. Patient is agreeable to disposition. Will close encounter.

## 2016-07-21 NOTE — Telephone Encounter (Signed)
Patient has been having abnormal discharge for 4 months. She states boric acid, Aveeno baths and Diflucan are not working either. She also states the affirms have come back negative so she is concerned this discharge is coming from somewhere else.

## 2016-07-21 NOTE — Telephone Encounter (Signed)
Left message to call Bernis Stecher at [phone].  

## 2016-07-22 ENCOUNTER — Ambulatory Visit (INDEPENDENT_AMBULATORY_CARE_PROVIDER_SITE_OTHER): Payer: BLUE CROSS/BLUE SHIELD | Admitting: Obstetrics & Gynecology

## 2016-07-22 ENCOUNTER — Encounter: Payer: Self-pay | Admitting: Obstetrics & Gynecology

## 2016-07-22 VITALS — BP 116/62 | HR 92 | Resp 16 | Ht 64.5 in | Wt 124.0 lb

## 2016-07-22 DIAGNOSIS — N898 Other specified noninflammatory disorders of vagina: Secondary | ICD-10-CM

## 2016-07-22 DIAGNOSIS — Z202 Contact with and (suspected) exposure to infections with a predominantly sexual mode of transmission: Secondary | ICD-10-CM | POA: Diagnosis not present

## 2016-07-22 LAB — HIV ANTIBODY (ROUTINE TESTING W REFLEX): HIV: NONREACTIVE

## 2016-07-22 MED ORDER — TERCONAZOLE 0.4 % VA CREA: 1.0000 | TOPICAL_CREAM | Freq: Every day | VAGINAL | 0 refills | Status: DC
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
## 2016-07-28 ENCOUNTER — Telehealth: Payer: Self-pay | Admitting: Obstetrics & Gynecology

## 2016-07-28 LAB — SURESWAB, VAGINOSIS/VAGINITIS PLUS
Atopobium vaginae: NOT DETECTED Log (cells/mL)
C. TRACHOMATIS RNA, TMA: NOT DETECTED
C. albicans, DNA: NOT DETECTED
C. glabrata, DNA: NOT DETECTED
C. parapsilosis, DNA: NOT DETECTED
C. tropicalis, DNA: NOT DETECTED
Gardnerella vaginalis: <4.7 Log (cells/mL)
LACTOBACILLUS SPECIES: 7.9 Log (cells/mL)
MEGASPHAERA SPECIES: NOT DETECTED Log (cells/mL)
N. gonorrhoeae RNA, TMA: NOT DETECTED
T. VAGINALIS RNA, QL TMA: NOT DETECTED

## 2016-07-28 NOTE — Telephone Encounter (Signed)
Dr. Hyacinth Meeker -please review 07/22/16 SureSwab results and advise?

## 2016-07-28 NOTE — Telephone Encounter (Signed)
Patient would like to get her test results

## 2016-07-29 ENCOUNTER — Telehealth: Payer: Self-pay | Admitting: *Deleted

## 2016-07-29 MED ORDER — ESTRADIOL 0.1 MG/GM VA CREA: TOPICAL_CREAM | VAGINAL | 0 refills | Status: DC

## 2016-07-29 NOTE — Telephone Encounter (Signed)
-----   Message from Jerene Bears, MD sent at 07/29/2016  7:11 AM EDT ----- Please notify pt that the entire sure swab test was negative--yeast, BV, trich, GC and chl--all negative.  Please confirm that she was starting back on her HRT patch.  Should start vaginal estrogen cream--estrace cream 1 gram pv twice weekly.  Follow up 4 weeks.

## 2016-07-29 NOTE — Telephone Encounter (Signed)
This was routed to Arnold Long who will call the patient.  Ok to close encounter.

## 2016-07-29 NOTE — Telephone Encounter (Signed)
I would recommend hydrogen peroxide and water mixed 1:1 and use this as a douche.  The smell is most likely due to menopausal hormonal change--that is why I am starting the estrogen cream.  I'd start the estrogen after her breast surgery is completed.  Thanks.

## 2016-07-29 NOTE — Telephone Encounter (Signed)
See Result note. Encounter closed.

## 2016-07-29 NOTE — Telephone Encounter (Addendum)
Call to patient. Results given to patient as seen below from Dr. Hyacinth Meeker. Patient becomes upset on phone asking "well what is it then if everything is negative? Where is the smell coming from?" Patient states she would like to know if she can do vinegar cleanses? States, "I don't have to smell like roses, but I want this smell to go away!" Patient also states she is having surgery to have her breast implants redone on March 22nd, and per Dr. Stephens November, he wanted her to stop the HRT patches until after the surgery. Patient also asking if she should start the estrace cream before surgery or wait until after too? RN advised patient this message would be sent to Dr. Hyacinth Meeker for review and our office would call with additional recommendations. Pharmacy on file verified. Will call in prescription once Dr. Hyacinth Meeker reviews. Patient also declines to schedule follow up at this time. Will call back at her convenience.   Routing to provider for review.

## 2016-07-29 NOTE — Telephone Encounter (Signed)
Message left for patient per DPR, with message from Dr. Hyacinth Meeker as seen below. Instructed patient prescription would be called in to her pharmacy for estrogen cream, but to not start that until after her breast surgery is completed. Instructed patient to return call with any questions.   Routing to provider for final review. Patient agreeable to disposition. Will close encounter.

## 2016-08-04 ENCOUNTER — Other Ambulatory Visit: Payer: Self-pay | Admitting: Plastic Surgery

## 2016-08-09 ENCOUNTER — Ambulatory Visit: Payer: BLUE CROSS/BLUE SHIELD | Admitting: Obstetrics & Gynecology

## 2016-08-23 ENCOUNTER — Telehealth: Payer: Self-pay

## 2016-08-23 NOTE — Telephone Encounter (Signed)
Reviewed note. Since she has tolerated coffee in the past, this does not sounds like an allergic reaction. Tachycardia is a side effect of caffeine, so that is to be expected. Sometimes consuming coffee on an empty stomach can cause some abdominal discomfort. We can certainly see her this week if it continues. If she is unable to keep fluids down, she should probably go to an Urgent Care for evaluation. This could just be a viral gastroenteritis.   Malachi Bonds, MD FAAAAI [facility]

## 2016-08-23 NOTE — Telephone Encounter (Signed)
Called patient, no answer, unable to leave a message.

## 2016-08-23 NOTE — Telephone Encounter (Signed)
Patient called stating that she was vomiting, shaking, feeling jittery. This is after consuming a very large amount of coffee this morning. She stated that she does not normally consume caffeine, at all. She is extremely worried about how long this is going to continue and if she is going to be okay.

## 2016-08-24 NOTE — Telephone Encounter (Signed)
Patient states that she is fine now. She believes the issue was that she had the coffee on an empty stomach.

## 2016-08-25 ENCOUNTER — Telehealth: Payer: Self-pay | Admitting: Obstetrics & Gynecology

## 2016-08-25 ENCOUNTER — Ambulatory Visit: Payer: BLUE CROSS/BLUE SHIELD | Admitting: Obstetrics & Gynecology

## 2016-08-25 NOTE — Telephone Encounter (Signed)
Patient called and cancelled her appointment for today for a medication recheck with Dr. Hyacinth Meeker.

## 2016-08-30 ENCOUNTER — Emergency Department (HOSPITAL_COMMUNITY)
Admission: EM | Admit: 2016-08-30 | Discharge: 2016-08-30 | Disposition: A | Discharge status: Home / Self Care | Payer: BLUE CROSS/BLUE SHIELD | Attending: Emergency Medicine | Admitting: Emergency Medicine

## 2016-08-30 ENCOUNTER — Encounter (HOSPITAL_COMMUNITY): Payer: Self-pay | Admitting: Emergency Medicine

## 2016-08-30 DIAGNOSIS — Y999 Unspecified external cause status: Secondary | ICD-10-CM | POA: Insufficient documentation

## 2016-08-30 DIAGNOSIS — Z23 Encounter for immunization: Secondary | ICD-10-CM | POA: Diagnosis not present

## 2016-08-30 DIAGNOSIS — Z79899 Other long term (current) drug therapy: Secondary | ICD-10-CM | POA: Diagnosis not present

## 2016-08-30 DIAGNOSIS — X781XXA Intentional self-harm by knife, initial encounter: Secondary | ICD-10-CM | POA: Insufficient documentation

## 2016-08-30 DIAGNOSIS — S81812A Laceration without foreign body, left lower leg, initial encounter: Secondary | ICD-10-CM

## 2016-08-30 DIAGNOSIS — Y939 Activity, unspecified: Secondary | ICD-10-CM | POA: Diagnosis not present

## 2016-08-30 DIAGNOSIS — Y929 Unspecified place or not applicable: Secondary | ICD-10-CM | POA: Insufficient documentation

## 2016-08-30 MED ORDER — TETANUS-DIPHTH-ACELL PERTUSSIS 5-2.5-18.5 LF-MCG/0.5 IM SUSP
0.5000 mL | Freq: Once | INTRAMUSCULAR | Status: AC
  Administered 2016-08-30: 0.5 mL via INTRAMUSCULAR
  Filled 2016-08-30: qty 0.5

## 2016-08-30 MED ORDER — LIDOCAINE-EPINEPHRINE (PF) 2 %-1:200000 IJ SOLN
20.0000 mL | Freq: Once | INTRAMUSCULAR | Status: AC
  Administered 2016-08-30: 20 mL

## 2016-08-30 MED ORDER — BACITRACIN ZINC 500 UNIT/GM EX OINT
TOPICAL_OINTMENT | Freq: Two times a day (BID) | CUTANEOUS | Status: DC
  Filled 2016-08-30: qty 0.9

## 2016-08-30 NOTE — Discharge Instructions (Signed)
7 sutures placed. Please have the sutures removed in 7 days. Watch for signs of infection. Antibiotic ointment to wound. Keep wound clean and dry. Do not soak in a tub with a wound.

## 2016-08-30 NOTE — ED Notes (Signed)
Bed: WTR6 Expected date:  Expected time:  Means of arrival:  Comments: 

## 2016-08-30 NOTE — ED Triage Notes (Signed)
Pt reports that she has a hx of cutting herself to relieve stress and tonight she was under stress and was unable to calm self with other therapeutic tools and were unsuccessful. Pt then stated that she cut herself to her left ankle with a pocket knife. Pt denies any SI/HI at this time. Pt alert and oriented x 4 at this time. Bleeding controlled prior to triage. Pt states she is in therapy weekly for this issue.

## 2016-08-30 NOTE — ED Provider Notes (Signed)
[facility]-EMERGENCY DEPT Provider Note   [name]: [id number] Arrival date & time: 08/30/16  0018     History   Chief Complaint Chief Complaint  Patient presents with  . Laceration    HPI Erika Mccoy is a 51 y.o. female.  HPI 51 y.o. female with a hx of manic depression, bipolar 1 disorder, cutting and dissociation disorder who presents to the Emergency Department complaining of a laceration to the medial aspect of her left ankle s/p injury that occurred  PTA. 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 She states that she has been working hard with her therapist and did not cut herself for 3 months  however today she cut herself with a pocket knife. Bleeding is currently being controlled by a gauze with applied pressure. She has not taken any medications PTA. Pt is unsure of last Tdap. She denies any SI/HI, auditory/visual hallucinations.  Past Medical History:  Diagnosis Date  . Abnormal Pap smear of cervix 1997   --hx of conization of cervix by Dr. Roberto Scales  . Anxiety   . Arm sprain 5/11   right   . Broken arm    left arm by elbow  . Fibromyalgia   . Genital warts   . History of self mutilation   . HSV-2 infection    rare occurence  . 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 IBS (irritable bowel syndrome)   . Interstitial cystitis   . Lichen sclerosus    Vulva  . Manic depression (HCC)   . MS (multiple sclerosis) (HCC)     Patient Active Problem List   Diagnosis Date Noted  . Major laceration of liver with open wound s/p ex lap & repair 05/21/2016 05/23/2016  . Adverse food reaction 01/11/2016  . Mild persistent asthma 01/11/2016  . Chronic rhinitis 01/11/2016  . Non compliance w medication regimen 01/11/2016  . Bipolar disorder, manic (HCC) 05/05/2015  . Fothergill's neuralgia 01/15/2014  . Chronic fatigue syndrome 11/20/2013  . Chronic migraine without aura 11/20/2013  . Rotator cuff syndrome  07/15/2013  . ANA positive 03/12/2013  . PCB (post coital bleeding) 01/18/2013  . Lichen sclerosus 12/03/2012  . Irritable bowel syndrome 12/03/2012  . Interstitial cystitis 12/03/2012  . Hypertriglyceridemia 10/25/2011  . Allergic rhinitis, seasonal 09/28/2011  . 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 Acne 12/21/2010  . 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 Anxiety, generalized 12/09/2010  . Fibromyalgia 12/09/2010    Past Surgical History:  Procedure Laterality Date  . BREAST ENHANCEMENT SURGERY  1990   Saline Implants  . CERVIX LESION DESTRUCTION  1997   Dr. Roberto Scales  . ENDOMETRIAL ABLATION  1997/1998  . LAPAROTOMY N/A 05/21/2016   Procedure: EXPLORATORY LAPAROTOMY, CAUTERIZATION OF LIVER LACERATION, EVACUATION OF HEMOPERITONEUM;  Surgeon: Darnell Level, MD;  Location: [facility];  Service: General;  Laterality: N/A;  . NASAL SINUS SURGERY    . TONSILLECTOMY      OB History    Gravida Para Term Preterm AB Living   2 2 2  0 0 3   SAB TAB Ectopic Multiple Live Births   0 0 0 1 3       Home Medications    Prior to Admission medications   Medication Sig Start Date End Date Taking? Authorizing Provider  BIOTIN 5000 PO Take 5,000 mcg by mouth daily.     Historical Provider, MD  buPROPion (WELLBUTRIN XL) 300 MG 24  hr tablet Take 300 mg by mouth every morning.  01/10/16   Historical Provider, MD  Cholecalciferol (D3-1000) 1000 units capsule Take 5,000 Units by mouth daily.    Historical Provider, MD  clonazePAM (KLONOPIN) 1 MG tablet Take 1 tablet (1 mg total) by mouth 2 (two) times daily. Patient taking differently: Take 1 mg by mouth 3 (three) times daily as needed for anxiety.  05/10/15   Beau Fanny, FNP  dexlansoprazole (DEXILANT) 60 MG capsule Take 60 mg by mouth daily.    Historical Provider, MD  estradiol (ESTRACE VAGINAL) 0.1 MG/GM vaginal cream Place 1 gram vaginally twice weekly 07/29/16   Jerene Bears, MD  estradiol (VIVELLE-DOT) 0.05 MG/24HR patch Place 1 patch (0.05 mg total)  onto the skin 2 (two) times a week. 06/23/16   Ria Comment, FNP  feeding supplement, ENSURE ENLIVE, (ENSURE ENLIVE) LIQD Take 237 mLs by mouth 2 (two) times daily between meals. 05/26/16   Francine Graven Simaan, PA-C  hydrOXYzine (ATARAX/VISTARIL) 25 MG tablet Take 50 tablets by mouth at bedtime.  06/13/15   Historical Provider, MD  iron polysaccharides (NIFEREX) 150 MG capsule Take 150 mg by mouth daily.     Historical Provider, MD  lamoTRIgine (LAMICTAL) 150 MG tablet Take 300 mg by mouth every morning. 02/17/16   Historical Provider, MD  medroxyPROGESTERone (PROVERA) 2.5 MG tablet Take 1 tablet (2.5 mg total) by mouth daily. 06/21/16   Ria Comment, FNP  NONFORMULARY OR COMPOUNDED ITEM Boric acid suppository 200mg , size 0 gelatin capsule,1 pv twice weekly.  With flare use daily pv for 3 days. 06/27/16   Ria Comment, FNP  ondansetron (ZOFRAN-ODT) 4 MG disintegrating tablet Take 1 tablet (4 mg total) by mouth every 6 (six) hours as needed for nausea. 05/26/16   Adam Phenix, PA-C  Prenatal Vit-Fe Fumarate-FA (PRENATAL MULTIVITAMIN) TABS tablet Take 1 tablet by mouth daily at 12 noon.    Historical Provider, MD  Probiotic Product (PROBIOTIC ADVANCED PO) Take 1 tablet by mouth every morning.    Historical Provider, MD  terconazole (TERAZOL 7) 0.4 % vaginal cream Place 1 applicator vaginally at bedtime. 07/22/16   Jerene Bears, MD  traZODone (DESYREL) 100 MG tablet Take 2 tablets (200 mg total) by mouth at bedtime. 05/10/15   Beau Fanny, FNP    Family History Family History  Problem Relation Age of Onset  . Hypertension Mother   . Hyperlipidemia Mother   . Lung cancer Mother   . Thyroid cancer Mother   . COPD Mother   . Lung cancer Father 26  . Heart attack Father 50    3 vessel CABG  . Leukemia Maternal Grandmother   . Stroke Maternal Grandfather   . Stroke Paternal Grandmother   . Heart failure Paternal Grandfather   . Thyroid disease Maternal Aunt   . Osteoarthritis Maternal Aunt      Social History Social History  Substance Use Topics  . Smoking status: Never Smoker  . Smokeless tobacco: Never Used  . Alcohol use 0.0 oz/week     Comment: 1 glass of red wine nightly     Allergies   Ibuprofen; Neomycin-bacitracin-polymyxin  [bacitracin-neomycin-polymyxin]; Quinolones; Bactroban [mupirocin]; Benzalkonium chloride; Cefzil [cefprozil]; Cephalexin; Ciprofloxacin; Levofloxacin; Mederma; Monistat [miconazole]; Neosporin [neomycin-bacitracin zn-polymyx]; Quinine; Septra [sulfamethoxazole-trimethoprim]; Valtrex [valacyclovir hcl]; and Adhesive [tape]   Review of Systems Review of Systems Constitutional: Negative for fever.  Skin: Positive for wound.  Psychiatric/Behavioral: Negative for suicidal ideas.   Physical Exam Updated Vital Signs BP 123/74 (  BP Location: Left Arm)   Pulse (!) 102   Temp 99 F (37.2 C) (Oral)   Resp 18   Ht 5' 4.5" (1.638 m)   Wt 53.5 kg   SpO2 97%   BMI 19.94 kg/m   Physical Exam  Constitutional: She appears well-developed and well-nourished. No distress.  Eyes: Right eye exhibits no discharge. Left eye exhibits no discharge. No scleral icterus.  Pulmonary/Chest: No respiratory distress.  Musculoskeletal: Normal range of motion. She exhibits no edema.  DP pulses 2+ bilaterally. 3 cm laceration noted to the medial aspect of the left lower extremity right above the ankle. Arterial bleed noted but bleeding is controlled with pressure dressing. Sensation intact to sharp/dull. Cap refill normal. Full range of motion of the left ankle and left foot. Full flexion and extension without pain.  Neurological: She is alert.  Skin: Skin is warm and dry. Capillary refill takes less than 2 seconds. No pallor.  3 cm wound as noted in the MSK section.  Nursing note and vitals reviewed.    ED Treatments / Results  Labs (all labs ordered are listed, but only abnormal results are displayed) Labs Reviewed - No data to display  EKG  EKG  Interpretation None       Radiology No results found.  Procedures .Marland KitchenLaceration Repair Date/Time: 08/30/2016 8:09 AM Performed by: Rise Mu Authorized by: Demetrios Loll T   Consent:    Consent obtained:  Verbal   Consent given by:  Patient   Risks discussed:  Infection, need for additional repair, nerve damage, poor wound healing, poor cosmetic result, pain, retained foreign body, tendon damage and vascular damage   Alternatives discussed:  No treatment Anesthesia (see MAR for exact dosages):    Anesthesia method:  Local infiltration   Local anesthetic:  Lidocaine 1% WITH epi Laceration details:    Location:  Leg   Leg location:  L lower leg   Length (cm):  3   Depth (mm):  5 Repair type:    Repair type:  Simple Pre-procedure details:    Preparation:  Patient was prepped and draped in usual sterile fashion and imaging obtained to evaluate for foreign bodies Exploration:    Hemostasis achieved with:  Direct pressure and epinephrine   Wound exploration: wound explored through full range of motion and entire depth of wound probed and visualized     Wound extent: no fascia violation noted, no foreign bodies/material noted, no muscle damage noted, no nerve damage noted and no vascular damage noted     Contaminated: no   Treatment:    Area cleansed with:  Betadine and saline   Amount of cleaning:  Extensive   Irrigation solution:  Sterile water   Irrigation volume:  100   Irrigation method:  Pressure wash   Visualized foreign bodies/material removed: no   Skin repair:    Repair method:  Sutures   Suture size:  5-0   Suture material:  Prolene   Suture technique:  Horizontal mattress   Number of sutures:  7 Approximation:    Approximation:  Close   Vermilion border: well-aligned   Post-procedure details:    Dressing:  Bulky dressing and antibiotic ointment   Patient tolerance of procedure:  Tolerated well, no immediate complications   (including critical  care time)  Medications Ordered in ED Medications  bacitracin ointment ( Topical Not Given 08/30/16 0637)  lidocaine-EPINEPHrine (XYLOCAINE W/EPI) 2 %-1:200000 (PF) injection 20 mL (20 mLs Infiltration Given 08/30/16 0535)  Tdap (BOOSTRIX) injection 0.5 mL (0.5 mLs Intramuscular Given 08/30/16 8675)     Initial Impression / Assessment and Plan / ED Course  I have reviewed the triage vital signs and the nursing notes.  Pertinent labs & imaging results that were available during my care of the patient were reviewed by me and considered in my medical decision making (see chart for details).    Pt presents to ED for laceration of LLE. Wound explored. Tdap booster given. Pressure irrigation performed. Laceration occurred < 8 hours prior to repair which was well tolerated. Did not obtain imaging as low concern for bony involvement and foreign body. The patient with history of self cutting. Denies any SI/HI or auditory/visual hallucinations. Do not feel the patient is a harm to self. Will follow with her psychiatrist this week. Pt has no co morbidities to effect normal wound healing. Discussed suture home care w pt and answered questions. Pt to f-u for wound check and suture removal in 7 days. Pt is hemodynamically stable w no complaints prior to dc.     Final Clinical Impressions(s) / ED Diagnoses   Final diagnoses:  Laceration of left lower extremity, initial encounter    New Prescriptions Discharge Medication List as of 08/30/2016  6:31 AM       Rise Mu, PA-C 08/30/16 0816    April Palumbo, MD 08/30/16 [phone]

## 2016-09-05 NOTE — Telephone Encounter (Signed)
Called patient and rescheduled to 09/09/16 with Dr. Hyacinth Meeker. Routing to provider for FYI only.

## 2016-09-09 ENCOUNTER — Ambulatory Visit (INDEPENDENT_AMBULATORY_CARE_PROVIDER_SITE_OTHER): Payer: BLUE CROSS/BLUE SHIELD | Admitting: Obstetrics & Gynecology

## 2016-09-09 VITALS — BP 100/66 | HR 96 | Resp 16 | Ht 64.5 in | Wt 120.0 lb

## 2016-09-09 DIAGNOSIS — Z79890 Hormone replacement therapy (postmenopausal): Secondary | ICD-10-CM

## 2016-09-09 DIAGNOSIS — L231 Allergic contact dermatitis due to adhesives: Secondary | ICD-10-CM | POA: Diagnosis not present

## 2016-09-09 DIAGNOSIS — N951 Menopausal and female climacteric states: Secondary | ICD-10-CM

## 2016-09-09 MED ORDER — ESTRADIOL-NORETHINDRONE ACET 0.5-0.1 MG PO TABS: 1.0000 | ORAL_TABLET | Freq: Every day | ORAL | 3 refills | Status: DC

## 2016-09-09 NOTE — Progress Notes (Signed)
GYNECOLOGY  VISIT   HPI: 51 y.o. [id number] Married Caucasian female here for follow up after restarting HRT.  Reports she is feeling well from hormonal standpoint.  Hot flashes have improved and sleep is much better.  Mood swings are better as well.    Pt was recently seen in the ER due to laceration along ankle.  She has cutting tendencies and cut herself in response to issues with redoing breast implants.  Right nipple did not initially heal well and was black in places for the first couple of weeks.  She showed me pictures which were impressive.  Reports it is much better now but due to the stressors she felt with the way her breasts initially looked, she started to cut again.  She has been seeing her therapist regularly.  She states this has nothing to do with her hormone replacement therapy--this was all done in response to her stressors.    She does report since the issues with her breast, she's started having reactions to the patches she is using and having erythematous areas underneath the patch each time she takes it off.  Does not ask to change from her current therapy as she is feeling well with it.  However, I suggest considering a change as I don't want skin issues and possible scarring to contribute to future cutting issues.  She agrees.  Will plan to switch to oral dosing.`  GYNECOLOGIC HISTORY: No LMP recorded. Patient has had an ablation. Contraception: PMP Menopausal hormone therapy: vivelle dot, prometium  Patient Active Problem List   Diagnosis Date Noted  . Major laceration of liver with open wound s/p ex lap & repair 05/21/2016 05/23/2016  . Adverse food reaction 01/11/2016  . Mild persistent asthma 01/11/2016  . Chronic rhinitis 01/11/2016  . Non compliance w medication regimen 01/11/2016  . Bipolar disorder, manic (HCC) 05/05/2015  . Fothergill's neuralgia 01/15/2014  . Chronic fatigue syndrome 11/20/2013  . Chronic migraine without aura 11/20/2013  . Rotator cuff  syndrome 07/15/2013  . ANA positive 03/12/2013  . PCB (post coital bleeding) 01/18/2013  . Lichen sclerosus 12/03/2012  . Irritable bowel syndrome 12/03/2012  . Interstitial cystitis 12/03/2012  . Hypertriglyceridemia 10/25/2011  . Allergic rhinitis, seasonal 09/28/2011  . 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 Acne 12/21/2010  . 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 Anxiety, generalized 12/09/2010  . Fibromyalgia 12/09/2010    Past Medical History:  Diagnosis Date  . Abnormal Pap smear of cervix 1997   --hx of conization of cervix by Dr. Roberto Scales  . Anxiety   . Arm sprain 5/11   right   . Broken arm    left arm by elbow  . Fibromyalgia   . Genital warts   . History of self mutilation   . HSV-2 infection    rare occurence  . 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 IBS (irritable bowel syndrome)   . Interstitial cystitis   . Lichen sclerosus    Vulva  . Manic depression (HCC)   . MS (multiple sclerosis) (HCC)     Past Surgical History:  Procedure Laterality Date  . BREAST ENHANCEMENT SURGERY  1990   Saline Implants  . CERVIX LESION DESTRUCTION  1997   Dr. Roberto Scales  . ENDOMETRIAL ABLATION  1997/1998  . LAPAROTOMY N/A 05/21/2016   Procedure: EXPLORATORY LAPAROTOMY, CAUTERIZATION OF LIVER LACERATION, EVACUATION OF HEMOPERITONEUM;  Surgeon: Darnell Level, MD;  Location: [facility];  Service: General;  Laterality: N/A;  . NASAL SINUS  SURGERY    . TONSILLECTOMY      MEDS:  Reviewed in EPIC and UTD  ALLERGIES: Ibuprofen; Neomycin-bacitracin-polymyxin  [bacitracin-neomycin-polymyxin]; Quinolones; Bactroban [mupirocin]; Benzalkonium chloride; Cefzil [cefprozil]; Cephalexin; Ciprofloxacin; Levofloxacin; Mederma; Monistat [miconazole]; Neosporin [neomycin-bacitracin zn-polymyx]; Quinine; Septra [sulfamethoxazole-trimethoprim]; Valtrex [valacyclovir hcl]; and Adhesive [tape]  Family History  Problem Relation Age of Onset  . Hypertension Mother   . Hyperlipidemia Mother   . Lung cancer Mother    . Thyroid cancer Mother   . COPD Mother   . Lung cancer Father 51  . Heart attack Father 50    3 vessel CABG  . Leukemia Maternal Grandmother   . Stroke Maternal Grandfather   . Stroke Paternal Grandmother   . Heart failure Paternal Grandfather   . Thyroid disease Maternal Aunt   . Osteoarthritis Maternal Aunt    SH:  Married, non-smoker  Review of Systems  Gastrointestinal: Positive for diarrhea.  All other systems reviewed and are negative.   PHYSICAL EXAMINATION:    BP 100/66 (BP Location: Right Arm, Patient Position: Sitting, Cuff Size: Normal)   Pulse 96   Resp 16   Wt 120 lb (54.4 kg)   BMI 20.28 kg/m     General appearance: alert, cooperative and appears stated age Breasts: erythema of right areola and dark scab present along far right edge of incision beneath breast, left breast healing well Abdomen: small erythematous rash areas in multiple stages of healing consistent with skin reaction to patches   Assessment: Vasomotor symptoms, much improved Skin reaction to adhesion in patches Recent breast surgery Recent laceration of vessel along ankle due to self cutting H/O bipolar d/o  Plan: Change to Activella HS, generic, dosing 1 tab po q day.  #30/3RF.  Pt will call with update and if feels patch was working better for her.     ~20 minutes spent with patient >50% of time was in face to face discussion of above.

## 2016-09-11 ENCOUNTER — Encounter: Payer: Self-pay | Admitting: Obstetrics & Gynecology

## 2016-09-12 ENCOUNTER — Telehealth: Payer: Self-pay | Admitting: Obstetrics & Gynecology

## 2016-09-12 NOTE — Telephone Encounter (Signed)
Dr. Hyacinth Meeker, ok to send RX for Activella?  Plan: OV dated 09/09/16 Change to Activella HS, generic, dosing 1 tab po q day.  #30/3RF.  Pt will call with update and if feels patch was working better for her.

## 2016-09-12 NOTE — Telephone Encounter (Signed)
Yes, ok to send in.  Thanks.

## 2016-09-12 NOTE — Telephone Encounter (Signed)
Patient calling because her hormone medication is not at the pharmacy.

## 2016-09-12 NOTE — Telephone Encounter (Signed)
Spoke with patient, advised RX for estradiol-norethindrone tab to [facility] on 09/09/16, f/u with pharmacy for filling. Patient verbalizes understanding and is agreeable.  Routing to provider for final review. Patient is agreeable to disposition. Will close encounter.

## 2016-10-26 ENCOUNTER — Encounter: Payer: Self-pay | Admitting: Allergy

## 2016-10-26 ENCOUNTER — Ambulatory Visit (INDEPENDENT_AMBULATORY_CARE_PROVIDER_SITE_OTHER): Payer: BLUE CROSS/BLUE SHIELD | Admitting: Allergy

## 2016-10-26 VITALS — BP 94/62 | HR 93 | Temp 97.6°F | Resp 18 | Ht 64.5 in | Wt 119.4 lb

## 2016-10-26 DIAGNOSIS — J4531 Mild persistent asthma with (acute) exacerbation: Secondary | ICD-10-CM | POA: Diagnosis not present

## 2016-10-26 DIAGNOSIS — J0100 Acute maxillary sinusitis, unspecified: Secondary | ICD-10-CM | POA: Diagnosis not present

## 2016-10-26 MED ORDER — MONTELUKAST SODIUM 10 MG PO TABS: 10.0000 mg | ORAL_TABLET | Freq: Every day | ORAL | 5 refills | Status: DC

## 2016-10-26 MED ORDER — LEVALBUTEROL TARTRATE 45 MCG/ACT IN AERO: 2.0000 | INHALATION_SPRAY | RESPIRATORY_TRACT | 1 refills | Status: DC | PRN

## 2016-10-26 MED ORDER — AMOXICILLIN-POT CLAVULANATE 875-125 MG PO TABS: ORAL_TABLET | ORAL | 0 refills | Status: DC

## 2016-10-26 NOTE — Patient Instructions (Addendum)
Acute sinusitis, maxillary   - symptoms of nasal congestion, sinus pressure/pain with lower respiratory tract symptoms on Amoxicillin.  Stop Amoxcillin at this time and start Augmentin 875mg  twice a day x 10 days.    - take prednisone 20mg  twice a day x 3 days, then 20mg  daily x 2 days then 10mg  daily x 1 day then stop   - recommend nasal saline rinses daily   - continue use of Mucinex D 1 tab twice a day.  Drink with plenty of water.     Mild persistent asthma with acute exacerbation - Use Qvar two puffs twice daily when you have respiratory problems.  - Do this for two weeks to help you through the respiratory infection. - Use Xopenex 2-4 puffs every 4-6 hours as needed. - take singulair 10mg  daily   Asthma control goals:   Full participation in all desired activities (may need albuterol before activity)  Albuterol use two time or less a week on average (not counting use with activity)  Cough interfering with sleep two time or less a month  Oral steroids no more than once a year  No hospitalizations   Follow-up in 3 months or sooner if needed

## 2016-10-26 NOTE — Progress Notes (Signed)
Follow-up Note  RE: Erika Mccoy MRN: [id number] DOB: 04-16-1966 Date of Office Visit: 10/26/2016   History of present illness: Erika Mccoy is a 51 y.o. female presenting today for sick visit.   She reports her ears started popping 2 weeks ago.  She then developed a cough, heavy chest feeling, sinus pressure and pain in her cheeks, nasal congestion that she can not blow out any mucus.  She also reports headache in forehead and over her eyes.  She states she normally never gets headaches.  She reports the cough is relentless throughout the day and she has coughing spells to the point of almost choking.  Symptoms are not improving. She denies any fevers but states she has been cold.  She is taking mucinex twice a day.  She has access to xopenex but states she has not been using this.  She has been on Amoxicillin 500mg  twice a day following dental work now for past week and still has the symptoms as above.   She has a history of mild persistent asthma, chronic rhinitis and adverse food reaction with a history of being noncompliant with her medication regimen. She was last in the office on 01/11/2016 by Dr. Dellis Anes.  She reports she was on Qvar but she stopped using this over the following winter as she did not feel she needed to use this medication because she did not have any asthma symptoms that time. She also stopped taking Singulair this fall as well for the same reasons. She does take Dexilant and Linzess for her history of reflux and IBS.   Review of systems: Review of Systems  Constitutional: Positive for malaise/fatigue. Negative for chills and fever.  HENT: Positive for congestion, ear pain and sinus pain. Negative for ear discharge, hearing loss, nosebleeds, sore throat and tinnitus.   Eyes: Negative for discharge and redness.  Respiratory: Positive for cough, shortness of breath and wheezing. Negative for sputum production.   Cardiovascular: Positive for chest pain (chest  heaviness).  Gastrointestinal: Negative for abdominal pain, heartburn, nausea and vomiting.  Musculoskeletal: Negative for joint pain and myalgias.  Skin: Negative for itching and rash.  Neurological: Positive for headaches. Negative for dizziness.    All other systems negative unless noted above in HPI  Past medical/social/surgical/family history have been reviewed and are unchanged unless specifically indicated below.  No changes  Medication List: Allergies as of 10/26/2016      Reactions   Ibuprofen Other (See Comments)   Per neurologist patient can not take due to it being a bladder irritant.  Per neurologist patient can not take due to it being a bladder irritant.    Neomycin-bacitracin-polymyxin  [bacitracin-neomycin-polymyxin] Other (See Comments), Swelling   Hot inflammation   Quinolones Other (See Comments)   pustules Vasculitis with Levaquin and Cipro   Bactroban [mupirocin] Itching   Benzalkonium Chloride Itching   Cefzil [cefprozil] Hives   Cephalexin Hives   Ciprofloxacin Other (See Comments)   Small vessel vasculitis Small vessel vasculitis Unknown (Vasculitis)   Levofloxacin    Vasculitis   Mederma Other (See Comments)   Sneezing   Monistat [miconazole] Other (See Comments), Itching   burning Reaction unknown   Neosporin [neomycin-bacitracin Zn-polymyx] Swelling   Hot   Quinine Other (See Comments)   Small vasculitis    Septra [sulfamethoxazole-trimethoprim] Hives   Valtrex [valacyclovir Hcl] Other (See Comments)   Vomiting, diarrhea, and abdominal cramping.    Adhesive [tape] Rash, Other (See Comments)   Pulls  skin off      Medication List       Accurate as of 10/26/16  1:03 PM. Always use your most recent med list.          amoxicillin 500 MG capsule Commonly known as:  AMOXIL TK 1 C PO BID   amoxicillin-clavulanate 875-125 MG tablet Commonly known as:  AUGMENTIN 1 tablet twice a day for 10 days.   BIOTIN 5000 PO Take 5,000 mcg by  mouth daily.   buPROPion 300 MG 24 hr tablet Commonly known as:  WELLBUTRIN XL Take 300 mg by mouth every morning.   clonazePAM 1 MG tablet Commonly known as:  KLONOPIN Take 1 tablet (1 mg total) by mouth 2 (two) times daily.   D3-1000 1000 units capsule Generic drug:  Cholecalciferol Take 5,000 Units by mouth daily.   dexlansoprazole 60 MG capsule Commonly known as:  DEXILANT Take 60 mg by mouth daily.   Estradiol-Norethindrone Acet 0.5-0.1 MG tablet Take 1 tablet by mouth daily.   feeding supplement (ENSURE ENLIVE) Liqd Take 237 mLs by mouth 2 (two) times daily between meals.   hydrOXYzine 25 MG tablet Commonly known as:  ATARAX/VISTARIL Take 50 tablets by mouth at bedtime.   iron polysaccharides 150 MG capsule Commonly known as:  NIFEREX Take 150 mg by mouth daily.   lamoTRIgine 150 MG tablet Commonly known as:  LAMICTAL Take 300 mg by mouth every morning.   levalbuterol 45 MCG/ACT inhaler Commonly known as:  XOPENEX HFA Inhale 2 puffs into the lungs every 4 (four) hours as needed for wheezing.   LINZESS 290 MCG Caps capsule Generic drug:  linaclotide Take 290 mcg by mouth daily.   metroNIDAZOLE 0.75 % gel Commonly known as:  METROGEL APPLY 1 APPLICATION ONTO THE SKIN DAILY   montelukast 10 MG tablet Commonly known as:  SINGULAIR Take 1 tablet (10 mg total) by mouth at bedtime.   prenatal multivitamin Tabs tablet Take 1 tablet by mouth daily at 12 noon.   traZODone 100 MG tablet Commonly known as:  DESYREL Take 2 tablets (200 mg total) by mouth at bedtime.       Known medication allergies: Allergies  Allergen Reactions  . Ibuprofen Other (See Comments)    Per neurologist patient can not take due to it being a bladder irritant.  Per neurologist patient can not take due to it being a bladder irritant.   . Neomycin-Bacitracin-Polymyxin  [Bacitracin-Neomycin-Polymyxin] Other (See Comments) and Swelling    Hot inflammation  . Quinolones Other (See  Comments)    pustules Vasculitis with Levaquin and Cipro  . Bactroban [Mupirocin] Itching  . Benzalkonium Chloride Itching  . Cefzil [Cefprozil] Hives  . Cephalexin Hives  . Ciprofloxacin Other (See Comments)    Small vessel vasculitis Small vessel vasculitis Unknown (Vasculitis)  . Levofloxacin     Vasculitis  . Mederma Other (See Comments)    Sneezing  . Monistat [Miconazole] Other (See Comments) and Itching    burning Reaction unknown  . Neosporin [Neomycin-Bacitracin Zn-Polymyx] Swelling    Hot  . Quinine Other (See Comments)    Small vasculitis   . Septra [Sulfamethoxazole-Trimethoprim] Hives  . Valtrex [Valacyclovir Hcl] Other (See Comments)    Vomiting, diarrhea, and abdominal cramping.   . Adhesive [Tape] Rash and Other (See Comments)    Pulls skin off     Physical examination: Blood pressure 94/62, pulse 93, temperature 97.6 F (36.4 C), temperature source Oral, resp. rate 18, height 5' 4.5" (1.638 m), weight  119 lb 6.4 oz (54.2 kg), SpO2 97 %.  General: Alert, interactive, in no acute distress. HEENT: PERRLA, TMs pearly gray, turbinates moderately edematous with clear discharge, post-pharynx non erythematous. TTP over maxillary sinus bilaterally Neck: Supple without lymphadenopathy. Lungs: Clear to auscultation without wheezing, rhonchi or rales. {no increased work of breathing. CV: Normal S1, S2 without murmurs. Abdomen: Nondistended, nontender. Skin: Warm and dry, without lesions or rashes. Extremities:  No clubbing, cyanosis or edema. Neuro:   Grossly intact.  Diagnositics/Labs: Spirometry: FEV1: 2.46L  92%, FVC: 2.94L  90%, ratio consistent with Nonobstructive pattern  Assessment and plan:   Acute sinusitis, maxillary   - symptoms of nasal congestion, sinus pressure/pain with lower respiratory tract symptoms on Amoxicillin.  Stop Amoxcillin at this time and start Augmentin 875mg  twice a day x 10 days.    - take prednisone 20mg  twice a day x 3 days,  then 20mg  daily x 2 days then 10mg  daily x 1 day then stop   - recommend nasal saline rinses daily   - continue use of Mucinex D 1 tab twice a day.  Drink with plenty of water.     Mild persistent asthma with acute exacerbation - Use Qvar two puffs twice daily when you have respiratory problems.  - Do this for two weeks to help you through the respiratory infection. - Use Xopenex 2-4 puffs every 4-6 hours as needed. - take singulair 10mg  daily  - Prednisone as above  Asthma control goals:   Full participation in all desired activities (may need albuterol before activity)  Albuterol use two time or less a week on average (not counting use with activity)  Cough interfering with sleep two time or less a month  Oral steroids no more than once a year  No hospitalizations   Follow-up in 3 months or sooner if needed    No Follow-up on file.  I appreciate the opportunity to take part in Erika Mccoy's care. Please do not hesitate to contact me with questions.  Sincerely,   Margo Aye, MD Allergy/Immunology [facility]

## 2016-10-27 ENCOUNTER — Telehealth: Payer: Self-pay | Admitting: Allergy & Immunology

## 2016-10-27 MED ORDER — BECLOMETHASONE DIPROP HFA 80 MCG/ACT IN AERB: 2.0000 | INHALATION_SPRAY | Freq: Two times a day (BID) | RESPIRATORY_TRACT | 0 refills | Status: DC

## 2016-10-27 NOTE — Telephone Encounter (Signed)
Pt called and said we gave her a red inhaler yesterday and she has lost it and would like for [place] to call in one to [facility] [address] . [phone]

## 2016-10-27 NOTE — Telephone Encounter (Signed)
Called patient. To inform her that we sent in Qvar redihaler 80 to the [facility] on [address] per Dr. Delorse Lek.

## 2016-11-09 ENCOUNTER — Other Ambulatory Visit: Payer: Self-pay | Admitting: Obstetrics & Gynecology

## 2016-11-09 DIAGNOSIS — Z1231 Encounter for screening mammogram for malignant neoplasm of breast: Secondary | ICD-10-CM

## 2016-11-14 ENCOUNTER — Ambulatory Visit (HOSPITAL_COMMUNITY): Admission: EM | Admit: 2016-11-14 | Discharge: 2016-11-14 | Discharge status: Absent without leave | Payer: Self-pay

## 2016-11-20 ENCOUNTER — Encounter (HOSPITAL_COMMUNITY): Payer: Self-pay

## 2016-11-20 ENCOUNTER — Emergency Department (HOSPITAL_COMMUNITY)
Admission: EM | Admit: 2016-11-20 | Discharge: 2016-11-20 | Disposition: A | Discharge status: Home / Self Care | Payer: BLUE CROSS/BLUE SHIELD | Attending: Emergency Medicine | Admitting: Emergency Medicine

## 2016-11-20 DIAGNOSIS — S81812A Laceration without foreign body, left lower leg, initial encounter: Secondary | ICD-10-CM | POA: Diagnosis not present

## 2016-11-20 DIAGNOSIS — Y929 Unspecified place or not applicable: Secondary | ICD-10-CM | POA: Insufficient documentation

## 2016-11-20 DIAGNOSIS — Y9389 Activity, other specified: Secondary | ICD-10-CM | POA: Diagnosis not present

## 2016-11-20 DIAGNOSIS — Z79899 Other long term (current) drug therapy: Secondary | ICD-10-CM | POA: Diagnosis not present

## 2016-11-20 DIAGNOSIS — X781XXA Intentional self-harm by knife, initial encounter: Secondary | ICD-10-CM | POA: Diagnosis not present

## 2016-11-20 DIAGNOSIS — Y999 Unspecified external cause status: Secondary | ICD-10-CM | POA: Insufficient documentation

## 2016-11-20 DIAGNOSIS — S8992XA Unspecified injury of left lower leg, initial encounter: Secondary | ICD-10-CM | POA: Diagnosis present

## 2016-11-20 NOTE — Discharge Instructions (Signed)
You may remove the bandage after 24 hours. Clean the wound and surrounding area gently with tap water and mild soap. Rinse well and blot dry. Do not scrub the wound, as this may cause the wound edges to come apart. You may shower, but avoid submerging the wound, such as with a bath or swimming. Clean the wound daily to prevent infection. Do not use cleaners such as hydrogen peroxide or alcohol.   Scar reduction: Application of a topical antibiotic ointment, such as Neosporin, after the wound has begun to close and heal well can decrease scab formation and reduce scarring. After the wound has healed and wound closures have been removed, application of ointments such as Aquaphor can also reduce scar formation.  The key to scar reduction is keeping the skin well hydrated and supple. Drinking plenty of water throughout the day (At least eight 8oz glasses of water a day) is essential to staying well hydrated.  Sun exposure: Keep the wound out of the sun. After the wound has healed, continue to protect it from the sun by wearing protective clothing or applying sunscreen.  Pain: You may use Tylenol for pain.  Suture/staple removal: Return to the ED in 8-12 days for suture removal.  Return to the ED sooner should the wound edges come apart or signs of infection arise, such as spreading redness, puffiness/swelling, pus draining from the wound, severe increase in pain, fever over 100.52F, or any other major issues.

## 2016-11-20 NOTE — ED Notes (Signed)
Per Dr. Juleen China, ok for fast track eval

## 2016-11-20 NOTE — ED Triage Notes (Signed)
Pt here with laceration to left lower limb.  Pt is a cutter when she is stressed.  Has hx of same.  Pt took knife to left ankle.  Pt under stress with her mom.  Pt strongly states that she is not suicidal.  States pain relieves her stress.  Sees regular psychiatry.  Next appt next week. Takes all her meds.

## 2016-11-20 NOTE — ED Provider Notes (Signed)
[facility]-EMERGENCY DEPT Provider Note   [name]: [id number] Arrival date & time: 11/20/16  1523  By signing my name below, I, Erika Mccoy, attest that this documentation has been prepared under the direction and in the presence of non-physician practitioner, Priya Matsen C., PA-C. Electronically Signed: Rosana Mccoy, ED Scribe. 11/20/16. 6:14 PM.  History   Chief Complaint Chief Complaint  Patient presents with  . Extremity Laceration   The history is provided by the patient. No language interpreter was used.   HPI Comments: Erika Mccoy is a 51 y.o. female who presents to the Emergency Department complaining of a mild laceration to the left lower leg onset 2 hours ago. Pt reports that the cut was self-inflicted and that she has a hx of similar actions due to stress. She states she used a clean knife for the cut. Pt sees a psychiatrist and takes her medications regularly. She states she has reported the incident to her psychiatrist. She states, "I've been under an increased amount of stress due to problems with my aging mother. When I get stressed, sometimes I cut myself and that relieves the stress." Patient adamantly denies SI/HI. Denies A/V hallucinations. Denies alcohol or illicit drug use.  Tetanus is UTD. Pt denies neuro deficits, other injuries, or any other complaints.  Past Medical History:  Diagnosis Date  . Abnormal Pap smear of cervix 1997   --hx of conization of cervix by Dr. Roberto Scales  . Anxiety   . Arm sprain 5/11   right   . Broken arm    left arm by elbow  . Fibromyalgia   . Genital warts   . History of self mutilation   . HSV-2 infection    rare occurence  . 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 IBS (irritable bowel syndrome)   . Interstitial cystitis   . Lichen sclerosus    Vulva  . Manic depression (HCC)   . MS (multiple sclerosis) (HCC)     Patient Active Problem List   Diagnosis Date Noted  . Major laceration of liver with open wound s/p ex lap &  repair 05/21/2016 05/23/2016  . Adverse food reaction 01/11/2016  . Mild persistent asthma 01/11/2016  . Chronic rhinitis 01/11/2016  . Non compliance w medication regimen 01/11/2016  . Fothergill's neuralgia 01/15/2014  . Chronic fatigue syndrome 11/20/2013  . Chronic migraine without aura 11/20/2013  . Rotator cuff syndrome 07/15/2013  . ANA positive 03/12/2013  . PCB (post coital bleeding) 01/18/2013  . Lichen sclerosus 12/03/2012  . Irritable bowel syndrome 12/03/2012  . Interstitial cystitis 12/03/2012  . Hypertriglyceridemia 10/25/2011  . Allergic rhinitis, seasonal 09/28/2011  . 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 Acne 12/21/2010  . 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 Anxiety, generalized 12/09/2010  . Fibromyalgia 12/09/2010  . Bipolar 1 disorder (HCC) 12/09/2010    Past Surgical History:  Procedure Laterality Date  . BREAST ENHANCEMENT SURGERY  1990   Saline Implants  . CERVIX LESION DESTRUCTION  1997   Dr. Roberto Scales  . ENDOMETRIAL ABLATION  1997/1998  . LAPAROTOMY N/A 05/21/2016   Procedure: EXPLORATORY LAPAROTOMY, CAUTERIZATION OF LIVER LACERATION, EVACUATION OF HEMOPERITONEUM;  Surgeon: Darnell Level, MD;  Location: [facility] ORS;  Service: General;  Laterality: N/A;  . NASAL SINUS SURGERY    . TONSILLECTOMY      OB History    Gravida Para Term Preterm AB Living   2 2 2  0 0 3   SAB TAB Ectopic Multiple Live Births   0  0 0 1 3       Home Medications    Prior to Admission medications   Medication Sig Start Date End Date Taking? Authorizing Provider  amoxicillin (AMOXIL) 500 MG capsule TK 1 C PO BID 10/18/16   [provider]  amoxicillin-clavulanate (AUGMENTIN) 875-125 MG tablet 1 tablet twice a day for 10 days. 10/26/16   Marcelyn Bruins, MD  beclomethasone (QVAR REDIHALER) 80 MCG/ACT inhaler Inhale 2 puffs into the lungs 2 (two) times daily. 10/27/16   Marcelyn Bruins, MD  BIOTIN 5000 PO Take 5,000 mcg by mouth daily.     [provider]    buPROPion (WELLBUTRIN XL) 300 MG 24 hr tablet Take 300 mg by mouth every morning.  01/10/16   [provider]  Cholecalciferol (D3-1000) 1000 units capsule Take 5,000 Units by mouth daily.    [provider]  clonazePAM (KLONOPIN) 1 MG tablet Take 1 tablet (1 mg total) by mouth 2 (two) times daily. Patient taking differently: Take 1 mg by mouth 3 (three) times daily as needed for anxiety.  05/10/15   Withrow, Everardo All, FNP  dexlansoprazole (DEXILANT) 60 MG capsule Take 60 mg by mouth daily.    [provider]  Estradiol-Norethindrone Acet 0.5-0.1 MG tablet Take 1 tablet by mouth daily. 09/09/16   Jerene Bears, MD  feeding supplement, ENSURE ENLIVE, (ENSURE ENLIVE) LIQD Take 237 mLs by mouth 2 (two) times daily between meals. 05/26/16   Adam Phenix, PA-C  hydrOXYzine (ATARAX/VISTARIL) 25 MG tablet Take 50 tablets by mouth at bedtime.  06/13/15   [provider]  iron polysaccharides (NIFEREX) 150 MG capsule Take 150 mg by mouth daily.     [provider]  lamoTRIgine (LAMICTAL) 150 MG tablet Take 300 mg by mouth every morning. 02/17/16   [provider]  levalbuterol Pauline Aus HFA) 45 MCG/ACT inhaler Inhale 2 puffs into the lungs every 4 (four) hours as needed for wheezing. 10/26/16   Marcelyn Bruins, MD  linaclotide Karlene Einstein) 290 MCG CAPS capsule Take 290 mcg by mouth daily.    [provider]  metroNIDAZOLE (METROGEL) 0.75 % gel APPLY 1 APPLICATION ONTO THE SKIN DAILY 09/09/16   [provider]  montelukast (SINGULAIR) 10 MG tablet Take 1 tablet (10 mg total) by mouth at bedtime. 10/26/16   Marcelyn Bruins, MD  Prenatal Vit-Fe Fumarate-FA (PRENATAL MULTIVITAMIN) TABS tablet Take 1 tablet by mouth daily at 12 noon.    [provider]  traZODone (DESYREL) 100 MG tablet Take 2 tablets (200 mg total) by mouth at bedtime. Patient taking differently: Take 100 mg by mouth at bedtime.  05/10/15   Withrow,  Everardo All, FNP    Family History Family History  Problem Relation Age of Onset  . Hypertension Mother   . Hyperlipidemia Mother   . Lung cancer Mother   . Thyroid cancer Mother   . COPD Mother   . Lung cancer Father 20  . Heart attack Father 50       3 vessel CABG  . Leukemia Maternal Grandmother   . Stroke Maternal Grandfather   . Stroke Paternal Grandmother   . Heart failure Paternal Grandfather   . Thyroid disease Maternal Aunt   . Osteoarthritis Maternal Aunt     Social History Social History  Substance Use Topics  . Smoking status: Never Smoker  . Smokeless tobacco: Never Used  . Alcohol use 0.0 oz/week     Comment: 1 glass of red  wine nightly     Allergies   Ibuprofen; Neomycin-bacitracin-polymyxin  [bacitracin-neomycin-polymyxin]; Quinolones; Bactroban [mupirocin]; Benzalkonium chloride; Cefzil [cefprozil]; Cephalexin; Ciprofloxacin; Levofloxacin; Mederma; Monistat [miconazole]; Neosporin [neomycin-bacitracin zn-polymyx]; Quinine; Septra [sulfamethoxazole-trimethoprim]; Valtrex [valacyclovir hcl]; and Adhesive [tape]   Review of Systems Review of Systems  Skin: Positive for wound.  Neurological: Negative for weakness and numbness.  Psychiatric/Behavioral: Negative for hallucinations and suicidal ideas. The patient is not nervous/anxious.      Physical Exam Updated Vital Signs BP (!) 132/97 (BP Location: Right Arm)   Pulse 74   Temp 98.6 F (37 C) (Oral)   Resp 14   SpO2 100%   Physical Exam  Constitutional: She appears well-developed and well-nourished. No distress.  HENT:  Head: Normocephalic and atraumatic.  Eyes: Conjunctivae are normal.  Neck: Neck supple.  Cardiovascular: Normal rate, regular rhythm and intact distal pulses.   Pulmonary/Chest: Effort normal.  Neurological: She is alert.   No sensory deficits. Plantar and dorsiflexion of the left foot with 5 out of 5 strength. Patient is ambulatory without gait deficit.  Skin: Skin is warm and  dry. Capillary refill takes less than 2 seconds. She is not diaphoretic.  2 cm linear laceration to the left medial lower leg. Distal pulses are intact.  Psychiatric: She has a normal mood and affect. Her speech is normal and behavior is normal. She expresses no homicidal and no suicidal ideation.  Patient makes good eye contact and is engaging.  Nursing note and vitals reviewed.    ED Treatments / Results  DIAGNOSTIC STUDIES: Oxygen Saturation is 98% on RA, normal by my interpretation.   COORDINATION OF CARE: 5:11 PM-Discussed next steps with pt including laceration repair. Pt verbalized understanding and is agreeable with the plan.   Labs (all labs ordered are listed, but only abnormal results are displayed) Labs Reviewed - No data to display  EKG  EKG Interpretation None       Radiology No results found.  Procedures .Marland KitchenLaceration Repair Date/Time: 11/20/2016 5:12 PM Performed by: Anselm Pancoast Authorized by: Anselm Pancoast   Consent:    Consent obtained:  Verbal   Consent given by:  Patient   Risks discussed:  Infection and pain Anesthesia (see MAR for exact dosages):    Anesthesia method:  Local infiltration   Local anesthetic:  Lidocaine 2% WITH epi Laceration details:    Location:  Leg   Leg location:  L lower leg   Length (cm):  2 Repair type:    Repair type:  Simple Pre-procedure details:    Preparation:  Patient was prepped and draped in usual sterile fashion Exploration:    Hemostasis achieved with:  Direct pressure   Wound exploration: wound explored through full range of motion and entire depth of wound probed and visualized   Treatment:    Area cleansed with:  Betadine   Amount of cleaning:  Standard   Irrigation solution:  Sterile saline   Irrigation method:  Syringe Skin repair:    Repair method:  Sutures   Suture size:  4-0   Suture material:  Prolene   Suture technique:  Horizontal mattress   Number of sutures:  3 Approximation:     Approximation:  Close Post-procedure details:    Dressing:  Sterile dressing   Patient tolerance of procedure:  Tolerated well, no immediate complications     (including critical care time)  Medications Ordered in ED Medications - No data to display   Initial Impression / Assessment and Plan /  ED Course  I have reviewed the triage vital signs and the nursing notes.  Pertinent labs & imaging results that were available during my care of the patient were reviewed by me and considered in my medical decision making (see chart for details).     Patient presents with a self-inflicted laceration to the lower leg. Patient has a history of this type of behavior. She voices regular compliance with her medications and psychiatry evaluations. She adamantly denies SI. She has a plan for psychiatry follow-up. Wound care, further follow-up, and return instructions discussed. Patient voices understanding of all instructions and is comfortable with discharge.    Final Clinical Impressions(s) / ED Diagnoses   Final diagnoses:  Laceration of left lower extremity, initial encounter    New Prescriptions Discharge Medication List as of 11/20/2016  6:16 PM     I personally performed the services described in this documentation, which was scribed in my presence. The recorded information has been reviewed and is accurate.    Anselm Pancoast, PA-C 11/22/16 1112    Little, Ambrose Finland, MD 11/23/16 1622

## 2016-11-30 ENCOUNTER — Emergency Department (HOSPITAL_COMMUNITY)
Admission: EM | Admit: 2016-11-30 | Discharge: 2016-11-30 | Disposition: A | Discharge status: Home / Self Care | Payer: BLUE CROSS/BLUE SHIELD | Attending: Emergency Medicine | Admitting: Emergency Medicine

## 2016-11-30 ENCOUNTER — Encounter (HOSPITAL_COMMUNITY): Payer: Self-pay | Admitting: *Deleted

## 2016-11-30 DIAGNOSIS — S91012A Laceration without foreign body, left ankle, initial encounter: Secondary | ICD-10-CM | POA: Insufficient documentation

## 2016-11-30 DIAGNOSIS — X789XXA Intentional self-harm by unspecified sharp object, initial encounter: Secondary | ICD-10-CM | POA: Diagnosis not present

## 2016-11-30 DIAGNOSIS — Y9389 Activity, other specified: Secondary | ICD-10-CM | POA: Diagnosis not present

## 2016-11-30 DIAGNOSIS — Y999 Unspecified external cause status: Secondary | ICD-10-CM | POA: Diagnosis not present

## 2016-11-30 DIAGNOSIS — Z79899 Other long term (current) drug therapy: Secondary | ICD-10-CM | POA: Diagnosis not present

## 2016-11-30 DIAGNOSIS — J4520 Mild intermittent asthma, uncomplicated: Secondary | ICD-10-CM | POA: Insufficient documentation

## 2016-11-30 DIAGNOSIS — Y929 Unspecified place or not applicable: Secondary | ICD-10-CM | POA: Diagnosis not present

## 2016-11-30 MED ORDER — LIDOCAINE-EPINEPHRINE (PF) 2 %-1:200000 IJ SOLN
10.0000 mL | Freq: Once | INTRAMUSCULAR | Status: AC
  Administered 2016-11-30: 10 mL via INTRADERMAL
  Filled 2016-11-30: qty 20

## 2016-11-30 NOTE — Discharge Instructions (Signed)
Wound care at home. Follow up for suture removal in 7-10 days.

## 2016-11-30 NOTE — ED Triage Notes (Signed)
Pt presents with a self-inflicted lac to L medial ankle d/t stress.  Old lac noted in same area from previous cutting. Pt reports her therapist is aware that she is self-cutting and have given her alternatives prior to doing this without relief.  Pt is A&Ox 4.  Pt's family at bedside.

## 2016-11-30 NOTE — ED Provider Notes (Signed)
[facility]-EMERGENCY DEPT Provider Note   [name]: [id number] Arrival date & time: 11/30/16  1653  By signing my name below, I, Cynda Acres, attest that this documentation has been prepared under the direction and in the presence of Azaiah Licciardi, PA-C. Electronically Signed: Cynda Acres, Scribe. 11/30/16. 5:49 PM.  History   Chief Complaint No chief complaint on file.  HPI Comments: Erika Mccoy is a 51 y.o. female with a history of self-inflicting cutting and manic depression, who presents to the Emergency Department complaining of a laceration to the left lower leg, which occurred at 4 pm today. Patient states that the cut was self-inflicted to the right lower leg. Patient states she has a history of cutting herself for 15 years due to stress. Patient states that both her therapist and psychiatrist are "okay" with cutting herself, but do provide her "tools". Patient states she has no consideration of any suicidal attempt. Patient states cutting herself helps her feel better. Patient states the cause for this incident was due to taking prednisone. Patient denies any SI, HI, A/V, hallucinations or any additional symptoms. Patient denies any illicit drug or alcohol use.   The history is provided by the patient. No language interpreter was used.    Past Medical History:  Diagnosis Date  . Abnormal Pap smear of cervix 1997   --hx of conization of cervix by Dr. Roberto Scales  . Anxiety   . Arm sprain 5/11   right   . Broken arm    left arm by elbow  . Fibromyalgia   . Genital warts   . History of self mutilation   . HSV-2 infection    rare occurence  . 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 IBS (irritable bowel syndrome)   . Interstitial cystitis   . Lichen sclerosus    Vulva  . Manic depression (HCC)   . MS (multiple sclerosis) (HCC)     Patient Active Problem List   Diagnosis Date Noted  . Major laceration of liver with open wound s/p ex lap & repair 05/21/2016 05/23/2016  .  Adverse food reaction 01/11/2016  . Mild persistent asthma 01/11/2016  . Chronic rhinitis 01/11/2016  . Non compliance w medication regimen 01/11/2016  . Fothergill's neuralgia 01/15/2014  . Chronic fatigue syndrome 11/20/2013  . Chronic migraine without aura 11/20/2013  . Rotator cuff syndrome 07/15/2013  . ANA positive 03/12/2013  . PCB (post coital bleeding) 01/18/2013  . Lichen sclerosus 12/03/2012  . Irritable bowel syndrome 12/03/2012  . Interstitial cystitis 12/03/2012  . Hypertriglyceridemia 10/25/2011  . Allergic rhinitis, seasonal 09/28/2011  . 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 Acne 12/21/2010  . 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 Anxiety, generalized 12/09/2010  . Fibromyalgia 12/09/2010  . Bipolar 1 disorder (HCC) 12/09/2010    Past Surgical History:  Procedure Laterality Date  . BREAST ENHANCEMENT SURGERY  1990   Saline Implants  . CERVIX LESION DESTRUCTION  1997   Dr. Roberto Scales  . ENDOMETRIAL ABLATION  1997/1998  . LAPAROTOMY N/A 05/21/2016   Procedure: EXPLORATORY LAPAROTOMY, CAUTERIZATION OF LIVER LACERATION, EVACUATION OF HEMOPERITONEUM;  Surgeon: Darnell Level, MD;  Location: [facility];  Service: General;  Laterality: N/A;  . NASAL SINUS SURGERY    . TONSILLECTOMY      OB History    Gravida Para Term Preterm AB Living   2 2 2  0 0 3   SAB TAB Ectopic Multiple Live Births   0 0 0 1 3  Home Medications    Prior to Admission medications   Medication Sig Start Date End Date Taking? Authorizing Provider  acetaminophen (TYLENOL) 500 MG tablet Take 500 mg by mouth every 6 (six) hours as needed.   Yes [provider]  amoxicillin (AMOXIL) 500 MG capsule take 500mg  by mouth three times daily 10/18/16  Yes [provider]  amoxicillin-clavulanate (AUGMENTIN) 875-125 MG tablet 1 tablet twice a day for 10 days. 10/26/16  Yes Marcelyn Bruins, MD  buPROPion (WELLBUTRIN XL) 300 MG 24 hr tablet Take 300 mg by mouth every morning.  01/10/16  Yes  [provider]  chlorhexidine (PERIDEX) 0.12 % solution Use as directed 15 mLs in the mouth or throat 2 (two) times daily.   Yes [provider]  Cholecalciferol (D3-1000) 1000 units capsule Take 5,000 Units by mouth daily.   Yes [provider]  clonazePAM (KLONOPIN) 1 MG tablet Take 1 tablet (1 mg total) by mouth 2 (two) times daily. Patient taking differently: Take 1 mg by mouth 3 (three) times daily as needed for anxiety.  05/10/15  Yes Withrow, Everardo All, FNP  dexamethasone (DECADRON) 4 MG tablet Take 4 mg by mouth 3 (three) times daily.   Yes [provider]  dexlansoprazole (DEXILANT) 60 MG capsule Take 60 mg by mouth daily.   Yes [provider]  hydrOXYzine (ATARAX/VISTARIL) 25 MG tablet Take 50 tablets by mouth at bedtime.  06/13/15  Yes [provider]  iron polysaccharides (NIFEREX) 150 MG capsule Take 150 mg by mouth daily.    Yes [provider]  lamoTRIgine (LAMICTAL) 150 MG tablet Take 300 mg by mouth every morning. 02/17/16  Yes [provider]  linaclotide (LINZESS) 290 MCG CAPS capsule Take 290 mcg by mouth daily.   Yes [provider]  traZODone (DESYREL) 100 MG tablet Take 2 tablets (200 mg total) by mouth at bedtime. Patient taking differently: Take 100 mg by mouth at bedtime.  05/10/15  Yes Withrow, Everardo All, FNP  beclomethasone (QVAR REDIHALER) 80 MCG/ACT inhaler Inhale 2 puffs into the lungs 2 (two) times daily. Patient not taking: Reported on 11/30/2016 10/27/16   Marcelyn Bruins, MD  Estradiol-Norethindrone Acet 0.5-0.1 MG tablet Take 1 tablet by mouth daily. Patient not taking: Reported on 11/30/2016 09/09/16   Jerene Bears, MD  feeding supplement, ENSURE ENLIVE, (ENSURE ENLIVE) LIQD Take 237 mLs by mouth 2 (two) times daily between meals. Patient not taking: Reported on 11/30/2016 05/26/16   Adam Phenix, PA-C  levalbuterol [facility] HFA) 45 MCG/ACT inhaler Inhale 2 puffs into the  lungs every 4 (four) hours as needed for wheezing. Patient not taking: Reported on 11/30/2016 10/26/16   Marcelyn Bruins, MD  montelukast (SINGULAIR) 10 MG tablet Take 1 tablet (10 mg total) by mouth at bedtime. Patient not taking: Reported on 11/30/2016 10/26/16   Marcelyn Bruins, MD    Family History Family History  Problem Relation Age of Onset  . Hypertension Mother   . Hyperlipidemia Mother   . Lung cancer Mother   . Thyroid cancer Mother   . COPD Mother   . Lung cancer Father 80  . Heart attack Father 50       3 vessel CABG  . Leukemia Maternal Grandmother   . Stroke Maternal Grandfather   . Stroke Paternal Grandmother   . Heart failure Paternal Grandfather   . Thyroid disease Maternal Aunt   . Osteoarthritis Maternal Aunt     Social History Social  History  Substance Use Topics  . Smoking status: Never Smoker  . Smokeless tobacco: Never Used  . Alcohol use 0.0 oz/week     Comment: 1 glass of red wine nightly     Allergies   Ibuprofen; Neomycin-bacitracin-polymyxin  [bacitracin-neomycin-polymyxin]; Quinolones; Bactroban [mupirocin]; Benzalkonium chloride; Cefzil [cefprozil]; Cephalexin; Ciprofloxacin; Levofloxacin; Mederma; Monistat [miconazole]; Neosporin [neomycin-bacitracin zn-polymyx]; Prednisone; Quinine; Septra [sulfamethoxazole-trimethoprim]; Valtrex [valacyclovir hcl]; and Adhesive [tape]   Review of Systems Review of Systems  Constitutional: Negative for chills and fever.  Skin: Positive for wound (laceration to the left ankle).  Psychiatric/Behavioral: Negative for hallucinations, self-injury and suicidal ideas.     Physical Exam Updated Vital Signs BP 132/85 (BP Location: Left Arm)   Pulse (!) 130   Temp 98 F (36.7 C) (Oral)   Resp 20   SpO2 96%   Physical Exam  Constitutional: She is oriented to person, place, and time. She appears well-developed and well-nourished.  HENT:  Head: Normocephalic and atraumatic.  Eyes: Pupils  are equal, round, and reactive to light. EOM are normal.  Neck: Normal range of motion. Neck supple.  Cardiovascular: Normal rate and regular rhythm.   Pulmonary/Chest: Effort normal and breath sounds normal.  Musculoskeletal: Normal range of motion.  4cm gaping laceration to the medial left ankle, does not penetrate through fatty tissue or fascia. Hemostatic. Cap refill <2sec distally. Full rom of the anke and all toes.   Neurological: She is alert and oriented to person, place, and time.  Skin: Skin is warm and dry.  Psychiatric: She has a normal mood and affect.  Denies SI or HI.   Nursing note and vitals reviewed.    ED Treatments / Results  DIAGNOSTIC STUDIES: Oxygen Saturation is 96% on RA, normal by my interpretation.    COORDINATION OF CARE: 5:49 PM Discussed treatment plan with pt at bedside and pt agreed to plan, which includes a laceration repair.   Labs (all labs ordered are listed, but only abnormal results are displayed) Labs Reviewed - No data to display  EKG  EKG Interpretation None       Radiology No results found.  Procedures .Marland KitchenLaceration Repair Date/Time: 11/30/2016 6:22 PM Performed by: Jaynie Crumble Authorized by: Alvira Monday   Consent:    Consent obtained:  Verbal   Consent given by:  Patient   Risks discussed:  Infection and pain   Alternatives discussed:  No treatment Anesthesia (see MAR for exact dosages):    Anesthesia method:  Local infiltration   Local anesthetic:  Lidocaine 2% WITH epi Laceration details:    Location:  Leg   Leg location:  L lower leg   Length (cm):  4 Repair type:    Repair type:  Simple Pre-procedure details:    Preparation:  Patient was prepped and draped in usual sterile fashion Treatment:    Area cleansed with:  Betadine   Amount of cleaning:  Standard   Irrigation solution:  Sterile saline   Irrigation method:  Syringe   Visualized foreign bodies/material removed: no   Skin repair:    Repair  method:  Sutures   Suture size:  5-0   Suture material:  Prolene   Suture technique:  Simple interrupted   Number of sutures:  5 Approximation:    Approximation:  Loose   Vermilion border: well-aligned   Post-procedure details:    Dressing:  Open (no dressing)   Patient tolerance of procedure:  Tolerated well, no immediate complications   (including critical care time)  Medications  Ordered in ED Medications - No data to display   Initial Impression / Assessment and Plan / ED Course  I have reviewed the triage vital signs and the nursing notes.  Pertinent labs & imaging results that were available during my care of the patient were reviewed by me and considered in my medical decision making (see chart for details).     Pt with self inflicted lac to the medial left ankle. Hx of the same, denies SI or HI. States it makes her "feel something" and helps to relief her stress. Laceration is superficial, however required sutures. Tdap up to date. Home with pcp followup. Discussed following up with psychiatrist, discussed suicide line. Follow up for suture removal. Watch for sings of infection.   Vitals:   11/30/16 1703 11/30/16 1847  BP: 132/85 107/86  Pulse: (!) 130 99  Resp: 20 16  Temp: 98 F (36.7 C)      Final Clinical Impressions(s) / ED Diagnoses   Final diagnoses:  Laceration of left ankle, initial encounter    New Prescriptions Discharge Medication List as of 11/30/2016  6:25 PM     I personally performed the services described in this documentation, which was scribed in my presence. The recorded information has been reviewed and is accurate.     Jaynie Crumble, PA-C 11/30/16 1953    Alvira Monday, MD 12/01/16 1159

## 2016-12-01 ENCOUNTER — Telehealth: Payer: Self-pay | Admitting: Obstetrics & Gynecology

## 2016-12-01 NOTE — Telephone Encounter (Signed)
Spoke with patient. Advised of message as seen below from Dr.Silva. Patient states she cannot seek care with her PCP has not been seen there in 2 and 1/2 years and does not wish to go back. Advised can refer her to a new PCP. Patient declines. "I see Dr.Miller very often. We have a special relationship. She helps me when she can." Advised Dr.Miller is out of the office this week and this medication is not something we can prescribe. Patient again states "I am not going to see my PCP for an appointment just for this ointment. I was just in the ED."   Reviewed with Billie Ruddy, RN. Patient may contact [facility] ED so case manager can review and send in ointment as needed for patient.  Attempted to reach patient at phone number provided [phone], there was no answer and recording states the voicemail box is full.

## 2016-12-01 NOTE — Telephone Encounter (Signed)
Attempted to reach patient at number provided [phone], there was no answer and recording states the voicemail box is full.
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
## 2016-12-01 NOTE — Telephone Encounter (Signed)
Spoke with patient. Patient states that she was seen at [facility] last night for self inflicted cut on left leg. Had to have stiches. Was advised to follow up with psychiatry. Patient reports she has an appointment on 12/05/2016 and that her psychiatrist is aware of her situation. Denies SI/HI. Feels stable today. Is staying with her mother for support. Reports she was advised by the ER to apply ointment to cut to prevent infection. Patient states she is allergic to neosporin, bacitracin, and neomycin-bacitracin-polymyxin. Has used mupirocin in the past without problems. Thought she had some at home so did not get rx from ED. Asking for recommendations on ointment to apply to cut for healing or rx for mupirocin until she can be seen for a follow appointment.

## 2016-12-01 NOTE — Telephone Encounter (Signed)
Attempted to reach patient at phone number provided [phone], there was no answer and recording states that the voicemail box is full.

## 2016-12-01 NOTE — Telephone Encounter (Signed)
Please have patient see PCP.  If she does not have one, we can refer her.

## 2016-12-03 ENCOUNTER — Emergency Department (HOSPITAL_COMMUNITY)
Admission: EM | Admit: 2016-12-03 | Discharge: 2016-12-03 | Disposition: A | Discharge status: Other Acute Inpatient Hospital | Payer: BLUE CROSS/BLUE SHIELD

## 2016-12-05 NOTE — Telephone Encounter (Signed)
Patient calling requesting a script for diflucan.  States she has a [place] infection from the beach.  Patient is leaving for [place] on Thursday

## 2016-12-05 NOTE — Telephone Encounter (Signed)
Spoke with patient who states she has taken 2 rounds of Amoxicillin for oral surgery and URI. Has been out bny th pool over the weekend. Reports she is now having vaginal itching and thick white discharge. Requesting rx for Diflucan.  Routing to Dr.Miller for review.

## 2016-12-05 NOTE — Telephone Encounter (Signed)
Patient called again because she failed to mention she has been on antibiotics and thinks this may have a yeast infection.

## 2016-12-06 MED ORDER — FLUCONAZOLE 150 MG PO TABS: ORAL_TABLET | ORAL | 0 refills | Status: DC

## 2016-12-06 NOTE — Telephone Encounter (Signed)
Rx for Diflucan 150 mg take 1 tablet po now, repeat in 72 hours if symptoms persist #2 0RF sent to pharmacy on file. Patient verbalizes understanding and aware she wil need and OV if symptoms persist.  Routing to provider for final review. Patient agreeable to disposition. Will close encounter.

## 2016-12-06 NOTE — Telephone Encounter (Signed)
OK to send in rx for Diflucan 150mg  po x 1, repeat 72 hours.  If not better will need OV.  Thanks.

## 2016-12-12 ENCOUNTER — Telehealth: Payer: Self-pay | Admitting: Obstetrics & Gynecology
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
## 2016-12-12 NOTE — Telephone Encounter (Signed)
Left message to call Lakindra Wible at [phone]. 

## 2016-12-14 ENCOUNTER — Emergency Department (HOSPITAL_COMMUNITY): Payer: BLUE CROSS/BLUE SHIELD

## 2016-12-14 ENCOUNTER — Ambulatory Visit (INDEPENDENT_AMBULATORY_CARE_PROVIDER_SITE_OTHER): Payer: BLUE CROSS/BLUE SHIELD | Admitting: Certified Nurse Midwife

## 2016-12-14 ENCOUNTER — Encounter: Payer: Self-pay | Admitting: Certified Nurse Midwife

## 2016-12-14 ENCOUNTER — Emergency Department (HOSPITAL_COMMUNITY)
Admission: EM | Admit: 2016-12-14 | Discharge: 2016-12-14 | Disposition: A | Discharge status: Home / Self Care | Payer: BLUE CROSS/BLUE SHIELD | Attending: Emergency Medicine | Admitting: Emergency Medicine

## 2016-12-14 VITALS — BP 102/64 | HR 70 | Temp 98.8°F | Resp 16 | Ht 64.5 in | Wt 120.0 lb

## 2016-12-14 DIAGNOSIS — T7421XA Adult sexual abuse, confirmed, initial encounter: Secondary | ICD-10-CM | POA: Insufficient documentation

## 2016-12-14 DIAGNOSIS — Z79899 Other long term (current) drug therapy: Secondary | ICD-10-CM | POA: Insufficient documentation

## 2016-12-14 DIAGNOSIS — Y999 Unspecified external cause status: Secondary | ICD-10-CM | POA: Diagnosis not present

## 2016-12-14 DIAGNOSIS — Y9389 Activity, other specified: Secondary | ICD-10-CM | POA: Insufficient documentation

## 2016-12-14 DIAGNOSIS — Y929 Unspecified place or not applicable: Secondary | ICD-10-CM | POA: Insufficient documentation

## 2016-12-14 DIAGNOSIS — R457 State of emotional shock and stress, unspecified: Secondary | ICD-10-CM

## 2016-12-14 DIAGNOSIS — S0083XA Contusion of other part of head, initial encounter: Secondary | ICD-10-CM | POA: Insufficient documentation

## 2016-12-14 DIAGNOSIS — S0993XA Unspecified injury of face, initial encounter: Secondary | ICD-10-CM | POA: Diagnosis present

## 2016-12-14 LAB — RAPID HIV SCREEN (HIV 1/2 AB+AG)
HIV 1/2 Antibodies: NONREACTIVE
HIV-1 P24 Antigen - HIV24: NONREACTIVE

## 2016-12-14 LAB — WET PREP, GENITAL
Clue Cells Wet Prep HPF POC: NONE SEEN
SPERM: NONE SEEN
TRICH WET PREP: NONE SEEN
YEAST WET PREP: NONE SEEN

## 2016-12-14 MED ORDER — AZITHROMYCIN 250 MG PO TABS
2000.0000 mg | ORAL_TABLET | Freq: Once | ORAL | Status: AC
  Administered 2016-12-14: 2000 mg via ORAL
  Filled 2016-12-14: qty 8

## 2016-12-14 MED ORDER — PROPOFOL 10 MG/ML IV BOLUS: 0.5000 mg/kg | Freq: Once | INTRAVENOUS | Status: DC

## 2016-12-14 MED ORDER — ONDANSETRON 8 MG PO TBDP: 8.0000 mg | ORAL_TABLET | Freq: Once | ORAL | Status: DC

## 2016-12-14 NOTE — Progress Notes (Signed)
Encounter reviewed by Dr. Brook Amundson C. Silva.  
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
## 2016-12-14 NOTE — Telephone Encounter (Signed)
Patient seen in office, see OV dated 12/14/16.   Routing to provider for final review.Will close encounter.  Cc: Dr. Hyacinth Meeker
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
## 2016-12-14 NOTE — Discharge Instructions (Signed)
We will call you if your cultures indicate you require further treatment or action.  Follow-up with your primary Dr. for repeat testing in 6 months.  Return to the emergency department if you experience any new or concerning symptoms.
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
## 2016-12-15 ENCOUNTER — Telehealth: Payer: Self-pay | Admitting: Obstetrics & Gynecology

## 2016-12-15 LAB — HEPATITIS PANEL, ACUTE
HEP A IGM: NEGATIVE
HEP B S AG: NEGATIVE
Hep B C IgM: NEGATIVE

## 2016-12-15 NOTE — Telephone Encounter (Signed)
Patient called requesting to get a message to our nurse practitioner, Leota Sauers, CNM. She wanted to report she was dissatisfied with the outcome of her last visit here in our office on 12/14/16 and that she was seen in the ED yesterday.   Separate staff message sent to lead physician, Dr. Hyacinth Meeker and Leota Sauers, CNM for review.

## 2016-12-16 LAB — GC/CHLAMYDIA PROBE AMP (~~LOC~~) NOT AT ARMC
CHLAMYDIA, DNA PROBE: NEGATIVE
NEISSERIA GONORRHEA: NEGATIVE

## 2016-12-20 ENCOUNTER — Telehealth: Payer: Self-pay | Admitting: Obstetrics & Gynecology

## 2016-12-20 NOTE — Telephone Encounter (Signed)
Patient wants to schedule an appointment for a yeast infection.
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
## 2016-12-23 ENCOUNTER — Ambulatory Visit: Payer: BLUE CROSS/BLUE SHIELD | Admitting: Obstetrics & Gynecology

## 2016-12-23 ENCOUNTER — Telehealth: Payer: Self-pay | Admitting: Obstetrics & Gynecology

## 2016-12-23 NOTE — Telephone Encounter (Signed)
Patient left voicemail during lunch that she will be cancelling her appointment for this afternoon.  She states her mother needs her and she is more important.

## 2017-01-06 ENCOUNTER — Emergency Department (HOSPITAL_COMMUNITY)
Admission: EM | Admit: 2017-01-06 | Discharge: 2017-01-06 | Disposition: A | Discharge status: Home / Self Care | Payer: BLUE CROSS/BLUE SHIELD | Attending: Emergency Medicine | Admitting: Emergency Medicine

## 2017-01-06 DIAGNOSIS — Y999 Unspecified external cause status: Secondary | ICD-10-CM | POA: Diagnosis not present

## 2017-01-06 DIAGNOSIS — F329 Major depressive disorder, single episode, unspecified: Secondary | ICD-10-CM | POA: Insufficient documentation

## 2017-01-06 DIAGNOSIS — F32A Depression, unspecified: Secondary | ICD-10-CM

## 2017-01-06 DIAGNOSIS — X789XXA Intentional self-harm by unspecified sharp object, initial encounter: Secondary | ICD-10-CM | POA: Insufficient documentation

## 2017-01-06 DIAGNOSIS — Y939 Activity, unspecified: Secondary | ICD-10-CM | POA: Diagnosis not present

## 2017-01-06 DIAGNOSIS — Y929 Unspecified place or not applicable: Secondary | ICD-10-CM | POA: Insufficient documentation

## 2017-01-06 DIAGNOSIS — S91012A Laceration without foreign body, left ankle, initial encounter: Secondary | ICD-10-CM | POA: Diagnosis present

## 2017-01-06 MED ORDER — LIDOCAINE-EPINEPHRINE (PF) 2 %-1:200000 IJ SOLN
10.0000 mL | Freq: Once | INTRAMUSCULAR | Status: AC
  Administered 2017-01-06: 10 mL via INTRADERMAL
  Filled 2017-01-06: qty 20

## 2017-01-06 NOTE — Discharge Instructions (Signed)
Keep wound clean and dry. Suture removal in 10 days. Follow up with psychiatrist for depression.
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
## 2017-01-06 NOTE — ED Notes (Signed)
Bed: [facility] Expected date:  Expected time:  Means of arrival:  Comments: 

## 2017-01-14 ENCOUNTER — Other Ambulatory Visit: Payer: Self-pay | Admitting: Obstetrics & Gynecology

## 2017-01-17 ENCOUNTER — Inpatient Hospital Stay: Admission: RE | Admit: 2017-01-17 | Payer: Self-pay | Source: Ambulatory Visit

## 2017-01-17 NOTE — Telephone Encounter (Signed)
Medication refill request: LOPREEZA  Last AEX:  06/21/16 PG Next AEX: 06/26/17 SM Last MMG (if hormonal medication request): 08/14/15, [place] 1: Negative -- scheduled today Refill authorized: 09/09/16 #30 w/3 refills; today please advise

## 2017-01-31 ENCOUNTER — Ambulatory Visit: Payer: Self-pay

## 2017-02-05 ENCOUNTER — Encounter (HOSPITAL_COMMUNITY): Payer: Self-pay | Admitting: Emergency Medicine

## 2017-02-05 ENCOUNTER — Emergency Department (HOSPITAL_COMMUNITY): Payer: BLUE CROSS/BLUE SHIELD

## 2017-02-05 ENCOUNTER — Emergency Department (HOSPITAL_COMMUNITY)
Admission: EM | Admit: 2017-02-05 | Discharge: 2017-02-05 | Discharge status: Against Medical Advice | Payer: BLUE CROSS/BLUE SHIELD | Attending: Emergency Medicine | Admitting: Emergency Medicine

## 2017-02-05 DIAGNOSIS — R1013 Epigastric pain: Secondary | ICD-10-CM | POA: Insufficient documentation

## 2017-02-05 DIAGNOSIS — Z79899 Other long term (current) drug therapy: Secondary | ICD-10-CM | POA: Insufficient documentation

## 2017-02-05 DIAGNOSIS — R0789 Other chest pain: Secondary | ICD-10-CM | POA: Diagnosis present

## 2017-02-05 DIAGNOSIS — R1010 Upper abdominal pain, unspecified: Secondary | ICD-10-CM

## 2017-02-05 LAB — BASIC METABOLIC PANEL
ANION GAP: 9 (ref 5–15)
BUN: 12 mg/dL (ref 6–20)
CALCIUM: 9.4 mg/dL (ref 8.9–10.3)
CHLORIDE: 104 mmol/L (ref 101–111)
CO2: 27 mmol/L (ref 22–32)
CREATININE: 0.77 mg/dL (ref 0.44–1.00)
GFR calc Af Amer: >60 mL/min (ref >60)
Glucose, Bld: 109 mg/dL — ABNORMAL HIGH (ref 65–99)
Potassium: 4.3 mmol/L (ref 3.5–5.1)
Sodium: 140 mmol/L (ref 135–145)

## 2017-02-05 LAB — POCT I-STAT TROPONIN I: TROPONIN I, POC: 0 ng/mL (ref 0.00–0.08)

## 2017-02-05 LAB — CBC
HCT: 38.8 % (ref 36.0–46.0)
Hemoglobin: 12.9 g/dL (ref 12.0–15.0)
MCH: 31.5 pg (ref 26.0–34.0)
MCHC: 33.2 g/dL (ref 30.0–36.0)
MCV: 94.6 fL (ref 78.0–100.0)
PLATELETS: 217 10*3/uL (ref 150–400)
RBC: 4.1 MIL/uL (ref 3.87–5.11)
RDW: 12.7 % (ref 11.5–15.5)
WBC: 10.2 10*3/uL (ref 4.0–10.5)

## 2017-02-05 LAB — HEPATIC FUNCTION PANEL
ALT: 19 U/L (ref 14–54)
AST: 20 U/L (ref 15–41)
Albumin: 4.1 g/dL (ref 3.5–5.0)
Alkaline Phosphatase: 51 U/L (ref 38–126)
Bilirubin, Direct: <0.1 mg/dL — ABNORMAL LOW (ref 0.1–0.5)
TOTAL PROTEIN: 7.3 g/dL (ref 6.5–8.1)
Total Bilirubin: 0.4 mg/dL (ref 0.3–1.2)

## 2017-02-05 LAB — LIPASE, BLOOD: LIPASE: 42 U/L (ref 11–51)

## 2017-02-05 MED ORDER — GI COCKTAIL ~~LOC~~
30.0000 mL | Freq: Once | ORAL | Status: AC
  Administered 2017-02-05: 30 mL via ORAL
  Filled 2017-02-05: qty 30

## 2017-02-05 MED ORDER — FAMOTIDINE 20 MG PO TABS
40.0000 mg | ORAL_TABLET | Freq: Once | ORAL | Status: AC
  Administered 2017-02-05: 40 mg via ORAL
  Filled 2017-02-05: qty 2

## 2017-02-05 MED ORDER — SUCRALFATE 1 G PO TABS
1.0000 g | ORAL_TABLET | Freq: Once | ORAL | Status: AC
  Administered 2017-02-05: 1 g via ORAL
  Filled 2017-02-05: qty 1
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
## 2017-02-05 NOTE — ED Triage Notes (Signed)
Pt reports chest pain, midsternal since 7am. Took TUMS and peptobismal since last night. Reports gas pains after eating last night. Reports nausea this am, denies vomiting. Reports hx of GERD that was treated

## 2017-02-05 NOTE — ED Notes (Signed)
Pt left AMA---- pt stated, "I really need to go home now.  I need to take care of my cat".

## 2017-02-05 NOTE — ED Notes (Signed)
Called for labs. Did not respond.

## 2017-02-14 ENCOUNTER — Telehealth: Payer: Self-pay | Admitting: Obstetrics & Gynecology

## 2017-02-14 NOTE — Telephone Encounter (Signed)
Spoke with patient. Patient states that she would like to be seen for ongoing irritation with lichen sclerosus and feels she may have a yeast infection. Having vaginal irritation and itching. Only wants to see Dr.Miller. Appointment scheduled for 02/17/2017 at 3 pm with Dr.Miller. Patient declines earlier appointment offered. Will return call if symptoms worsen or develops new symptoms so she can be seen earlier for evaluation.  Routing to provider for final review. Patient agreeable to disposition. Will close encounter.

## 2017-02-14 NOTE — Telephone Encounter (Signed)
Patient wants to come in for an appointment for Lichen Sclerosus and a yeast infection.

## 2017-02-17 ENCOUNTER — Ambulatory Visit (INDEPENDENT_AMBULATORY_CARE_PROVIDER_SITE_OTHER): Payer: BLUE CROSS/BLUE SHIELD | Admitting: Obstetrics & Gynecology

## 2017-02-17 VITALS — BP 108/66 | HR 88 | Resp 16 | Ht 64.5 in | Wt 120.0 lb

## 2017-02-17 DIAGNOSIS — N898 Other specified noninflammatory disorders of vagina: Secondary | ICD-10-CM | POA: Diagnosis not present
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
## 2017-02-18 LAB — VAGINITIS/VAGINOSIS, DNA PROBE
Candida Species: NEGATIVE
Gardnerella vaginalis: POSITIVE — AB
Trichomonas vaginosis: NEGATIVE

## 2017-02-20 ENCOUNTER — Telehealth: Payer: Self-pay

## 2017-02-20 ENCOUNTER — Encounter: Payer: Self-pay | Admitting: Obstetrics & Gynecology

## 2017-02-20 NOTE — Telephone Encounter (Signed)
-----   Message from Jerene Bears, MD sent at 02/19/2017  9:34 PM EDT ----- Please let pt know her testing was positive for BV.  Ok to treat with Metrogel 0.75%, one applicator QHS x 5 nights OR Tindamax 1gm po daily for 5 days.  If pt chooses oral medication, please advise no ETOH while on medication.  No additional follow up is needed if symptoms resolve with treatment.

## 2017-02-20 NOTE — Telephone Encounter (Signed)
Called patient to discuss results of vaginitis testing, LMOVM to call me back.

## 2017-02-20 NOTE — Telephone Encounter (Signed)
patient returning call.

## 2017-02-27 ENCOUNTER — Telehealth: Payer: Self-pay | Admitting: Obstetrics & Gynecology

## 2017-02-27 MED ORDER — METRONIDAZOLE 0.75 % VA GEL: VAGINAL | 0 refills | Status: DC

## 2017-02-27 NOTE — Telephone Encounter (Signed)
Patient was treated recently for bacterial vaginosis and is wondering if her husband will need to be treated also?

## 2017-02-27 NOTE — Telephone Encounter (Signed)
Spoke with patient, advised of results as seen below per Dr. Hyacinth Meeker. RX for Metrogel to verified pharmacy. ETOH precautions reviewed. Patient verbalizes understanding and is agreeable.   Patient is agreeable to disposition. Will close encounter.

## 2017-02-27 NOTE — Telephone Encounter (Signed)
Left message to call Erika Mccoy at [phone].  

## 2017-03-02 NOTE — Telephone Encounter (Signed)
Spoke with patient, advised BV is not STD, no treatment for males. Recommended abstinence during treatment of BV with metrogel to allow ph in vagina to re balance. Reviewed pericare, reduce scented soaps/detergents, no douching.  Patient verbalizes understanding and is agreeable. Advised Dr. Hyacinth Meeker will review, will return call with any additional recommendations, patient is agreeable.   Routing to provider for final review. Patient is agreeable to disposition. Will close encounter.

## 2017-03-07 ENCOUNTER — Other Ambulatory Visit: Payer: Self-pay | Admitting: Obstetrics & Gynecology

## 2017-03-07 ENCOUNTER — Telehealth: Payer: Self-pay | Admitting: Obstetrics & Gynecology

## 2017-03-07 MED ORDER — FLUCONAZOLE 150 MG PO TABS: 150.0000 mg | ORAL_TABLET | Freq: Once | ORAL | 0 refills | Status: AC

## 2017-03-07 NOTE — Telephone Encounter (Signed)
Spoke with patient. Patient states she has taken diflucan in the past with no reactions. Advised as seen below per Dr. Hyacinth Meeker. Patient verbalizes understanding and is agreeable.   Patient would like Dr. Hyacinth Meeker to know she was seen for endoscopy and colonoscopy on 10/22, has gastritis and was started on Sucralfate 1g before meals.   Routing to provider for final review. Patient is agreeable to disposition. Will close encounter.

## 2017-03-07 NOTE — Telephone Encounter (Signed)
Rx for diflucan 150mg  po x 1, repeat 72 hours sent to pharmacy.  Please let confirm she can take this.  Thanks.

## 2017-03-07 NOTE — Telephone Encounter (Signed)
Spoke with patient:  1. Completed metrogel for BV on 10/19  2. Started Macrobid for UTI -seen at Urgent Care "around 10/8"  3. Developed "weird sore" where tooth that was pulled 3 months ago, started on PCN 500 mg qid for 7 days, started 10/16.  4. Reports thick, white, cottage cheese vaginal discharge, vaginal itching and burning. Odor -"smells like yeast". Started 2 days after starting PCN.   Patient requesting RX for yeast.  Advised patient would review with Dr. Hyacinth MeekerMiller and return call with recommendations, patient is agreeable.  Dr. Hyacinth MeekerMiller -please advise on RX?

## 2017-03-07 NOTE — Telephone Encounter (Signed)
Patient called requesting to speak with the nurse. She said she was being treated for a bacterial infection and then also was prescribed antibiotics from a dentist. She said bacterial infection has not gone away after taking the antibiotics.  Local pharmacy needs updated per patient to: [facility] on [address]

## 2017-03-09 ENCOUNTER — Encounter (HOSPITAL_COMMUNITY): Payer: Self-pay | Admitting: *Deleted

## 2017-03-09 ENCOUNTER — Emergency Department (HOSPITAL_COMMUNITY)
Admission: EM | Admit: 2017-03-09 | Discharge: 2017-03-10 | Disposition: A | Discharge status: Home / Self Care | Payer: BLUE CROSS/BLUE SHIELD | Attending: Emergency Medicine | Admitting: Emergency Medicine

## 2017-03-09 ENCOUNTER — Telehealth: Payer: Self-pay | Admitting: Obstetrics & Gynecology

## 2017-03-09 DIAGNOSIS — Z79899 Other long term (current) drug therapy: Secondary | ICD-10-CM | POA: Diagnosis not present

## 2017-03-09 DIAGNOSIS — S81812A Laceration without foreign body, left lower leg, initial encounter: Secondary | ICD-10-CM | POA: Diagnosis not present

## 2017-03-09 DIAGNOSIS — Y9389 Activity, other specified: Secondary | ICD-10-CM | POA: Diagnosis not present

## 2017-03-09 DIAGNOSIS — Y999 Unspecified external cause status: Secondary | ICD-10-CM | POA: Insufficient documentation

## 2017-03-09 DIAGNOSIS — X781XXA Intentional self-harm by knife, initial encounter: Secondary | ICD-10-CM | POA: Insufficient documentation

## 2017-03-09 DIAGNOSIS — S8992XA Unspecified injury of left lower leg, initial encounter: Secondary | ICD-10-CM | POA: Diagnosis present

## 2017-03-09 DIAGNOSIS — Y929 Unspecified place or not applicable: Secondary | ICD-10-CM | POA: Diagnosis not present

## 2017-03-09 MED ORDER — LIDOCAINE HCL (PF) 1 % IJ SOLN
10.0000 mL | Freq: Once | INTRAMUSCULAR | Status: AC
  Administered 2017-03-09: 10 mL via INTRADERMAL
  Filled 2017-03-09: qty 10

## 2017-03-09 MED ORDER — FLUCONAZOLE 150 MG PO TABS: ORAL_TABLET | ORAL | 0 refills | Status: DC
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
## 2017-03-09 NOTE — ED Triage Notes (Signed)
The pt is c/o a laceration to her lt ankle  She did it herself with a kniife  She is a cutter  She is not hi or si  lmp  none

## 2017-03-09 NOTE — Telephone Encounter (Signed)
Rx for diflucan sent to [facility], [place] on 03/07/17. RN called to cancel, spoke with [name].   New Rx placed for Diflucan #2/0RF to [facility].   Spoke with patient, advised as seen above. Pharmacy updated and [facility] removed per pt request. Patient verbalizes understanding and is agreeable.  Routing to provider for final review. Patient is agreeable to disposition. Will close encounter.

## 2017-03-09 NOTE — Telephone Encounter (Signed)
Patient says Dr Hyacinth MeekerMiller was going to send a prescription for diflucan over to the pharmacy. [facility] at [phone].

## 2017-03-10 NOTE — Discharge Instructions (Signed)
Keep the wound clean and dry for the first 24 hours. After that you may gently clean the wound with soap and water. Make sure to pat dry the wound before covering it with any dressing. You can use topical antibiotic ointment and bandage. Ice and elevate for pain relief.   You can take Tylenol or Ibuprofen as directed for pain. You can alternate Tylenol and Ibuprofen every 4 hours for additional pain relief.   As we discussed, you need to follow up with her therapist and psychiatrist. Call and arrange for an appointment in the next 24-48 hours for further evaluation.  Return to the Emergency Department, your primary care doctor, or the [facility] in 7 days for suture removal.   Monitor closely for any signs of infection. Return to the Emergency Department for any worsening redness/swelling of the area that begins to spread, drainage from the site, worsening pain, fever or any other worsening or concerning symptoms.

## 2017-03-13 ENCOUNTER — Telehealth: Payer: Self-pay | Admitting: Obstetrics & Gynecology

## 2017-03-13 MED ORDER — FLUCONAZOLE 150 MG PO TABS: ORAL_TABLET | ORAL | 0 refills | Status: DC

## 2017-03-13 NOTE — Telephone Encounter (Signed)
Patient called after hours on Friday, 03/10/17, and left a message that the pharmacy still does not have a prescription for Diflucan on file for her. See telephone note dated 03/09/17 for more details.

## 2017-03-13 NOTE — Telephone Encounter (Signed)
Patient's spouse [name] is calling to request a refill of Diflucan for his wife. [facility] at [address]. DPR on file to talk with george.

## 2017-03-13 NOTE — Telephone Encounter (Signed)
Per review of patient's chart rx for Diflucan 150 mg was printed not sent electronically. Rx for Diflucan 150 mg po take 1 tablet now, repeat in 72 hours if symptoms persist [facility] [address] Patient has been notified and is agreeable.   Routing to provider for final review. Patient agreeable to disposition. Will close encounter.

## 2017-03-13 NOTE — Telephone Encounter (Signed)
Refill has been sent to the pharmacy. Per previous telephone encounter, patient has been notified.

## 2017-03-22 ENCOUNTER — Telehealth: Payer: Self-pay | Admitting: Obstetrics & Gynecology

## 2017-03-22 NOTE — Telephone Encounter (Signed)
Patient states she is having a discharge and a pain on her right side over where she thinks her ovary is.

## 2017-03-22 NOTE — Telephone Encounter (Signed)
Patient returned call. Patient states," I need to schedule an appointment." Patient did not want to provide details and states, "I have a raging yeast infection and pain in my right side near my ovary and I know I need to be seen." Patient states she will only see Dr. Hyacinth Meeker. Office visit scheduled for Thursday 03/23/17 at 1045 with Dr. Hyacinth Meeker. Patient agreeable to date and time of appointment. Precautions given for abdominal pain and patient states, "I know to go to the ER, but I'm not at that point."   Routing to provider for final review. Patient agreeable to disposition. Will close encounter.

## 2017-03-22 NOTE — Telephone Encounter (Signed)
Left message to call Deontae Robson at [phone].  

## 2017-03-23 ENCOUNTER — Encounter: Payer: Self-pay | Admitting: Obstetrics & Gynecology

## 2017-03-23 ENCOUNTER — Ambulatory Visit: Payer: BLUE CROSS/BLUE SHIELD | Admitting: Obstetrics & Gynecology

## 2017-03-23 VITALS — BP 110/70 | HR 88 | Resp 14 | Ht 64.5 in | Wt 119.0 lb

## 2017-03-23 DIAGNOSIS — R1031 Right lower quadrant pain: Secondary | ICD-10-CM

## 2017-03-23 DIAGNOSIS — R102 Pelvic and perineal pain: Secondary | ICD-10-CM | POA: Diagnosis not present

## 2017-03-23 DIAGNOSIS — N898 Other specified noninflammatory disorders of vagina: Secondary | ICD-10-CM | POA: Diagnosis not present

## 2017-03-23 LAB — POCT URINALYSIS DIPSTICK
BILIRUBIN UA: NEGATIVE
GLUCOSE UA: NEGATIVE
Ketones, UA: NEGATIVE
NITRITE UA: NEGATIVE
PH UA: 5 (ref 5.0–8.0)
Protein, UA: NEGATIVE
Urobilinogen, UA: 0.2 E.U./dL

## 2017-03-23 NOTE — Progress Notes (Signed)
Scheduled patient while in office for CT abdomen pelvis w contrast at [address] 03/27/2017 at 4:20 pm. Patient is aware she will need to remain NPO for 4 hours prior to exam. [facility]. Placed in imaging hold.
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
## 2017-03-24 LAB — VAGINITIS/VAGINOSIS, DNA PROBE
Candida Species: NEGATIVE
Gardnerella vaginalis: NEGATIVE
Trichomonas vaginosis: NEGATIVE

## 2017-03-27 ENCOUNTER — Telehealth: Payer: Self-pay | Admitting: *Deleted

## 2017-03-27 ENCOUNTER — Ambulatory Visit
Admission: RE | Admit: 2017-03-27 | Discharge: 2017-03-27 | Disposition: A | Discharge status: Home / Self Care | Payer: BLUE CROSS/BLUE SHIELD | Source: Ambulatory Visit | Attending: Obstetrics & Gynecology | Admitting: Obstetrics & Gynecology

## 2017-03-27 DIAGNOSIS — R1031 Right lower quadrant pain: Secondary | ICD-10-CM

## 2017-03-27 MED ORDER — IOPAMIDOL (ISOVUE-300) INJECTION 61%
100.0000 mL | Freq: Once | INTRAVENOUS | Status: AC | PRN
  Administered 2017-03-27: 100 mL via INTRAVENOUS

## 2017-03-27 NOTE — Telephone Encounter (Signed)
Call to patient. Results reviewed with patient and she verbalized understanding. Patient asking if Dr. Hyacinth Meeker has "any idea what this is since the testing showed nothing." She states, "I haven't gone my whole life like this." Patient states the mixture of hydrogen peroxide and water helps with her symptoms, "but it just isn't a happy place down there." Patient wants to know if Dr. Hyacinth Meeker has any additional recommendations. RN advised would review with Dr. Hyacinth Meeker and return call. Patient agreeable.   Routing to provider for review.

## 2017-03-27 NOTE — Telephone Encounter (Signed)
-----   Message from Jerene BearsMary S Miller, MD sent at 03/26/2017 11:05 PM EST ----- Please let pt know her affirm was negative.  Can douche with 1/2 hydrogen peroxide and 1/2 water mixed.

## 2017-03-29 ENCOUNTER — Telehealth: Payer: Self-pay

## 2017-03-29 NOTE — Telephone Encounter (Signed)
Left message to call Kaitlyn at [phone]. 

## 2017-03-29 NOTE — Telephone Encounter (Signed)
-----   Message from Jerene Bears, MD sent at 03/27/2017  9:55 PM EST ----- Please let pt know her CT scan was normal except for mild dilation of the gall bladder duct and pancreatic duct but these were both seen in 1/18 on CT when she was in the ER and stable (no change), so this is not a worry and is not the cause of her symptoms.  Please ask for an update.  Thanks.

## 2017-03-31 NOTE — Telephone Encounter (Signed)
Spoke with patient. Advised of results as seen below from Dr.Miller. Patient verbalizes understanding. States she is feeling much better. Still has intermittent RLQ pain, but states this is not as significant as it was previously. Denies any new or worsening symptoms. Advised to monitor and if her symptoms worsen or she develops new symptoms she will need to seen for further evaluation. Advised will review with Dr.Miller and return call if she has additional recommendations.

## 2017-05-16 HISTORY — PX: DENTAL SURGERY: SHX609

## 2017-05-23 ENCOUNTER — Telehealth: Payer: Self-pay | Admitting: *Deleted

## 2017-05-23 ENCOUNTER — Ambulatory Visit: Payer: BLUE CROSS/BLUE SHIELD | Admitting: Family Medicine

## 2017-05-23 ENCOUNTER — Encounter: Payer: Self-pay | Admitting: Family Medicine

## 2017-05-23 VITALS — BP 120/74 | HR 96 | Temp 98.4°F | Resp 20

## 2017-05-23 DIAGNOSIS — J4520 Mild intermittent asthma, uncomplicated: Secondary | ICD-10-CM | POA: Insufficient documentation

## 2017-05-23 DIAGNOSIS — F411 Generalized anxiety disorder: Secondary | ICD-10-CM | POA: Diagnosis not present

## 2017-05-23 DIAGNOSIS — T7840XS Allergy, unspecified, sequela: Secondary | ICD-10-CM

## 2017-05-23 DIAGNOSIS — T7840XA Allergy, unspecified, initial encounter: Secondary | ICD-10-CM | POA: Insufficient documentation

## 2017-05-23 MED ORDER — PREDNISOLONE 15 MG/5ML PO SOLN: ORAL | 0 refills | Status: DC

## 2017-05-23 MED ORDER — ALBUTEROL SULFATE HFA 108 (90 BASE) MCG/ACT IN AERS: 2.0000 | INHALATION_SPRAY | RESPIRATORY_TRACT | 1 refills | Status: DC | PRN

## 2017-05-23 MED ORDER — LEVOCETIRIZINE DIHYDROCHLORIDE 5 MG PO TABS
5.0000 mg | ORAL_TABLET | Freq: Every evening | ORAL | 1 refills | Status: DC
Start: 2017-05-23 — End: 2017-06-29

## 2017-05-23 MED ORDER — RANITIDINE HCL 150 MG PO TABS: 150.0000 mg | ORAL_TABLET | Freq: Two times a day (BID) | ORAL | 5 refills | Status: DC

## 2017-05-23 MED ORDER — CETIRIZINE HCL 10 MG PO TABS: 10.0000 mg | ORAL_TABLET | Freq: Every day | ORAL | 1 refills | Status: DC

## 2017-05-23 NOTE — Telephone Encounter (Signed)
Spoke with patient advised of plan that we would send in prednisolone patient verbalized understanding and will check with pharmacy. If any trouble she will call me in the morning

## 2017-05-23 NOTE — Telephone Encounter (Signed)
Patient did not start prednisone pack given in office

## 2017-05-23 NOTE — Progress Notes (Signed)
[address] [place] [address] Dept: [phone]  FAMILY NURSE PRACTITIONER FOLLOW UP NOTE  Patient ID: Erika Mccoy, female    DOB: [birth year]  Age: 52 y.o. MRN: [id number] Date of Office Visit: 05/23/2017  Assessment  Chief Complaint: Nasal Congestion and Allergic Rhinitis   HPI Erika Mccoy is a 52 year old female who presents to the clinic for a sick visit today.  She was last seen in this office on 10/26/2016 [facility] for evaluation of acute maxillary sinusitis requiring a taper dose of prednisone for resolution.  At today's visit, she reports itching in her left nare, her left eye, and redness and itching on her neck and chest. She reports that yesterday she went for oral surgery to have a tooth implanted jaw.  During the surgery she reports that she received propofol and clindamycin through an IV and a prescription for prophylactic doxycycline. After she arrived at her home she discovered a tick on her back and immediately removed it.  She subsequently went to an urgent care where she reports that she was given IM Decadron to control her itching.  Upon returning to her home she took 4 tablets of Benadryl which she reports did not make any improvement.    She also reports having "hot spots"on her body one in her pelvic area which caused her to urinate and a second spot on her right foot and ankle.  These both resolved within several minutes.One day prior to her oral surgery, she reports having hot and burning symptoms on her left nare and left eye to which she applied ice with some relief.  She reports having similar symptoms in both nares as a child for which she would receive an injection of "adrenaline" from her pediatrician.  She is reporting that she has had side effects from oral prednisone in the past.  Her mild persistent asthma is reported as well controlled. She is using ProAir as needed which is less than one time a week.   She does have a history of  generalized anxiety and bipolar 1 disorder.   Drug Allergies:  Allergies  Allergen Reactions  . Ibuprofen Other (See Comments)    Per neurologist patient can not take due to it being a bladder irritant.  Per neurologist patient can not take due to it being a bladder irritant.   . Neomycin-Bacitracin-Polymyxin  [Bacitracin-Neomycin-Polymyxin] Other (See Comments) and Swelling    Hot inflammation  . Quinolones Other (See Comments)    pustules Vasculitis with Levaquin and Cipro  . Bactroban [Mupirocin] Itching  . Benzalkonium Chloride Itching  . Cefzil [Cefprozil] Hives  . Cephalexin Hives  . Ciprofloxacin Other (See Comments)    Small vessel vasculitis Small vessel vasculitis Unknown (Vasculitis)  . Levofloxacin     Vasculitis  . Lidocaine Other (See Comments)    Ointment caused burning. Ointment caused burning.  Junius Argyle Other (See Comments)    Sneezing  . Monistat [Miconazole] Other (See Comments) and Itching    burning Reaction unknown  . Neosporin [Neomycin-Bacitracin Zn-Polymyx] Swelling    Hot  . Prednisone     mood  . Quinine Other (See Comments)    Small vasculitis   . Septra [Sulfamethoxazole-Trimethoprim] Hives  . Valtrex [Valacyclovir Hcl] Other (See Comments)    Vomiting, diarrhea, and abdominal cramping.   . Adhesive [Tape] Rash and Other (See Comments)    Pulls skin off    Physical Exam: BP 120/74 (BP Location: Left Arm, Patient Position: Sitting, Cuff Size:  Normal)   Pulse 96   Temp 98.4 F (36.9 C) (Oral)   Resp 20   SpO2 97%    Physical Exam  Constitutional: She appears well-developed and well-nourished.  HENT:  Right Ear: External ear normal.  Left Ear: External ear normal.  Eyes normal.  Ears normal.  Pharynx slightly erythematous and edematous.  Tonsils within normal limit without exudate noted.  Bilateral nares erythematous and edematous with clear drainage noted.  Eyes: Conjunctivae are normal.  Neck: Normal range of motion. Neck  supple.  Cardiovascular: Normal rate, regular rhythm and normal heart sounds.  S1-S2 normal.  Regular heart rate and rhythm.  No murmur noted.  Pulmonary/Chest: Effort normal and breath sounds normal.  Lungs clear to auscultation  Musculoskeletal: Normal range of motion.  Neurological: She is alert.  Oriented to person, time, place.  Some disorientation as to the events surrounding the oral surgery, finding the tick on her back, and current antibiotics.  She reported several times "I am not thinking right".  Skin: Skin is warm and dry.  Skin reddened over the bridge of her nose and right and left side of her nose.  Skin reddened on her neck and chest.  All other areas remain clear at this time    Diagnostics: FVC 3.24, FEV1 2.71.  Predicted FVC 3.89, predicted FEV1 3.26.  Spirometry is within the normal range.    Assessment and Plan: 1. Mild intermittent asthma without complication   2. Anxiety, generalized     Meds ordered this encounter  Medications  . albuterol (PROVENTIL HFA;VENTOLIN HFA) 108 (90 Base) MCG/ACT inhaler    Sig: Inhale 2 puffs into the lungs every 4 (four) hours as needed for wheezing or shortness of breath.    Dispense:  1 Inhaler    Refill:  1  . levocetirizine (XYZAL) 5 MG tablet    Sig: Take 1 tablet (5 mg total) by mouth every evening.    Dispense:  30 tablet    Refill:  1  . cetirizine (ZYRTEC) 10 MG tablet    Sig: Take 1 tablet (10 mg total) by mouth daily.    Dispense:  13 tablet    Refill:  1  . ranitidine (ZANTAC) 150 MG tablet    Sig: Take 1 tablet (150 mg total) by mouth 2 (two) times daily.    Dispense:  60 tablet    Refill:  5    Patient Instructions  Itching  . Levocetirizine (Xyzal) 5 mg in morning and Cetirizine (Zyrtec) 10mg  at night and ranitidine (Zantac) 150 mg twice a day. If no symptoms for 7-14 days then decrease to. . Levocetirizine (Xyzal) 5 mg in morning and Cetirizine (Zyrtec) 10mg  at night and ranitidine (Zantac) 150 mg once  a day.  If no symptoms for 7-14 days then decrease to. . Levocetirizine (Xyzal) 5 mg in morning and Cetirizine (Zyrtec) 10mg  at night.  If no symptoms for 7-14 days then decrease to. . Levocetirizine (Xyzal) 5 mg once a day.   Prednisolone 10 mg twice a day for 4 days, then 10 mg on the 5th day. Then stop.   Stop clindamycin cream on your face for now.  Continue other medications as listed in your chart  Follow up in two weeks or sooner as needed   Begin Dymista nasal spray one spray in each nostril twice a day as needed for nasal symptoms  Follow up in 2 weeks or sooner as needed    Return in about 2 weeks (  around 06/06/2017), or if symptoms worsen or fail to improve.   For Harlem's allergic reaction she will take a combination of levocetirizine, cetirizine, and ranitidine.  Given the severity of her symptoms, I have also included low dose prednisolone as this has lower potential for side effects than prednisone.  I have discussed with Placida the possibility that prednisolone may cause the unwanted side effect that she had previously experienced with prednisone and she voiced understanding and agreed to this treatment.  She also understands that in an emergency including shortness of breath, tongue swelling, throat closing, abdominal pain, and diarrhea, or any significant side effects from the steroid, that she needs to call 911 for immediate for assistance.  Thank you for the opportunity to care for this patient.  Please do not hesitate to contact me with questions.  Thermon Leyland, FNP [facility]    I have provided oversight concerning [facility] Hole [facility]'s evaluation and treatment of this patient's health issues addressed during today's encounter.  I agree with the assessment and therapeutic plan as outlined in the note.   Signed,   R Jorene Guest, MD

## 2017-05-23 NOTE — Telephone Encounter (Signed)
Attempted to call pt 3 times due to her needing prednisolone instead of prednisone pack that was given. Called patient to advise her not to take prednisone we would call in prednisolone per Thurston Hole, NP. No answer could not leave message cause mailbox is full.

## 2017-05-23 NOTE — Telephone Encounter (Signed)
Patient called states that she had a tick bite 2 days ago and had oral surgery yesterday 05/22/17. States she has had a reaction on the same side she had oral surgery and one nostril is intense itching, drainage, runny nose, tingling she did go to urgent care last night and got a cortisone shot. Advised patient to come in to be evaluated by Anne,NP today @ 11:00am. Patient verbalized understanding

## 2017-05-23 NOTE — Patient Instructions (Addendum)
Itching  . Levocetirizine (Xyzal) 5 mg in morning and Cetirizine (Zyrtec) 10mg  at night and ranitidine (Zantac) 150 mg twice a day. If no symptoms for 7-14 days then decrease to. . Levocetirizine (Xyzal) 5 mg in morning and Cetirizine (Zyrtec) 10mg  at night and ranitidine (Zantac) 150 mg once a day.  If no symptoms for 7-14 days then decrease to. . Levocetirizine (Xyzal) 5 mg in morning and Cetirizine (Zyrtec) 10mg  at night.  If no symptoms for 7-14 days then decrease to. . Levocetirizine (Xyzal) 5 mg once a day.   Prednisolone 10 mg twice a day for 4 days, then 10 mg on the 5th day. Then stop.   Stop clindamycin cream on your face for now.  Continue other medications as listed in your chart  Follow up in two weeks or sooner as needed   Begin Dymista nasal spray one spray in each nostril twice a day as needed for nasal symptoms  Follow up in 2 weeks or sooner as needed

## 2017-06-02 ENCOUNTER — Telehealth: Payer: Self-pay | Admitting: Obstetrics & Gynecology

## 2017-06-02 NOTE — Telephone Encounter (Signed)
Left patient a message to call back to reschedule a future appointment that was cancelled by the provider on 06/26/17 for her AEX with Dr. Hyacinth Meeker.

## 2017-06-05 ENCOUNTER — Ambulatory Visit: Payer: BLUE CROSS/BLUE SHIELD | Admitting: Family Medicine

## 2017-06-05 ENCOUNTER — Encounter: Payer: Self-pay | Admitting: Family Medicine

## 2017-06-05 VITALS — BP 108/66 | HR 80 | Temp 98.2°F | Ht 64.5 in | Wt 120.0 lb

## 2017-06-05 DIAGNOSIS — J4531 Mild persistent asthma with (acute) exacerbation: Secondary | ICD-10-CM | POA: Insufficient documentation

## 2017-06-05 DIAGNOSIS — K219 Gastro-esophageal reflux disease without esophagitis: Secondary | ICD-10-CM | POA: Diagnosis not present

## 2017-06-05 DIAGNOSIS — J4530 Mild persistent asthma, uncomplicated: Secondary | ICD-10-CM

## 2017-06-05 DIAGNOSIS — J310 Chronic rhinitis: Secondary | ICD-10-CM | POA: Diagnosis not present

## 2017-06-05 DIAGNOSIS — F411 Generalized anxiety disorder: Secondary | ICD-10-CM

## 2017-06-05 MED ORDER — FLUTICASONE PROPIONATE HFA 110 MCG/ACT IN AERO: 2.0000 | INHALATION_SPRAY | Freq: Two times a day (BID) | RESPIRATORY_TRACT | 5 refills | Status: DC

## 2017-06-05 MED ORDER — BENZONATATE 100 MG PO CAPS: 100.0000 mg | ORAL_CAPSULE | Freq: Three times a day (TID) | ORAL | 0 refills | Status: DC | PRN

## 2017-06-05 NOTE — Patient Instructions (Addendum)
Asthma - Continue ProAir inhaler 2 puffs every 4 hours with a spacer for shortness of breath, cough, or wheezing - Begin Flovent 110 -2 puffs twice a day with a spacer for 2 weeks or until breathing returns to baseline - Avoid allergens  Allergic rhinitis - Begin Mucinex [phone] mg twice a day - Increase fluid intake - Tessalon Perles 100 mg three times a day as needed for cough - Xyzal as needed for a runny nose  GERD - Continue Dexilant 60 mg once a day - Avoid caffeine and chocolate consumption  Continue other medications as listed in your chart.  Follow up in 8 weeks or sooner if needed. Call me if this treatment plan is not working for you.

## 2017-06-05 NOTE — Progress Notes (Signed)
[address] [place] [address] Dept: [phone]  FOLLOW UP NOTE  Patient ID: Erika Mccoy, female    DOB: Nov 30, 1965  Age: 52 y.o. MRN: [id number] Date of Office Visit: 06/05/2017  Assessment  Chief Complaint: Follow-up (Pt presents for follow up of sinusitis. Pt believes she does have a mold sensitivity. ); Cough (Pt states she developed a cough with scratchy throat for about 5 days.); and Other (Pt c/o right ear pain for about 2 days)  HPI Erika Mccoy is a 52 year old female who presents to the clinic for a sick visit.  She was last seen in this clinic on 05/23/2017 by Thermon Leyland, FNP for evaluation of itching in her left nare and eye, redness and itching on her neck and chest after oral surgery, and asthma.  Erika Mccoy reports that she began to have a sore throat, productive cough with hard mucus, and ear pain that began on the left and moved to the right side on Friday.  She denies fever and sick contacts She reports that she was down in the basement on Wednesday and Thursday and her voice began to get hoarse.  She reports there is mold and mildew damage in her basement.  Asthma is reported is not well controlled with audible wheezing with activity, and cough which is worse when she goes outside into the cold.  She denies shortness of breath.  She has been using her Proventil inhaler every 4 hours with some relief.  Ear pain is reported as beginning in the left ear and now moved to the right ear and is stabbing in nature.  She reports the worst pain as when she is swallowing or coughing.  She reports she can hear a "gurgling sound ".  She has tried ice and heat applications to both ears with no satisfactory relief.  Allergic rhinitis is reported as well controlled.  She is currently using Xyzal 5 mg in the evening as well as occasional nasal saline rinses.  Her current medications are listed in the chart.  Drug Allergies:  Allergies  Allergen Reactions  .  Ibuprofen Other (See Comments)    Per neurologist patient can not take due to it being a bladder irritant.  Per neurologist patient can not take due to it being a bladder irritant.   . Neomycin-Bacitracin-Polymyxin  [Bacitracin-Neomycin-Polymyxin] Other (See Comments) and Swelling    Hot inflammation  . Quinolones Other (See Comments)    pustules Vasculitis with Levaquin and Cipro  . Bactroban [Mupirocin] Itching  . Benzalkonium Chloride Itching  . Cefzil [Cefprozil] Hives  . Cephalexin Hives  . Ciprofloxacin Other (See Comments)    Small vessel vasculitis Small vessel vasculitis Unknown (Vasculitis)  . Levofloxacin     Vasculitis  . Lidocaine Other (See Comments)    Ointment caused burning. Ointment caused burning.  Junius Argyle Other (See Comments)    Sneezing  . Monistat [Miconazole] Other (See Comments) and Itching    burning Reaction unknown  . Neosporin [Neomycin-Bacitracin Zn-Polymyx] Swelling    Hot  . Prednisone     mood  . Quinine Other (See Comments)    Small vasculitis   . Septra [Sulfamethoxazole-Trimethoprim] Hives  . Valtrex [Valacyclovir Hcl] Other (See Comments)    Vomiting, diarrhea, and abdominal cramping.   . Adhesive [Tape] Rash and Other (See Comments)    Pulls skin off    Physical Exam: BP 108/66 (BP Location: Left Arm, Patient Position: Sitting, Cuff Size: Normal)   Pulse 80  Temp 98.2 F (36.8 C)   Ht 5' 4.5" (1.638 m)   Wt 120 lb (54.4 kg)   SpO2 97%   BMI 20.28 kg/m    Physical Exam  Constitutional: She is oriented to person, place, and time. She appears well-developed and well-nourished.  HENT:  Bilateral nares normal.  Eyes normal.  Pharynx slightly erythematous with no exudate noted.  Left ear normal.  Right ear with clear effusion noted without drainage or redness.  Eyes: Conjunctivae are normal.  Neck: Neck supple.  Cardiovascular: Normal rate, regular rhythm and normal heart sounds.  S1-S2 normal.  Regular heart rate and rhythm.   No murmur noted.  Pulmonary/Chest: Effort normal and breath sounds normal.  Lungs clear to auscultation.  Musculoskeletal: Normal range of motion.  Neurological: She is alert and oriented to person, place, and time.  Skin: Skin is warm and dry.  Psychiatric: She has a normal mood and affect. Her behavior is normal.    Diagnostics: FVC 3.12, FEV1 2.42.  Predicted FVC 3.56, predicted FEV1 2.84.  Spirometry indicates normal function.  Post bronchodilator therapy FVC 3.41, FEV1 1 2.80.  Significant improvement post bronchodilator therapy (16%).  Assessment and Plan: 1. Anxiety, generalized   2. Chronic rhinitis   3. Gastroesophageal reflux disease, esophagitis presence not specified   4. Mild persistent asthma, uncomplicated     No orders of the defined types were placed in this encounter.   Patient Instructions  Asthma - Continue ProAir inhaler 2 puffs every 4 hours with a spacer for shortness of breath, cough, or wheezing - Begin Flovent 110 -2 puffs twice a day with a spacer for 2 weeks or until breathing returns to baseline - Avoid allergens  Allergic rhinitis - Begin Mucinex [phone] mg twice a day - Increase fluid intake - Tessalon Perles 100 mg three times a day as needed for cough - Xyzal as needed for a runny nose  GERD - Continue Dexilant 60 mg once a day - Avoid caffeine and chocolate consumption  Continue other medications as listed in your chart.  Follow up in 8 weeks or sooner if needed. Call me if this treatment plan is not working for you.   Return in about 2 months (around 08/03/2017), or if symptoms worsen or fail to improve.    Thank you for the opportunity to care for this patient.  Please do not hesitate to contact me with questions.  Thermon Leyland, FNP [facility]   I have provided oversight concerning Erika Mccoy's evaluation and treatment of this patient's health issues addressed during today's encounter.  I agree with  the assessment and therapeutic plan as outlined in the note.   Signed,   R Jorene Guest, MD

## 2017-06-07 ENCOUNTER — Telehealth: Payer: Self-pay

## 2017-06-07 MED ORDER — DEXTROMETHORPHAN HBR 10 MG/5ML PO LIQD: 30.0000 mg | Freq: Three times a day (TID) | ORAL | 0 refills | Status: DC | PRN

## 2017-06-07 NOTE — Telephone Encounter (Signed)
Patient was seen on 06/05/17 by Dewayne Hatch. She thought she was going to get a cough syrup. She stated the pearls do not work for her cough. She is barley sleeping at night and doesn't really have a voice.   Please Advise     [address] [facility]

## 2017-06-07 NOTE — Telephone Encounter (Signed)
Dr Delorse Lek please advise on syrup.

## 2017-06-07 NOTE — Telephone Encounter (Signed)
I talked to this patient via phone today and let her know that I have sent in a prescription for dextromethorphan to her pharmacy. She verbalized acceptance and understanding. Thank you

## 2017-06-07 NOTE — Addendum Note (Signed)
Addended by: Hetty Blend on: 06/07/2017 02:55 PM   Modules accepted: Orders

## 2017-06-07 NOTE — Telephone Encounter (Signed)
Forwarding message to NP Ambs.

## 2017-06-07 NOTE — Telephone Encounter (Signed)
Left a message for patient to cal back. OV note states tessalon perles to be sent in, not a cough syrup.

## 2017-06-08 ENCOUNTER — Telehealth: Payer: Self-pay | Admitting: Family Medicine

## 2017-06-08 NOTE — Telephone Encounter (Signed)
Hi Patsy. This was called into [facility] yesterday. Thank you.

## 2017-06-08 NOTE — Telephone Encounter (Signed)
Pt called and wanted to know why the cough syrup was not called in to pharmacy [phone].

## 2017-06-08 NOTE — Telephone Encounter (Signed)
Spoke with pt. Advised her that the cough syrup was sent to the correct pharmacy.

## 2017-06-13 ENCOUNTER — Encounter (HOSPITAL_COMMUNITY): Payer: Self-pay | Admitting: Emergency Medicine

## 2017-06-13 ENCOUNTER — Emergency Department (HOSPITAL_COMMUNITY)
Admission: EM | Admit: 2017-06-13 | Discharge: 2017-06-14 | Disposition: A | Discharge status: Home / Self Care | Payer: BLUE CROSS/BLUE SHIELD | Attending: Emergency Medicine | Admitting: Emergency Medicine

## 2017-06-13 ENCOUNTER — Other Ambulatory Visit: Payer: Self-pay

## 2017-06-13 DIAGNOSIS — J4520 Mild intermittent asthma, uncomplicated: Secondary | ICD-10-CM | POA: Diagnosis not present

## 2017-06-13 DIAGNOSIS — Z79899 Other long term (current) drug therapy: Secondary | ICD-10-CM | POA: Insufficient documentation

## 2017-06-13 DIAGNOSIS — Y999 Unspecified external cause status: Secondary | ICD-10-CM | POA: Insufficient documentation

## 2017-06-13 DIAGNOSIS — X781XXA Intentional self-harm by knife, initial encounter: Secondary | ICD-10-CM | POA: Insufficient documentation

## 2017-06-13 DIAGNOSIS — Y9389 Activity, other specified: Secondary | ICD-10-CM | POA: Insufficient documentation

## 2017-06-13 DIAGNOSIS — S91012A Laceration without foreign body, left ankle, initial encounter: Secondary | ICD-10-CM | POA: Insufficient documentation

## 2017-06-13 DIAGNOSIS — S81812A Laceration without foreign body, left lower leg, initial encounter: Secondary | ICD-10-CM

## 2017-06-13 DIAGNOSIS — Y92009 Unspecified place in unspecified non-institutional (private) residence as the place of occurrence of the external cause: Secondary | ICD-10-CM | POA: Diagnosis not present

## 2017-06-13 NOTE — ED Triage Notes (Signed)
Pt states she has a hx of cutting and tonight she cut her left ankle with a knife  Pt state she was not trying to kill herself  Pt states she has not cut in 2.5 mths  Pt admits to drinking alcohol tonight  Pt is currently undergoing psychiatric care and is doing as she is supposed to as far as taking her medication and keeping her appointments

## 2017-06-14 MED ORDER — LIDOCAINE-EPINEPHRINE (PF) 2 %-1:200000 IJ SOLN
10.0000 mL | Freq: Once | INTRAMUSCULAR | Status: AC
  Administered 2017-06-14: 10 mL
  Filled 2017-06-14: qty 20

## 2017-06-14 NOTE — ED Notes (Signed)
Pt admits to cutting herself with a knife, see is seen Amado Coe at [facility] who is her therapist

## 2017-06-14 NOTE — Discharge Instructions (Signed)
See your doctor for suture removal in 7 days. Go sooner with any sign of infection - fever, increasing pain or redness, drainage from the wound.
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
## 2017-06-23 ENCOUNTER — Ambulatory Visit: Payer: BLUE CROSS/BLUE SHIELD | Admitting: Nurse Practitioner

## 2017-06-26 ENCOUNTER — Ambulatory Visit: Payer: BLUE CROSS/BLUE SHIELD | Admitting: Obstetrics & Gynecology

## 2017-06-29 ENCOUNTER — Encounter: Payer: Self-pay | Admitting: Obstetrics and Gynecology

## 2017-06-29 ENCOUNTER — Other Ambulatory Visit (HOSPITAL_COMMUNITY)
Admission: RE | Admit: 2017-06-29 | Discharge: 2017-06-29 | Disposition: A | Discharge status: Home / Self Care | Payer: BLUE CROSS/BLUE SHIELD | Source: Ambulatory Visit | Attending: Obstetrics and Gynecology | Admitting: Obstetrics and Gynecology

## 2017-06-29 ENCOUNTER — Ambulatory Visit: Payer: BLUE CROSS/BLUE SHIELD | Admitting: Obstetrics and Gynecology

## 2017-06-29 VITALS — BP 98/60 | HR 90 | Resp 16 | Ht 64.5 in | Wt 118.8 lb

## 2017-06-29 DIAGNOSIS — Z1231 Encounter for screening mammogram for malignant neoplasm of breast: Secondary | ICD-10-CM

## 2017-06-29 DIAGNOSIS — Z01419 Encounter for gynecological examination (general) (routine) without abnormal findings: Secondary | ICD-10-CM | POA: Diagnosis not present

## 2017-06-29 DIAGNOSIS — Z113 Encounter for screening for infections with a predominantly sexual mode of transmission: Secondary | ICD-10-CM

## 2017-06-29 MED ORDER — TRIAMCINOLONE ACETONIDE 0.025 % EX OINT: 1.0000 "application " | TOPICAL_OINTMENT | Freq: Two times a day (BID) | CUTANEOUS | 0 refills | Status: DC

## 2017-06-29 NOTE — Progress Notes (Signed)
Patient scheduled while in office for  3D bilateral screening MMG with implants. Spoke with Archie Patten at [facility]. Patient is scheduled for 07/18/17 arriving at 2:30pm for 2:50pm appointment. Patient verbalizes understanding and is agreeable.
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
## 2017-06-29 NOTE — Patient Instructions (Signed)

## 2017-06-30 LAB — CYTOLOGY - PAP
Bacterial vaginitis: NEGATIVE
CANDIDA VAGINITIS: NEGATIVE
CHLAMYDIA, DNA PROBE: NEGATIVE
DIAGNOSIS: NEGATIVE
HPV: NOT DETECTED
Neisseria Gonorrhea: NEGATIVE
Trichomonas: NEGATIVE

## 2017-06-30 LAB — LIPID PANEL
CHOLESTEROL TOTAL: 169 mg/dL (ref 100–199)
Chol/HDL Ratio: 2.4 ratio (ref 0.0–4.4)
HDL: 71 mg/dL (ref >39)
LDL CALC: 74 mg/dL (ref 0–99)
Triglycerides: 121 mg/dL (ref 0–149)
VLDL Cholesterol Cal: 24 mg/dL (ref 5–40)

## 2017-06-30 LAB — VITAMIN D 25 HYDROXY (VIT D DEFICIENCY, FRACTURES): Vit D, 25-Hydroxy: 63.1 ng/mL (ref 30.0–100.0)

## 2017-06-30 LAB — HEP, RPR, HIV PANEL
HIV SCREEN 4TH GENERATION: NONREACTIVE
Hepatitis B Surface Ag: NEGATIVE
RPR Ser Ql: NONREACTIVE

## 2017-06-30 LAB — HEPATITIS C ANTIBODY: HEP C VIRUS AB: 0.2 {s_co_ratio} (ref 0.0–0.9)

## 2017-07-03 ENCOUNTER — Telehealth: Payer: Self-pay

## 2017-07-03 NOTE — Telephone Encounter (Signed)
Please have her complete her mammogram and then I can prescribe the Vagifem once results are back and normal.

## 2017-07-03 NOTE — Telephone Encounter (Signed)
-----   Message from Patton Salles, MD sent at 07/01/2017  5:35 AM EST ----- Please inform patient of results. Pap and HR HPV negative.  Needs recall - 02. Testing for infectious disease is all negative for HIV, syphilis, hepatitis B and C, trichomonas, gonorrhea, and chlamydia.  Vaginitis testing is also negative for BV and yeast.  Her cholesterol panel is normal and looks really good! Vit D level is normal at 63.1

## 2017-07-03 NOTE — Telephone Encounter (Signed)
Spoke with patient. Results given. Patient verbalizes understanding. States that she spoke with Dr.Silva about starting an estrogen tablet for vaginal discharge if all tests returned negative. Asking if she can start this at this time? Advised will review with Dr.Silva and return call.

## 2017-07-04 NOTE — Telephone Encounter (Signed)
Spoke with patient. Advised of message as seen below from Dr.Silva. Patient verbalizes understanding.  Routing to provider for final review. Patient agreeable to disposition. Will close encounter.  

## 2017-07-11 ENCOUNTER — Telehealth: Payer: Self-pay | Admitting: Allergy

## 2017-07-11 ENCOUNTER — Other Ambulatory Visit: Payer: Self-pay | Admitting: Allergy

## 2017-07-11 MED ORDER — ALBUTEROL SULFATE HFA 108 (90 BASE) MCG/ACT IN AERS: 2.0000 | INHALATION_SPRAY | RESPIRATORY_TRACT | 3 refills | Status: DC | PRN

## 2017-07-11 NOTE — Telephone Encounter (Signed)
Can you please change her to ProAir 2 puffs every 4 hours as needed for cough, wheeze, or shortness of breath. Thank you

## 2017-07-11 NOTE — Telephone Encounter (Signed)
Erika Mccoy, Insurance will not cover Proventil after April 1st. They will cover Pro-Air, or Ventolin. Please advise.

## 2017-07-11 NOTE — Telephone Encounter (Signed)
Changed to Pro-Air. Informed patient.

## 2017-07-14 ENCOUNTER — Other Ambulatory Visit: Payer: Self-pay | Admitting: Obstetrics & Gynecology

## 2017-07-14 ENCOUNTER — Telehealth: Payer: Self-pay | Admitting: Obstetrics and Gynecology

## 2017-07-14 MED ORDER — VALACYCLOVIR HCL 500 MG PO TABS: 500.0000 mg | ORAL_TABLET | Freq: Two times a day (BID) | ORAL | 1 refills | Status: DC

## 2017-07-14 NOTE — Telephone Encounter (Signed)
Spoke with patient, requesting RX for valacyclovir. Is not in a location she can discuss symptoms in detail, reports discomfort, outbreak this morning. Hx of HSV 2. Has tried valacyclovir in the past, experienced GI symptoms, request to try again. Verified pharmacy on file. Will review with Dr. Hyacinth Meeker and return call. Patient agreeable.   Dr. Hyacinth Meeker  -please advise on RX.

## 2017-07-14 NOTE — Telephone Encounter (Signed)
Spoke with patient, advised as seen below per Dr. Miller. Patient verbalizes understanding and is agreeable. Will close encounter.  

## 2017-07-14 NOTE — Telephone Encounter (Signed)
Patient is calling to speak with nurse about getting a prescription.

## 2017-07-14 NOTE — Telephone Encounter (Signed)
Rx for valtrex 500mg  bid x 3 days sent to pharmacy on file.

## 2017-07-18 ENCOUNTER — Ambulatory Visit
Admission: RE | Admit: 2017-07-18 | Discharge: 2017-07-18 | Disposition: A | Discharge status: Home / Self Care | Payer: BLUE CROSS/BLUE SHIELD | Source: Ambulatory Visit | Attending: Obstetrics and Gynecology | Admitting: Obstetrics and Gynecology

## 2017-07-18 DIAGNOSIS — Z1231 Encounter for screening mammogram for malignant neoplasm of breast: Secondary | ICD-10-CM

## 2017-07-31 ENCOUNTER — Other Ambulatory Visit: Payer: Self-pay

## 2017-07-31 ENCOUNTER — Encounter (HOSPITAL_COMMUNITY): Payer: Self-pay

## 2017-07-31 ENCOUNTER — Emergency Department (HOSPITAL_COMMUNITY)
Admission: EM | Admit: 2017-07-31 | Discharge: 2017-08-01 | Disposition: A | Discharge status: Home / Self Care | Payer: BLUE CROSS/BLUE SHIELD | Attending: Emergency Medicine | Admitting: Emergency Medicine

## 2017-07-31 DIAGNOSIS — S91012A Laceration without foreign body, left ankle, initial encounter: Secondary | ICD-10-CM | POA: Insufficient documentation

## 2017-07-31 DIAGNOSIS — Y999 Unspecified external cause status: Secondary | ICD-10-CM | POA: Diagnosis not present

## 2017-07-31 DIAGNOSIS — X58XXXA Exposure to other specified factors, initial encounter: Secondary | ICD-10-CM | POA: Diagnosis not present

## 2017-07-31 DIAGNOSIS — Z5321 Procedure and treatment not carried out due to patient leaving prior to being seen by health care provider: Secondary | ICD-10-CM | POA: Insufficient documentation

## 2017-07-31 DIAGNOSIS — Y929 Unspecified place or not applicable: Secondary | ICD-10-CM | POA: Diagnosis not present

## 2017-07-31 DIAGNOSIS — Y939 Activity, unspecified: Secondary | ICD-10-CM | POA: Diagnosis not present

## 2017-07-31 NOTE — ED Triage Notes (Signed)
Pt presents today with a self-inflicted laceration on her left ankle by a knife. Laceration is about 3 inches long.

## 2017-07-31 NOTE — ED Notes (Addendum)
Wound care performed on pt left ankle. Wet gauze wrap applied to leg. Patient states she sees therapist regarding cutting herself. Patient states she has been dealing with this for 16+ years and is not having thoughts of suicide.

## 2017-07-31 NOTE — ED Triage Notes (Signed)
CALLED PT FROM WAITING RM FOR RECHECK OF V/S 1ST ATTEMPT NO RESPONSE

## 2017-08-01 NOTE — ED Notes (Signed)
Called for room no answer

## 2017-08-07 ENCOUNTER — Telehealth: Payer: Self-pay | Admitting: Obstetrics and Gynecology

## 2017-08-07 MED ORDER — ESTRADIOL-NORETHINDRONE ACET 0.5-0.1 MG PO TABS: 1.0000 | ORAL_TABLET | Freq: Every day | ORAL | 11 refills | Status: DC

## 2017-08-07 MED ORDER — ESTRADIOL 10 MCG VA TABS: 10.0000 ug | ORAL_TABLET | VAGINAL | 11 refills | Status: DC

## 2017-08-07 NOTE — Telephone Encounter (Signed)
Last AEX 06/29/17 Dr. Edward Jolly Next: not scheduled  Last MMG 07/18/17, Birad 1: negative, screening MMG 1 yr.     Dr. Edward Jolly -ok to send RX as seen below?    1. Lopreeza 1tab po daily #28/11RF  2. vagifem 10 mcg vaginal tab, pv twice weekly #8/11RF

## 2017-08-07 NOTE — Telephone Encounter (Signed)
Patient left a message at 1pm, stating that she needed HRT and estrogen called into the [facility] at [address].

## 2017-08-07 NOTE — Telephone Encounter (Signed)
OK to refill Lopreeza and Vagifem, each for one year.

## 2017-08-07 NOTE — Telephone Encounter (Signed)
Spoke with patient, advised as seen below per Dr. Gerda Diss for Lopreeza and vagifem to verified pharmacy. Patient verbalizes understanding and is agreeable, will close encounter.

## 2017-08-14 ENCOUNTER — Telehealth: Payer: Self-pay | Admitting: Obstetrics and Gynecology

## 2017-08-14 NOTE — Telephone Encounter (Signed)
Spoke with patient. States she picked up "estradiol-norethindrone 0.5- 0.1" from pharmacy, was unsure if this is what she was taking previously, looks different.   Advised patient that is generic of Lopreeza, no med changes, this depends on pharmacy medication availability, manufacture changes or what is covered by your insurance provider. Follow-up with pharmacy for Lopreeza availability before filling next Rx if preferred. Patient verbalizes understanding.   Routing to provider for final review. Patient is agreeable to disposition. Will close encounter.

## 2017-08-14 NOTE — Telephone Encounter (Signed)
Patient called and said her prescription for estradiol/norethindrone is not the same as she was taking before. She'd like to speak with the nurse.

## 2017-09-18 ENCOUNTER — Telehealth: Payer: Self-pay | Admitting: Obstetrics and Gynecology

## 2017-09-18 MED ORDER — VALACYCLOVIR HCL 500 MG PO TABS: 500.0000 mg | ORAL_TABLET | Freq: Every day | ORAL | 4 refills | Status: DC

## 2017-09-18 NOTE — Telephone Encounter (Signed)
Patient called requesting to start "supressive therapy" and is also needs refills for Valtrex. Pharmacy on file confirmed.

## 2017-09-18 NOTE — Telephone Encounter (Signed)
Left message to call Teasia Zapf at [phone].  

## 2017-09-18 NOTE — Telephone Encounter (Signed)
OK to send in rx for valtrex 500mg  daily.  #90/4RF.

## 2017-09-18 NOTE — Telephone Encounter (Signed)
Spoke with patient. Hx HSV 2. Reports current HSV outbreak, not resolved after Vatrex 500mg  bid x3 days. States she rarely has outbreaks, have been more frequent in the last 3 months. 3 days of Valtrex has helped, but lesions have not resolved. Patient requesting new RX and to start suppression therapy if possible.   Confirmed pharmacy on file: [facility].   Advised will review with Dr. Hyacinth Meeker and return call.   Dr. Hyacinth Meeker -please advise on Valtrex Rx.

## 2017-09-18 NOTE — Telephone Encounter (Signed)
Spoke with patient, advised as seen below. Valtrex RX to pharmacy. Patient verbalizes understanding and is agreeable. Will close encounter.

## 2017-09-19 ENCOUNTER — Ambulatory Visit: Payer: BLUE CROSS/BLUE SHIELD | Admitting: Obstetrics & Gynecology

## 2017-09-19 ENCOUNTER — Telehealth: Payer: Self-pay | Admitting: Obstetrics & Gynecology

## 2017-09-19 NOTE — Telephone Encounter (Signed)
Patient called requesting an appointment for a possible urinary tract infection.

## 2017-09-19 NOTE — Telephone Encounter (Signed)
Spoke with patient. Patient states that over the weekend she began having pain with urination. Increased her water intake and symptoms persisted. Patient is now having pain constantly. Began having back pain yesterday. Denies fever or chills. Advised will need to be seen in office for evaluation. Appointment scheduled for 5/7 at 2:30 pm with Dr.Miller. Patient is agreeable to date and time.  Routing to provider for final review. Patient agreeable to disposition. Will close encounter.

## 2018-01-29 ENCOUNTER — Telehealth: Payer: Self-pay | Admitting: Obstetrics & Gynecology
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
## 2018-01-30 NOTE — Telephone Encounter (Signed)
Call to patient and voicemail message left, okay per designated party release form.  Advised of message from Dr. Hyacinth Meeker. Needs PCP or urgent care evaluation.  Call back prn with any questions.  Encounter closed.

## 2018-01-30 NOTE — Telephone Encounter (Signed)
If needs this type of medication, would likely benefit from urgent care evaluation.  Thanks.

## 2018-01-31 ENCOUNTER — Telehealth: Payer: Self-pay | Admitting: Obstetrics and Gynecology

## 2018-01-31 NOTE — Telephone Encounter (Signed)
Patient called to leave a message for nurse. Says "she took care of her pain yesterday by smoking marijuana and she will not ask for help again." she wanted the office and Dr Hyacinth Meeker to know. Asked if she wanted a nurse to call her back and she did not.

## 2018-01-31 NOTE — Telephone Encounter (Signed)
Reviewed message from patient.  Per patient request will not call back.  Routing to Dr. Hyacinth Meeker.

## 2018-02-05 ENCOUNTER — Telehealth: Payer: Self-pay | Admitting: Obstetrics & Gynecology

## 2018-02-05 NOTE — Telephone Encounter (Signed)
VOID

## 2018-02-14 ENCOUNTER — Ambulatory Visit: Payer: BLUE CROSS/BLUE SHIELD | Admitting: Psychiatry

## 2018-02-15 ENCOUNTER — Encounter: Payer: Self-pay | Admitting: Psychiatry

## 2018-02-15 NOTE — Progress Notes (Signed)
Failed to show for 2pm 10/2 without notice.  Follows recent flare-ups in anxiety, anger, and autoimmune dz.

## 2018-02-19 ENCOUNTER — Ambulatory Visit: Payer: BLUE CROSS/BLUE SHIELD | Admitting: Obstetrics & Gynecology

## 2018-02-19 ENCOUNTER — Telehealth: Payer: Self-pay | Admitting: Obstetrics & Gynecology

## 2018-02-19 NOTE — Telephone Encounter (Signed)
Patient called and cancelled her appointment for today with Dr. Hyacinth Meeker for STD testing due to being up all night vomiting. She said she will call back to reschedule when she is feeling better. Routing to provider for FYI.

## 2018-02-20 ENCOUNTER — Telehealth: Payer: Self-pay | Admitting: Obstetrics & Gynecology
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
## 2018-02-20 NOTE — Telephone Encounter (Signed)
Patient would like a referral to a pain specialist.

## 2018-03-06 ENCOUNTER — Ambulatory Visit: Payer: Self-pay | Admitting: Psychiatry

## 2018-03-14 ENCOUNTER — Ambulatory Visit (INDEPENDENT_AMBULATORY_CARE_PROVIDER_SITE_OTHER): Payer: BLUE CROSS/BLUE SHIELD | Admitting: Psychiatry

## 2018-03-14 DIAGNOSIS — F411 Generalized anxiety disorder: Secondary | ICD-10-CM

## 2018-03-14 DIAGNOSIS — F4010 Social phobia, unspecified: Secondary | ICD-10-CM

## 2018-03-14 DIAGNOSIS — F3130 Bipolar disorder, current episode depressed, mild or moderate severity, unspecified: Secondary | ICD-10-CM | POA: Diagnosis not present

## 2018-03-14 DIAGNOSIS — F4310 Post-traumatic stress disorder, unspecified: Secondary | ICD-10-CM

## 2018-03-14 DIAGNOSIS — F902 Attention-deficit hyperactivity disorder, combined type: Secondary | ICD-10-CM
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
## 2018-03-21 ENCOUNTER — Ambulatory Visit: Payer: Self-pay | Admitting: Psychiatry

## 2018-03-21 ENCOUNTER — Encounter: Payer: Self-pay | Admitting: Psychiatry

## 2018-03-21 ENCOUNTER — Other Ambulatory Visit: Payer: Self-pay | Admitting: Obstetrics & Gynecology

## 2018-03-21 NOTE — Telephone Encounter (Signed)
Patient wants to start back on her HRT medicine. She is using [facility] on [address].

## 2018-03-21 NOTE — Progress Notes (Signed)
Short-notice cancellation today, called at opening re. 11am appointment citing UTI.  No charge, professional courtesy, RS as able.

## 2018-03-21 NOTE — Telephone Encounter (Signed)
Medication refill request: Estradiol-Norethindrone  Last AEX:  07/09/17 Next AEX: nothing scheduled at this time Last MMG (if hormonal medication request): 07/18/17 Bi-rads 1 Neg  Refill authorized: #[id number] with 4RF

## 2018-03-23 NOTE — Telephone Encounter (Signed)
Patient is asking for the status of her refill request?  °

## 2018-03-28 ENCOUNTER — Ambulatory Visit: Payer: Self-pay | Admitting: Psychiatry

## 2018-04-04 ENCOUNTER — Ambulatory Visit (INDEPENDENT_AMBULATORY_CARE_PROVIDER_SITE_OTHER): Payer: BLUE CROSS/BLUE SHIELD | Admitting: Psychiatry

## 2018-04-04 DIAGNOSIS — F3130 Bipolar disorder, current episode depressed, mild or moderate severity, unspecified: Secondary | ICD-10-CM | POA: Diagnosis not present

## 2018-04-04 DIAGNOSIS — F4310 Post-traumatic stress disorder, unspecified: Secondary | ICD-10-CM | POA: Diagnosis not present

## 2018-04-04 DIAGNOSIS — F411 Generalized anxiety disorder: Secondary | ICD-10-CM | POA: Diagnosis not present

## 2018-04-04 NOTE — Progress Notes (Signed)
Psychotherapy Progress Note -- [name], PhD, [facility]  Patient ID: Erika Mccoy     MRN: [id number]     Date: 04/04/2018     Treatment Type: Individual psychotherapy Start: 2:10p Stop: 3:20p Time Spent: 70 min Accompanied by: none  Self-Report (interim history, self-report of stressors and symptoms, application of prior therapy, status changes) Psychiatry started 30mg  Adderall XR, which is helping her have the energy to get up, get out of the house more.  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  Has also seen him deal more effectively with his gambling problem.  Been dealing with seasonal allergies.  Has tried to reduce trazodone and hydroxyzine, also drinking much less.  Cocaine has been some use past 8 months.  Bipolar history includes hallucinations in 2004, now attributed to steroids for then-suspected MS.  No hallucinations since  Daily fibromyalgia flareups after 3pm if active.  Alcohol more than 1 drink only once a week, out, with [name], and designated driver Benedetto Goad).  Drinks and uses some cocaine with [name].    Looked into ketamine therapy -- not covered by insurance, allegedly.  Tired of being in pain and fighting dysphoria.    Insight why still angry with Tawanna Cooler -- faults his abandonment for impairing her as a mother, as well as it putting such pressure on her to comfort the kids and herself in loss of their situation and stability.  Recognizing how limited her understanding of love has been until now.  Generally, sees [name] as truly better than all predecessors, more true and considerate to her than any of them, and ultimately more honest and fair, despite history of gambling and episodes of possessive jealousy under the influence.  Dismayed, irritated to see daughter keeping alcholol in her purse while doing Adderall and LSD.  Therapies used: Solution-Oriented/Positive Psychology,  motivational  Intervention notes: Optician, dispensing with alcohol and drugs.  Cautioned against cocaine, both as a Bipolar activator and as the same kind of judgment she faults her daughter for.  Validated insight with ex-husband, encouraged fair communiction with BF.  Validated concerns with daughter, clear from history she will in no way be "managed" by PT, so best be quick to detach from any impulse to grab or command her attention, get an agreement to talk before trying to make points.  Discussed alternative therapies, including TMS and ECT, if her goal is to try to reset brain function without resorting to strong chemistry.  Free to investigate, given contact for [facility] if desired.  Mental Status/Observations:     Appearance:   Casual     Behavior:  Appropriate  Motor:  Normal and except for pain behavior  Speech/Language:   Clear and Coherent  Affect:  Appropriate  Mood:  irritable and with subject  Thought process:  tangential and able to center  Thought content:    WNL and multiple points of worry and/or offense  Sensory/Perceptual disturbances:    WNL  Orientation:  WNL  Attention:  Good  Concentration:  Fair  Memory:  grossly intact  Fund of knowledge:   Good  Insight:    Good  Judgment:   Fair  Impulse Control:  Fair   Risk Assessment: Danger to Self:  No Self-injurious Behavior: No Danger to Others: No Duty to Warn:no Physical Aggression / Violence:No  Access to Firearms a concern: No   Diagnosis:   ICD-10-CM   1. Bipolar I  disorder, most recent episode depressed (HCC) F31.30   2. PTSD (post-traumatic stress disorder) F43.10   3. Generalized anxiety disorder F41.1     Progress rating:  stable  Plan:  . Continue to restrain alcohol and drug use . Continue to utilize previously learned skills ad lib . Maintain medication, if prescribed, and work faithfully with relevant prescriber(s) . Call the clinic on-call service, present to ER, or call 911 if  any life-threatening emergency . Follow up with me in about 2 weeks, earlier if needed  Robley Fries, PhD

## 2018-04-11 ENCOUNTER — Ambulatory Visit (INDEPENDENT_AMBULATORY_CARE_PROVIDER_SITE_OTHER): Payer: BLUE CROSS/BLUE SHIELD | Admitting: Psychiatry

## 2018-04-11 DIAGNOSIS — F4310 Post-traumatic stress disorder, unspecified: Secondary | ICD-10-CM | POA: Diagnosis not present

## 2018-04-11 DIAGNOSIS — F3130 Bipolar disorder, current episode depressed, mild or moderate severity, unspecified: Secondary | ICD-10-CM

## 2018-04-11 DIAGNOSIS — F1910 Other psychoactive substance abuse, uncomplicated: Secondary | ICD-10-CM | POA: Diagnosis not present

## 2018-04-11 DIAGNOSIS — F411 Generalized anxiety disorder: Secondary | ICD-10-CM
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
## 2018-04-13 ENCOUNTER — Emergency Department (HOSPITAL_COMMUNITY): Payer: BLUE CROSS/BLUE SHIELD

## 2018-04-13 ENCOUNTER — Other Ambulatory Visit: Payer: Self-pay

## 2018-04-13 ENCOUNTER — Emergency Department (HOSPITAL_COMMUNITY)
Admission: EM | Admit: 2018-04-13 | Discharge: 2018-04-13 | Disposition: A | Discharge status: Home / Self Care | Payer: BLUE CROSS/BLUE SHIELD | Attending: Emergency Medicine | Admitting: Emergency Medicine

## 2018-04-13 ENCOUNTER — Encounter (HOSPITAL_COMMUNITY): Payer: Self-pay | Admitting: Emergency Medicine

## 2018-04-13 DIAGNOSIS — W109XXA Fall (on) (from) unspecified stairs and steps, initial encounter: Secondary | ICD-10-CM | POA: Insufficient documentation

## 2018-04-13 DIAGNOSIS — M25512 Pain in left shoulder: Secondary | ICD-10-CM | POA: Diagnosis not present

## 2018-04-13 DIAGNOSIS — Z79899 Other long term (current) drug therapy: Secondary | ICD-10-CM | POA: Diagnosis not present

## 2018-04-13 DIAGNOSIS — Y939 Activity, unspecified: Secondary | ICD-10-CM | POA: Diagnosis not present

## 2018-04-13 DIAGNOSIS — Y929 Unspecified place or not applicable: Secondary | ICD-10-CM | POA: Diagnosis not present

## 2018-04-13 DIAGNOSIS — Y999 Unspecified external cause status: Secondary | ICD-10-CM | POA: Diagnosis not present

## 2018-04-13 DIAGNOSIS — M25552 Pain in left hip: Secondary | ICD-10-CM | POA: Insufficient documentation

## 2018-04-13 DIAGNOSIS — M542 Cervicalgia: Secondary | ICD-10-CM | POA: Insufficient documentation

## 2018-04-13 DIAGNOSIS — J45909 Unspecified asthma, uncomplicated: Secondary | ICD-10-CM | POA: Insufficient documentation

## 2018-04-13 DIAGNOSIS — T07XXXA Unspecified multiple injuries, initial encounter: Secondary | ICD-10-CM

## 2018-04-13 DIAGNOSIS — W19XXXA Unspecified fall, initial encounter: Secondary | ICD-10-CM

## 2018-04-13 LAB — COMPREHENSIVE METABOLIC PANEL
ALT: 17 U/L (ref 0–44)
AST: 20 U/L (ref 15–41)
Albumin: 4.1 g/dL (ref 3.5–5.0)
Alkaline Phosphatase: 53 U/L (ref 38–126)
Anion gap: 8 (ref 5–15)
BUN: 13 mg/dL (ref 6–20)
CO2: 26 mmol/L (ref 22–32)
CREATININE: 0.86 mg/dL (ref 0.44–1.00)
Calcium: 9 mg/dL (ref 8.9–10.3)
Chloride: 103 mmol/L (ref 98–111)
GFR calc Af Amer: >60 mL/min (ref >60)
GFR calc non Af Amer: >60 mL/min (ref >60)
Glucose, Bld: 111 mg/dL — ABNORMAL HIGH (ref 70–99)
Potassium: 4.1 mmol/L (ref 3.5–5.1)
Sodium: 137 mmol/L (ref 135–145)
Total Bilirubin: 1 mg/dL (ref 0.3–1.2)
Total Protein: 6.8 g/dL (ref 6.5–8.1)

## 2018-04-13 LAB — CBC WITH DIFFERENTIAL/PLATELET
Abs Immature Granulocytes: 0.03 10*3/uL (ref 0.00–0.07)
Basophils Absolute: 0 10*3/uL (ref 0.0–0.1)
Basophils Relative: 1 %
Eosinophils Absolute: 0.1 10*3/uL (ref 0.0–0.5)
Eosinophils Relative: 1 %
HCT: 38.4 % (ref 36.0–46.0)
Hemoglobin: 12.4 g/dL (ref 12.0–15.0)
Immature Granulocytes: 0 %
Lymphocytes Relative: 15 %
Lymphs Abs: 1.2 10*3/uL (ref 0.7–4.0)
MCH: 30.7 pg (ref 26.0–34.0)
MCHC: 32.3 g/dL (ref 30.0–36.0)
MCV: 95 fL (ref 80.0–100.0)
Monocytes Absolute: 0.6 10*3/uL (ref 0.1–1.0)
Monocytes Relative: 7 %
Neutro Abs: 5.8 10*3/uL (ref 1.7–7.7)
Neutrophils Relative %: 76 %
PLATELETS: 198 10*3/uL (ref 150–400)
RBC: 4.04 MIL/uL (ref 3.87–5.11)
RDW: 11.8 % (ref 11.5–15.5)
WBC: 7.7 10*3/uL (ref 4.0–10.5)
nRBC: 0 % (ref 0.0–0.2)

## 2018-04-13 LAB — I-STAT BETA HCG BLOOD, ED (MC, WL, AP ONLY): I-stat hCG, quantitative: <5 m[IU]/mL (ref <5)

## 2018-04-13 MED ORDER — ONDANSETRON HCL 4 MG/2ML IJ SOLN
4.0000 mg | Freq: Once | INTRAMUSCULAR | Status: AC
  Administered 2018-04-13: 4 mg via INTRAVENOUS
  Filled 2018-04-13: qty 2

## 2018-04-13 MED ORDER — METHOCARBAMOL 500 MG PO TABS: 500.0000 mg | ORAL_TABLET | Freq: Two times a day (BID) | ORAL | 0 refills | Status: DC | PRN

## 2018-04-13 MED ORDER — HYDROMORPHONE HCL 1 MG/ML IJ SOLN
1.0000 mg | Freq: Once | INTRAMUSCULAR | Status: AC
Start: 2018-04-13 — End: 2018-04-13
  Administered 2018-04-13: 1 mg via INTRAVENOUS
  Filled 2018-04-13: qty 1

## 2018-04-13 MED ORDER — IOPAMIDOL (ISOVUE-300) INJECTION 61%
100.0000 mL | Freq: Once | INTRAVENOUS | Status: AC | PRN
  Administered 2018-04-13: 100 mL via INTRAVENOUS

## 2018-04-13 MED ORDER — OXYCODONE-ACETAMINOPHEN 5-325 MG PO TABS
1.0000 | ORAL_TABLET | Freq: Once | ORAL | Status: AC
  Administered 2018-04-13: 1 via ORAL
  Filled 2018-04-13: qty 1

## 2018-04-13 NOTE — ED Triage Notes (Addendum)
Pt tripped resulting in fall down 13 stairs 20 PTA; complaint of neck, back, left hip, and left shoulder pain. C collar applied with triage.
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
## 2018-04-13 NOTE — ED Notes (Signed)
EDP notified of pt status and complaint.

## 2018-04-13 NOTE — ED Notes (Signed)
Patient transported to CT 
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
## 2018-04-13 NOTE — ED Provider Notes (Signed)
Patient care assumed at 1600. Patient here for evaluation of injuries following a mechanical fall, fell down 13 stairs. She does have contusions on examination. CT scans pending.  CT's are negative for acute abnormality. Discussed with patient incidental finding of pulmonary nodule. Discussed with patient home care for contusions. Recommend acetaminophen prn, heat, activity as tolerated.   Tilden Fossa, MD 04/13/18 [phone]

## 2018-04-16 ENCOUNTER — Telehealth: Payer: Self-pay | Admitting: Psychiatry

## 2018-04-16 NOTE — Telephone Encounter (Signed)
FYI - patient called expressing that she fell down stairs on Friday.  "In bad shape", can't get up or down or use toilet.  CA 12/4

## 2018-04-17 NOTE — Telephone Encounter (Signed)
Wow, so many mishaps for her.  Ok.  Please offer the spot to PT MRN# [id number] if possible.

## 2018-04-18 ENCOUNTER — Ambulatory Visit: Payer: Self-pay | Admitting: Psychiatry

## 2018-04-25 ENCOUNTER — Ambulatory Visit: Payer: Self-pay | Admitting: Psychiatry

## 2018-05-01 ENCOUNTER — Ambulatory Visit: Payer: BLUE CROSS/BLUE SHIELD | Admitting: Nurse Practitioner

## 2018-05-02 ENCOUNTER — Encounter: Payer: Self-pay | Admitting: Psychiatry

## 2018-05-02 ENCOUNTER — Ambulatory Visit (INDEPENDENT_AMBULATORY_CARE_PROVIDER_SITE_OTHER): Payer: BLUE CROSS/BLUE SHIELD | Admitting: Psychiatry

## 2018-05-02 DIAGNOSIS — F1210 Cannabis abuse, uncomplicated: Secondary | ICD-10-CM | POA: Diagnosis not present

## 2018-05-02 DIAGNOSIS — F3130 Bipolar disorder, current episode depressed, mild or moderate severity, unspecified: Secondary | ICD-10-CM | POA: Diagnosis not present

## 2018-05-02 DIAGNOSIS — F4310 Post-traumatic stress disorder, unspecified: Secondary | ICD-10-CM

## 2018-05-02 DIAGNOSIS — F1011 Alcohol abuse, in remission: Secondary | ICD-10-CM

## 2018-05-02 DIAGNOSIS — F4010 Social phobia, unspecified: Secondary | ICD-10-CM
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
## 2018-05-11 ENCOUNTER — Telehealth: Payer: Self-pay | Admitting: Obstetrics & Gynecology

## 2018-05-11 NOTE — Telephone Encounter (Signed)
Spoke with patient. Patient reports yellow, stringy, thick, vaginal d/c with odor. Possible peri-rectal tear causing intermittent discomfort. Patient states symptoms started 12/22. States she has had this tear before, tears with intercourse. Requesting STD testing.   OV scheduled for 12/30 at 3:30pm with Dr. Hyacinth Meeker.   Routing to provider for final review. Patient is agreeable to disposition. Will close encounter.

## 2018-05-11 NOTE — Telephone Encounter (Signed)
Patient is calling asking for a std screening and thinks she may have bacterial vaginosis.

## 2018-05-14 ENCOUNTER — Ambulatory Visit: Payer: BLUE CROSS/BLUE SHIELD | Admitting: Obstetrics & Gynecology

## 2018-05-14 VITALS — BP 118/62 | HR 68 | Resp 14 | Ht 65.0 in | Wt 125.6 lb

## 2018-05-14 DIAGNOSIS — N390 Urinary tract infection, site not specified: Secondary | ICD-10-CM

## 2018-05-14 DIAGNOSIS — Z113 Encounter for screening for infections with a predominantly sexual mode of transmission: Secondary | ICD-10-CM | POA: Diagnosis not present

## 2018-05-14 DIAGNOSIS — N898 Other specified noninflammatory disorders of vagina: Secondary | ICD-10-CM

## 2018-05-14 LAB — POCT URINALYSIS DIPSTICK
Bilirubin, UA: NEGATIVE
Blood, UA: NEGATIVE
Glucose, UA: NEGATIVE
Ketones, UA: NEGATIVE
Leukocytes, UA: NEGATIVE
NITRITE UA: NEGATIVE
PROTEIN UA: NEGATIVE
Urobilinogen, UA: NEGATIVE E.U./dL — AB
pH, UA: 5 (ref 5.0–8.0)

## 2018-05-14 MED ORDER — FLUCONAZOLE 150 MG PO TABS: 150.0000 mg | ORAL_TABLET | Freq: Once | ORAL | 0 refills | Status: AC

## 2018-05-14 MED ORDER — CLOBETASOL PROPIONATE 0.05 % EX OINT: 1.0000 "application " | TOPICAL_OINTMENT | Freq: Two times a day (BID) | CUTANEOUS | 0 refills | Status: DC
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
## 2018-05-15 ENCOUNTER — Ambulatory Visit: Payer: Self-pay | Admitting: Psychiatry

## 2018-05-15 ENCOUNTER — Encounter: Payer: Self-pay | Admitting: Obstetrics & Gynecology

## 2018-05-15 LAB — HEPATITIS C ANTIBODY: Hep C Virus Ab: 0.1 s/co ratio (ref 0.0–0.9)

## 2018-05-15 LAB — HEP, RPR, HIV PANEL
HIV Screen 4th Generation wRfx: NONREACTIVE
Hepatitis B Surface Ag: NEGATIVE
RPR Ser Ql: NONREACTIVE

## 2018-05-16 LAB — NUSWAB VAGINITIS PLUS (VG+)
Candida albicans, NAA: NEGATIVE
Candida glabrata, NAA: NEGATIVE
Chlamydia trachomatis, NAA: NEGATIVE
Neisseria gonorrhoeae, NAA: NEGATIVE
Trich vag by NAA: NEGATIVE

## 2018-05-16 LAB — URINE CULTURE: Organism ID, Bacteria: NO GROWTH

## 2018-05-18 ENCOUNTER — Ambulatory Visit: Payer: Self-pay | Admitting: Nurse Practitioner

## 2018-05-24 ENCOUNTER — Telehealth: Payer: Self-pay | Admitting: Obstetrics & Gynecology

## 2018-05-24 MED ORDER — METRONIDAZOLE 0.75 % VA GEL: 1.0000 | Freq: Every day | VAGINAL | 0 refills | Status: AC

## 2018-05-24 NOTE — Telephone Encounter (Signed)
Spoke with patient.  Does not have Rx for flagyl.  Message from Dr. Hyacinth Meeker discussed.   Would like vaginal treatment if possible due to gastritis.   Okay to order Metrogel.  Place one applicator full PV at hs for 5 nights.

## 2018-05-24 NOTE — Telephone Encounter (Signed)
Notes recorded by Blima Ledger, CMA on 05/18/2018 at 3:43 PM EST Left message for patient call back ------  Notes recorded by Jerene Bears, MD on 05/18/2018 at 2:24 PM EST Ok to treat with flagyl 500mg  bid x 7 days. If this persists, then the next step would be some vaginal estrogen. No ETOH with flagyl. ------  Notes recorded by Blima Ledger, CMA on 05/18/2018 at 2:23 PM EST Patient notified of result. She states that she is still having the symptoms. Advised patient I would let Dr. Hyacinth Meeker know. ------  Notes recorded by Jerene Bears, MD on 05/18/2018 at 5:34 AM EST Please let pt know her vaginitis testing was negative for yeast, BV, trich, GC and chlamydia. Urine culture was negative. Also, blood work was negative but she has already been informed of this. Please see if she is still having symptoms. Thanks.

## 2018-05-24 NOTE — Telephone Encounter (Signed)
Patient called and requested to speak with the nurse. She has some follow up questions about her last visit on 05/14/18.

## 2018-05-24 NOTE — Telephone Encounter (Signed)
Okay per Dr. Hyacinth Meeker to order Metrogel.  Order sent to pharmacy on file. Pt aware.

## 2018-05-31 ENCOUNTER — Ambulatory Visit: Payer: BLUE CROSS/BLUE SHIELD | Admitting: Psychiatry

## 2018-05-31 DIAGNOSIS — F411 Generalized anxiety disorder: Secondary | ICD-10-CM | POA: Diagnosis not present

## 2018-05-31 DIAGNOSIS — F3130 Bipolar disorder, current episode depressed, mild or moderate severity, unspecified: Secondary | ICD-10-CM | POA: Diagnosis not present

## 2018-05-31 DIAGNOSIS — Z62820 Parent-biological child conflict: Secondary | ICD-10-CM | POA: Diagnosis not present

## 2018-05-31 DIAGNOSIS — F4310 Post-traumatic stress disorder, unspecified: Secondary | ICD-10-CM | POA: Diagnosis not present

## 2018-05-31 DIAGNOSIS — F1011 Alcohol abuse, in remission: Secondary | ICD-10-CM
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
## 2018-06-14 ENCOUNTER — Ambulatory Visit: Payer: BLUE CROSS/BLUE SHIELD | Admitting: Psychiatry

## 2018-06-14 DIAGNOSIS — F3175 Bipolar disorder, in partial remission, most recent episode depressed: Secondary | ICD-10-CM

## 2018-06-14 DIAGNOSIS — F4310 Post-traumatic stress disorder, unspecified: Secondary | ICD-10-CM

## 2018-06-14 DIAGNOSIS — F411 Generalized anxiety disorder: Secondary | ICD-10-CM

## 2018-06-14 DIAGNOSIS — Z62820 Parent-biological child conflict: Secondary | ICD-10-CM
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
## 2018-06-20 ENCOUNTER — Other Ambulatory Visit: Payer: Self-pay | Admitting: Obstetrics and Gynecology

## 2018-06-20 ENCOUNTER — Ambulatory Visit: Payer: BLUE CROSS/BLUE SHIELD | Admitting: Psychiatry

## 2018-06-20 DIAGNOSIS — Z1231 Encounter for screening mammogram for malignant neoplasm of breast: Secondary | ICD-10-CM

## 2018-06-22 ENCOUNTER — Ambulatory Visit: Payer: BLUE CROSS/BLUE SHIELD | Admitting: Nurse Practitioner

## 2018-06-22 ENCOUNTER — Ambulatory Visit: Payer: Self-pay | Admitting: Nurse Practitioner

## 2018-07-04 ENCOUNTER — Ambulatory Visit: Payer: BLUE CROSS/BLUE SHIELD | Admitting: Psychiatry

## 2018-07-04 DIAGNOSIS — F411 Generalized anxiety disorder: Secondary | ICD-10-CM

## 2018-07-04 DIAGNOSIS — F3130 Bipolar disorder, current episode depressed, mild or moderate severity, unspecified: Secondary | ICD-10-CM | POA: Diagnosis not present

## 2018-07-04 DIAGNOSIS — Z630 Problems in relationship with spouse or partner: Secondary | ICD-10-CM

## 2018-07-04 DIAGNOSIS — F4010 Social phobia, unspecified: Secondary | ICD-10-CM

## 2018-07-04 DIAGNOSIS — F1210 Cannabis abuse, uncomplicated: Secondary | ICD-10-CM

## 2018-07-04 DIAGNOSIS — F5104 Psychophysiologic insomnia: Secondary | ICD-10-CM

## 2018-07-04 DIAGNOSIS — F1910 Other psychoactive substance abuse, uncomplicated: Secondary | ICD-10-CM | POA: Diagnosis not present

## 2018-07-04 DIAGNOSIS — F4310 Post-traumatic stress disorder, unspecified: Secondary | ICD-10-CM | POA: Diagnosis not present

## 2018-07-04 DIAGNOSIS — Z62820 Parent-biological child conflict: Secondary | ICD-10-CM
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
## 2018-07-20 ENCOUNTER — Ambulatory Visit
Admission: RE | Admit: 2018-07-20 | Discharge: 2018-07-20 | Disposition: A | Discharge status: Home / Self Care | Payer: BLUE CROSS/BLUE SHIELD | Source: Ambulatory Visit | Attending: Obstetrics and Gynecology | Admitting: Obstetrics and Gynecology

## 2018-07-20 DIAGNOSIS — Z1231 Encounter for screening mammogram for malignant neoplasm of breast: Secondary | ICD-10-CM

## 2018-07-25 ENCOUNTER — Ambulatory Visit: Payer: BLUE CROSS/BLUE SHIELD | Admitting: Psychiatry

## 2018-07-31 ENCOUNTER — Ambulatory Visit: Payer: BLUE CROSS/BLUE SHIELD | Admitting: Nurse Practitioner

## 2018-08-01 ENCOUNTER — Ambulatory Visit: Payer: BLUE CROSS/BLUE SHIELD | Admitting: Psychiatry

## 2018-08-08 ENCOUNTER — Ambulatory Visit: Payer: BLUE CROSS/BLUE SHIELD | Admitting: Psychiatry

## 2018-08-08 ENCOUNTER — Other Ambulatory Visit: Payer: Self-pay

## 2018-08-08 DIAGNOSIS — F4310 Post-traumatic stress disorder, unspecified: Secondary | ICD-10-CM

## 2018-08-08 DIAGNOSIS — F411 Generalized anxiety disorder: Secondary | ICD-10-CM

## 2018-08-08 DIAGNOSIS — Z62820 Parent-biological child conflict: Secondary | ICD-10-CM

## 2018-08-08 DIAGNOSIS — Z630 Problems in relationship with spouse or partner: Secondary | ICD-10-CM

## 2018-08-08 DIAGNOSIS — F3130 Bipolar disorder, current episode depressed, mild or moderate severity, unspecified: Secondary | ICD-10-CM

## 2018-08-08 DIAGNOSIS — Z636 Dependent relative needing care at home: Secondary | ICD-10-CM | POA: Diagnosis not present
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
## 2018-08-14 ENCOUNTER — Ambulatory Visit: Payer: BLUE CROSS/BLUE SHIELD | Admitting: Obstetrics and Gynecology

## 2018-08-14 ENCOUNTER — Encounter: Payer: Self-pay | Admitting: Obstetrics and Gynecology

## 2018-08-14 ENCOUNTER — Other Ambulatory Visit: Payer: Self-pay

## 2018-08-14 VITALS — BP 110/60 | HR 82 | Temp 98.1°F | Resp 16 | Ht 65.0 in | Wt 128.0 lb

## 2018-08-14 DIAGNOSIS — N941 Unspecified dyspareunia: Secondary | ICD-10-CM | POA: Diagnosis not present

## 2018-08-14 DIAGNOSIS — L900 Lichen sclerosus et atrophicus: Secondary | ICD-10-CM

## 2018-08-14 DIAGNOSIS — N760 Acute vaginitis: Secondary | ICD-10-CM | POA: Diagnosis not present

## 2018-08-14 MED ORDER — ESTRADIOL 10 MCG VA TABS: 1.0000 | ORAL_TABLET | VAGINAL | 0 refills | Status: DC
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
## 2018-08-15 ENCOUNTER — Telehealth: Payer: Self-pay

## 2018-08-15 LAB — VAGINITIS/VAGINOSIS, DNA PROBE
Candida Species: NEGATIVE
Gardnerella vaginalis: NEGATIVE
Trichomonas vaginosis: NEGATIVE

## 2018-08-15 NOTE — Telephone Encounter (Signed)
Left message to call Kaitlyn at [phone]. 

## 2018-08-15 NOTE — Telephone Encounter (Signed)
Patient returned call to Kaitlyn. °

## 2018-08-15 NOTE — Telephone Encounter (Signed)
-----   Message from Romualdo Bolk, MD sent at 08/15/2018 11:00 AM EDT ----- Please inform the patient that her vaginitis panel was negative for infection. If her symptoms don't improve with the measures we discussed yesterday she should call back.

## 2018-08-15 NOTE — Telephone Encounter (Signed)
Spoke with patient. Results given. Patient verbalizes understanding. Encounter closed. 

## 2018-08-24 ENCOUNTER — Ambulatory Visit: Payer: BLUE CROSS/BLUE SHIELD | Admitting: Obstetrics and Gynecology

## 2018-08-24 ENCOUNTER — Other Ambulatory Visit: Payer: Self-pay

## 2018-08-24 ENCOUNTER — Encounter: Payer: Self-pay | Admitting: Obstetrics and Gynecology

## 2018-08-24 VITALS — BP 118/78 | HR 68 | Temp 97.8°F | Resp 16 | Wt 127.0 lb

## 2018-08-24 DIAGNOSIS — R102 Pelvic and perineal pain: Secondary | ICD-10-CM

## 2018-08-24 DIAGNOSIS — N390 Urinary tract infection, site not specified: Secondary | ICD-10-CM

## 2018-08-24 DIAGNOSIS — N952 Postmenopausal atrophic vaginitis: Secondary | ICD-10-CM | POA: Diagnosis not present

## 2018-08-24 DIAGNOSIS — N9089 Other specified noninflammatory disorders of vulva and perineum: Secondary | ICD-10-CM

## 2018-08-24 LAB — POCT URINALYSIS DIPSTICK
Bilirubin, UA: NEGATIVE
Blood, UA: NEGATIVE
Glucose, UA: NEGATIVE
Ketones, UA: NEGATIVE
Leukocytes, UA: NEGATIVE
Nitrite, UA: NEGATIVE
Protein, UA: NEGATIVE
Urobilinogen, UA: NEGATIVE E.U./dL — AB
pH, UA: 5 (ref 5.0–8.0)

## 2018-08-24 MED ORDER — TRIAMCINOLONE ACETONIDE 0.5 % EX OINT: 1.0000 "application " | TOPICAL_OINTMENT | Freq: Two times a day (BID) | CUTANEOUS | 0 refills | Status: DC

## 2018-08-24 MED ORDER — ESTRADIOL 0.1 MG/GM VA CREA: TOPICAL_CREAM | VAGINAL | 0 refills | Status: DC
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
## 2018-08-25 LAB — URINE CULTURE: Organism ID, Bacteria: NO GROWTH

## 2018-08-29 ENCOUNTER — Other Ambulatory Visit: Payer: Self-pay

## 2018-08-29 ENCOUNTER — Encounter: Payer: Self-pay | Admitting: Psychiatry

## 2018-08-29 ENCOUNTER — Telehealth: Payer: Self-pay | Admitting: Obstetrics and Gynecology

## 2018-08-29 ENCOUNTER — Ambulatory Visit: Payer: BLUE CROSS/BLUE SHIELD | Admitting: Psychiatry

## 2018-08-29 DIAGNOSIS — F3130 Bipolar disorder, current episode depressed, mild or moderate severity, unspecified: Secondary | ICD-10-CM

## 2018-08-29 NOTE — Telephone Encounter (Signed)
Left message to call Kaitlyn at [phone]. 

## 2018-08-29 NOTE — Progress Notes (Signed)
Admin note for No-show or Short Notice Cancellation  Patient ID: KIMIYO GRILLI  MRN: [id number] DATE: 08/29/2018  No show for 1pm teletherapy appointment.  Attempted by phone, email, and text to establish contact without success.  Offer to reschedule promptly and waive fee.  No safety risks suggested or presumed.    Robley Fries, PhD

## 2018-08-29 NOTE — Telephone Encounter (Signed)
Patient is calling to schedule a biopsy.

## 2018-08-31 ENCOUNTER — Encounter: Payer: Self-pay | Admitting: Nurse Practitioner

## 2018-08-31 NOTE — Telephone Encounter (Signed)
Call to patient. Left message to call back to schedule vulvar biopsy.

## 2018-08-31 NOTE — Progress Notes (Signed)
Admin note for No-show or Short Notice Cancellation  Patient ID: Erika Mccoy  MRN: [id number] DATE: 08/29/2018  No show for 1pm appointment despite clerical note indicating she was understanding and intended to keep appointment by phone or video.  Video invitation email, multiple phone calls, and direct text all went unanswered.  Considered a no-show, for undetectable reasons, and separate documentation made.  Staff requested to mark as Noshow in the system, but encounter not closed in EHR, apparently due to there having been a Visit in Progress marker established.  Duplicate note here to code as a no-charge encounter.  PT to RS as able.  Robley Fries, PhD

## 2018-08-31 NOTE — Telephone Encounter (Signed)
Patient returned call. Appointment scheduled for 09-04-2018 with DR Edward Jolly. Encounter closed.

## 2018-09-04 ENCOUNTER — Encounter: Payer: Self-pay | Admitting: Obstetrics and Gynecology

## 2018-09-04 ENCOUNTER — Encounter: Payer: Self-pay | Admitting: Nurse Practitioner

## 2018-09-04 ENCOUNTER — Other Ambulatory Visit: Payer: Self-pay

## 2018-09-04 ENCOUNTER — Ambulatory Visit (INDEPENDENT_AMBULATORY_CARE_PROVIDER_SITE_OTHER): Payer: BC Managed Care – PPO | Admitting: Obstetrics and Gynecology

## 2018-09-04 ENCOUNTER — Ambulatory Visit (INDEPENDENT_AMBULATORY_CARE_PROVIDER_SITE_OTHER): Payer: BLUE CROSS/BLUE SHIELD | Admitting: Nurse Practitioner

## 2018-09-04 VITALS — BP 110/60 | HR 72 | Resp 12 | Ht 64.5 in | Wt 127.0 lb

## 2018-09-04 DIAGNOSIS — R918 Other nonspecific abnormal finding of lung field: Secondary | ICD-10-CM

## 2018-09-04 DIAGNOSIS — L900 Lichen sclerosus et atrophicus: Secondary | ICD-10-CM

## 2018-09-04 DIAGNOSIS — F411 Generalized anxiety disorder: Secondary | ICD-10-CM

## 2018-09-04 DIAGNOSIS — K581 Irritable bowel syndrome with constipation: Secondary | ICD-10-CM | POA: Diagnosis not present

## 2018-09-04 DIAGNOSIS — K219 Gastro-esophageal reflux disease without esophagitis: Secondary | ICD-10-CM | POA: Diagnosis not present

## 2018-09-04 DIAGNOSIS — N9089 Other specified noninflammatory disorders of vulva and perineum: Secondary | ICD-10-CM | POA: Diagnosis not present

## 2018-09-04 DIAGNOSIS — J301 Allergic rhinitis due to pollen: Secondary | ICD-10-CM

## 2018-09-04 DIAGNOSIS — F319 Bipolar disorder, unspecified: Secondary | ICD-10-CM

## 2018-09-04 DIAGNOSIS — E781 Pure hyperglyceridemia: Secondary | ICD-10-CM

## 2018-09-04 DIAGNOSIS — M797 Fibromyalgia: Secondary | ICD-10-CM

## 2018-09-04 DIAGNOSIS — N3010 Interstitial cystitis (chronic) without hematuria: Secondary | ICD-10-CM

## 2018-09-04 NOTE — Progress Notes (Signed)
  Subjective:     Patient ID: Erika Mccoy, female   DOB: 1965-09-15, 53 y.o.   MRN: [id number]  HPI  Pap Smear: 06-29-17 neg  Here for vulvar biopsy. Using clobetasol ointment but no biopsy noted in Epic regarding dx of lichen sclerosus. She had whitish change of the perineum noted at her last visit on 08/24/18.  Her bottom is feeling better since she is treating the perineum with her clobetasol.  Will try vaginal valium suppository and flexeril for IC flare.  She is doing instillations and taking Macrobid daily. Tx though [place].    States she has pulmonary nodules and needs to see a pulmonologist.   States she has burning from lidocaine ointment but can tolerate an injection.   Review of Systems  Constitutional: Negative.   HENT: Negative.   Eyes: Negative.   Respiratory: Negative.   Cardiovascular: Negative.   Gastrointestinal: Negative.   Endocrine: Negative.   Genitourinary: Negative.   Musculoskeletal: Negative.   Skin: Negative.   Allergic/Immunologic: Negative.   Neurological: Negative.   Hematological: Negative.   Psychiatric/Behavioral: Negative.     Contraception: Ablation Menopausal hormone therapy: estradiol-norethindrone 0.5-0.1mg   Last mammogram:  07-20-2018 category b density birads 1:neg     Objective:   Physical Exam Genitourinary:    Procedure - vulvar biopsy Consent for procedure.  Area with scar and minimal white change.  Periclitoral chite change to epithelium as well.  Sterile prep with betadine.  Local 1% lidocaine - lot [id number], exp 11/14/19. 3 mm punch biopsy done.  Tissue to pathology.  Silver nitrate to area.  Minimal EBL.  No complications.     Assessment:     Vulvar lesion.  Possible lichen sclerosus.  Use of clobetasol ointment.     Plan:     FU biopsy.  Post biopsy instructions to patient.  Already has Rx for clobetasol.  Tylenol or Advil for post biopsy discomfort.  After visit summary to patient.

## 2018-09-04 NOTE — Patient Instructions (Signed)
Vulva Biopsy, Care After  This sheet gives you information about how to care for yourself after your procedure. Your health care provider may also give you more specific instructions. If you have problems or questions, contact your health care provider.  What can I expect after the procedure?  After the procedure, it is common to have:   Slight bleeding from the biopsy site.   Discomfort at the biopsy site.  Follow these instructions at home:  Biopsy site care     Follow instructions from your health care provider about how to take care of your biopsy site. Make sure you:  ? Clean the area using water and mild soap twice a day or as told by your health care provider. Gently pat the area dry.  ? If you were prescribed an antibiotic ointment, apply it as told by your health care provider. Do not stop using the antibiotic even if your condition improves.  ? Take a warm water bath (sitz bath) as needed to help with pain and discomfort. A sitz bath is taken while you are sitting down. The water should only come up to your hips and should cover your buttocks.  ? Leave stitches (sutures), skin glue, or adhesive strips in place. These skin closures may need to stay in place for 2 weeks or longer. If adhesive strip edges start to loosen and curl up, you may trim the loose edges. Do not remove adhesive strips completely unless your health care provider tells you to do that.   Check your biopsy site every day for signs of infection. Check for:  ? More redness, swelling, or pain.  ? More fluid or blood.  ? Warmth.  ? Pus or a bad smell.   Do not rub the biopsy area after urinating. Gently pat the area dry or use a bottle filled with warm water (peri-bottle) to clean the area. Gently wipe from front to back.  Lifestyle   Wear loose, cotton underwear. Do not wear tight pants.   Do not use a tampon, douche, or put anything inside your vagina for at least 1 week or until your health care provider approves.   Do not have sex  for at least 1 week or until your health care provider approves.   Do not exercise, such as running or biking, until your health care provider approves.   Do not swim or use a hot tub until your health care provider approves. You may shower or take a sitz bath.  General instructions   Take over-the-counter and prescription medicines only as told by your health care provider.   Use a sanitary napkin until the bleeding stops.   Keep all follow-up visits as told by your health care provider. This is important.  Contact a health care provider if:   You have more redness, swelling, or pain around your biopsy site.   You have more fluid or blood coming from your biopsy site.   Your biopsy site feels warm to the touch.   Your pain is not controlled with medicine.  Get help right away if you have:   Heavy bleeding from the vulva.   Pus or a bad smell coming from your biopsy site.   A fever.   Lower abdominal pain.  Summary   After the procedure, it is common to have slight bleeding and discomfort at the biopsy site.   Follow instructions from your health care provider after your biopsy. Make sure you clean the area with   water and mild soap. Pat the area dry.   Take sitz baths as needed to help with pain and discomfort. Leave any sutures in place.   Check your biopsy site for signs of infection, which may include more redness, swelling, pain, fluid, or blood, or feeling warm to the touch.   Get help right away if you have heavy bleeding, a fever, pus or a bad smell, or pain in the lower abdomen.  This information is not intended to replace advice given to you by your health care provider. Make sure you discuss any questions you have with your health care provider.  Document Released: 04/18/2012 Document Revised: 11/02/2017 Document Reviewed: 11/02/2017  Elsevier Interactive Patient Education  2019 [employer]vier Inc[employer]
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
## 2018-09-09 ENCOUNTER — Encounter: Payer: Self-pay | Admitting: Nurse Practitioner

## 2018-09-10 ENCOUNTER — Ambulatory Visit: Payer: BLUE CROSS/BLUE SHIELD | Admitting: Psychiatry

## 2018-09-14 ENCOUNTER — Telehealth: Payer: Self-pay

## 2018-09-14 NOTE — Telephone Encounter (Signed)
Spoke with patient. Results given. Patient verbalizes understanding. Encounter closed. 

## 2018-09-14 NOTE — Telephone Encounter (Signed)
-----   Message from Patton Salles, MD sent at 09/11/2018  4:48 PM EDT ----- Please report results of vulvar biopsy to patient showing squamous hyperplasia (signs of chronic irritation). Lichen sclerosus was not detected.  No yeast infection was seen.  She can still use the clobetasol ointment twice daily for 2 weeks at a time if needed for irritation.

## 2018-09-17 ENCOUNTER — Other Ambulatory Visit: Payer: Self-pay | Admitting: Obstetrics & Gynecology

## 2018-09-18 NOTE — Telephone Encounter (Signed)
Medication refill request: estradiol-norinthdrone acet 0.5-0.1mg  Last AEX:  06-29-17 Next AEX: not scheduled Last MMG (if hormonal medication request): 07-20-2018 category b density birads 1:neg Refill authorized: please approve if appropriate

## 2018-09-24 ENCOUNTER — Institutional Professional Consult (permissible substitution): Payer: BLUE CROSS/BLUE SHIELD | Admitting: Pulmonary Disease

## 2018-10-17 ENCOUNTER — Telehealth: Payer: Self-pay | Admitting: Obstetrics and Gynecology

## 2018-10-17 ENCOUNTER — Other Ambulatory Visit: Payer: Self-pay | Admitting: Obstetrics and Gynecology

## 2018-10-17 NOTE — Telephone Encounter (Signed)
Patient calling regarding refill. States she has been out for a week and is "not feeling great at all."

## 2018-10-17 NOTE — Telephone Encounter (Signed)
Tried calling patient, no answer. Left VM to call me back. Prescription was sent to pharmacy 09/18/18 for #28 w/2 refills. Patient should check with pharmacy for refills

## 2018-10-17 NOTE — Telephone Encounter (Signed)
Patient is returning a call regarding her prescription. She stated she is completely out of her prescription.

## 2018-10-17 NOTE — Telephone Encounter (Signed)
Spoke with patient, see refill encounter 10/17/18. Prescription is at the pharmacy. Patient notified.

## 2018-11-21 ENCOUNTER — Encounter: Payer: Self-pay | Admitting: Nurse Practitioner

## 2018-11-22 ENCOUNTER — Other Ambulatory Visit: Payer: Self-pay | Admitting: Obstetrics & Gynecology

## 2018-11-22 MED ORDER — ESTRADIOL-NORETHINDRONE ACET 0.5-0.1 MG PO TABS: 1.0000 | ORAL_TABLET | Freq: Every day | ORAL | 0 refills | Status: DC

## 2018-11-22 NOTE — Telephone Encounter (Signed)
Patient needs refill on HRT. Has been out 2 days. Currently on vacation. Refill can be sent to [facility] [place], [place] [address].

## 2018-11-22 NOTE — Telephone Encounter (Signed)
Medication refill request: estradiol  Last AEX:  06/29/17 Next AEX: nothing scheduled patient states she will schedule when she returns.  Last MMG (if hormonal medication request): 07/20/18 Bi-rads 1 Neg  Refill authorized: #28 with 0 RF

## 2018-12-05 ENCOUNTER — Other Ambulatory Visit: Payer: Self-pay

## 2018-12-05 ENCOUNTER — Encounter: Payer: Self-pay | Admitting: Nurse Practitioner

## 2018-12-05 ENCOUNTER — Ambulatory Visit (INDEPENDENT_AMBULATORY_CARE_PROVIDER_SITE_OTHER): Payer: BC Managed Care – PPO | Admitting: Nurse Practitioner

## 2018-12-05 VITALS — BP 114/72 | HR 102 | Temp 98.5°F | Ht 65.0 in | Wt 131.0 lb

## 2018-12-05 DIAGNOSIS — F411 Generalized anxiety disorder: Secondary | ICD-10-CM

## 2018-12-05 DIAGNOSIS — K581 Irritable bowel syndrome with constipation: Secondary | ICD-10-CM | POA: Diagnosis not present

## 2018-12-05 DIAGNOSIS — Z0000 Encounter for general adult medical examination without abnormal findings: Secondary | ICD-10-CM | POA: Diagnosis not present

## 2018-12-05 DIAGNOSIS — M5442 Lumbago with sciatica, left side: Secondary | ICD-10-CM | POA: Diagnosis not present

## 2018-12-05 DIAGNOSIS — N3010 Interstitial cystitis (chronic) without hematuria: Secondary | ICD-10-CM

## 2018-12-05 DIAGNOSIS — G8929 Other chronic pain: Secondary | ICD-10-CM

## 2018-12-05 DIAGNOSIS — E781 Pure hyperglyceridemia: Secondary | ICD-10-CM
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
## 2018-12-05 NOTE — Patient Instructions (Signed)
Get These Tests  Blood Pressure- Have your blood pressure checked by your healthcare provider at least once a year.  Normal blood pressure is 120/80. Goal for most <140/90.  Weight- Have your body mass index (BMI) calculated to screen for obesity.  BMI is a measure of body fat based on height and weight.  You can calculate your own BMI at www.nhlbisupport.com/bmi/  Cholesterol- Have your cholesterol checked every year.  Diabetes- Have your blood sugar checked every year if you have high blood pressure, high cholesterol, a family history of diabetes or if you are overweight.  Pap Test - Have a pap test every 1 to 5 years if you have been sexually active.  If you are older than 65 and recent pap tests have been normal you may not need additional pap tests.  In addition, if you have had a hysterectomy  for benign disease additional pap tests are not necessary.  Mammogram-Yearly mammograms are essential for early detection of breast cancer  Screening for Colon Cancer- Colonoscopy starting at age 50. Screening may begin sooner depending on your family history and other health conditions.  Follow up colonoscopy as directed by your Gastroenterologist.  Screening for Osteoporosis- Screening begins at age 65 with bone density scanning, sooner if you are at higher risk for developing Osteoporosis.   Get these medicines  Calcium with Vitamin D- Your body requires 1200-1500 mg of Calcium a day and 800-1000 IU of Vitamin D a day.  You can only absorb 500 mg of Calcium at a time therefore Calcium must be taken in 2 or 3 separate doses throughout the day.  Hormones- Hormone therapy has been associated with increased risk for certain cancers and heart disease.  Talk to your healthcare provider about if you need relief from menopausal symptoms.  Aspirin- Ask your healthcare provider about taking Aspirin to prevent Heart Disease and Stroke.   Get these Immuniztions  Flu shot- Every fall  Pneumonia shot-  Once after the age of 65; if you are younger ask your healthcare provider if you need a pneumonia shot.  Tetanus- Every ten years.  Shingrix- Once after the age of 60 to prevent shingles.   Take these steps  Don't smoke- Your healthcare provider can help you quit. For tips on how to quit, ask your healthcare provider or go to www.smokefree.gov or call 1-800 QUIT-NOW.  Be physically active- Exercise 5 days a week for a minimum of 30 minutes.  If you are not already physically active, start slow and gradually work up to 30 minutes of moderate physical activity.  Try walking, dancing, bike riding, swimming, etc.  Eat a healthy diet- Eat a variety of healthy foods such as fruits, vegetables, whole grains, low fat milk, low fat cheeses, yogurt, lean meats, chicken, fish, eggs, dried beans, tofu, etc.  For more information go to www.thenutritionsource.org  Dental visit- Brush and floss teeth twice daily; visit your dentist twice a year.  Eye exam- Visit your Optometrist or Ophthalmologist yearly.  Drink alcohol in moderation- Limit alcohol intake to one drink or less a day.  Never drink and drive.  Depression- Your emotional health is as important as your physical health.  If you're feeling down or losing interest in things you normally enjoy, please talk to your healthcare provider.  Seat Belts- can save your life; always wear one  Smoke/Carbon Monoxide detectors- These detectors need to be installed on the appropriate level of your home.  Replace batteries at least once a year.    Violence- If anyone is threatening or hurting you, please tell your healthcare provider.  Living Will/ Health care power of attorney- Discuss with your healthcare provider and family.   

## 2018-12-07 ENCOUNTER — Other Ambulatory Visit: Payer: Self-pay

## 2018-12-07 ENCOUNTER — Telehealth: Payer: Self-pay | Admitting: Adult Health

## 2018-12-07 LAB — CBC WITH DIFFERENTIAL/PLATELET
Absolute Monocytes: 599 cells/uL (ref 200–950)
Basophils Absolute: 59 cells/uL (ref 0–200)
Basophils Relative: 0.8 %
Eosinophils Absolute: 104 cells/uL (ref 15–500)
Eosinophils Relative: 1.4 %
HCT: 37.6 % (ref 35.0–45.0)
Hemoglobin: 12.8 g/dL (ref 11.7–15.5)
Lymphs Abs: 1872 cells/uL (ref 850–3900)
MCH: 31.4 pg (ref 27.0–33.0)
MCHC: 34 g/dL (ref 32.0–36.0)
MCV: 92.2 fL (ref 80.0–100.0)
MPV: 9.9 fL (ref 7.5–12.5)
Monocytes Relative: 8.1 %
Neutro Abs: 4766 cells/uL (ref 1500–7800)
Neutrophils Relative %: 64.4 %
Platelets: 226 10*3/uL (ref 140–400)
RBC: 4.08 10*6/uL (ref 3.80–5.10)
RDW: 11.7 % (ref 11.0–15.0)
Total Lymphocyte: 25.3 %
WBC: 7.4 10*3/uL (ref 3.8–10.8)

## 2018-12-07 LAB — COMPLETE METABOLIC PANEL WITH GFR
AG Ratio: 1.7 (calc) (ref 1.0–2.5)
ALT: 13 U/L (ref 6–29)
AST: 14 U/L (ref 10–35)
Albumin: 4.2 g/dL (ref 3.6–5.1)
Alkaline phosphatase (APISO): 46 U/L (ref 37–153)
BUN: 20 mg/dL (ref 7–25)
CO2: 32 mmol/L (ref 20–32)
Calcium: 9.5 mg/dL (ref 8.6–10.4)
Chloride: 99 mmol/L (ref 98–110)
Creat: 0.85 mg/dL (ref 0.50–1.05)
GFR, Est African American: 91 mL/min/{1.73_m2} (ref >=60)
GFR, Est Non African American: 79 mL/min/{1.73_m2} (ref >=60)
Globulin: 2.5 g/dL (calc) (ref 1.9–3.7)
Glucose, Bld: 80 mg/dL (ref 65–139)
Potassium: 4.2 mmol/L (ref 3.5–5.3)
Sodium: 139 mmol/L (ref 135–146)
Total Bilirubin: 0.4 mg/dL (ref 0.2–1.2)
Total Protein: 6.7 g/dL (ref 6.1–8.1)

## 2018-12-07 LAB — URINE CULTURE
MICRO NUMBER:: 693559
SPECIMEN QUALITY:: ADEQUATE

## 2018-12-07 LAB — LIPID PANEL
Cholesterol: 208 mg/dL — ABNORMAL HIGH (ref <200)
HDL: 60 mg/dL (ref >=50)
LDL Cholesterol (Calc): 105 mg/dL (calc) — ABNORMAL HIGH
Non-HDL Cholesterol (Calc): 148 mg/dL (calc) — ABNORMAL HIGH (ref <130)
Total CHOL/HDL Ratio: 3.5 (calc) (ref <5.0)
Triglycerides: 324 mg/dL — ABNORMAL HIGH (ref <150)

## 2018-12-07 LAB — TSH: TSH: 1.99 mIU/L

## 2018-12-07 MED ORDER — AMOXICILLIN-POT CLAVULANATE 500-125 MG PO TABS: 1.0000 | ORAL_TABLET | Freq: Two times a day (BID) | ORAL | 0 refills | Status: DC

## 2018-12-07 NOTE — Telephone Encounter (Signed)
She said that she is experiencing urinary retention and urine has bad odor. Started on Augmentin 500 mg BID X 5 days. She said that she has taken this antibiotic 4 months ago and had no reactions. Instructed her to monitor for side effects and stop medications for side effects and call the office.

## 2018-12-10 ENCOUNTER — Ambulatory Visit (INDEPENDENT_AMBULATORY_CARE_PROVIDER_SITE_OTHER): Payer: BC Managed Care – PPO | Admitting: Psychiatry

## 2018-12-10 ENCOUNTER — Other Ambulatory Visit: Payer: Self-pay

## 2018-12-10 ENCOUNTER — Ambulatory Visit
Admission: RE | Admit: 2018-12-10 | Discharge: 2018-12-10 | Disposition: A | Discharge status: Home / Self Care | Payer: BC Managed Care – PPO | Source: Ambulatory Visit | Attending: Nurse Practitioner | Admitting: Nurse Practitioner

## 2018-12-10 DIAGNOSIS — M5442 Lumbago with sciatica, left side: Secondary | ICD-10-CM | POA: Diagnosis not present

## 2018-12-10 DIAGNOSIS — Z91410 Personal history of adult physical and sexual abuse: Secondary | ICD-10-CM

## 2018-12-10 DIAGNOSIS — Z87898 Personal history of other specified conditions: Secondary | ICD-10-CM

## 2018-12-10 DIAGNOSIS — G8929 Other chronic pain: Secondary | ICD-10-CM

## 2018-12-10 DIAGNOSIS — Z62810 Personal history of physical and sexual abuse in childhood: Secondary | ICD-10-CM

## 2018-12-10 DIAGNOSIS — N3010 Interstitial cystitis (chronic) without hematuria: Secondary | ICD-10-CM | POA: Diagnosis not present

## 2018-12-10 DIAGNOSIS — F3130 Bipolar disorder, current episode depressed, mild or moderate severity, unspecified: Secondary | ICD-10-CM

## 2018-12-10 DIAGNOSIS — Z636 Dependent relative needing care at home: Secondary | ICD-10-CM

## 2018-12-10 DIAGNOSIS — F4310 Post-traumatic stress disorder, unspecified: Secondary | ICD-10-CM

## 2018-12-10 DIAGNOSIS — Z8659 Personal history of other mental and behavioral disorders: Secondary | ICD-10-CM

## 2018-12-10 DIAGNOSIS — F1011 Alcohol abuse, in remission: Secondary | ICD-10-CM
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
## 2018-12-12 ENCOUNTER — Other Ambulatory Visit: Payer: Self-pay

## 2018-12-12 ENCOUNTER — Encounter: Payer: Self-pay | Admitting: Physical Therapy

## 2018-12-12 ENCOUNTER — Ambulatory Visit: Payer: BC Managed Care – PPO | Attending: Nurse Practitioner | Admitting: Physical Therapy

## 2018-12-12 DIAGNOSIS — G8929 Other chronic pain: Secondary | ICD-10-CM | POA: Diagnosis present

## 2018-12-12 DIAGNOSIS — R262 Difficulty in walking, not elsewhere classified: Secondary | ICD-10-CM | POA: Diagnosis present

## 2018-12-12 DIAGNOSIS — M545 Low back pain: Secondary | ICD-10-CM | POA: Diagnosis present
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
## 2018-12-14 ENCOUNTER — Telehealth: Payer: Self-pay | Admitting: Physical Therapy

## 2018-12-14 NOTE — Telephone Encounter (Signed)
Left message for patient returning her call. Advised I will be available until 1 today and will be back on Tuesday.  Kayda Allers C. Omari Mcmanaway PT, DPT 12/14/18 11:09 AM

## 2018-12-17 ENCOUNTER — Ambulatory Visit (INDEPENDENT_AMBULATORY_CARE_PROVIDER_SITE_OTHER): Payer: BC Managed Care – PPO | Admitting: Psychiatry

## 2018-12-17 ENCOUNTER — Other Ambulatory Visit: Payer: Self-pay

## 2018-12-17 DIAGNOSIS — F4310 Post-traumatic stress disorder, unspecified: Secondary | ICD-10-CM

## 2018-12-17 DIAGNOSIS — Z9889 Other specified postprocedural states: Secondary | ICD-10-CM

## 2018-12-17 DIAGNOSIS — Z92241 Personal history of systemic steroid therapy: Secondary | ICD-10-CM

## 2018-12-17 DIAGNOSIS — F411 Generalized anxiety disorder: Secondary | ICD-10-CM

## 2018-12-17 DIAGNOSIS — Z636 Dependent relative needing care at home: Secondary | ICD-10-CM

## 2018-12-17 DIAGNOSIS — F5104 Psychophysiologic insomnia: Secondary | ICD-10-CM | POA: Diagnosis not present

## 2018-12-17 DIAGNOSIS — F3130 Bipolar disorder, current episode depressed, mild or moderate severity, unspecified: Secondary | ICD-10-CM
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
## 2018-12-21 ENCOUNTER — Other Ambulatory Visit: Payer: Self-pay

## 2018-12-21 ENCOUNTER — Encounter: Payer: Self-pay | Admitting: Physical Therapy

## 2018-12-21 ENCOUNTER — Ambulatory Visit: Payer: BC Managed Care – PPO | Attending: Nurse Practitioner | Admitting: Physical Therapy

## 2018-12-21 DIAGNOSIS — G8929 Other chronic pain: Secondary | ICD-10-CM | POA: Diagnosis present

## 2018-12-21 DIAGNOSIS — R262 Difficulty in walking, not elsewhere classified: Secondary | ICD-10-CM | POA: Diagnosis present

## 2018-12-21 DIAGNOSIS — M545 Low back pain: Secondary | ICD-10-CM | POA: Diagnosis not present

## 2018-12-24 ENCOUNTER — Ambulatory Visit (INDEPENDENT_AMBULATORY_CARE_PROVIDER_SITE_OTHER): Payer: BC Managed Care – PPO | Admitting: Psychiatry

## 2018-12-24 ENCOUNTER — Other Ambulatory Visit: Payer: Self-pay

## 2018-12-24 DIAGNOSIS — Z62820 Parent-biological child conflict: Secondary | ICD-10-CM

## 2018-12-24 DIAGNOSIS — M5442 Lumbago with sciatica, left side: Secondary | ICD-10-CM

## 2018-12-24 DIAGNOSIS — F3130 Bipolar disorder, current episode depressed, mild or moderate severity, unspecified: Secondary | ICD-10-CM

## 2018-12-24 DIAGNOSIS — Z636 Dependent relative needing care at home: Secondary | ICD-10-CM

## 2018-12-24 DIAGNOSIS — F411 Generalized anxiety disorder: Secondary | ICD-10-CM | POA: Diagnosis not present
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
## 2018-12-26 ENCOUNTER — Other Ambulatory Visit: Payer: Self-pay

## 2018-12-26 ENCOUNTER — Encounter: Payer: Self-pay | Admitting: Physical Therapy

## 2018-12-26 ENCOUNTER — Ambulatory Visit: Payer: BC Managed Care – PPO | Admitting: Physical Therapy

## 2018-12-26 DIAGNOSIS — R262 Difficulty in walking, not elsewhere classified: Secondary | ICD-10-CM

## 2018-12-26 DIAGNOSIS — G8929 Other chronic pain: Secondary | ICD-10-CM

## 2018-12-26 DIAGNOSIS — M545 Low back pain: Secondary | ICD-10-CM | POA: Diagnosis not present
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
## 2018-12-28 ENCOUNTER — Telehealth: Payer: Self-pay | Admitting: Obstetrics & Gynecology

## 2018-12-28 NOTE — Telephone Encounter (Signed)
Patient has an infection on her right nipple from a piercing.

## 2018-12-28 NOTE — Telephone Encounter (Signed)
Call to patient. Has had on-going breast issue following removal of breast piercing over a year ago.  Denies active infection process.  Has "small white pimple" on nipple that looks like "blocked infection.' Has been there for over a year and requests Dr Sabra Heck evaluate if treatment needed.  Appointment scheduled for 01-07-19 at 1030. Declined earlier appointments offered due to schedule conflicts.   Routing to Dr Talbert Nan, covering for Dr Sabra Heck. Encounter closed.

## 2018-12-31 ENCOUNTER — Ambulatory Visit: Payer: Self-pay | Admitting: Psychiatry

## 2019-01-01 ENCOUNTER — Ambulatory Visit: Payer: BC Managed Care – PPO | Admitting: Physical Therapy

## 2019-01-03 ENCOUNTER — Other Ambulatory Visit: Payer: Self-pay

## 2019-01-03 ENCOUNTER — Ambulatory Visit: Payer: BC Managed Care – PPO | Admitting: Physical Therapy

## 2019-01-03 DIAGNOSIS — M545 Low back pain, unspecified: Secondary | ICD-10-CM

## 2019-01-03 DIAGNOSIS — G8929 Other chronic pain: Secondary | ICD-10-CM

## 2019-01-03 DIAGNOSIS — R262 Difficulty in walking, not elsewhere classified: Secondary | ICD-10-CM

## 2019-01-03 NOTE — Patient Instructions (Signed)
  Reissued articulating bridge exercise with more cueing  Ardine Eng pose with ball
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
## 2019-01-04 ENCOUNTER — Telehealth: Payer: Self-pay | Admitting: *Deleted

## 2019-01-04 DIAGNOSIS — G8929 Other chronic pain: Secondary | ICD-10-CM

## 2019-01-04 DIAGNOSIS — M5442 Lumbago with sciatica, left side: Secondary | ICD-10-CM

## 2019-01-04 NOTE — Telephone Encounter (Signed)
If she is having so much pain it would be beneficial for her to see a specialst, we can put a referral in for neurosurgery and if they feel like an MRI is needed they can arrange it.

## 2019-01-04 NOTE — Telephone Encounter (Signed)
Spoke with patient and advised results, pt would like refill to neurosurgery.

## 2019-01-04 NOTE — Telephone Encounter (Signed)
Pt calling asking why is she in so much pain? Pt is currently doing physical therapy and still in pain. Pt would like to have a MRI done to explain why her back is hurting. Please advise

## 2019-01-04 NOTE — Telephone Encounter (Signed)
Order placed.   If she does not hear back about referral in a week please call our office back and ask to speak with Lattie Haw (our referral coordinator) for an update.

## 2019-01-07 ENCOUNTER — Ambulatory Visit: Payer: BC Managed Care – PPO | Admitting: Obstetrics & Gynecology

## 2019-01-07 ENCOUNTER — Telehealth: Payer: Self-pay | Admitting: Obstetrics & Gynecology

## 2019-01-07 ENCOUNTER — Ambulatory Visit: Payer: Self-pay | Admitting: Psychiatry

## 2019-01-07 NOTE — Telephone Encounter (Signed)
Patient rescheduled her breast problem appointment today due to illness. She rescheduled to  01/09/19.

## 2019-01-08 ENCOUNTER — Ambulatory Visit: Payer: Self-pay | Admitting: Psychiatry

## 2019-01-08 ENCOUNTER — Ambulatory Visit: Payer: BC Managed Care – PPO | Admitting: Physical Therapy

## 2019-01-09 ENCOUNTER — Encounter: Payer: Self-pay | Admitting: Obstetrics & Gynecology

## 2019-01-09 ENCOUNTER — Ambulatory Visit: Payer: BC Managed Care – PPO | Admitting: Obstetrics & Gynecology

## 2019-01-10 ENCOUNTER — Encounter: Payer: Self-pay | Admitting: Psychiatry

## 2019-01-10 ENCOUNTER — Ambulatory Visit: Payer: BC Managed Care – PPO | Admitting: Physical Therapy
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
## 2019-01-14 ENCOUNTER — Telehealth: Payer: Self-pay | Admitting: Obstetrics & Gynecology

## 2019-01-14 NOTE — Telephone Encounter (Signed)
Patient would like to reschedule her breast problem visit that she missed.

## 2019-01-14 NOTE — Telephone Encounter (Signed)
Spoke with patient. Patient is leaving out of town 9/8 -9/15, requesting to reschedule OV with Dr. Sabra Heck for right breast check. Denies any new symptoms. Reports small, white "pimple" on nipple that has not resolved in the past year. OV scheduled for 9/4 at 11:30am.   Routing to provider for final review. Patient is agreeable to disposition. Will close encounter.

## 2019-01-15 ENCOUNTER — Other Ambulatory Visit: Payer: Self-pay

## 2019-01-15 ENCOUNTER — Encounter: Payer: Self-pay | Admitting: Physical Therapy

## 2019-01-15 ENCOUNTER — Ambulatory Visit: Payer: BC Managed Care – PPO | Attending: Nurse Practitioner | Admitting: Physical Therapy

## 2019-01-15 DIAGNOSIS — G8929 Other chronic pain: Secondary | ICD-10-CM | POA: Insufficient documentation

## 2019-01-15 DIAGNOSIS — R262 Difficulty in walking, not elsewhere classified: Secondary | ICD-10-CM | POA: Diagnosis present

## 2019-01-15 DIAGNOSIS — M545 Low back pain: Secondary | ICD-10-CM | POA: Diagnosis not present
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
## 2019-01-16 ENCOUNTER — Ambulatory Visit (INDEPENDENT_AMBULATORY_CARE_PROVIDER_SITE_OTHER): Payer: BC Managed Care – PPO | Admitting: Psychiatry

## 2019-01-16 DIAGNOSIS — F3130 Bipolar disorder, current episode depressed, mild or moderate severity, unspecified: Secondary | ICD-10-CM

## 2019-01-16 DIAGNOSIS — M5442 Lumbago with sciatica, left side: Secondary | ICD-10-CM | POA: Diagnosis not present

## 2019-01-16 DIAGNOSIS — F4310 Post-traumatic stress disorder, unspecified: Secondary | ICD-10-CM

## 2019-01-16 DIAGNOSIS — F1011 Alcohol abuse, in remission: Secondary | ICD-10-CM

## 2019-01-16 DIAGNOSIS — F1210 Cannabis abuse, uncomplicated: Secondary | ICD-10-CM

## 2019-01-16 DIAGNOSIS — F5104 Psychophysiologic insomnia: Secondary | ICD-10-CM

## 2019-01-16 DIAGNOSIS — F411 Generalized anxiety disorder: Secondary | ICD-10-CM | POA: Diagnosis not present
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
## 2019-01-17 ENCOUNTER — Other Ambulatory Visit: Payer: Self-pay

## 2019-01-17 ENCOUNTER — Ambulatory Visit: Payer: BC Managed Care – PPO | Admitting: Physical Therapy

## 2019-01-17 ENCOUNTER — Encounter: Payer: Self-pay | Admitting: Physical Therapy

## 2019-01-17 DIAGNOSIS — G8929 Other chronic pain: Secondary | ICD-10-CM

## 2019-01-17 DIAGNOSIS — R262 Difficulty in walking, not elsewhere classified: Secondary | ICD-10-CM

## 2019-01-17 DIAGNOSIS — M545 Low back pain: Secondary | ICD-10-CM | POA: Diagnosis not present
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
## 2019-01-18 ENCOUNTER — Ambulatory Visit: Payer: BC Managed Care – PPO | Admitting: Obstetrics & Gynecology

## 2019-01-18 ENCOUNTER — Encounter: Payer: Self-pay | Admitting: Obstetrics & Gynecology

## 2019-01-18 VITALS — BP 106/70 | HR 80 | Temp 98.2°F | Ht 65.0 in | Wt 124.0 lb

## 2019-01-18 DIAGNOSIS — N6459 Other signs and symptoms in breast: Secondary | ICD-10-CM
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
## 2019-01-22 ENCOUNTER — Ambulatory Visit: Payer: BC Managed Care – PPO | Admitting: Physical Therapy

## 2019-01-23 ENCOUNTER — Ambulatory Visit: Payer: BC Managed Care – PPO | Admitting: Psychiatry

## 2019-01-24 ENCOUNTER — Ambulatory Visit: Payer: BC Managed Care – PPO | Admitting: Physical Therapy

## 2019-01-29 ENCOUNTER — Ambulatory Visit: Payer: BC Managed Care – PPO | Admitting: Physical Therapy

## 2019-01-30 ENCOUNTER — Ambulatory Visit: Payer: BC Managed Care – PPO | Admitting: Psychiatry

## 2019-01-31 ENCOUNTER — Ambulatory Visit: Payer: BC Managed Care – PPO | Admitting: Physical Therapy

## 2019-02-01 ENCOUNTER — Telehealth: Payer: Self-pay | Admitting: Obstetrics & Gynecology

## 2019-02-01 NOTE — Telephone Encounter (Signed)
Patient is calling to follow up on nipple tattooing.

## 2019-02-01 NOTE — Telephone Encounter (Signed)
Spoke with patient. Patient states she discussed possibility of nipple tattooing at her 9/4 OV, patient is f/u for recommendations. Patient is asking if this is safe and who does it?   Advised I will forward to Dr. Sabra Heck to review and return call to advise. Patient agreeable.   Routing to Dr. Sabra Heck

## 2019-02-01 NOTE — Telephone Encounter (Signed)
Msg to [facility] Breast Navigator for recommendations.

## 2019-02-04 NOTE — Telephone Encounter (Signed)
Audelia Hives, MD, is the recommendation from the [facility] breast navigator.  Thanks.

## 2019-02-05 ENCOUNTER — Ambulatory Visit: Payer: BC Managed Care – PPO | Admitting: Physical Therapy

## 2019-02-05 NOTE — Telephone Encounter (Signed)
Call to patient, advised as seen below. Contact information provided. Dr. Audelia Hives [phone]. Patient will contact her office directly, is aware to return call if referral needed.   Routing to provider for final review. Patient is agreeable to disposition. Will close encounter.

## 2019-02-06 ENCOUNTER — Ambulatory Visit: Payer: BC Managed Care – PPO | Admitting: Psychiatry

## 2019-02-07 ENCOUNTER — Ambulatory Visit: Payer: BC Managed Care – PPO | Admitting: Physical Therapy

## 2019-02-12 ENCOUNTER — Ambulatory Visit: Payer: BC Managed Care – PPO | Admitting: Physical Therapy
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
## 2019-02-13 ENCOUNTER — Ambulatory Visit (INDEPENDENT_AMBULATORY_CARE_PROVIDER_SITE_OTHER): Payer: BC Managed Care – PPO | Admitting: Psychiatry

## 2019-02-13 ENCOUNTER — Other Ambulatory Visit: Payer: Self-pay

## 2019-02-13 DIAGNOSIS — F4310 Post-traumatic stress disorder, unspecified: Secondary | ICD-10-CM

## 2019-02-13 DIAGNOSIS — F411 Generalized anxiety disorder: Secondary | ICD-10-CM | POA: Diagnosis not present

## 2019-02-13 DIAGNOSIS — Z62820 Parent-biological child conflict: Secondary | ICD-10-CM

## 2019-02-13 DIAGNOSIS — F5104 Psychophysiologic insomnia: Secondary | ICD-10-CM | POA: Diagnosis not present

## 2019-02-13 DIAGNOSIS — F3130 Bipolar disorder, current episode depressed, mild or moderate severity, unspecified: Secondary | ICD-10-CM

## 2019-02-14 ENCOUNTER — Ambulatory Visit: Payer: BC Managed Care – PPO | Admitting: Physical Therapy

## 2019-02-19 ENCOUNTER — Ambulatory Visit: Payer: BC Managed Care – PPO | Admitting: Physical Therapy

## 2019-02-21 ENCOUNTER — Ambulatory Visit: Payer: BC Managed Care – PPO | Admitting: Physical Therapy

## 2019-02-22 ENCOUNTER — Encounter (HOSPITAL_COMMUNITY): Payer: Self-pay | Admitting: Emergency Medicine

## 2019-02-22 ENCOUNTER — Emergency Department (HOSPITAL_COMMUNITY)
Admission: EM | Admit: 2019-02-22 | Discharge: 2019-02-23 | Disposition: A | Discharge status: Home / Self Care | Payer: BC Managed Care – PPO | Attending: Emergency Medicine | Admitting: Emergency Medicine

## 2019-02-22 ENCOUNTER — Other Ambulatory Visit: Payer: Self-pay

## 2019-02-22 DIAGNOSIS — S91012A Laceration without foreign body, left ankle, initial encounter: Secondary | ICD-10-CM | POA: Insufficient documentation

## 2019-02-22 DIAGNOSIS — J45909 Unspecified asthma, uncomplicated: Secondary | ICD-10-CM | POA: Insufficient documentation

## 2019-02-22 DIAGNOSIS — F419 Anxiety disorder, unspecified: Secondary | ICD-10-CM | POA: Diagnosis not present

## 2019-02-22 DIAGNOSIS — Z79899 Other long term (current) drug therapy: Secondary | ICD-10-CM | POA: Diagnosis not present

## 2019-02-22 DIAGNOSIS — Y9389 Activity, other specified: Secondary | ICD-10-CM | POA: Insufficient documentation

## 2019-02-22 DIAGNOSIS — F3130 Bipolar disorder, current episode depressed, mild or moderate severity, unspecified: Secondary | ICD-10-CM | POA: Insufficient documentation

## 2019-02-22 DIAGNOSIS — Y92009 Unspecified place in unspecified non-institutional (private) residence as the place of occurrence of the external cause: Secondary | ICD-10-CM | POA: Insufficient documentation

## 2019-02-22 DIAGNOSIS — Z23 Encounter for immunization: Secondary | ICD-10-CM | POA: Insufficient documentation

## 2019-02-22 DIAGNOSIS — Y999 Unspecified external cause status: Secondary | ICD-10-CM | POA: Diagnosis not present

## 2019-02-22 DIAGNOSIS — X781XXA Intentional self-harm by knife, initial encounter: Secondary | ICD-10-CM | POA: Insufficient documentation

## 2019-02-22 DIAGNOSIS — F1210 Cannabis abuse, uncomplicated: Secondary | ICD-10-CM | POA: Insufficient documentation

## 2019-02-22 NOTE — ED Triage Notes (Signed)
Patient is a cutter. She her left lower inner leg. Patient states she does not want to hurt herself. She states she like the endorphins that come from cutting.

## 2019-02-23 MED ORDER — TETANUS-DIPHTH-ACELL PERTUSSIS 5-2.5-18.5 LF-MCG/0.5 IM SUSP
0.5000 mL | Freq: Once | INTRAMUSCULAR | Status: AC
  Administered 2019-02-23: 0.5 mL via INTRAMUSCULAR
  Filled 2019-02-23: qty 0.5

## 2019-02-23 MED ORDER — LIDOCAINE-EPINEPHRINE (PF) 2 %-1:200000 IJ SOLN
10.0000 mL | Freq: Once | INTRAMUSCULAR | Status: AC
  Administered 2019-02-23: 10 mL
  Filled 2019-02-23: qty 10

## 2019-02-23 NOTE — Discharge Instructions (Addendum)
1. Medications: Tylenol or ibuprofen for pain, usual home medications ° °2. Treatment: ice for swelling, keep wound clean with warm soap and water and keep bandage dry, do not submerge in water for 24 hours ° °3. Follow Up: Please return in 10 days to have your stitches/staples removed or sooner if you have concerns. Return to the emergency department for increased redness, drainage of pus from the wound ° ° °WOUND CARE ° Keep area clean and dry for 24 hours. Do not remove bandage, if applied. ° After 24 hours, remove bandage and wash wound gently with mild soap and warm water. Reapply a new bandage after cleaning wound, if directed.  ° Continue daily cleansing with soap and water until stitches/staples are removed. ° Do not apply any ointments or creams to the wound while stitches/staples are in place, as this may cause delayed healing. °Return if you experience any of the following signs of infection: Swelling, redness, pus drainage, streaking, fever >101.0 F ° Return if you experience excessive bleeding that does not stop after 15-20 minutes of constant, firm pressure. ° °
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
## 2019-02-23 NOTE — BH Assessment (Signed)
[place] Assessment Progress Note  Per Lindon Romp, NP pt does not meet criteria for inpt tx. NP contacted EDP Muthersbaugh, Jarrett Soho, PA-C to advise of disposition.
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
## 2019-02-23 NOTE — ED Notes (Signed)
TTS machine at bedside. 

## 2019-02-28 ENCOUNTER — Encounter: Payer: Self-pay | Admitting: Plastic Surgery

## 2019-02-28 ENCOUNTER — Other Ambulatory Visit: Payer: Self-pay

## 2019-02-28 ENCOUNTER — Ambulatory Visit (INDEPENDENT_AMBULATORY_CARE_PROVIDER_SITE_OTHER): Payer: Self-pay | Admitting: Plastic Surgery

## 2019-02-28 VITALS — BP 113/78 | HR 89 | Temp 96.4°F | Ht 65.0 in | Wt 125.6 lb

## 2019-02-28 DIAGNOSIS — Z428 Encounter for other plastic and reconstructive surgery following medical procedure or healed injury: Secondary | ICD-10-CM
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
## 2019-03-09 ENCOUNTER — Other Ambulatory Visit: Payer: Self-pay | Admitting: Obstetrics and Gynecology

## 2019-03-13 ENCOUNTER — Other Ambulatory Visit: Payer: Self-pay | Admitting: Obstetrics & Gynecology

## 2019-03-13 MED ORDER — ESTRADIOL-NORETHINDRONE ACET 0.5-0.1 MG PO TABS: 1.0000 | ORAL_TABLET | Freq: Every day | ORAL | 0 refills | Status: DC

## 2019-03-13 NOTE — Telephone Encounter (Signed)
Patient is calling regarding medication refill for estradiol-norethindrone acet 0.5-0.1 MG tablet

## 2019-03-13 NOTE — Telephone Encounter (Signed)
Returned call to pt.  Pt wanting refill for estradiol-norethindrone acet 0.5-0.1 MG tablet. States she has already been off for 5 days and feeling " like her fur is rubbing the wrong way, really needs meds because of her psych history and psych meds and doesn't want to get out of whack" She also states leaving for the beach 11/29 morning and wont be back in town until 11/6. Pt agrees to see Dr Quincy Simmonds and scheduled AEX appt on 11/9 at 1100 d/t Dr Sanjuan Dame schedule.   Pt agreeable. Would like med refill until AEX on 03/25/19.  Will route to Dr Sabra Heck, Dr Quincy Simmonds for review and recommendations.   Will pend refill order

## 2019-03-21 ENCOUNTER — Other Ambulatory Visit: Payer: Self-pay
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
## 2019-03-23 ENCOUNTER — Other Ambulatory Visit: Payer: Self-pay

## 2019-03-23 ENCOUNTER — Ambulatory Visit
Admission: EM | Admit: 2019-03-23 | Discharge: 2019-03-23 | Disposition: A | Discharge status: Home / Self Care | Payer: BC Managed Care – PPO | Attending: Emergency Medicine | Admitting: Emergency Medicine

## 2019-03-23 DIAGNOSIS — R300 Dysuria: Secondary | ICD-10-CM | POA: Insufficient documentation

## 2019-03-23 DIAGNOSIS — N3011 Interstitial cystitis (chronic) with hematuria: Secondary | ICD-10-CM | POA: Diagnosis present

## 2019-03-23 LAB — POCT URINALYSIS DIP (MANUAL ENTRY)
Bilirubin, UA: NEGATIVE
Glucose, UA: NEGATIVE mg/dL
Ketones, POC UA: NEGATIVE mg/dL
Leukocytes, UA: NEGATIVE
Nitrite, UA: NEGATIVE
Protein Ur, POC: NEGATIVE mg/dL
Spec Grav, UA: 1.015 (ref 1.010–1.025)
Urobilinogen, UA: 0.2 E.U./dL
pH, UA: 8.5 — AB (ref 5.0–8.0)
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
## 2019-03-23 NOTE — Discharge Instructions (Signed)
Follow-up with urology on Monday. Return for persistent, worsening symptoms such as fever, abdominal or back pain. Your culture is pending: We will call you if anything grows, and there is indication to treat with antibiotic.

## 2019-03-23 NOTE — ED Triage Notes (Signed)
Pt presents to UC w/ c/o burning during and after urinating. Frequency and urgency of urination. Pt has had these symptoms x4 days. Pt does self caths at home.

## 2019-03-25 ENCOUNTER — Other Ambulatory Visit: Payer: Self-pay

## 2019-03-25 ENCOUNTER — Ambulatory Visit: Payer: BC Managed Care – PPO | Admitting: Obstetrics and Gynecology

## 2019-03-25 ENCOUNTER — Encounter: Payer: Self-pay | Admitting: Obstetrics and Gynecology

## 2019-03-25 VITALS — BP 100/64 | HR 70 | Temp 96.8°F | Resp 12 | Ht 64.25 in | Wt 131.8 lb

## 2019-03-25 DIAGNOSIS — Z01419 Encounter for gynecological examination (general) (routine) without abnormal findings: Secondary | ICD-10-CM

## 2019-03-25 MED ORDER — PROGESTERONE MICRONIZED 100 MG PO CAPS: 100.0000 mg | ORAL_CAPSULE | Freq: Every day | ORAL | 3 refills | Status: DC

## 2019-03-25 MED ORDER — ESTRADIOL 0.075 MG/24HR TD PTTW: 1.0000 | MEDICATED_PATCH | TRANSDERMAL | 3 refills | Status: DC

## 2019-03-25 NOTE — Patient Instructions (Signed)

## 2019-03-27 LAB — URINE CULTURE: Culture: NO GROWTH

## 2019-04-05 ENCOUNTER — Telehealth: Payer: Self-pay | Admitting: Obstetrics and Gynecology

## 2019-04-05 DIAGNOSIS — Z01419 Encounter for gynecological examination (general) (routine) without abnormal findings: Secondary | ICD-10-CM

## 2019-04-05 NOTE — Telephone Encounter (Signed)
Pt requesting would like to try Gabapentin cream that was discussed at [facility] on 03/25/19. Pt declined at time of OV.   Will route to Dr Quincy Simmonds for recommendations.

## 2019-04-05 NOTE — Telephone Encounter (Signed)
Patient has decided she would like to try the gabapentin compound that she discussed with Dr.Silva.

## 2019-04-08 MED ORDER — NONFORMULARY OR COMPOUNDED ITEM: 1 refills | Status: DC

## 2019-04-08 NOTE — Telephone Encounter (Signed)
Rx for Gabapentin 6% cream ordered, signed by Dr Quincy Simmonds  and faxed to [facility] or [facility]. Will call pt to make aware of Rx.

## 2019-04-08 NOTE — Telephone Encounter (Signed)
Ok for Gabapentin 6% cream in neutral base.  Apply to vulva tid prn. Dispense:  30 grams, RF 1.   Will need to go to compounding pharmacy.  She can let me know how it is working for her.

## 2019-04-08 NOTE — Telephone Encounter (Signed)
Pt called back to know which pharmacy to send Gabapentin cream. Rx sent to [facility].

## 2019-04-10 ENCOUNTER — Telehealth (INDEPENDENT_AMBULATORY_CARE_PROVIDER_SITE_OTHER): Payer: Self-pay | Admitting: Psychiatry
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
## 2019-04-24 ENCOUNTER — Other Ambulatory Visit: Payer: Self-pay

## 2019-04-24 ENCOUNTER — Ambulatory Visit (INDEPENDENT_AMBULATORY_CARE_PROVIDER_SITE_OTHER): Payer: Self-pay | Admitting: Psychiatry

## 2019-04-24 DIAGNOSIS — F3175 Bipolar disorder, in partial remission, most recent episode depressed: Secondary | ICD-10-CM

## 2019-04-24 DIAGNOSIS — K219 Gastro-esophageal reflux disease without esophagitis: Secondary | ICD-10-CM

## 2019-04-24 DIAGNOSIS — Z87898 Personal history of other specified conditions: Secondary | ICD-10-CM

## 2019-04-24 DIAGNOSIS — Z91410 Personal history of adult physical and sexual abuse: Secondary | ICD-10-CM

## 2019-04-24 DIAGNOSIS — F4310 Post-traumatic stress disorder, unspecified: Secondary | ICD-10-CM

## 2019-04-24 DIAGNOSIS — F5104 Psychophysiologic insomnia: Secondary | ICD-10-CM

## 2019-04-24 DIAGNOSIS — Z62820 Parent-biological child conflict: Secondary | ICD-10-CM

## 2019-04-24 DIAGNOSIS — N3010 Interstitial cystitis (chronic) without hematuria: Secondary | ICD-10-CM

## 2019-04-24 DIAGNOSIS — Z62810 Personal history of physical and sexual abuse in childhood: Secondary | ICD-10-CM

## 2019-04-24 DIAGNOSIS — Z636 Dependent relative needing care at home: Secondary | ICD-10-CM

## 2019-04-24 DIAGNOSIS — F411 Generalized anxiety disorder: Secondary | ICD-10-CM

## 2019-04-24 DIAGNOSIS — F1011 Alcohol abuse, in remission: Secondary | ICD-10-CM
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
## 2019-05-07 ENCOUNTER — Ambulatory Visit: Payer: BC Managed Care – PPO | Attending: Internal Medicine

## 2019-05-07 DIAGNOSIS — Z20822 Contact with and (suspected) exposure to covid-19: Secondary | ICD-10-CM

## 2019-05-09 LAB — NOVEL CORONAVIRUS, NAA: SARS-CoV-2, NAA: NOT DETECTED

## 2019-05-24 ENCOUNTER — Telehealth: Payer: Self-pay | Admitting: Psychiatry

## 2019-05-24 ENCOUNTER — Ambulatory Visit: Payer: Self-pay | Admitting: Psychiatry

## 2019-05-24 NOTE — Telephone Encounter (Signed)
Admin note for non-service contact  Patient ID: Erika Mccoy  MRN: [id number] DATE: 05/24/2019  [place] short notice via email (responding to an informational message about procedure in case of weather event today).  Requested she also call the office directly to ensure proper handling, but CA/RS at no charge under usual weather policy.  Robley Fries, PhD [name], PhD LP Clinical Psychologist, [facility] [address] [place], [place] [address] [phone]

## 2019-06-05 ENCOUNTER — Ambulatory Visit: Payer: Self-pay | Admitting: Psychiatry

## 2019-06-10 ENCOUNTER — Ambulatory Visit: Payer: Self-pay | Admitting: Nurse Practitioner

## 2019-06-19 ENCOUNTER — Ambulatory Visit: Payer: Self-pay | Admitting: Psychiatry

## 2019-06-27 ENCOUNTER — Other Ambulatory Visit: Payer: Self-pay | Admitting: Obstetrics and Gynecology

## 2019-06-27 DIAGNOSIS — Z1231 Encounter for screening mammogram for malignant neoplasm of breast: Secondary | ICD-10-CM

## 2019-07-03 ENCOUNTER — Ambulatory Visit: Payer: Self-pay | Admitting: Psychiatry

## 2019-07-08 DIAGNOSIS — M6289 Other specified disorders of muscle: Secondary | ICD-10-CM | POA: Insufficient documentation

## 2019-07-08 DIAGNOSIS — Z789 Other specified health status: Secondary | ICD-10-CM | POA: Insufficient documentation

## 2019-07-09 DIAGNOSIS — N3941 Urge incontinence: Secondary | ICD-10-CM | POA: Insufficient documentation

## 2019-08-05 ENCOUNTER — Encounter: Payer: Self-pay | Admitting: Certified Nurse Midwife

## 2019-08-05 ENCOUNTER — Ambulatory Visit: Payer: BC Managed Care – PPO

## 2019-08-05 ENCOUNTER — Other Ambulatory Visit: Payer: Self-pay

## 2019-08-06 ENCOUNTER — Ambulatory Visit: Payer: BC Managed Care – PPO

## 2019-08-21 ENCOUNTER — Ambulatory Visit
Admission: RE | Admit: 2019-08-21 | Discharge: 2019-08-21 | Disposition: A | Discharge status: Home / Self Care | Payer: BC Managed Care – PPO | Source: Ambulatory Visit | Attending: Obstetrics and Gynecology | Admitting: Obstetrics and Gynecology

## 2019-08-21 ENCOUNTER — Other Ambulatory Visit: Payer: Self-pay

## 2019-08-21 DIAGNOSIS — Z1231 Encounter for screening mammogram for malignant neoplasm of breast: Secondary | ICD-10-CM

## 2019-08-22 ENCOUNTER — Other Ambulatory Visit: Payer: Self-pay | Admitting: Obstetrics and Gynecology

## 2019-08-22 DIAGNOSIS — R928 Other abnormal and inconclusive findings on diagnostic imaging of breast: Secondary | ICD-10-CM

## 2019-09-05 ENCOUNTER — Ambulatory Visit: Payer: BC Managed Care – PPO

## 2019-09-05 ENCOUNTER — Other Ambulatory Visit: Payer: Self-pay | Admitting: Obstetrics and Gynecology

## 2019-09-05 ENCOUNTER — Other Ambulatory Visit: Payer: Self-pay

## 2019-09-05 ENCOUNTER — Ambulatory Visit
Admission: RE | Admit: 2019-09-05 | Discharge: 2019-09-05 | Disposition: A | Discharge status: Home / Self Care | Payer: BC Managed Care – PPO | Source: Ambulatory Visit | Attending: Obstetrics and Gynecology | Admitting: Obstetrics and Gynecology

## 2019-09-05 DIAGNOSIS — R928 Other abnormal and inconclusive findings on diagnostic imaging of breast: Secondary | ICD-10-CM

## 2019-09-16 ENCOUNTER — Ambulatory Visit (INDEPENDENT_AMBULATORY_CARE_PROVIDER_SITE_OTHER): Payer: Self-pay

## 2019-09-16 ENCOUNTER — Other Ambulatory Visit: Payer: Self-pay

## 2019-09-16 VITALS — BP 120/68 | HR 69 | Temp 97.3°F | Ht 66.0 in | Wt 138.0 lb

## 2019-09-16 DIAGNOSIS — Z719 Counseling, unspecified: Secondary | ICD-10-CM

## 2019-10-10 ENCOUNTER — Telehealth: Payer: Self-pay

## 2019-10-10 NOTE — Telephone Encounter (Signed)
Patient is calling in regards to consult. Patient states she went from low to high breast density and has  Gained weight and feels off. Patient states she is taking HRT and and would like a consult for this before continuing to take it as well as having blood work done.

## 2019-10-10 NOTE — Telephone Encounter (Signed)
Spoke with patient. Patient is requesting OV with Dr. Hyacinth Meeker to discuss HRT and changes in breast density on recent MMG. No current breast concerns. Currently on estradiol 0.075 mg patch and Prometrium 100 mg PO nightly. Last AEX 03/25/19 with Dr. Edward Jolly.   OV scheduled for 6/7 at 11am with Dr. Hyacinth Meeker.   Routing to provider for final review. Patient is agreeable to disposition. Will close encounter.

## 2019-10-15 ENCOUNTER — Other Ambulatory Visit: Payer: Self-pay

## 2019-10-15 ENCOUNTER — Ambulatory Visit (INDEPENDENT_AMBULATORY_CARE_PROVIDER_SITE_OTHER): Payer: BC Managed Care – PPO | Admitting: Psychiatry

## 2019-10-15 DIAGNOSIS — Z780 Asymptomatic menopausal state: Secondary | ICD-10-CM

## 2019-10-15 DIAGNOSIS — F411 Generalized anxiety disorder: Secondary | ICD-10-CM

## 2019-10-15 DIAGNOSIS — Z62810 Personal history of physical and sexual abuse in childhood: Secondary | ICD-10-CM

## 2019-10-15 DIAGNOSIS — F3175 Bipolar disorder, in partial remission, most recent episode depressed: Secondary | ICD-10-CM

## 2019-10-15 DIAGNOSIS — F4522 Body dysmorphic disorder: Secondary | ICD-10-CM

## 2019-10-15 DIAGNOSIS — F1011 Alcohol abuse, in remission: Secondary | ICD-10-CM

## 2019-10-15 DIAGNOSIS — N3010 Interstitial cystitis (chronic) without hematuria: Secondary | ICD-10-CM

## 2019-10-15 DIAGNOSIS — Z8659 Personal history of other mental and behavioral disorders: Secondary | ICD-10-CM

## 2019-10-15 DIAGNOSIS — Z87898 Personal history of other specified conditions: Secondary | ICD-10-CM

## 2019-10-15 DIAGNOSIS — Z91410 Personal history of adult physical and sexual abuse: Secondary | ICD-10-CM

## 2019-10-15 DIAGNOSIS — Z62820 Parent-biological child conflict: Secondary | ICD-10-CM | POA: Diagnosis not present
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
## 2019-10-21 ENCOUNTER — Encounter: Payer: Self-pay | Admitting: Obstetrics & Gynecology

## 2019-10-21 ENCOUNTER — Ambulatory Visit: Payer: BC Managed Care – PPO | Admitting: Obstetrics & Gynecology

## 2019-10-21 DIAGNOSIS — Z01419 Encounter for gynecological examination (general) (routine) without abnormal findings: Secondary | ICD-10-CM | POA: Diagnosis not present

## 2019-10-21 MED ORDER — ESTRADIOL 0.05 MG/24HR TD PTTW: 1.0000 | MEDICATED_PATCH | TRANSDERMAL | 2 refills | Status: DC

## 2019-10-21 MED ORDER — NONFORMULARY OR COMPOUNDED ITEM: 1 refills | Status: DC

## 2019-11-01 ENCOUNTER — Telehealth: Payer: Self-pay

## 2019-11-01 NOTE — Telephone Encounter (Signed)
Patient is calling in regards to wanting to discuss pelvic physical therapy and the referral from Dr, Hyacinth Meeker.

## 2019-11-04 NOTE — Telephone Encounter (Signed)
Patient left message returning call to Jill.  °

## 2019-11-04 NOTE — Telephone Encounter (Signed)
Left message to call Noreene Larsson, RN at [facility] [phone].   AEX 10/21/19

## 2019-11-04 NOTE — Telephone Encounter (Signed)
Spoke with patient. Patient is requesting a referral for pelvic PT. States she discussed this with Dr. Hyacinth Meeker at 10/21/19 AEX, has decided to proceed. States she is having increased difficulty with bowel movements and voiding. Denies any new symptoms. Advised patient I will forward to Dr. Hyacinth Meeker to review request and our office will f/u. Patient agreeable.   Dr. Hyacinth Meeker -please advise on pelvic floor PT referral.

## 2019-11-05 ENCOUNTER — Encounter: Payer: Self-pay | Admitting: Nurse Practitioner

## 2019-11-06 ENCOUNTER — Other Ambulatory Visit: Payer: Self-pay | Admitting: Obstetrics & Gynecology

## 2019-11-06 DIAGNOSIS — N398 Other specified disorders of urinary system: Secondary | ICD-10-CM

## 2019-11-06 NOTE — Progress Notes (Signed)
Referral to pelvic PT placed. 

## 2019-11-06 NOTE — Telephone Encounter (Signed)
Pelvic PT referral to [facility] at [facility] placed.  Ok to let pt know and close encounter.

## 2019-11-06 NOTE — Telephone Encounter (Signed)
Call to patient, left detailed message, ok per dpr. Advised as seen below per Dr. Hyacinth Meeker. Advised our office referral coordinator will f/u with appt details once scheduled. Return call to office if any additional questions.   Routing to [employer].   Encounter closed.

## 2019-11-13 ENCOUNTER — Ambulatory Visit (INDEPENDENT_AMBULATORY_CARE_PROVIDER_SITE_OTHER): Payer: BC Managed Care – PPO | Admitting: Psychiatry

## 2019-11-13 ENCOUNTER — Other Ambulatory Visit: Payer: Self-pay

## 2019-11-13 DIAGNOSIS — F3175 Bipolar disorder, in partial remission, most recent episode depressed: Secondary | ICD-10-CM

## 2019-11-13 DIAGNOSIS — Z91410 Personal history of adult physical and sexual abuse: Secondary | ICD-10-CM

## 2019-11-13 DIAGNOSIS — Z92241 Personal history of systemic steroid therapy: Secondary | ICD-10-CM

## 2019-11-13 DIAGNOSIS — F4310 Post-traumatic stress disorder, unspecified: Secondary | ICD-10-CM | POA: Diagnosis not present

## 2019-11-13 DIAGNOSIS — Z62820 Parent-biological child conflict: Secondary | ICD-10-CM

## 2019-11-13 DIAGNOSIS — F411 Generalized anxiety disorder: Secondary | ICD-10-CM

## 2019-11-13 DIAGNOSIS — Z8659 Personal history of other mental and behavioral disorders: Secondary | ICD-10-CM

## 2019-11-13 DIAGNOSIS — F5104 Psychophysiologic insomnia: Secondary | ICD-10-CM

## 2019-11-13 DIAGNOSIS — Z62810 Personal history of physical and sexual abuse in childhood: Secondary | ICD-10-CM

## 2019-11-13 DIAGNOSIS — Z87898 Personal history of other specified conditions: Secondary | ICD-10-CM

## 2019-11-13 DIAGNOSIS — Z780 Asymptomatic menopausal state: Secondary | ICD-10-CM

## 2019-11-13 DIAGNOSIS — F1210 Cannabis abuse, uncomplicated: Secondary | ICD-10-CM

## 2019-11-13 DIAGNOSIS — N3010 Interstitial cystitis (chronic) without hematuria: Secondary | ICD-10-CM

## 2019-11-13 DIAGNOSIS — F1011 Alcohol abuse, in remission: Secondary | ICD-10-CM
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
## 2019-11-14 ENCOUNTER — Telehealth: Payer: Self-pay

## 2019-11-14 NOTE — Telephone Encounter (Signed)
Opened in error

## 2019-12-09 ENCOUNTER — Ambulatory Visit: Payer: BC Managed Care – PPO

## 2019-12-16 ENCOUNTER — Telehealth: Payer: Self-pay

## 2019-12-16 NOTE — Telephone Encounter (Signed)
Patient left message regarding bleeding after intercourse. Patient stated she " has not bleed in over 10 years."

## 2019-12-16 NOTE — Telephone Encounter (Signed)
PMP- on HRT AEX 10/21/19  Spoke with pt. Pt states having vaginal bleeding with small clots after intercourse on Saturday. Pt then states was pink on Sunday and nothing today. Pt states did not wear pad or panty liner at all, just noticed more with wiping. Pt had uterine ablation and had no vaginal bleeding in over 10 years.  Pt states has known hx of Lichen Sclerosis and is having a current flare up and will start using steroid cream today. Pt states started having LS flare about 2 weeks ago when on vacation.  Pt states only having itching externally. Pt denies any vaginal bleeding now or clots.   Pt advised to have OV for further evaluation. Pt agreeable.  Pt scheduled for OV with Dr Hyacinth Meeker on 8/3 at 4 pm. Pt verbalized understanding to date and time of appt.   Encounter closed.

## 2019-12-18 ENCOUNTER — Other Ambulatory Visit: Payer: Self-pay

## 2019-12-18 ENCOUNTER — Encounter: Payer: Self-pay | Admitting: Obstetrics & Gynecology

## 2019-12-18 ENCOUNTER — Ambulatory Visit (INDEPENDENT_AMBULATORY_CARE_PROVIDER_SITE_OTHER): Payer: BC Managed Care – PPO | Admitting: Obstetrics & Gynecology

## 2019-12-18 VITALS — BP 132/78 | HR 70 | Ht 65.0 in | Wt 148.0 lb

## 2019-12-18 DIAGNOSIS — N898 Other specified noninflammatory disorders of vagina: Secondary | ICD-10-CM

## 2019-12-18 DIAGNOSIS — N939 Abnormal uterine and vaginal bleeding, unspecified: Secondary | ICD-10-CM
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
## 2019-12-18 NOTE — Patient Instructions (Signed)
[facility] [address] [place], [place] [address]  [place]: [phone] Fax: [phone] Hours (M-F): 8am - 5pm 

## 2019-12-20 LAB — VAGINITIS/VAGINOSIS, DNA PROBE
Candida Species: NEGATIVE
Gardnerella vaginalis: NEGATIVE
Trichomonas vaginosis: NEGATIVE

## 2019-12-23 ENCOUNTER — Other Ambulatory Visit: Payer: Self-pay | Admitting: *Deleted

## 2019-12-23 DIAGNOSIS — N930 Postcoital and contact bleeding: Secondary | ICD-10-CM

## 2019-12-24 ENCOUNTER — Telehealth: Payer: Self-pay | Admitting: Obstetrics & Gynecology

## 2019-12-24 NOTE — Telephone Encounter (Signed)
Call to patient. Per DPR, OK to leave message on voicemail.  Left voicemail requesting a return call to [facility] to review benefits for scheduled Pelvic ultrasound with M. Suzanne Miller, MD 

## 2019-12-24 NOTE — Telephone Encounter (Signed)
Patient left message returning call.

## 2019-12-24 NOTE — Telephone Encounter (Signed)
Spoke with patient regarding benefits for scheduled Pelvic ultrasound. Patient acknowledges understanding of information presented. Encounter closed. 

## 2019-12-30 ENCOUNTER — Ambulatory Visit: Payer: BC Managed Care – PPO | Attending: Obstetrics & Gynecology | Admitting: Physical Therapy

## 2020-01-02 ENCOUNTER — Ambulatory Visit (INDEPENDENT_AMBULATORY_CARE_PROVIDER_SITE_OTHER): Payer: BC Managed Care – PPO | Admitting: Obstetrics & Gynecology

## 2020-01-02 ENCOUNTER — Other Ambulatory Visit: Payer: Self-pay

## 2020-01-02 ENCOUNTER — Encounter: Payer: Self-pay | Admitting: Obstetrics & Gynecology

## 2020-01-02 ENCOUNTER — Ambulatory Visit (INDEPENDENT_AMBULATORY_CARE_PROVIDER_SITE_OTHER): Payer: BC Managed Care – PPO

## 2020-01-02 VITALS — BP 106/68 | HR 70 | Resp 16 | Wt 148.0 lb

## 2020-01-02 DIAGNOSIS — N926 Irregular menstruation, unspecified: Secondary | ICD-10-CM

## 2020-01-02 DIAGNOSIS — N9489 Other specified conditions associated with female genital organs and menstrual cycle: Secondary | ICD-10-CM | POA: Diagnosis not present

## 2020-01-02 DIAGNOSIS — N930 Postcoital and contact bleeding: Secondary | ICD-10-CM
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
## 2020-01-03 ENCOUNTER — Telehealth: Payer: Self-pay | Admitting: Obstetrics & Gynecology

## 2020-01-03 DIAGNOSIS — N926 Irregular menstruation, unspecified: Secondary | ICD-10-CM

## 2020-01-03 LAB — FOLLICLE STIMULATING HORMONE: FSH: 3.3 m[IU]/mL

## 2020-01-03 NOTE — Telephone Encounter (Signed)
01/03/20 FSH 3.3  Call returned to patient. Advised as seen above, advised not in menopause range.   Patient asking if she is perimenopausal? Where are symptoms coming from at her age? Advised Dr. Hyacinth Meeker still needs to review labs, once reviewed our office will return call to review recommendations. Patient agreeable.   Routing to Dr. Hyacinth Meeker

## 2020-01-03 NOTE — Telephone Encounter (Signed)
Patient calling to go over results in Mychart.

## 2020-01-05 ENCOUNTER — Encounter: Payer: Self-pay | Admitting: Obstetrics & Gynecology

## 2020-01-06 ENCOUNTER — Telehealth: Payer: Self-pay | Admitting: Obstetrics & Gynecology

## 2020-01-06 NOTE — Telephone Encounter (Signed)
Routing to Dr Hyacinth Meeker for results and recommendations for [facility].

## 2020-01-06 NOTE — Telephone Encounter (Signed)
Stogner-Comer, Conception [facility] Clinical Pool What does this mean. Exactly? Thank you Dr. Hyacinth Meeker.

## 2020-01-07 NOTE — Telephone Encounter (Signed)
Left message to call Lucile Didonato, RN at [facility] [phone].   

## 2020-01-07 NOTE — Telephone Encounter (Signed)
Patient is returning call.  °

## 2020-01-07 NOTE — Telephone Encounter (Signed)
Message about this result has been routed to triage.  Ok to close this encounter.  Thanks.

## 2020-01-07 NOTE — Telephone Encounter (Signed)
Spoke with pt. Pt given results and recommendations per Dr Hyacinth Meeker. Pt agreeable and verbalized understanding. Pt agreeable to have AMH and EMB same day.  Pt scheduled for 01/28/20 at 4 pm with Dr Hyacinth Meeker. Pt verbalized understanding. Pt aware to take Tylenol before appt to help with cramping. Pt agreeable.   Routing to Dr Hyacinth Meeker for update  Cc: [facility] for precert Orders placed  Encounter closed.

## 2020-01-07 NOTE — Telephone Encounter (Signed)
Please let pt know her FSH level does not show she is menopausal.  I would like to do a second test called an AMH to see if her HRT is causing her FSH to appear low.  Also, given her bleeding and the possibly polyp, before I do anything in the OR, I would like to try and do an endometrial biopsy in the office.  Thanks.

## 2020-01-07 NOTE — Telephone Encounter (Signed)
This encounter closed.  See encounter dated 01/03/20.

## 2020-01-08 ENCOUNTER — Telehealth: Payer: Self-pay | Admitting: Obstetrics & Gynecology

## 2020-01-08 NOTE — Telephone Encounter (Signed)
Call to patient. Per DPR, OK to leave message on voicemail.   Left voicemail requesting a return call to [facility] to review benefits for scheduled Endometrial biopsy with M. Leda Quail, MD

## 2020-01-09 ENCOUNTER — Ambulatory Visit: Payer: Self-pay | Admitting: Family Medicine

## 2020-01-10 NOTE — Telephone Encounter (Signed)
Call placed to convey benefits for scheduled appointment for 01/28/20.

## 2020-01-14 NOTE — Telephone Encounter (Signed)
Patient returned my call and I conveyed the benefits. Patient understands/agreeable with the benefits. Patient is aware of the cancellation policy. Appointment scheduled 01/28/20.

## 2020-01-28 ENCOUNTER — Other Ambulatory Visit (HOSPITAL_COMMUNITY)
Admission: RE | Admit: 2020-01-28 | Discharge: 2020-01-28 | Disposition: A | Discharge status: Home / Self Care | Payer: BC Managed Care – PPO | Source: Ambulatory Visit | Attending: Obstetrics & Gynecology | Admitting: Obstetrics & Gynecology

## 2020-01-28 ENCOUNTER — Encounter: Payer: Self-pay | Admitting: Obstetrics & Gynecology

## 2020-01-28 ENCOUNTER — Other Ambulatory Visit: Payer: Self-pay

## 2020-01-28 ENCOUNTER — Ambulatory Visit: Payer: BC Managed Care – PPO | Admitting: Obstetrics & Gynecology

## 2020-01-28 VITALS — BP 118/72 | HR 70 | Resp 16 | Wt 144.0 lb

## 2020-01-28 DIAGNOSIS — N926 Irregular menstruation, unspecified: Secondary | ICD-10-CM | POA: Insufficient documentation

## 2020-01-28 DIAGNOSIS — R102 Pelvic and perineal pain unspecified side: Secondary | ICD-10-CM

## 2020-01-28 DIAGNOSIS — N9489 Other specified conditions associated with female genital organs and menstrual cycle: Secondary | ICD-10-CM | POA: Diagnosis not present

## 2020-01-28 MED ORDER — NONFORMULARY OR COMPOUNDED ITEM: 5 refills | Status: DC
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
## 2020-01-30 LAB — SURGICAL PATHOLOGY

## 2020-01-31 ENCOUNTER — Telehealth: Payer: Self-pay | Admitting: *Deleted

## 2020-01-31 MED ORDER — NONFORMULARY OR COMPOUNDED ITEM: 5 refills | Status: DC

## 2020-01-31 NOTE — Telephone Encounter (Signed)
Patient is returning a call to Jill. °

## 2020-01-31 NOTE — Telephone Encounter (Signed)
-----   Message from Jerene Bears, MD sent at 01/31/2020  6:30 AM EDT ----- Please let pt know her endometrial biopsy showed inactive endometrial glands.  She has a possible small polyp on ultrasound.  Still awaiting the additional AMH test but risk now I would recommend repeating the ultrasound in a year but not doing anything surgically.

## 2020-01-31 NOTE — Telephone Encounter (Signed)
Spoke with patient, advised per Dr. Hyacinth Meeker.  Patient verbalizes understanding and is agreeable to plan.  She is aware she will be notified of final results once completed and reviewed by Dr. Hyacinth Meeker.  Encounter closed.

## 2020-01-31 NOTE — Telephone Encounter (Signed)
Leda Min, RN  01/31/2020 8:17 AM EDT Back to Top    Left message to call Noreene Larsson, RN at [facility] [phone].

## 2020-02-02 LAB — ANTI MULLERIAN HORMONE: ANTI-MULLERIAN HORMONE (AMH): 0.04 ng/mL

## 2020-04-14 ENCOUNTER — Other Ambulatory Visit: Payer: Self-pay

## 2020-04-14 ENCOUNTER — Other Ambulatory Visit: Payer: Self-pay | Admitting: Obstetrics and Gynecology

## 2020-04-14 ENCOUNTER — Ambulatory Visit (INDEPENDENT_AMBULATORY_CARE_PROVIDER_SITE_OTHER): Payer: BC Managed Care – PPO | Admitting: Psychiatry

## 2020-04-14 DIAGNOSIS — Z780 Asymptomatic menopausal state: Secondary | ICD-10-CM

## 2020-04-14 DIAGNOSIS — F4010 Social phobia, unspecified: Secondary | ICD-10-CM | POA: Diagnosis not present

## 2020-04-14 DIAGNOSIS — Z62810 Personal history of physical and sexual abuse in childhood: Secondary | ICD-10-CM

## 2020-04-14 DIAGNOSIS — M797 Fibromyalgia: Secondary | ICD-10-CM

## 2020-04-14 DIAGNOSIS — Z92241 Personal history of systemic steroid therapy: Secondary | ICD-10-CM

## 2020-04-14 DIAGNOSIS — F3175 Bipolar disorder, in partial remission, most recent episode depressed: Secondary | ICD-10-CM

## 2020-04-14 DIAGNOSIS — F4310 Post-traumatic stress disorder, unspecified: Secondary | ICD-10-CM

## 2020-04-14 DIAGNOSIS — F5104 Psychophysiologic insomnia: Secondary | ICD-10-CM

## 2020-04-14 DIAGNOSIS — Z91410 Personal history of adult physical and sexual abuse: Secondary | ICD-10-CM

## 2020-04-14 DIAGNOSIS — Z62820 Parent-biological child conflict: Secondary | ICD-10-CM

## 2020-04-14 DIAGNOSIS — F1011 Alcohol abuse, in remission: Secondary | ICD-10-CM

## 2020-04-14 DIAGNOSIS — Z87898 Personal history of other specified conditions: Secondary | ICD-10-CM

## 2020-04-14 DIAGNOSIS — Z636 Dependent relative needing care at home: Secondary | ICD-10-CM

## 2020-04-14 NOTE — Telephone Encounter (Signed)
Medication refill request: Progesterone Last AEX:  10/21/19 Dr. Hyacinth Meeker Next AEX: none  Last MMG (if hormonal medication request): 09/05/19 Diagnostic MMG - BIRADS 1 negative/density c Refill authorized: today, please advise
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
## 2020-05-01 ENCOUNTER — Encounter: Payer: Self-pay | Admitting: Internal Medicine

## 2020-05-01 ENCOUNTER — Ambulatory Visit (INDEPENDENT_AMBULATORY_CARE_PROVIDER_SITE_OTHER): Payer: BC Managed Care – PPO | Admitting: Internal Medicine

## 2020-05-01 ENCOUNTER — Ambulatory Visit: Payer: BC Managed Care – PPO | Admitting: Nurse Practitioner

## 2020-05-01 ENCOUNTER — Encounter: Payer: Self-pay | Admitting: Nurse Practitioner

## 2020-05-01 ENCOUNTER — Other Ambulatory Visit: Payer: Self-pay

## 2020-05-01 VITALS — BP 110/70 | HR 68 | Resp 16 | Ht 65.0 in | Wt 153.0 lb

## 2020-05-01 VITALS — BP 102/68 | HR 93 | Temp 98.0°F | Ht 65.0 in | Wt 154.6 lb

## 2020-05-01 DIAGNOSIS — M797 Fibromyalgia: Secondary | ICD-10-CM | POA: Diagnosis not present

## 2020-05-01 DIAGNOSIS — F319 Bipolar disorder, unspecified: Secondary | ICD-10-CM

## 2020-05-01 DIAGNOSIS — N898 Other specified noninflammatory disorders of vagina: Secondary | ICD-10-CM | POA: Diagnosis not present

## 2020-05-01 DIAGNOSIS — F449 Dissociative and conversion disorder, unspecified: Secondary | ICD-10-CM

## 2020-05-01 DIAGNOSIS — K219 Gastro-esophageal reflux disease without esophagitis: Secondary | ICD-10-CM | POA: Diagnosis not present

## 2020-05-01 DIAGNOSIS — Z01419 Encounter for gynecological examination (general) (routine) without abnormal findings: Secondary | ICD-10-CM | POA: Diagnosis not present

## 2020-05-01 DIAGNOSIS — F411 Generalized anxiety disorder: Secondary | ICD-10-CM

## 2020-05-01 DIAGNOSIS — E781 Pure hyperglyceridemia: Secondary | ICD-10-CM

## 2020-05-01 DIAGNOSIS — N904 Leukoplakia of vulva: Secondary | ICD-10-CM

## 2020-05-01 DIAGNOSIS — N3010 Interstitial cystitis (chronic) without hematuria: Secondary | ICD-10-CM

## 2020-05-01 DIAGNOSIS — N951 Menopausal and female climacteric states: Secondary | ICD-10-CM

## 2020-05-01 DIAGNOSIS — Z0000 Encounter for general adult medical examination without abnormal findings: Secondary | ICD-10-CM

## 2020-05-01 LAB — POCT URINALYSIS DIPSTICK
Bilirubin, UA: NEGATIVE
Blood, UA: NEGATIVE
Glucose, UA: NEGATIVE
Ketones, UA: NEGATIVE
Leukocytes, UA: NEGATIVE
Nitrite, UA: NEGATIVE
Protein, UA: NEGATIVE
Urobilinogen, UA: NEGATIVE E.U./dL — AB
pH, UA: 5 (ref 5.0–8.0)

## 2020-05-01 MED ORDER — CLOBETASOL PROPIONATE 0.05 % EX OINT: TOPICAL_OINTMENT | CUTANEOUS | 0 refills | Status: DC

## 2020-05-01 MED ORDER — PROGESTERONE MICRONIZED 100 MG PO CAPS: ORAL_CAPSULE | ORAL | 4 refills | Status: DC

## 2020-05-01 MED ORDER — ESTRADIOL 0.05 MG/24HR TD PTTW: 1.0000 | MEDICATED_PATCH | TRANSDERMAL | 4 refills | Status: DC

## 2020-05-01 NOTE — Patient Instructions (Addendum)
May consider Prelief for help with IC   Interstitial Cystitis  Interstitial cystitis is inflammation of the bladder. This may cause pain in the bladder area as well as a frequent and urgent need to urinate. The bladder is a hollow organ in the lower part of the abdomen. It stores urine after the urine is made in the kidneys. The severity of interstitial cystitis can vary from person to person. You may have flare-ups, and then your symptoms may go away for a while. For many people, it becomes a long-term (chronic) problem. What are the causes? The cause of this condition is not known. What increases the risk? The following factors may make you more likely to develop this condition:  You are female.  You have fibromyalgia.  You have irritable bowel syndrome (IBS).  You have endometriosis. This condition may be aggravated by:  Stress.  Smoking.  Spicy foods. What are the signs or symptoms? Symptoms of interstitial cystitis vary, and they can change over time. Symptoms may include:  Discomfort or pain in the bladder area, which is in the lower abdomen. Pain can range from mild to severe. The pain may change in intensity as the bladder fills with urine or as it empties.  Pain in the pelvic area, between the hip bones.  An urgent need to urinate.  Frequent urination.  Pain during urination.  Pain during sex.  Blood in the urine. For women, symptoms often get worse during menstruation. How is this diagnosed? This condition is diagnosed based on your symptoms, your medical history, and a physical exam. You may have tests to rule out other conditions, such as:  Urine tests.  Cystoscopy. For this test, a tool similar to a very thin telescope is used to look into your bladder.  Biopsy. This involves taking a sample of tissue from the bladder to be examined under a microscope. How is this treated? There is no cure for this condition, but treatment can help you control your  symptoms. Work closely with your health care provider to find the most effective treatments for you. Treatment options may include:  Medicines to relieve pain and reduce how often you feel the need to urinate.  Learning ways to control when you urinate (bladder training).  Lifestyle changes, such as changing your diet or taking steps to control stress.  Using a device that provides electrical stimulation to your nerves, which can relieve pain (neuromodulation therapy). The device is placed on your back, where it blocks the nerves that cause you to feel pain in your bladder area.  A procedure that stretches your bladder by filling it with air or fluid.  Surgery. This is rare. It is only done for extreme cases, if other treatments do not help. Follow these instructions at home: Bladder training   Use bladder training techniques as directed. Techniques may include: ? Urinating at scheduled times. ? Training yourself to delay urination. ? Doing exercises (Kegel exercises) to strengthen the muscles that control urine flow.  Keep a bladder diary. ? Write down the times that you urinate and any symptoms that you have. This can help you find out which foods, liquids, or activities make your symptoms worse. ? Use your bladder diary to schedule bathroom trips. If you are away from home, plan to be near a bathroom at each of your scheduled times.  Make sure that you urinate just before you leave the house and just before you go to bed. Eating and drinking  Make dietary  changes as recommended by your health care provider. You may need to avoid: ? Spicy foods. ? Foods that contain a lot of potassium.  Limit your intake of beverages that make you need to urinate. These include: ? Caffeinated beverages like soda, coffee, and tea. ? Alcohol. General instructions  Take over-the-counter and prescription medicines only as told by your health care provider.  Do not drink alcohol.  You can try a  warm or cool compress over your bladder for comfort.  Avoid wearing tight clothing.  Do not use any products that contain nicotine or tobacco, such as cigarettes and e-cigarettes. If you need help quitting, ask your health care provider.  Keep all follow-up visits as told by your health care provider. This is important. Contact a health care provider if you have:  Symptoms that do not get better with treatment.  Pain or discomfort that gets worse.  More frequent urges to urinate.  A fever. Get help right away if:  You have no control over when you urinate. Summary  Interstitial cystitis is inflammation of the bladder.  This condition may cause pain in the bladder area as well as a frequent and urgent need to urinate.  You may have flare-ups of the condition, and then it may go away for a while. For many people, it becomes a long-term (chronic) problem.  There is no cure for interstitial cystitis, but treatment methods are available to control your symptoms. This information is not intended to replace advice given to you by your health care provider. Make sure you discuss any questions you have with your health care provider. Document Revised: 04/14/2017 Document Reviewed: 03/27/2017 Elsevier Patient Education  2020 [employer].   Health Maintenance for Postmenopausal Women Menopause is a normal process in which your ability to get pregnant comes to an end. This process happens slowly over many months or years, usually between the ages of 15 and 26. Menopause is complete when you have missed your menstrual periods for 12 months. It is important to talk with your health care provider about some of the most common conditions that affect women after menopause (postmenopausal women). These include heart disease, cancer, and bone loss (osteoporosis). Adopting a healthy lifestyle and getting preventive care can help to promote your health and wellness. The actions you take can also  lower your chances of developing some of these common conditions. What should I know about menopause? During menopause, you may get a number of symptoms, such as:  Hot flashes. These can be moderate or severe.  Night sweats.  Decrease in sex drive.  Mood swings.  Headaches.  Tiredness.  Irritability.  Memory problems.  Insomnia. Choosing to treat or not to treat these symptoms is a decision that you make with your health care provider. Do I need hormone replacement therapy?  Hormone replacement therapy is effective in treating symptoms that are caused by menopause, such as hot flashes and night sweats.  Hormone replacement carries certain risks, especially as you become older. If you are thinking about using estrogen or estrogen with progestin, discuss the benefits and risks with your health care provider. What is my risk for heart disease and stroke? The risk of heart disease, heart attack, and stroke increases as you age. One of the causes may be a change in the body's hormones during menopause. This can affect how your body uses dietary fats, triglycerides, and cholesterol. Heart attack and stroke are medical emergencies. There are many things that you can do  to help prevent heart disease and stroke. Watch your blood pressure  High blood pressure causes heart disease and increases the risk of stroke. This is more likely to develop in people who have high blood pressure readings, are of African descent, or are overweight.  Have your blood pressure checked: ? Every 3-5 years if you are 13-110 years of age. ? Every year if you are 58 years old or older. Eat a healthy diet   Eat a diet that includes plenty of vegetables, fruits, low-fat dairy products, and lean protein.  Do not eat a lot of foods that are high in solid fats, added sugars, or sodium. Get regular exercise Get regular exercise. This is one of the most important things you can do for your health. Most adults  should:  Try to exercise for at least 150 minutes each week. The exercise should increase your heart rate and make you sweat (moderate-intensity exercise).  Try to do strengthening exercises at least twice each week. Do these in addition to the moderate-intensity exercise.  Spend less time sitting. Even light physical activity can be beneficial. Other tips  Work with your health care provider to achieve or maintain a healthy weight.  Do not use any products that contain nicotine or tobacco, such as cigarettes, e-cigarettes, and chewing tobacco. If you need help quitting, ask your health care provider.  Know your numbers. Ask your health care provider to check your cholesterol and your blood sugar (glucose). Continue to have your blood tested as directed by your health care provider. Do I need screening for cancer? Depending on your health history and family history, you may need to have cancer screening at different stages of your life. This may include screening for:  Breast cancer.  Cervical cancer.  Lung cancer.  Colorectal cancer. What is my risk for osteoporosis? After menopause, you may be at increased risk for osteoporosis. Osteoporosis is a condition in which bone destruction happens more quickly than new bone creation. To help prevent osteoporosis or the bone fractures that can happen because of osteoporosis, you may take the following actions:  If you are 61-57 years old, get at least 1,000 mg of calcium and at least 600 mg of vitamin D per day.  If you are older than age 60 but younger than age 18, get at least 1,200 mg of calcium and at least 600 mg of vitamin D per day.  If you are older than age 59, get at least 1,200 mg of calcium and at least 800 mg of vitamin D per day. Smoking and drinking excessive alcohol increase the risk of osteoporosis. Eat foods that are rich in calcium and vitamin D, and do weight-bearing exercises several times each week as directed by your  health care provider. How does menopause affect my mental health? Depression may occur at any age, but it is more common as you become older. Common symptoms of depression include:  Low or sad mood.  Changes in sleep patterns.  Changes in appetite or eating patterns.  Feeling an overall lack of motivation or enjoyment of activities that you previously enjoyed.  Frequent crying spells. Talk with your health care provider if you think that you are experiencing depression. General instructions See your health care provider for regular wellness exams and vaccines. This may include:  Scheduling regular health, dental, and eye exams.  Getting and maintaining your vaccines. These include: ? Influenza vaccine. Get this vaccine each year before the flu season begins. ? Pneumonia  vaccine. ? Shingles vaccine. ? Tetanus, diphtheria, and pertussis (Tdap) booster vaccine. Your health care provider may also recommend other immunizations. Tell your health care provider if you have ever been abused or do not feel safe at home. Summary  Menopause is a normal process in which your ability to get pregnant comes to an end.  This condition causes hot flashes, night sweats, decreased interest in sex, mood swings, headaches, or lack of sleep.  Treatment for this condition may include hormone replacement therapy.  Take actions to keep yourself healthy, including exercising regularly, eating a healthy diet, watching your weight, and checking your blood pressure and blood sugar levels.  Get screened for cancer and depression. Make sure that you are up to date with all your vaccines. This information is not intended to replace advice given to you by your health care provider. Make sure you discuss any questions you have with your health care provider. Document Revised: 04/25/2018 Document Reviewed: 04/25/2018 Elsevier Patient Education  2020 [employer].
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
## 2020-05-01 NOTE — Patient Instructions (Signed)
-  Nice seeing you today!!  -Schedule follow up in 3 months for your physical. Please come in fasting that day. 

## 2020-05-02 LAB — VAGINITIS/VAGINOSIS, DNA PROBE
Candida Species: NEGATIVE
Gardnerella vaginalis: NEGATIVE
Trichomonas vaginosis: NEGATIVE

## 2020-05-04 ENCOUNTER — Other Ambulatory Visit: Payer: Self-pay | Admitting: Obstetrics and Gynecology

## 2020-05-04 ENCOUNTER — Other Ambulatory Visit: Payer: Self-pay | Admitting: Obstetrics & Gynecology

## 2020-05-04 DIAGNOSIS — N951 Menopausal and female climacteric states: Secondary | ICD-10-CM

## 2020-05-04 NOTE — Telephone Encounter (Signed)
Patient called [facility] and they don't have prescription for progesterone on file.

## 2020-05-04 NOTE — Telephone Encounter (Signed)
Call placed to [facility] to check on Rx. Spoke with [place]. States Rx progesterone was received and filled for pt and ready for pick up.  Call placed to pt. Spoke with pt. Pt aware of Rx refill ready at pharmacy. Thankful for call. Encounter closed  Duplicate Rx refill refused.

## 2020-05-14 ENCOUNTER — Ambulatory Visit: Payer: BC Managed Care – PPO | Admitting: Internal Medicine

## 2020-05-20 ENCOUNTER — Ambulatory Visit (INDEPENDENT_AMBULATORY_CARE_PROVIDER_SITE_OTHER): Payer: BC Managed Care – PPO | Admitting: Psychiatry

## 2020-05-20 ENCOUNTER — Other Ambulatory Visit: Payer: Self-pay

## 2020-05-20 DIAGNOSIS — M797 Fibromyalgia: Secondary | ICD-10-CM

## 2020-05-20 DIAGNOSIS — N3010 Interstitial cystitis (chronic) without hematuria: Secondary | ICD-10-CM

## 2020-05-20 DIAGNOSIS — Z62820 Parent-biological child conflict: Secondary | ICD-10-CM | POA: Diagnosis not present

## 2020-05-20 DIAGNOSIS — Z92241 Personal history of systemic steroid therapy: Secondary | ICD-10-CM

## 2020-05-20 DIAGNOSIS — Z780 Asymptomatic menopausal state: Secondary | ICD-10-CM | POA: Diagnosis not present

## 2020-05-20 DIAGNOSIS — Z636 Dependent relative needing care at home: Secondary | ICD-10-CM

## 2020-05-20 DIAGNOSIS — F3175 Bipolar disorder, in partial remission, most recent episode depressed: Secondary | ICD-10-CM

## 2020-05-20 DIAGNOSIS — F4310 Post-traumatic stress disorder, unspecified: Secondary | ICD-10-CM

## 2020-05-20 DIAGNOSIS — Z62810 Personal history of physical and sexual abuse in childhood: Secondary | ICD-10-CM

## 2020-05-20 DIAGNOSIS — F1011 Alcohol abuse, in remission: Secondary | ICD-10-CM

## 2020-05-20 DIAGNOSIS — Z91410 Personal history of adult physical and sexual abuse: Secondary | ICD-10-CM

## 2020-05-20 DIAGNOSIS — K219 Gastro-esophageal reflux disease without esophagitis: Secondary | ICD-10-CM

## 2020-05-20 DIAGNOSIS — Z87898 Personal history of other specified conditions: Secondary | ICD-10-CM
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
## 2020-05-21 NOTE — Patient Instructions (Signed)
Pt will use vaseline/xeroform/gauze dressing for approx 2 weeks She understands that the ink/color may fade up to 30%- requiring possible touch-up procedure as needed. She will call for any concerns- otherwise f/u in 6 weeks.

## 2020-05-21 NOTE — Progress Notes (Signed)
NIPPLE AREOLAR TATTOO PROCEDURE  PREOPERATIVE DIAGNOSIS:  (right) nipple areolar loss of pigment   POSTOPERATIVE DIAGNOSIS: (Right) nipple areolar loss of pigment   PROCEDURES:  ( right) nipple areolar tattoo   ATTENDING SURGEON: Dr. Arita Miss  ANESTHESIA:  None/ pt allergic to Lidocaine  COMPLICATIONS: None.  JUSTIFICATION FOR PROCEDURE:  Erika Mccoy is a 55 y.o. female with a history of status post bilateral breast augmentation with implants and then bilateral implant replacement with mastopexy in approximately 2019.  The patient presents for right nipple areolar complex tattoo d/t loss of pigment.  Risks, benefits, indications, and alternatives of the above described procedures were discussed with the patient and all the patient's questions were answered.   DESCRIPTION OF PROCEDURE: After informed consent was obtained and proper identification of patient and surgical site was made, the patient was taken to the procedure room and pre-procedure photos were taken.  Colors/shades were decided by patient. The patient was then placed supine on the operating room table. A time out was performed to confirm patient's identity and surgical site. The patient was prepped and draped in the usual sterile fashion.  using a #7 tattoo needle on the Bomtec/Digital tattoo machine color was instilled.   Once adequate pigment had been applied to the right nipple areolar complex, a post-procedure photo was obtained and entered into chart.  Vaseline/ xeroform & gauze dressing was applied.  The patient tolerated the procedure well.  Post procedure care instructions were reviewed with pt. World Famous Ink used: Tan Honey lot# [id number] / exp. 04/24/22 Fair Peach-lot#[id number] / exp. 04/24/22 Drue Stager Skin- lot# [id number] / exp. 12/29/22 Dark Mink- lot# [id number]/ exp. 04/24/22

## 2020-05-26 ENCOUNTER — Ambulatory Visit: Payer: BC Managed Care – PPO | Admitting: Psychiatry

## 2020-06-02 ENCOUNTER — Ambulatory Visit: Payer: BC Managed Care – PPO | Admitting: Psychiatry

## 2020-06-02 NOTE — Progress Notes (Deleted)
Psychotherapy Progress Note [facility] [name], PhD LP  Patient ID: Erika Mccoy     MRN: [id number] Therapy format: {Therapy Types:21967::"Individual psychotherapy"} Date: 06/02/2020      Start: ***:***     Stop: ***:***     Time Spent: *** min Location: {SvcLoc:22530::"In-person"}   Session narrative (presenting needs, interim history, self-report of stressors and symptoms, applications of prior therapy, status changes, and interventions made in session) ***  Therapeutic modalities: {AM:23362::"Cognitive Behavioral Therapy","Solution-Oriented/Positive Psychology"}  Mental Status/Observations:  Appearance:   {PSY:22683}     Behavior:  {PSY:21022743}  Motor:  {PSY:22302}  Speech/Language:   {PSY:22685}  Affect:  {PSY:22687}  Mood:  {PSY:31886}  Thought process:  {PSY:31888}  Thought content:    {PSY:[phone]}  Sensory/Perceptual disturbances:    {[phone]}  Orientation:  {Psych Orientation:23301::"Fully oriented"}  Attention:  {Good-Fair-Poor ratings:23770::"Good"}    Concentration:  {Good-Fair-Poor ratings:23770::"Good"}  Memory:  {PSY:[phone]}  Insight:    {Good-Fair-Poor ratings:23770::"Good"}  Judgment:   {Good-Fair-Poor ratings:23770::"Good"}  Impulse Control:  {Good-Fair-Poor ratings:23770::"Good"}   Risk Assessment: Danger to Self: {Risk:22599::"No"} Self-injurious Behavior: {Risk:22599::"No"} Danger to Others: {Risk:22599::"No"} Physical Aggression / Violence: {Risk:22599::"No"} Duty to Warn: {AMYesNo:22526::"No"} Access to Firearms a concern: {AMYesNo:22526::"No"}  Assessment of progress:  {Progress:22147::"progressing"}  Diagnosis: No diagnosis found. Plan:  . *** . Other recommendations/advice as may be noted above . Continue to utilize previously learned skills ad lib . Maintain medication as prescribed and work faithfully with relevant prescriber(s) if any changes are desired or seem indicated . Call the  clinic on-call service, present to ER, or call 911 if any life-threatening psychiatric crisis . No follow-ups on file. . Already scheduled visit in this office 06/09/2020.  Robley Fries, PhD [name], PhD LP Clinical Psychologist, [facility] [address], [address] [place], [place] [address] [phone]

## 2020-06-09 ENCOUNTER — Ambulatory Visit (INDEPENDENT_AMBULATORY_CARE_PROVIDER_SITE_OTHER): Payer: BC Managed Care – PPO | Admitting: Psychiatry

## 2020-06-09 ENCOUNTER — Other Ambulatory Visit: Payer: Self-pay

## 2020-06-09 DIAGNOSIS — Z91410 Personal history of adult physical and sexual abuse: Secondary | ICD-10-CM

## 2020-06-09 DIAGNOSIS — Z92241 Personal history of systemic steroid therapy: Secondary | ICD-10-CM

## 2020-06-09 DIAGNOSIS — Z636 Dependent relative needing care at home: Secondary | ICD-10-CM

## 2020-06-09 DIAGNOSIS — K589 Irritable bowel syndrome without diarrhea: Secondary | ICD-10-CM

## 2020-06-09 DIAGNOSIS — F4310 Post-traumatic stress disorder, unspecified: Secondary | ICD-10-CM | POA: Diagnosis not present

## 2020-06-09 DIAGNOSIS — F1011 Alcohol abuse, in remission: Secondary | ICD-10-CM

## 2020-06-09 DIAGNOSIS — Z87898 Personal history of other specified conditions: Secondary | ICD-10-CM

## 2020-06-09 DIAGNOSIS — M797 Fibromyalgia: Secondary | ICD-10-CM

## 2020-06-09 DIAGNOSIS — F3175 Bipolar disorder, in partial remission, most recent episode depressed: Secondary | ICD-10-CM

## 2020-06-09 DIAGNOSIS — Z62810 Personal history of physical and sexual abuse in childhood: Secondary | ICD-10-CM

## 2020-06-09 DIAGNOSIS — F4320 Adjustment disorder, unspecified: Secondary | ICD-10-CM
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
## 2020-06-16 ENCOUNTER — Ambulatory Visit: Payer: BC Managed Care – PPO | Admitting: Psychiatry

## 2020-07-14 ENCOUNTER — Other Ambulatory Visit: Payer: Self-pay

## 2020-07-14 ENCOUNTER — Ambulatory Visit (INDEPENDENT_AMBULATORY_CARE_PROVIDER_SITE_OTHER): Payer: BC Managed Care – PPO | Admitting: Psychiatry

## 2020-07-14 DIAGNOSIS — F4310 Post-traumatic stress disorder, unspecified: Secondary | ICD-10-CM

## 2020-07-14 DIAGNOSIS — F4320 Adjustment disorder, unspecified: Secondary | ICD-10-CM

## 2020-07-14 DIAGNOSIS — K589 Irritable bowel syndrome without diarrhea: Secondary | ICD-10-CM

## 2020-07-14 DIAGNOSIS — F1010 Alcohol abuse, uncomplicated: Secondary | ICD-10-CM

## 2020-07-14 DIAGNOSIS — F3175 Bipolar disorder, in partial remission, most recent episode depressed: Secondary | ICD-10-CM

## 2020-07-14 DIAGNOSIS — Z630 Problems in relationship with spouse or partner: Secondary | ICD-10-CM

## 2020-07-14 DIAGNOSIS — Z636 Dependent relative needing care at home: Secondary | ICD-10-CM | POA: Diagnosis not present

## 2020-07-14 DIAGNOSIS — Z62810 Personal history of physical and sexual abuse in childhood: Secondary | ICD-10-CM

## 2020-07-14 DIAGNOSIS — F1210 Cannabis abuse, uncomplicated: Secondary | ICD-10-CM

## 2020-07-14 DIAGNOSIS — M797 Fibromyalgia: Secondary | ICD-10-CM

## 2020-07-14 DIAGNOSIS — Z91410 Personal history of adult physical and sexual abuse: Secondary | ICD-10-CM
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
## 2020-07-21 ENCOUNTER — Ambulatory Visit (INDEPENDENT_AMBULATORY_CARE_PROVIDER_SITE_OTHER): Payer: BC Managed Care – PPO | Admitting: Psychiatry

## 2020-07-21 ENCOUNTER — Other Ambulatory Visit: Payer: Self-pay

## 2020-07-21 DIAGNOSIS — F3175 Bipolar disorder, in partial remission, most recent episode depressed: Secondary | ICD-10-CM | POA: Diagnosis not present

## 2020-07-21 DIAGNOSIS — Z91410 Personal history of adult physical and sexual abuse: Secondary | ICD-10-CM

## 2020-07-21 DIAGNOSIS — Z634 Disappearance and death of family member: Secondary | ICD-10-CM

## 2020-07-21 DIAGNOSIS — F4320 Adjustment disorder, unspecified: Secondary | ICD-10-CM

## 2020-07-21 DIAGNOSIS — M797 Fibromyalgia: Secondary | ICD-10-CM

## 2020-07-21 DIAGNOSIS — Z62810 Personal history of physical and sexual abuse in childhood: Secondary | ICD-10-CM

## 2020-07-21 DIAGNOSIS — F4310 Post-traumatic stress disorder, unspecified: Secondary | ICD-10-CM | POA: Diagnosis not present

## 2020-07-21 DIAGNOSIS — Z62820 Parent-biological child conflict: Secondary | ICD-10-CM | POA: Diagnosis not present

## 2020-07-21 DIAGNOSIS — F5104 Psychophysiologic insomnia: Secondary | ICD-10-CM

## 2020-07-21 DIAGNOSIS — F1010 Alcohol abuse, uncomplicated: Secondary | ICD-10-CM

## 2020-07-21 DIAGNOSIS — F4010 Social phobia, unspecified: Secondary | ICD-10-CM

## 2020-07-21 DIAGNOSIS — K589 Irritable bowel syndrome without diarrhea: Secondary | ICD-10-CM
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
## 2020-07-28 ENCOUNTER — Ambulatory Visit: Payer: BC Managed Care – PPO | Admitting: Psychiatry

## 2020-08-04 ENCOUNTER — Encounter: Payer: BC Managed Care – PPO | Admitting: Internal Medicine

## 2020-08-04 ENCOUNTER — Ambulatory Visit: Payer: BC Managed Care – PPO | Admitting: Psychiatry

## 2020-08-11 ENCOUNTER — Other Ambulatory Visit: Payer: Self-pay

## 2020-08-11 ENCOUNTER — Ambulatory Visit (INDEPENDENT_AMBULATORY_CARE_PROVIDER_SITE_OTHER): Payer: BC Managed Care – PPO | Admitting: Psychiatry

## 2020-08-11 DIAGNOSIS — F4320 Adjustment disorder, unspecified: Secondary | ICD-10-CM

## 2020-08-11 DIAGNOSIS — F4010 Social phobia, unspecified: Secondary | ICD-10-CM

## 2020-08-11 DIAGNOSIS — Z62820 Parent-biological child conflict: Secondary | ICD-10-CM | POA: Diagnosis not present

## 2020-08-11 DIAGNOSIS — F3175 Bipolar disorder, in partial remission, most recent episode depressed: Secondary | ICD-10-CM | POA: Diagnosis not present

## 2020-08-11 DIAGNOSIS — F4310 Post-traumatic stress disorder, unspecified: Secondary | ICD-10-CM

## 2020-08-11 DIAGNOSIS — Z634 Disappearance and death of family member: Secondary | ICD-10-CM

## 2020-08-11 NOTE — Progress Notes (Signed)
Psychotherapy Progress Note [facility] [name], PhD LP  Patient ID: Erika Mccoy     MRN: [id number] Therapy format: Individual psychotherapy Date: 08/11/2020      Start: 3:12p     Stop: 4:02p     Time Spent: 50 min Location: In-person   Session narrative (presenting needs, interim history, self-report of stressors and symptoms, applications of prior therapy, status changes, and interventions made in session) Called off last time for having an intense grief attack, having seen a favorite item of mother's.  Got mad at God, still feels it.  Tried 3 prazosin but BP was too low, got woozy.  Finds herself waking middle of the night, thinking again.    Erasmo Score was helpful.  Aunt Tibby caught her sideways, proclaiming of Erskine Squibb, "She was my mother", and momentarily felt rivalry, but allowed that her mother really did look out for Tibby, too in their upbringing.  [name], who has been able to be astoundingly cold to [place], cried at the funeral -- a ray of hope for her psychologically, and her unclear combination of substance abuse, bipolar, and personality disordered symptoms.  Now finding blessings all over in the wake of mother's passing, including closer relations with cousin, cousin's wife, and stepB Robbie.  More love and understanding for and from sons and D as well.  Moved to hear [name] ask, nicely, for some items to have -- a quantum leap above past demandingness and outright theft.  Now planning a honeymoon with [name] in [place].  Notes intense separation anxiety when [name] leaves the house.  Recalls she used to have a security blanket with a satin edge, and rubbing it made so much difference.  Father would even travel back to town to get it if forgotten.  Wonders if it's crazy to want one of those again.  Assured it's absolutely normal, the cry of the heart, especially given her experiences in life, the fresh loss of her larger-than-life, be-all-end-all mother, and for  how vivid a channel touch has always been for her.  Agreed she will go buy herself one and make it an explicit gift to herself.  Until then, discussed appropriating one of [name]'s shirts, which would have his scent, too, presumably.  Therapeutic modalities: Cognitive Behavioral Therapy, Solution-Oriented/Positive Psychology, and Ego-Supportive  Mental Status/Observations:  Appearance:   Casual     Behavior:  Appropriate  Motor:  Normal  Speech/Language:   Clear and Coherent  Affect:  Appropriate  Mood:  anxious and sad but responsive  Thought process:  normal  Thought content:    WNL  Sensory/Perceptual disturbances:    WNL  Orientation:  Fully oriented  Attention:  Good    Concentration:  Fair  Memory:  WNL  Insight:    Good  Judgment:   Good  Impulse Control:  Fair   Risk Assessment: Danger to Self: No Self-injurious Behavior: No Danger to Others: No Physical Aggression / Violence: No Duty to Warn: No Access to Firearms a concern: No  Assessment of progress:  progressing  Diagnosis:   ICD-10-CM   1. Bereavement reaction  F43.20    Z63.4     2. PTSD (post-traumatic stress disorder) Inactive F43.10     3. Bipolar 1 disorder, depressed, partial remission (HCC)  F31.75     4. Relationship problem between parent and child  Z62.820     5. Social anxiety disorder Active F40.10    with separation anxiety     Plan:  Appropriate one  of [name]'s shirts as contact and olfactory comfort.  Option to buy a security blanket. Continue to affirm warm changes in relationship with daughter Continue to grieve as moved, ready to self-remind that she did very well by her mother, final analysis, and it's OK to feel intensely -- still normal Try as able to brave agoraphobia in small measures, whatever she can find May reconsider alpha blocker at another time Other recommendations/advice as may be noted above Continue to utilize previously learned skills ad lib Maintain medication as  prescribed and work faithfully with relevant prescriber(s) if any changes are desired or seem indicated Call the clinic on-call service, present to ER, or call 911 if any life-threatening psychiatric crisis Return 2-4 wks as able. Already scheduled visit in this office Visit date not found.  Robley Fries, PhD [name], PhD LP Clinical Psychologist, [facility] [address] [place], [place] [address] [phone]

## 2020-08-12 ENCOUNTER — Other Ambulatory Visit: Payer: Self-pay | Admitting: Obstetrics and Gynecology

## 2020-08-12 DIAGNOSIS — Z1231 Encounter for screening mammogram for malignant neoplasm of breast: Secondary | ICD-10-CM

## 2020-09-09 ENCOUNTER — Ambulatory Visit (INDEPENDENT_AMBULATORY_CARE_PROVIDER_SITE_OTHER): Payer: BC Managed Care – PPO | Admitting: Psychiatry

## 2020-09-09 ENCOUNTER — Other Ambulatory Visit: Payer: Self-pay

## 2020-09-09 DIAGNOSIS — F4320 Adjustment disorder, unspecified: Secondary | ICD-10-CM

## 2020-09-09 DIAGNOSIS — Z634 Disappearance and death of family member: Secondary | ICD-10-CM

## 2020-09-09 DIAGNOSIS — F4310 Post-traumatic stress disorder, unspecified: Secondary | ICD-10-CM

## 2020-09-09 DIAGNOSIS — F3130 Bipolar disorder, current episode depressed, mild or moderate severity, unspecified: Secondary | ICD-10-CM | POA: Diagnosis not present

## 2020-09-09 DIAGNOSIS — F1010 Alcohol abuse, uncomplicated: Secondary | ICD-10-CM | POA: Diagnosis not present

## 2020-09-09 NOTE — Progress Notes (Signed)
Psychotherapy Progress Note [facility] [name], PhD LP  Patient ID: Erika Mccoy     MRN: [id number] Therapy format: Individual psychotherapy Date: 09/09/2020      Start: 9:10a     Stop: 9:58a     Time Spent: 48 min Location: In-person   Session narrative (presenting needs, interim history, self-report of stressors and symptoms, applications of prior therapy, status changes, and interventions made in session) Knows she's grieving, but feels there's more to it.  Not getting out of bed most of the day now.  Did get her blanket with the satin edge, and it is comforting, has been cocooning into it more.  Otherwise cries daily.    Estate issues are causing 90-day delay to transfer of assets, somebody dropped the ball, which also entails new expenses for attorney, having to work with the [employer], and an interruption in income, which she has been accustomed to in [employer] life.  The shortfall is coverable, by borrowing small amounts from home equity.    Drinking is down since mother's death, nothing excessive or intoxicating.  Affirmed and encouraged.  [name] tends to [employer] on phone calls and divide her attention, question her aunt's work as executrix.  Gets annoying, but working on shaping the behavior.    Hydrating well but spring allergies roaring.   Interpreted combination of grieving, anxiety about financial security, spring allergies, rare difficulty with [name]'s habits, and underlying inflammatory disease syndrome as simultaneous triggers to shut down, try to numb out.  Applauded self-control with alcohol  Therapeutic modalities: Cognitive Behavioral Therapy, Solution-Oriented/Positive Psychology, and Ego-Supportive  Mental Status/Observations:  Appearance:   Casual     Behavior:  Appropriate  Motor:  Normal  Speech/Language:   Clear and Coherent  Affect:  Appropriate  Mood:  anxious and depressed  Thought process:  normal  Thought content:     WNL  Sensory/Perceptual disturbances:    WNL  Orientation:  Fully oriented  Attention:  Good    Concentration:  Fair  Memory:  WNL  Insight:    Good  Judgment:   Good  Impulse Control:  Good   Risk Assessment: Danger to Self: No Self-injurious Behavior: No Danger to Others: No Physical Aggression / Violence: No Duty to Warn: No Access to Firearms a concern: No  Assessment of progress:  stabilized  Diagnosis:   ICD-10-CM   1. Bipolar I disorder, most recent episode depressed (HCC)  F31.30     2. Bereavement reaction  F43.20    Z63.4     3. PTSD (post-traumatic stress disorder)  F43.10     4. Alcohol abuse, episodic drinking behavior Inactive F10.10      Plan:  Continue to grieve as moved, ready to self-remind that she did very well by her mother, final analysis, and it's OK to feel intensely -- still normal Try as able to brave agoraphobia in small measures, whatever she can find Self-affirm normal reasons  Other recommendations/advice as may be noted above Continue to utilize previously learned skills ad lib Maintain medication as prescribed and work faithfully with relevant prescriber(s) if any changes are desired or seem indicated Call the clinic on-call service, present to ER, or call 911 if any life-threatening psychiatric crisis Return for session(s) already scheduled. Already scheduled visit in this office 09/14/2020.  Robley Fries, PhD [name], PhD LP Clinical Psychologist, [facility] [address], [address] [place], [place] [address] [phone]

## 2020-09-14 ENCOUNTER — Ambulatory Visit: Payer: BC Managed Care – PPO | Admitting: Psychiatry

## 2020-09-30 ENCOUNTER — Ambulatory Visit (INDEPENDENT_AMBULATORY_CARE_PROVIDER_SITE_OTHER): Payer: BC Managed Care – PPO | Admitting: Psychiatry

## 2020-09-30 ENCOUNTER — Other Ambulatory Visit: Payer: Self-pay

## 2020-09-30 DIAGNOSIS — F411 Generalized anxiety disorder: Secondary | ICD-10-CM

## 2020-09-30 DIAGNOSIS — Z634 Disappearance and death of family member: Secondary | ICD-10-CM

## 2020-09-30 DIAGNOSIS — F4010 Social phobia, unspecified: Secondary | ICD-10-CM

## 2020-09-30 DIAGNOSIS — Z638 Other specified problems related to primary support group: Secondary | ICD-10-CM

## 2020-09-30 DIAGNOSIS — F3175 Bipolar disorder, in partial remission, most recent episode depressed: Secondary | ICD-10-CM

## 2020-09-30 DIAGNOSIS — F4320 Adjustment disorder, unspecified: Secondary | ICD-10-CM

## 2020-09-30 NOTE — Progress Notes (Signed)
Psychotherapy Progress Note [facility] 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    Relations with some of [name]'s family (son, daughter, former MIL) have soured, feels just about entirely unwilling to go, does not want to brave the possibilities of feeling ostracized, know she's getting talked about again, and has been losing sleep over the dilemma.  Offended and demeaned hearing things that have been said about her, including the refuted, 89-year-old rumor that she stripped at the family gathering, and allegation that she is gold-digging with [name] -- which is preposterous, since she is now more or less independently wealthy with mother's estate, and has been willing to sign a Electronics engineer.    Reframed and interpreted dilemma, emphasized reasons to trust [name], validated the message that she is too painted, and several family members apparently already closed about accepting her.  If it is important to [name] for her to brave potential to repeat the shunning and judgment she's known, it's worth hearing out his desires and problem-solving how to build bridges with his family members.  Advised 1:1 or otherwise smaller encounters.  If at any time affronted, grant the accuser the right to think/believe/say, invite them to consider otherwise, and defer to [name] to lobby or negotiate rapport with his own family.  Therapeutic modalities: Cognitive Behavioral Therapy, Solution-Oriented/Positive Psychology, Ego-Supportive and Assertiveness/Communication  Mental Status/Observations:  Appearance:    Casual     Behavior:  Appropriate  Motor:  Normal  Speech/Language:   Clear and Coherent  Affect:  Appropriate  Mood:  anxious, inhibited; responsive to TX  Thought process:  normal  Thought content:    WNL and mild rumination  Sensory/Perceptual disturbances:    WNL  Orientation:  Fully oriented  Attention:  Good    Concentration:  Fair  Memory:  WNL  Insight:    Fair  Judgment:   Good  Impulse Control:  Good   Risk Assessment: Danger to Self: No Self-injurious Behavior: No Danger to Others: No Physical Aggression / Violence: No Duty to Warn: No Access to Firearms a concern: No  Assessment of progress:  situational setback(s)  Diagnosis:   ICD-10-CM   1. Social anxiety disorder  F40.10   2. Generalized anxiety disorder  F41.1   3. Relationship problem with family member  Z63.8   4. Bipolar 1 disorder, depressed, partial remission (HCC)  F31.75   5. Bereavement reaction  F43.20    Z63.4    Plan:  . Encourage to lay down ruminating about family responses to her, trust the relationship and regard Gabriel Rung has for her as genuine and malleable  . Problem-solve with Homero Fellers alternative ways to build rapport with his critical family -- smaller encounters, willingness to sign away interest in family property,  . Other recommendations/advice as may be noted above . Continue to utilize previously learned skills ad lib . Maintain medication as prescribed and work faithfully with relevant prescriber(s) if any changes are desired or seem indicated . Call the clinic on-call service, present to ER, or call 911 if any life-threatening psychiatric crisis Return in about 2 weeks (  around 10/14/2020) for time as available. . Already scheduled visit in this office 10/06/2020.  Robley Fries, PhD [name], PhD LP Clinical Psychologist, [facility] [address], [address] [place], [place] [address] [phone]

## 2020-10-02 ENCOUNTER — Ambulatory Visit: Payer: BC Managed Care – PPO

## 2020-10-03 ENCOUNTER — Ambulatory Visit: Payer: BC Managed Care – PPO

## 2020-10-06 ENCOUNTER — Ambulatory Visit (INDEPENDENT_AMBULATORY_CARE_PROVIDER_SITE_OTHER): Payer: BC Managed Care – PPO | Admitting: Psychiatry

## 2020-10-06 ENCOUNTER — Other Ambulatory Visit: Payer: Self-pay

## 2020-10-06 DIAGNOSIS — Z87898 Personal history of other specified conditions: Secondary | ICD-10-CM

## 2020-10-06 DIAGNOSIS — F4310 Post-traumatic stress disorder, unspecified: Secondary | ICD-10-CM

## 2020-10-06 DIAGNOSIS — F4010 Social phobia, unspecified: Secondary | ICD-10-CM | POA: Diagnosis not present

## 2020-10-06 DIAGNOSIS — F3175 Bipolar disorder, in partial remission, most recent episode depressed: Secondary | ICD-10-CM | POA: Diagnosis not present

## 2020-10-06 DIAGNOSIS — F411 Generalized anxiety disorder: Secondary | ICD-10-CM

## 2020-10-06 DIAGNOSIS — Z638 Other specified problems related to primary support group: Secondary | ICD-10-CM

## 2020-10-06 DIAGNOSIS — F1010 Alcohol abuse, uncomplicated: Secondary | ICD-10-CM

## 2020-10-06 NOTE — Progress Notes (Signed)
Psychotherapy Progress Note [facility] [name], PhD LP  Patient ID: Sheila Ocasio     MRN: [id number] Therapy format: Individual psychotherapy Date: 10/06/2020      Start: 2:10p     Stop: 3:00p     Time Spent: 50 min Location: In-person   Session narrative (presenting needs, interim history, self-report of stressors and symptoms, applications of prior therapy, status changes, and interventions made in session) Inappropriately dressed for the weather -- missed weather report.    Decided to go on family [place] trip anyway, with conviction not to let "them" "win" by getting her to peel away from [name]'s presence.  Queried as to positive vs. negative reasons, largely positive.  Was able to spend some time playing with the baby, show the united front she meant to vs. the rumors about her, and, side benefit, show up the younger parents at how to take care of little ones.  Did not feel the nervous urge to make sure she spoke to everyone first.  Successfully went through without any further rumor mongering about her.  Challenged to consider that she could be enough, have enough self-control, and be legitimate without the old tendency to overmedicate to cope.  Admits she was "heavily medicated", including extra Klonopin plus having gone up on Vraylar to 3mg .  Differentially reinforced leaning on the antipsychotic, given hx of benzo tolerance, and encouraged to keep probing the limits of what she can do without automatically resorting to sedation.  New issue psychiatrist may be more mobile than she was, since H died.    Bothered by the information load of dealing with the trust now in place of mother.  Intrusive memory last week of M near her end, needed to look at pictures to get grounded.  Normalized as dream material, if leaking into waking anxieties.  Encouraged in best sleep.  Therapeutic modalities: Cognitive Behavioral Therapy, Solution-Oriented/Positive Psychology,  and Ego-Supportive  Mental Status/Observations:  Appearance:   Casual     Behavior:  Appropriate  Motor:  Normal  Speech/Language:   Clear and Coherent  Affect:  Appropriate  Mood:  anxious and responsive  Thought process:  normal  Thought content:    WNL  Sensory/Perceptual disturbances:    WNL  Orientation:  Fully oriented  Attention:  Good    Concentration:  Fair  Memory:  WNL  Insight:    Good  Judgment:   Good  Impulse Control:  Fair   Risk Assessment: Danger to Self: No Self-injurious Behavior: No Danger to Others: No Physical Aggression / Violence: No Duty to Warn: No Access to Firearms a concern: No  Assessment of progress:  progressing  Diagnosis:   ICD-10-CM   1. Generalized anxiety disorder  F41.1     2. Social anxiety disorder  F40.10     3. PTSD (post-traumatic stress disorder)  F43.10     4. Bipolar 1 disorder, depressed, partial remission (HCC)  F31.75     5. Alcohol abuse, episodic drinking behavior  F10.10     6. History of benzodiazepine problem  Z87.898     7. Relationship problem with family member  Z63.8      Plan:  Tend sleep Try to maintain retrained Klonopin use and restrained alcohol Address suspected loss of psychiatrist with her, seek recommendations/referral.  Can female referral here if needed. Other recommendations/advice as may be noted above Continue to utilize previously learned skills ad lib Maintain medication as prescribed and work faithfully with relevant prescriber(s) if  any changes are desired or seem indicated Call the clinic on-call service, present to ER, or call 911 if any life-threatening psychiatric crisis Return for time as available. Already scheduled visit in this office Visit date not found.  Robley Fries, PhD [name], PhD LP Clinical Psychologist, [facility] [address] [place], [place] [address] [phone]

## 2020-10-14 ENCOUNTER — Other Ambulatory Visit: Payer: Self-pay

## 2020-10-14 ENCOUNTER — Ambulatory Visit
Admission: RE | Admit: 2020-10-14 | Discharge: 2020-10-14 | Disposition: A | Discharge status: Home / Self Care | Payer: BC Managed Care – PPO | Source: Ambulatory Visit | Attending: Obstetrics and Gynecology | Admitting: Obstetrics and Gynecology

## 2020-10-14 DIAGNOSIS — Z1231 Encounter for screening mammogram for malignant neoplasm of breast: Secondary | ICD-10-CM
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
## 2020-10-22 ENCOUNTER — Encounter: Payer: Self-pay | Admitting: Obstetrics and Gynecology

## 2020-10-22 ENCOUNTER — Ambulatory Visit: Payer: BC Managed Care – PPO | Admitting: Obstetrics and Gynecology

## 2020-10-22 ENCOUNTER — Other Ambulatory Visit: Payer: Self-pay

## 2020-10-22 VITALS — BP 110/74 | HR 90 | Resp 16 | Wt 156.0 lb

## 2020-10-22 DIAGNOSIS — N9489 Other specified conditions associated with female genital organs and menstrual cycle: Secondary | ICD-10-CM | POA: Diagnosis not present

## 2020-10-22 DIAGNOSIS — Z634 Disappearance and death of family member: Secondary | ICD-10-CM | POA: Diagnosis not present

## 2020-10-22 DIAGNOSIS — R5383 Other fatigue: Secondary | ICD-10-CM

## 2020-10-23 ENCOUNTER — Telehealth: Payer: Self-pay | Admitting: *Deleted

## 2020-10-23 NOTE — Telephone Encounter (Signed)
Pt called complaining  of weight gain as well as dizziness . Pt states she does not want to gain weight so she limits the amount of food she consumes.   Breakfast  Eggs with cheese  Lunch No lunch  Dinner: [employer] out of can  I asked patient do she ever feel like hurting herself she states that has passed she no longer feels that way. Pt states she has upcoming appt with her Counselor.  Will route information to Dr. Edward Jolly for advice.

## 2020-10-26 NOTE — Telephone Encounter (Signed)
I have reviewed the patient's blood work which is normal.   Please see if the lab can add a vitamin D level to what was already drawn.  This has been low in the past for the patient.   I recommend she increase her fluid and food intake and take a multivitamin if she is not already doing this.  She may be dizzy because she is not receiving enough hydration and calories from food.  Please make a referral to a dietician if she would like this.  If her dizziness does not improve, I recommend she see her PCP.   Once the dizziness resolves, increasing her physical activity should help to control her weight.

## 2020-10-26 NOTE — Telephone Encounter (Signed)
Lab sent to our Lab tech for add on vitamin D.Left message for pt to return our call

## 2020-10-28 LAB — COMPREHENSIVE METABOLIC PANEL
AG Ratio: 1.6 (calc) (ref 1.0–2.5)
ALT: 12 U/L (ref 6–29)
AST: 12 U/L (ref 10–35)
Albumin: 4.1 g/dL (ref 3.6–5.1)
Alkaline phosphatase (APISO): 60 U/L (ref 37–153)
BUN: 10 mg/dL (ref 7–25)
CO2: 26 mmol/L (ref 20–32)
Calcium: 9 mg/dL (ref 8.6–10.4)
Chloride: 102 mmol/L (ref 98–110)
Creat: 0.81 mg/dL (ref 0.50–1.05)
Globulin: 2.5 g/dL (calc) (ref 1.9–3.7)
Glucose, Bld: 92 mg/dL (ref 65–99)
Potassium: 4.3 mmol/L (ref 3.5–5.3)
Sodium: 138 mmol/L (ref 135–146)
Total Bilirubin: 0.3 mg/dL (ref 0.2–1.2)
Total Protein: 6.6 g/dL (ref 6.1–8.1)

## 2020-10-28 LAB — IRON: Iron: 55 ug/dL (ref 45–160)

## 2020-10-28 LAB — CBC
HCT: 40.3 % (ref 35.0–45.0)
Hemoglobin: 13.3 g/dL (ref 11.7–15.5)
MCH: 29.8 pg (ref 27.0–33.0)
MCHC: 33 g/dL (ref 32.0–36.0)
MCV: 90.2 fL (ref 80.0–100.0)
MPV: 9.7 fL (ref 7.5–12.5)
Platelets: 242 10*3/uL (ref 140–400)
RBC: 4.47 10*6/uL (ref 3.80–5.10)
RDW: 12.8 % (ref 11.0–15.0)
WBC: 6.1 10*3/uL (ref 3.8–10.8)

## 2020-10-28 LAB — VITAMIN B12: Vitamin B-12: 336 pg/mL (ref 200–1100)

## 2020-10-28 LAB — FERRITIN: Ferritin: 22 ng/mL (ref 16–232)

## 2020-10-28 LAB — VITAMIN D 25 HYDROXY (VIT D DEFICIENCY, FRACTURES): Vit D, 25-Hydroxy: 38 ng/mL (ref 30–100)

## 2020-10-28 LAB — TSH: TSH: 1.52 mIU/L

## 2020-11-10 ENCOUNTER — Other Ambulatory Visit: Payer: Self-pay | Admitting: Obstetrics and Gynecology

## 2020-11-10 ENCOUNTER — Ambulatory Visit: Payer: BC Managed Care – PPO | Admitting: Obstetrics and Gynecology

## 2020-11-10 ENCOUNTER — Other Ambulatory Visit: Payer: Self-pay

## 2020-11-10 ENCOUNTER — Encounter: Payer: Self-pay | Admitting: Obstetrics and Gynecology

## 2020-11-10 ENCOUNTER — Ambulatory Visit (INDEPENDENT_AMBULATORY_CARE_PROVIDER_SITE_OTHER): Payer: BC Managed Care – PPO

## 2020-11-10 VITALS — BP 122/78 | HR 86 | Wt 156.0 lb

## 2020-11-10 DIAGNOSIS — N9489 Other specified conditions associated with female genital organs and menstrual cycle: Secondary | ICD-10-CM | POA: Diagnosis not present

## 2020-11-10 DIAGNOSIS — Z8742 Personal history of other diseases of the female genital tract: Secondary | ICD-10-CM

## 2020-11-10 DIAGNOSIS — Z9889 Other specified postprocedural states: Secondary | ICD-10-CM
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
## 2020-11-11 NOTE — Telephone Encounter (Signed)
Sent my chart message, encounter closed.

## 2020-12-15 ENCOUNTER — Ambulatory Visit (INDEPENDENT_AMBULATORY_CARE_PROVIDER_SITE_OTHER): Payer: BC Managed Care – PPO | Admitting: Psychiatry

## 2020-12-15 ENCOUNTER — Other Ambulatory Visit: Payer: Self-pay

## 2020-12-15 DIAGNOSIS — F4320 Adjustment disorder, unspecified: Secondary | ICD-10-CM | POA: Diagnosis not present

## 2020-12-15 DIAGNOSIS — M797 Fibromyalgia: Secondary | ICD-10-CM

## 2020-12-15 DIAGNOSIS — F3130 Bipolar disorder, current episode depressed, mild or moderate severity, unspecified: Secondary | ICD-10-CM | POA: Diagnosis not present

## 2020-12-15 DIAGNOSIS — Z634 Disappearance and death of family member: Secondary | ICD-10-CM

## 2020-12-15 DIAGNOSIS — F4010 Social phobia, unspecified: Secondary | ICD-10-CM | POA: Diagnosis not present

## 2020-12-15 DIAGNOSIS — F1210 Cannabis abuse, uncomplicated: Secondary | ICD-10-CM

## 2020-12-15 DIAGNOSIS — Z638 Other specified problems related to primary support group: Secondary | ICD-10-CM

## 2020-12-15 DIAGNOSIS — Z780 Asymptomatic menopausal state: Secondary | ICD-10-CM | POA: Diagnosis not present

## 2020-12-15 DIAGNOSIS — F1010 Alcohol abuse, uncomplicated: Secondary | ICD-10-CM

## 2020-12-15 DIAGNOSIS — Z91410 Personal history of adult physical and sexual abuse: Secondary | ICD-10-CM

## 2020-12-15 DIAGNOSIS — K589 Irritable bowel syndrome without diarrhea: Secondary | ICD-10-CM

## 2020-12-15 DIAGNOSIS — K2970 Gastritis, unspecified, without bleeding: Secondary | ICD-10-CM

## 2020-12-15 DIAGNOSIS — N3010 Interstitial cystitis (chronic) without hematuria: Secondary | ICD-10-CM

## 2020-12-15 DIAGNOSIS — Z62810 Personal history of physical and sexual abuse in childhood: Secondary | ICD-10-CM
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
## 2020-12-21 ENCOUNTER — Other Ambulatory Visit: Payer: Self-pay | Admitting: Obstetrics and Gynecology

## 2020-12-21 ENCOUNTER — Telehealth: Payer: Self-pay | Admitting: *Deleted

## 2020-12-21 DIAGNOSIS — N904 Leukoplakia of vulva: Secondary | ICD-10-CM

## 2020-12-21 MED ORDER — CLOBETASOL PROPIONATE 0.05 % EX OINT: TOPICAL_OINTMENT | CUTANEOUS | 0 refills | Status: DC

## 2020-12-21 NOTE — Telephone Encounter (Signed)
Patient called requesting refill on temovate ointment 0.05%, last filled in 04/2020. Annual exam scheduled on 05/19/2021. Please advise

## 2020-12-21 NOTE — Telephone Encounter (Signed)
Clobetasol refilled by me to pharmacy per patient request.

## 2020-12-21 NOTE — Progress Notes (Signed)
Prescription for Clobetasol ointment refilled per patient request.

## 2020-12-22 NOTE — Telephone Encounter (Signed)
Left detailed message on voicemail Rx sent to [facility].

## 2020-12-29 ENCOUNTER — Ambulatory Visit: Payer: Self-pay | Admitting: Psychiatry

## 2021-01-12 ENCOUNTER — Other Ambulatory Visit: Payer: Self-pay

## 2021-01-12 ENCOUNTER — Ambulatory Visit (INDEPENDENT_AMBULATORY_CARE_PROVIDER_SITE_OTHER): Payer: BC Managed Care – PPO | Admitting: Psychiatry

## 2021-01-12 DIAGNOSIS — F4010 Social phobia, unspecified: Secondary | ICD-10-CM

## 2021-01-12 DIAGNOSIS — Z92241 Personal history of systemic steroid therapy: Secondary | ICD-10-CM

## 2021-01-12 DIAGNOSIS — Z780 Asymptomatic menopausal state: Secondary | ICD-10-CM

## 2021-01-12 DIAGNOSIS — F5104 Psychophysiologic insomnia: Secondary | ICD-10-CM

## 2021-01-12 DIAGNOSIS — Z87898 Personal history of other specified conditions: Secondary | ICD-10-CM

## 2021-01-12 DIAGNOSIS — F411 Generalized anxiety disorder: Secondary | ICD-10-CM

## 2021-01-12 DIAGNOSIS — Z62810 Personal history of physical and sexual abuse in childhood: Secondary | ICD-10-CM

## 2021-01-12 DIAGNOSIS — F4522 Body dysmorphic disorder: Secondary | ICD-10-CM

## 2021-01-12 DIAGNOSIS — F1011 Alcohol abuse, in remission: Secondary | ICD-10-CM

## 2021-01-12 DIAGNOSIS — F3130 Bipolar disorder, current episode depressed, mild or moderate severity, unspecified: Secondary | ICD-10-CM | POA: Diagnosis not present

## 2021-01-12 DIAGNOSIS — Z91410 Personal history of adult physical and sexual abuse: Secondary | ICD-10-CM

## 2021-01-12 DIAGNOSIS — K589 Irritable bowel syndrome without diarrhea: Secondary | ICD-10-CM
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
## 2021-01-20 ENCOUNTER — Ambulatory Visit: Payer: BC Managed Care – PPO | Admitting: Nurse Practitioner

## 2021-01-26 ENCOUNTER — Other Ambulatory Visit: Payer: Self-pay

## 2021-01-26 ENCOUNTER — Ambulatory Visit: Payer: BC Managed Care – PPO | Admitting: Psychiatry

## 2021-01-26 ENCOUNTER — Telehealth: Payer: Self-pay | Admitting: Psychiatry

## 2021-01-26 NOTE — Progress Notes (Deleted)
Psychotherapy Progress Note [facility] [name], PhD LP  Patient ID: Erika Mccoy     MRN: [id number] Therapy format: {Therapy Types:21967::"Individual psychotherapy"} Date: 01/26/2021      Start: ***:***     Stop: ***:***     Time Spent: *** min Location: {SvcLoc:22530::"In-person"}   Session narrative (presenting needs, interim history, self-report of stressors and symptoms, applications of prior therapy, status changes, and interventions made in session) ***  Therapeutic modalities: {AM:23362::"Cognitive Behavioral Therapy","Solution-Oriented/Positive Psychology"}  Mental Status/Observations:  Appearance:   {PSY:22683}     Behavior:  {PSY:21022743}  Motor:  {PSY:22302}  Speech/Language:   {PSY:22685}  Affect:  {PSY:22687}  Mood:  {PSY:31886}  Thought process:  {PSY:31888}  Thought content:    {PSY:[phone]}  Sensory/Perceptual disturbances:    {PSY:[phone]}  Orientation:  {Psych Orientation:23301::"Fully oriented"}  Attention:  {Good-Fair-Poor ratings:23770::"Good"}    Concentration:  {Good-Fair-Poor ratings:23770::"Good"}  Memory:  {[phone]}  Insight:    {Good-Fair-Poor ratings:23770::"Good"}  Judgment:   {Good-Fair-Poor ratings:23770::"Good"}  Impulse Control:  {Good-Fair-Poor ratings:23770::"Good"}   Risk Assessment: Danger to Self: {Risk:22599::"No"} Self-injurious Behavior: {Risk:22599::"No"} Danger to Others: {Risk:22599::"No"} Physical Aggression / Violence: {Risk:22599::"No"} Duty to Warn: {AMYesNo:22526::"No"} Access to Firearms a concern: {AMYesNo:22526::"No"}  Assessment of progress:  {Progress:22147::"progressing"}  Diagnosis: No diagnosis found. Plan:  *** Other recommendations/advice as may be noted above Continue to utilize previously learned skills ad lib Maintain medication as prescribed and work faithfully with relevant prescriber(s) if any changes are desired or seem indicated Call the clinic  on-call service, 988/hotline, present to ER, or call 911 if any life-threatening psychiatric crisis No follow-ups on file. Already scheduled visit in this office 02/02/2021.  Robley Fries, PhD [name], PhD LP Clinical Psychologist, [facility] [address], [address] [place], [place] [address] [phone]

## 2021-01-26 NOTE — Telephone Encounter (Signed)
Admin note for non-service contact  Patient ID: Erika Mccoy  MRN: [id number] DATE: 01/26/2021  Inquiry this morning from PT, who has a bad sinus infection with coughing, tested negative for COVID, asking if preferred MyChart video rather than take the risk coming to the office.   Staff unable to return message before appt time, made TC personally, agreed to CA w/o penalty, resume next scheduled time.  Robley Fries, PhD [name], PhD LP Clinical Psychologist, [facility] [address] [place], [place] [address] [phone]

## 2021-02-02 ENCOUNTER — Other Ambulatory Visit: Payer: Self-pay

## 2021-02-02 ENCOUNTER — Ambulatory Visit (INDEPENDENT_AMBULATORY_CARE_PROVIDER_SITE_OTHER): Payer: BC Managed Care – PPO | Admitting: Psychiatry

## 2021-02-02 DIAGNOSIS — Z91410 Personal history of adult physical and sexual abuse: Secondary | ICD-10-CM

## 2021-02-02 DIAGNOSIS — Z630 Problems in relationship with spouse or partner: Secondary | ICD-10-CM

## 2021-02-02 DIAGNOSIS — F1011 Alcohol abuse, in remission: Secondary | ICD-10-CM

## 2021-02-02 DIAGNOSIS — Z87898 Personal history of other specified conditions: Secondary | ICD-10-CM

## 2021-02-02 DIAGNOSIS — F3130 Bipolar disorder, current episode depressed, mild or moderate severity, unspecified: Secondary | ICD-10-CM

## 2021-02-02 DIAGNOSIS — F1211 Cannabis abuse, in remission: Secondary | ICD-10-CM

## 2021-02-02 DIAGNOSIS — F5104 Psychophysiologic insomnia: Secondary | ICD-10-CM

## 2021-02-02 DIAGNOSIS — F411 Generalized anxiety disorder: Secondary | ICD-10-CM

## 2021-02-02 DIAGNOSIS — Z62810 Personal history of physical and sexual abuse in childhood: Secondary | ICD-10-CM

## 2021-02-02 DIAGNOSIS — Z92241 Personal history of systemic steroid therapy: Secondary | ICD-10-CM
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
## 2021-02-09 ENCOUNTER — Ambulatory Visit: Payer: Self-pay | Admitting: Psychiatry

## 2021-02-10 ENCOUNTER — Ambulatory Visit: Payer: Self-pay | Admitting: Physician Assistant

## 2021-02-16 ENCOUNTER — Ambulatory Visit (INDEPENDENT_AMBULATORY_CARE_PROVIDER_SITE_OTHER): Payer: BC Managed Care – PPO | Admitting: Psychiatry

## 2021-02-16 ENCOUNTER — Other Ambulatory Visit: Payer: Self-pay

## 2021-02-16 DIAGNOSIS — Z638 Other specified problems related to primary support group: Secondary | ICD-10-CM

## 2021-02-16 DIAGNOSIS — F4320 Adjustment disorder, unspecified: Secondary | ICD-10-CM | POA: Diagnosis not present

## 2021-02-16 DIAGNOSIS — M797 Fibromyalgia: Secondary | ICD-10-CM

## 2021-02-16 DIAGNOSIS — F411 Generalized anxiety disorder: Secondary | ICD-10-CM

## 2021-02-16 DIAGNOSIS — F4310 Post-traumatic stress disorder, unspecified: Secondary | ICD-10-CM | POA: Diagnosis not present

## 2021-02-16 DIAGNOSIS — N3010 Interstitial cystitis (chronic) without hematuria: Secondary | ICD-10-CM

## 2021-02-16 DIAGNOSIS — R41840 Attention and concentration deficit: Secondary | ICD-10-CM

## 2021-02-16 DIAGNOSIS — F3130 Bipolar disorder, current episode depressed, mild or moderate severity, unspecified: Secondary | ICD-10-CM

## 2021-02-16 DIAGNOSIS — Z634 Disappearance and death of family member: Secondary | ICD-10-CM

## 2021-02-16 NOTE — Progress Notes (Signed)
Psychotherapy Progress Note [facility] [name], PhD LP  Patient ID: Judeth Cornfield [place])    MRN: [id number] Therapy format: Individual psychotherapy Date: 02/16/2021      Start: 2:16p     Stop: 3:05p     Time Spent: 49 min Location: In-person   Session narrative (presenting needs, interim history, self-report of stressors and symptoms, applications of prior therapy, status changes, and interventions made in session) Got neglected dental cleaning done.  Getting ready to go to [facility] now to visit the twins for their 28th BD.  Transported herself today, since [name] couldn't, and it is maybe helping.  Ran across items of her mother's today, broke out in tears.  Knows she looked to mother for her protection, can feel unprotected now without her.   Better energy now -- says she had phone call with her psychiatrist, Shelly Coss, and got Prozac established, now 20mg , in addition to her Wellbutrin.  Gets up about 10:20a, after wearing off trazodone, 2 hydroxyzine (for bladder), prazosin (2... mg? auth 3).  2 Klonopin QHS.  Missed med eval with Ms. due to IC flareup, but may just continue now with Claybon Jabs, even if it is remote.  Drama in [name]'s family where his sister [name] took mother to atty and got herself made POA and executrix, changed the pass code to online banking, and now Westley Hummer is cordoned off for access.  [place] feeling more supportive as she sees [name] expected of but not compensated (e.g., 3 days of working on Judeth Cornfield transmission) and his siblings being gifted and compensated all the time.  Shaping up like an unfunded mandate to be his father's replacement.  Right now concerned it gets him up early in the morning for worrying and processing his anger.  Support/empathy provided, discussed self-care vs. Resentment and best care for [name].   Says the reason she doesn't drive more is attention -- she gets spacey, can't quite trust her awareness and  reflexes.  Interpreted hippocampal suppression owing to chronic, intense stressors and physiological disruptions, discussed how it can recover, and how Prozac, prazosin, circadian regulation, and stress management can help.    Therapeutic modalities: Cognitive Behavioral Therapy, Solution-Oriented/Positive Psychology, and Ego-Supportive  Mental Status/Observations:  Appearance:   Casual     Behavior:  Appropriate  Motor:  Normal  Speech/Language:   Clear and Coherent  Affect:  Appropriate  Mood:  sad and irritable with subject, responsive to TX  Thought process:  normal  Thought content:    WNL  Sensory/Perceptual disturbances:    WNL  Orientation:  Fully oriented  Attention:  Good, but complaints on her own    Concentration:  Fair  Memory:  grossly intact  Insight:    Good  Judgment:   Good  Impulse Control:  Fair   Risk Assessment: Danger to Self: No Self-injurious Behavior: No Danger to Others: No Physical Aggression / Violence: No Duty to Warn: No Access to Firearms a concern: No  Assessment of progress:  progressing  Diagnosis:   ICD-10-CM   1. Bipolar I disorder, most recent episode depressed (HCC)  F31.30     2. Bereavement reaction  F43.20    Z63.4     3. Generalized anxiety disorder  F41.1     4. PTSD (post-traumatic stress disorder)  F43.10     5. Interstitial cystitis  N30.10     6. Fibromyalgia  M79.7     7. Relationship problem with family member  Z37.8  8. Disturbed concentration  R41.840      Plan:  Endorse continuing SSRI + SDRI therapy Encourage alpha blocker titration as tolerated for PTSD and neuroprotection Encourage further stabilization of sleep and circadian rhythm Recommendations for support to [name], guarding herself against impotent fury at his family Other recommendations/advice as may be noted above Continue to utilize previously learned skills ad lib Maintain medication as prescribed and work faithfully with relevant  prescriber(s) if any changes are desired or seem indicated Call the clinic on-call service, 988/hotline, present to ER, or call 911 if any life-threatening psychiatric crisis Return for session(s) already scheduled. Already scheduled visit in this office 03/10/2021.  Robley Fries, PhD [name], PhD LP Clinical Psychologist, [facility] [address], [address] [place], [place] [address] [phone]

## 2021-03-03 ENCOUNTER — Telehealth: Payer: Self-pay | Admitting: *Deleted

## 2021-03-03 NOTE — Telephone Encounter (Signed)
Please have patient make an appointment to see me.  On her last ultrasound, there was the possibility of a cervical polyp and maybe an endometrial polyp, although the latter was less clear.

## 2021-03-03 NOTE — Telephone Encounter (Signed)
Patient complains of light vaginal bleeding.Bleeding began today. Noticed after bowel movement. Patient states she has hemorrhoids. No discharge, vaginal itching, vaginal odor. Patient states bleeding is only noticed if she wipes no need to wear a pad. I will route to Dr. Edward Jolly for recommendations.

## 2021-03-04 NOTE — Telephone Encounter (Signed)
Patient informed, transferred to appointments to schedule her for office visit.

## 2021-03-04 NOTE — Telephone Encounter (Signed)
Left message for pt to return our call. To let her know Dr. Rica Records recommendations of OV.
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
## 2021-03-05 ENCOUNTER — Ambulatory Visit: Payer: BC Managed Care – PPO | Admitting: Obstetrics and Gynecology

## 2021-03-05 ENCOUNTER — Other Ambulatory Visit: Payer: Self-pay

## 2021-03-05 ENCOUNTER — Encounter: Payer: Self-pay | Admitting: Obstetrics and Gynecology

## 2021-03-05 VITALS — BP 106/64 | HR 77 | Resp 16

## 2021-03-05 DIAGNOSIS — N924 Excessive bleeding in the premenopausal period: Secondary | ICD-10-CM | POA: Diagnosis not present

## 2021-03-05 DIAGNOSIS — Z79890 Hormone replacement therapy (postmenopausal): Secondary | ICD-10-CM

## 2021-03-05 DIAGNOSIS — Z634 Disappearance and death of family member: Secondary | ICD-10-CM

## 2021-03-05 DIAGNOSIS — N951 Menopausal and female climacteric states: Secondary | ICD-10-CM

## 2021-03-05 MED ORDER — PROGESTERONE MICRONIZED 100 MG PO CAPS: ORAL_CAPSULE | ORAL | 0 refills | Status: DC

## 2021-03-10 ENCOUNTER — Ambulatory Visit: Payer: BC Managed Care – PPO | Admitting: Psychiatry

## 2021-03-10 ENCOUNTER — Other Ambulatory Visit: Payer: Self-pay | Admitting: Obstetrics and Gynecology

## 2021-03-10 ENCOUNTER — Other Ambulatory Visit: Payer: Self-pay

## 2021-03-10 DIAGNOSIS — F411 Generalized anxiety disorder: Secondary | ICD-10-CM | POA: Diagnosis not present

## 2021-03-10 DIAGNOSIS — Z59869 Financial insecurity, unspecified: Secondary | ICD-10-CM

## 2021-03-10 DIAGNOSIS — F4320 Adjustment disorder, unspecified: Secondary | ICD-10-CM

## 2021-03-10 DIAGNOSIS — N924 Excessive bleeding in the premenopausal period: Secondary | ICD-10-CM

## 2021-03-10 DIAGNOSIS — Z5986 Financial insecurity: Secondary | ICD-10-CM

## 2021-03-10 DIAGNOSIS — F5104 Psychophysiologic insomnia: Secondary | ICD-10-CM

## 2021-03-10 DIAGNOSIS — F3130 Bipolar disorder, current episode depressed, mild or moderate severity, unspecified: Secondary | ICD-10-CM

## 2021-03-10 DIAGNOSIS — Z638 Other specified problems related to primary support group: Secondary | ICD-10-CM

## 2021-03-10 DIAGNOSIS — F4310 Post-traumatic stress disorder, unspecified: Secondary | ICD-10-CM | POA: Diagnosis not present

## 2021-03-10 DIAGNOSIS — N3010 Interstitial cystitis (chronic) without hematuria: Secondary | ICD-10-CM

## 2021-03-10 DIAGNOSIS — M797 Fibromyalgia: Secondary | ICD-10-CM

## 2021-03-10 DIAGNOSIS — Z634 Disappearance and death of family member: Secondary | ICD-10-CM
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
## 2021-03-24 ENCOUNTER — Ambulatory Visit: Payer: BC Managed Care – PPO | Admitting: Psychiatry

## 2021-04-19 ENCOUNTER — Emergency Department (HOSPITAL_COMMUNITY): Payer: BC Managed Care – PPO

## 2021-04-19 ENCOUNTER — Emergency Department (HOSPITAL_COMMUNITY)
Admission: EM | Admit: 2021-04-19 | Discharge: 2021-04-20 | Disposition: A | Discharge status: Home / Self Care | Payer: BC Managed Care – PPO | Attending: Emergency Medicine | Admitting: Emergency Medicine

## 2021-04-19 ENCOUNTER — Other Ambulatory Visit: Payer: Self-pay

## 2021-04-19 DIAGNOSIS — J45909 Unspecified asthma, uncomplicated: Secondary | ICD-10-CM | POA: Insufficient documentation

## 2021-04-19 DIAGNOSIS — Z79899 Other long term (current) drug therapy: Secondary | ICD-10-CM | POA: Insufficient documentation

## 2021-04-19 DIAGNOSIS — R072 Precordial pain: Secondary | ICD-10-CM

## 2021-04-19 DIAGNOSIS — R0602 Shortness of breath: Secondary | ICD-10-CM

## 2021-04-19 DIAGNOSIS — R002 Palpitations: Secondary | ICD-10-CM

## 2021-04-19 LAB — CBC
HCT: 40 % (ref 36.0–46.0)
Hemoglobin: 13.4 g/dL (ref 12.0–15.0)
MCH: 30.2 pg (ref 26.0–34.0)
MCHC: 33.5 g/dL (ref 30.0–36.0)
MCV: 90.1 fL (ref 80.0–100.0)
Platelets: 234 10*3/uL (ref 150–400)
RBC: 4.44 MIL/uL (ref 3.87–5.11)
RDW: 11.9 % (ref 11.5–15.5)
WBC: 8.3 10*3/uL (ref 4.0–10.5)
nRBC: 0 % (ref 0.0–0.2)

## 2021-04-19 LAB — BASIC METABOLIC PANEL
Anion gap: 12 (ref 5–15)
BUN: 18 mg/dL (ref 6–20)
CO2: 25 mmol/L (ref 22–32)
Calcium: 9.8 mg/dL (ref 8.9–10.3)
Chloride: 98 mmol/L (ref 98–111)
Creatinine, Ser: 0.88 mg/dL (ref 0.44–1.00)
GFR, Estimated: >60 mL/min (ref >60)
Glucose, Bld: 106 mg/dL — ABNORMAL HIGH (ref 70–99)
Potassium: 3.4 mmol/L — ABNORMAL LOW (ref 3.5–5.1)
Sodium: 135 mmol/L (ref 135–145)

## 2021-04-19 LAB — BRAIN NATRIURETIC PEPTIDE: B Natriuretic Peptide: 5.2 pg/mL (ref 0.0–100.0)

## 2021-04-19 LAB — TROPONIN I (HIGH SENSITIVITY)
Troponin I (High Sensitivity): 3 ng/L (ref <18)
Troponin I (High Sensitivity): 3 ng/L (ref <18)

## 2021-04-19 NOTE — ED Triage Notes (Signed)
Pt reports L to middle chest pain with associated jaw pain x 2 weeks. Also reports palpitations/like her heart is skipping a beat. Has noticed herself becoming sob with exertion that shouldn't precipitate sob.

## 2021-04-19 NOTE — ED Provider Notes (Signed)
Emergency Medicine Provider Triage Evaluation Note  Briasia Flinders , a 55 y.o. female  was evaluated in triage.  Pt complains of chest discomfort, palpitations and dyspnea on exertion for 2 weeks.  Review of Systems  Positive: chest discomfort, palpitations and dyspnea on exertion for 2 weeks. Negative: fever  Physical Exam  BP (!) 149/97 (BP Location: Right Arm)   Pulse (!) 104   Temp 98.7 F (37.1 C) (Oral)   Resp 18   SpO2 99%  Gen:   Awake, no distress   Resp:  Normal effort  MSK:   Moves extremities without difficulty  Other:  Heart with rrr, lungs ctab  Medical Decision Making  Medically screening exam initiated at 7:00 PM.  Appropriate orders placed.  Yarlin Stogner-Comer was informed that the remainder of the evaluation will be completed by another provider, this initial triage assessment does not replace that evaluation, and the importance of remaining in the ED until their evaluation is complete.     Rayne Du 04/19/21 1901    Jacalyn Lefevre, MD 04/19/21 1924

## 2021-04-20 ENCOUNTER — Other Ambulatory Visit: Payer: Self-pay

## 2021-04-20 LAB — D-DIMER, QUANTITATIVE: D-Dimer, Quant: <0.27 ug/mL-FEU (ref 0.00–0.50)
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
## 2021-04-21 ENCOUNTER — Other Ambulatory Visit: Payer: Self-pay

## 2021-04-21 DIAGNOSIS — R102 Pelvic and perineal pain: Secondary | ICD-10-CM

## 2021-04-21 NOTE — Telephone Encounter (Signed)
Last AEX 05/11/20 AEX Scheduled 05/19/21  Patient states it was prescribed for unusual vulvar burning.

## 2021-04-22 MED ORDER — NONFORMULARY OR COMPOUNDED ITEM: 0 refills | Status: DC

## 2021-04-28 ENCOUNTER — Ambulatory Visit (INDEPENDENT_AMBULATORY_CARE_PROVIDER_SITE_OTHER): Payer: BC Managed Care – PPO | Admitting: Internal Medicine

## 2021-04-28 ENCOUNTER — Encounter: Payer: Self-pay | Admitting: Internal Medicine

## 2021-04-28 VITALS — BP 120/80 | HR 80 | Temp 98.1°F | Ht 66.0 in | Wt 164.3 lb

## 2021-04-28 DIAGNOSIS — N3010 Interstitial cystitis (chronic) without hematuria: Secondary | ICD-10-CM | POA: Diagnosis not present

## 2021-04-28 DIAGNOSIS — K219 Gastro-esophageal reflux disease without esophagitis: Secondary | ICD-10-CM | POA: Diagnosis not present

## 2021-04-28 DIAGNOSIS — Z0000 Encounter for general adult medical examination without abnormal findings: Secondary | ICD-10-CM | POA: Diagnosis not present

## 2021-04-28 DIAGNOSIS — F449 Dissociative and conversion disorder, unspecified: Secondary | ICD-10-CM

## 2021-04-28 DIAGNOSIS — F319 Bipolar disorder, unspecified: Secondary | ICD-10-CM

## 2021-04-28 DIAGNOSIS — J4531 Mild persistent asthma with (acute) exacerbation: Secondary | ICD-10-CM | POA: Diagnosis not present

## 2021-04-28 DIAGNOSIS — F411 Generalized anxiety disorder: Secondary | ICD-10-CM

## 2021-04-28 DIAGNOSIS — M797 Fibromyalgia: Secondary | ICD-10-CM

## 2021-04-28 NOTE — Telephone Encounter (Signed)
Rx phoned in to pharmacy. Patient informed. 

## 2021-04-28 NOTE — Telephone Encounter (Signed)
Rx phone
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
## 2021-04-28 NOTE — Patient Instructions (Signed)
-  Nice seeing you today!!  -Lab work today; will notify you once results are available.  -Referral to rheumatology has been placed.  -Schedule follow up in 1 year or sooner as needed.

## 2021-04-29 LAB — COMPREHENSIVE METABOLIC PANEL
ALT: 11 U/L (ref 0–35)
AST: 17 U/L (ref 0–37)
Albumin: 4.3 g/dL (ref 3.5–5.2)
Alkaline Phosphatase: 72 U/L (ref 39–117)
BUN: 12 mg/dL (ref 6–23)
CO2: 30 mEq/L (ref 19–32)
Calcium: 9.7 mg/dL (ref 8.4–10.5)
Chloride: 103 mEq/L (ref 96–112)
Creatinine, Ser: 0.92 mg/dL (ref 0.40–1.20)
GFR: 70.5 mL/min (ref >60.00)
Glucose, Bld: 89 mg/dL (ref 70–99)
Potassium: 4.5 mEq/L (ref 3.5–5.1)
Sodium: 142 mEq/L (ref 135–145)
Total Bilirubin: 0.3 mg/dL (ref 0.2–1.2)
Total Protein: 7.3 g/dL (ref 6.0–8.3)

## 2021-04-29 LAB — CBC WITH DIFFERENTIAL/PLATELET
Basophils Absolute: 0.1 10*3/uL (ref 0.0–0.1)
Basophils Relative: 1 % (ref 0.0–3.0)
Eosinophils Absolute: 0.1 10*3/uL (ref 0.0–0.7)
Eosinophils Relative: 1.7 % (ref 0.0–5.0)
HCT: 41.1 % (ref 36.0–46.0)
Hemoglobin: 13.7 g/dL (ref 12.0–15.0)
Lymphocytes Relative: 31 % (ref 12.0–46.0)
Lymphs Abs: 1.8 10*3/uL (ref 0.7–4.0)
MCHC: 33.4 g/dL (ref 30.0–36.0)
MCV: 90.4 fl (ref 78.0–100.0)
Monocytes Absolute: 0.4 10*3/uL (ref 0.1–1.0)
Monocytes Relative: 7.1 % (ref 3.0–12.0)
Neutro Abs: 3.5 10*3/uL (ref 1.4–7.7)
Neutrophils Relative %: 59.2 % (ref 43.0–77.0)
Platelets: 242 10*3/uL (ref 150.0–400.0)
RBC: 4.55 Mil/uL (ref 3.87–5.11)
RDW: 12.8 % (ref 11.5–15.5)
WBC: 5.8 10*3/uL (ref 4.0–10.5)

## 2021-04-29 LAB — TSH: TSH: 2.4 u[IU]/mL (ref 0.35–5.50)

## 2021-04-29 LAB — VITAMIN B12: Vitamin B-12: 926 pg/mL — ABNORMAL HIGH (ref 211–911)

## 2021-04-29 LAB — LIPID PANEL
Cholesterol: 234 mg/dL — ABNORMAL HIGH (ref 0–200)
HDL: 55 mg/dL (ref >39.00)
NonHDL: 178.6
Total CHOL/HDL Ratio: 4
Triglycerides: 354 mg/dL — ABNORMAL HIGH (ref 0.0–149.0)
VLDL: 70.8 mg/dL — ABNORMAL HIGH (ref 0.0–40.0)

## 2021-04-29 LAB — LDL CHOLESTEROL, DIRECT: Direct LDL: 136 mg/dL

## 2021-04-29 LAB — HEMOGLOBIN A1C: Hgb A1c MFr Bld: 5.5 % (ref 4.6–6.5)

## 2021-04-29 LAB — VITAMIN D 25 HYDROXY (VIT D DEFICIENCY, FRACTURES): VITD: 41.45 ng/mL (ref 30.00–100.00)

## 2021-05-05 ENCOUNTER — Other Ambulatory Visit: Payer: Self-pay | Admitting: Internal Medicine

## 2021-05-05 DIAGNOSIS — E782 Mixed hyperlipidemia: Secondary | ICD-10-CM

## 2021-05-05 MED ORDER — ATORVASTATIN CALCIUM 20 MG PO TABS: 20.0000 mg | ORAL_TABLET | Freq: Every day | ORAL | 1 refills | Status: DC

## 2021-05-18 ENCOUNTER — Other Ambulatory Visit: Payer: Self-pay

## 2021-05-18 DIAGNOSIS — N951 Menopausal and female climacteric states: Secondary | ICD-10-CM

## 2021-05-18 NOTE — Telephone Encounter (Signed)
AEX scheduled tomorrow 05/19/21. Mammo UTD.

## 2021-05-19 ENCOUNTER — Ambulatory Visit (INDEPENDENT_AMBULATORY_CARE_PROVIDER_SITE_OTHER): Payer: BC Managed Care – PPO | Admitting: Obstetrics and Gynecology

## 2021-05-19 ENCOUNTER — Other Ambulatory Visit: Payer: Self-pay

## 2021-05-19 ENCOUNTER — Encounter: Payer: Self-pay | Admitting: Obstetrics and Gynecology

## 2021-05-19 DIAGNOSIS — N924 Excessive bleeding in the premenopausal period: Secondary | ICD-10-CM

## 2021-05-19 DIAGNOSIS — N951 Menopausal and female climacteric states: Secondary | ICD-10-CM | POA: Diagnosis not present

## 2021-05-19 DIAGNOSIS — N904 Leukoplakia of vulva: Secondary | ICD-10-CM

## 2021-05-19 DIAGNOSIS — Z01419 Encounter for gynecological examination (general) (routine) without abnormal findings: Secondary | ICD-10-CM | POA: Diagnosis not present

## 2021-05-19 MED ORDER — ESTRADIOL 0.05 MG/24HR TD PTTW: 1.0000 | MEDICATED_PATCH | TRANSDERMAL | 3 refills | Status: DC

## 2021-05-19 MED ORDER — PROGESTERONE 200 MG PO CAPS: ORAL_CAPSULE | ORAL | 3 refills | Status: DC

## 2021-05-19 NOTE — Patient Instructions (Signed)
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
## 2021-05-20 NOTE — Telephone Encounter (Signed)
Rx sent 05/19/21,1/523.

## 2021-05-24 ENCOUNTER — Other Ambulatory Visit: Payer: Self-pay

## 2021-05-24 ENCOUNTER — Encounter: Payer: Self-pay | Admitting: Cardiovascular Disease

## 2021-05-24 ENCOUNTER — Ambulatory Visit: Payer: BC Managed Care – PPO | Admitting: Cardiovascular Disease

## 2021-05-24 ENCOUNTER — Other Ambulatory Visit (HOSPITAL_COMMUNITY): Payer: Self-pay

## 2021-05-24 VITALS — BP 82/70 | HR 72 | Ht 64.5 in | Wt 161.0 lb

## 2021-05-24 DIAGNOSIS — R072 Precordial pain: Secondary | ICD-10-CM | POA: Diagnosis not present

## 2021-05-24 DIAGNOSIS — R079 Chest pain, unspecified: Secondary | ICD-10-CM | POA: Diagnosis not present

## 2021-05-24 DIAGNOSIS — Z01812 Encounter for preprocedural laboratory examination: Secondary | ICD-10-CM

## 2021-05-24 MED ORDER — IVABRADINE HCL 7.5 MG PO TABS
15.0000 mg | ORAL_TABLET | Freq: Once | ORAL | 0 refills | Status: AC
  Filled 2021-05-24 – 2021-06-01 (×2): qty 2, 1d supply, fill #0

## 2021-05-24 NOTE — Patient Instructions (Addendum)
Medication Instructions:  No changes *If you need a refill on your cardiac medications before your next appointment, please call your pharmacy*   Lab Work: Today: BMET   Testing/Procedures: Your physician has requested that you have an echocardiogram. Echocardiography is a painless test that uses sound waves to create images of your heart. It provides your doctor with information about the size and shape of your heart and how well your hearts chambers and valves are working. This procedure takes approximately one hour. There are no restrictions for this procedure.  Cardiac CTA - see instructions below.   Follow-Up: As needed based on test results.   Other Instructions     Your cardiac CT will be scheduled at  [facility] [address] [place], [place] [address] [phone]   Please arrive at the [facility] main entrance (entrance A) of [facility] 30 minutes prior to test start time. You can use the FREE valet parking offered at the main entrance (encouraged to control the heart rate for the test) Proceed to the [facility] Radiology Department (first floor) to check-in and test prep.  Please follow these instructions carefully (unless otherwise directed):   On the Night Before the Test: Be sure to Drink plenty of water. Do not consume any caffeinated/decaffeinated beverages or chocolate 12 hours prior to your test. Do not take any antihistamines 12 hours prior to your test.  On the Day of the Test: Drink plenty of water until 1 hour prior to the test. Do not eat any food 4 hours prior to the test. You may take your regular medications prior to the test.  Take ivabradine (Corlanor) 15 mg two hours prior to test. FEMALES- please wear underwire-free bra if available, avoid dresses & tight clothing       After the Test: Drink plenty of water. After receiving IV contrast, you may experience a mild flushed feeling. This is normal. On occasion,  you may experience a mild rash up to 24 hours after the test. This is not dangerous. If this occurs, you can take Benadryl 25 mg and increase your fluid intake. If you experience trouble breathing, this can be serious. If it is severe call 911 IMMEDIATELY. If it is mild, please call our office. If you take any of these medications: Glipizide/Metformin, Avandament, Glucavance, please do not take 48 hours after completing test unless otherwise instructed.  Please allow 2-4 weeks for scheduling of routine cardiac CTs. Some insurance companies require a pre-authorization which may delay scheduling of this test.   For non-scheduling related questions, please contact the cardiac imaging nurse navigator should you have any questions/concerns: Rockwell Alexandria, Cardiac Imaging Nurse Navigator Larey Brick, Cardiac Imaging Nurse Navigator [facility] Direct Office Dial: [phone]   For scheduling needs, including cancellations and rescheduling, please call [place], [phone]

## 2021-05-24 NOTE — Addendum Note (Signed)
Addended by: Lendon Ka on: 05/24/2021 02:29 PM   Modules accepted: Orders
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
## 2021-05-25 LAB — BASIC METABOLIC PANEL
BUN/Creatinine Ratio: 18 (ref 9–23)
BUN: 17 mg/dL (ref 6–24)
CO2: 26 mmol/L (ref 20–29)
Calcium: 9.8 mg/dL (ref 8.7–10.2)
Chloride: 99 mmol/L (ref 96–106)
Creatinine, Ser: 0.96 mg/dL (ref 0.57–1.00)
Glucose: 89 mg/dL (ref 70–99)
Potassium: 4.5 mmol/L (ref 3.5–5.2)
Sodium: 138 mmol/L (ref 134–144)
eGFR: 70 mL/min/{1.73_m2} (ref >59)

## 2021-06-01 ENCOUNTER — Other Ambulatory Visit (HOSPITAL_COMMUNITY): Payer: Self-pay

## 2021-06-02 ENCOUNTER — Telehealth (HOSPITAL_COMMUNITY): Payer: Self-pay | Admitting: *Deleted

## 2021-06-02 NOTE — Telephone Encounter (Signed)
Reaching out to patient to offer assistance regarding upcoming cardiac imaging study; pt verbalizes understanding of appt date/time, parking situation and where to check in, pre-test NPO status and medications ordered, and verified current allergies; name and call back number provided for further questions should they arise  Erika Brick RN Navigator Cardiac Imaging [facility] [phone] office [phone] cell  Patient to take 15mg  ivabradine two hours prior to cardiac CT scan. She is aware she needs a ride if she take her klonopin and to arrive at 11:30am for her noon scan.

## 2021-06-03 ENCOUNTER — Ambulatory Visit (HOSPITAL_COMMUNITY)
Admission: RE | Admit: 2021-06-03 | Discharge: 2021-06-03 | Disposition: A | Discharge status: Home / Self Care | Payer: BC Managed Care – PPO | Source: Ambulatory Visit | Attending: Cardiovascular Disease | Admitting: Cardiovascular Disease

## 2021-06-03 ENCOUNTER — Other Ambulatory Visit: Payer: Self-pay

## 2021-06-03 DIAGNOSIS — R072 Precordial pain: Secondary | ICD-10-CM

## 2021-06-03 MED ORDER — IOHEXOL 350 MG/ML SOLN
100.0000 mL | Freq: Once | INTRAVENOUS | Status: AC | PRN
  Administered 2021-06-03: 100 mL via INTRAVENOUS

## 2021-06-03 MED ORDER — NITROGLYCERIN 0.4 MG SL SUBL
SUBLINGUAL_TABLET | SUBLINGUAL | Status: AC
  Filled 2021-06-03: qty 2

## 2021-06-03 MED ORDER — NITROGLYCERIN 0.4 MG SL SUBL
0.8000 mg | SUBLINGUAL_TABLET | Freq: Once | SUBLINGUAL | Status: AC
  Administered 2021-06-03: 0.8 mg via SUBLINGUAL

## 2021-06-04 ENCOUNTER — Telehealth: Payer: Self-pay

## 2021-06-04 DIAGNOSIS — E781 Pure hyperglyceridemia: Secondary | ICD-10-CM

## 2021-06-04 NOTE — Telephone Encounter (Signed)
The patient has been notified of the result and verbalized understanding.  All questions (if any) were answered. Theresia Majors, RN 06/04/2021 1:57 PM   Patient states that she has gastritis and is unable to take ASA. She also reports that she does not tolerate Lipitor. She reports multiple auto-immune disorders that cause flare-ups and she can't tolerate the side effects of Lipitor on top of her ongoing conditions. She states that it has caused worsening leg weakness, aches and pains. She also reports an upset stomach and diarrhea. She is interested in other medications for her cholesterol. Advised that I would send a message to Dr. Clifton James for further recommendations.

## 2021-06-04 NOTE — Telephone Encounter (Signed)
--  Caller state that she was just started Lipitor and since then she is more depressed, having more anxiety and having "bad thoughts". She is waking up feeling like she is going to vomit every morning. She is tired of "everything going wrong" Everything is acting up and she is in a lot of pain. They want me to walk. She has been having thoughts of hurting self. They are dark and terrible. She recently spoke to her psychiatrist. She has an appointment with her therapist.  06/03/2021 4:52:57 PM See PCP within 24 Hours Dante Gang, RN, Dossie Arbour  Comments User: Valeta Harms, RN Date/Time Lamount Cohen Time): 06/03/2021 4:47:00 PM Caller states "It would just be easier if I was not here." My family is all moving away. Her mother recently.  Referrals REFERRED TO PCP OFFICE  06/04/21 1644: Pt states she feeling better today. She talked to her psychiatrist today & is back on mood stabilizer medication. Pt states she is a good advocate for herself & recognized that she needed help. Pt states she also talked to cardiologist & is going to go to the lipid clinic. She felt like the Lipitor made her depressed & then she had a trigger. Since she has a plan she feels better.

## 2021-06-04 NOTE — Telephone Encounter (Signed)
Patient has been referred to see PharmD in the lipid clinic. Appointment is 1/25 at 10:30am. Patient is aware.

## 2021-06-04 NOTE — Telephone Encounter (Signed)
-----   Message from Kathleene Hazel, MD sent at 06/03/2021  7:47 PM EST ----- She has very mild CAD. I would have her continue the statin and start ASA 81 mg daily. Thayer Ohm

## 2021-06-07 ENCOUNTER — Ambulatory Visit: Payer: BC Managed Care – PPO | Admitting: Psychiatry

## 2021-06-08 ENCOUNTER — Ambulatory Visit (HOSPITAL_COMMUNITY): Payer: BC Managed Care – PPO | Attending: Cardiology

## 2021-06-08 ENCOUNTER — Other Ambulatory Visit: Payer: Self-pay

## 2021-06-08 DIAGNOSIS — R079 Chest pain, unspecified: Secondary | ICD-10-CM
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
## 2021-06-09 ENCOUNTER — Ambulatory Visit: Payer: BC Managed Care – PPO | Admitting: Pharmacist

## 2021-06-09 DIAGNOSIS — E781 Pure hyperglyceridemia: Secondary | ICD-10-CM | POA: Diagnosis not present

## 2021-06-09 DIAGNOSIS — E782 Mixed hyperlipidemia: Secondary | ICD-10-CM | POA: Diagnosis not present

## 2021-06-09 MED ORDER — ROSUVASTATIN CALCIUM 20 MG PO TABS: 20.0000 mg | ORAL_TABLET | Freq: Every day | ORAL | 11 refills | Status: DC

## 2021-06-09 NOTE — Patient Instructions (Addendum)
It was nice to meet you today!  Your LDL is 136 and your goal is <70 mg/dL.  Your triglyceride is 354 and your goal is <150 mg/dL.  Stop taking atorvastatin and start taking rosuvastatin 20 mg daily.  We will have you come back for lipid labs on March 13th, 2023. You can come in anytime after 7:30 AM, fasting.
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
## 2021-06-16 ENCOUNTER — Ambulatory Visit: Payer: BC Managed Care – PPO | Admitting: Internal Medicine

## 2021-06-16 ENCOUNTER — Other Ambulatory Visit: Payer: Self-pay

## 2021-06-16 ENCOUNTER — Encounter: Payer: Self-pay | Admitting: Internal Medicine

## 2021-06-16 VITALS — BP 104/75 | HR 91 | Resp 12 | Ht 65.0 in | Wt 153.9 lb

## 2021-06-16 DIAGNOSIS — G9332 Myalgic encephalomyelitis/chronic fatigue syndrome: Secondary | ICD-10-CM

## 2021-06-16 DIAGNOSIS — G43709 Chronic migraine without aura, not intractable, without status migrainosus: Secondary | ICD-10-CM

## 2021-06-16 DIAGNOSIS — M797 Fibromyalgia: Secondary | ICD-10-CM

## 2021-06-16 DIAGNOSIS — N3010 Interstitial cystitis (chronic) without hematuria: Secondary | ICD-10-CM | POA: Diagnosis not present

## 2021-06-16 DIAGNOSIS — M75102 Unspecified rotator cuff tear or rupture of left shoulder, not specified as traumatic: Secondary | ICD-10-CM | POA: Diagnosis not present

## 2021-06-16 DIAGNOSIS — K589 Irritable bowel syndrome without diarrhea: Secondary | ICD-10-CM

## 2021-06-16 NOTE — Patient Instructions (Addendum)
Fibromyalgia is not a progressive or deforming type of disease but frustration with longstanding symptoms is very understandable. I do think trying medication such as SNRI drugs (Lyrica, Cymbalta, Savella) would be appropriate to try for your ongoing symptoms. Treatment is usually a combination of medications and a number of lifestyle steps many of which you are doing a great job on already.  I recommend checking out the [facility] [place] patient-centered guide for fibromyalgia and chronic pain management: https://[facility]ell-gard[facility].net/

## 2021-06-24 ENCOUNTER — Other Ambulatory Visit: Payer: Self-pay | Admitting: Obstetrics and Gynecology

## 2021-06-24 DIAGNOSIS — N904 Leukoplakia of vulva: Secondary | ICD-10-CM

## 2021-06-25 NOTE — Telephone Encounter (Signed)
AEX 05/19/21 

## 2021-06-28 ENCOUNTER — Telehealth: Payer: Self-pay | Admitting: Internal Medicine

## 2021-06-28 ENCOUNTER — Encounter: Payer: Self-pay | Admitting: Internal Medicine

## 2021-06-28 MED ORDER — PREGABALIN 75 MG PO CAPS: 75.0000 mg | ORAL_CAPSULE | Freq: Two times a day (BID) | ORAL | 0 refills | Status: DC

## 2021-06-28 NOTE — Telephone Encounter (Signed)
Was just addressed in other message from today, medication was Lyrica and Rx is now sent.

## 2021-06-28 NOTE — Telephone Encounter (Signed)
Sent Rx now for Lyrica 75 mg BID. She is on other medicines that can contribute to intolerance, so this is a lower starting dose not a maximum dose.

## 2021-06-28 NOTE — Telephone Encounter (Signed)
I called patient 

## 2021-06-28 NOTE — Addendum Note (Signed)
Addended by: Fuller Plan on: 06/28/2021 12:53 PM   Modules accepted: Orders

## 2021-06-28 NOTE — Telephone Encounter (Signed)
Patient left a voicemail stating at her new patient appointment Dr. Benjamine Mola talked with her about starting a new medication but did not call anything in or give her a prescription. Patient states she would like to start the medication as discussed. Patient did not say what medication.

## 2021-07-02 ENCOUNTER — Telehealth: Payer: Self-pay | Admitting: *Deleted

## 2021-07-02 DIAGNOSIS — N924 Excessive bleeding in the premenopausal period: Secondary | ICD-10-CM

## 2021-07-02 MED ORDER — PROGESTERONE 200 MG PO CAPS: 200.0000 mg | ORAL_CAPSULE | Freq: Every day | ORAL | 3 refills | Status: DC

## 2021-07-02 NOTE — Telephone Encounter (Signed)
Erika Mccoy called and had question about the progesterone 200 mg tablet Rx sent on 05/19/21. He asked should patient take 100 mg or 200 mg. Current directions on Rx are"TAKE 2 CAPSULE(100 MG) BY MOUTH DAILY" However in office note Dr.Silva said "Prometrium 200 mg daily "  New Rx sent with directions per note

## 2021-07-04 NOTE — Telephone Encounter (Signed)
That is correct, her daily dosage is Prometrium 200 mg.   Encounter reviewed and closed.

## 2021-07-06 ENCOUNTER — Ambulatory Visit: Payer: BC Managed Care – PPO | Admitting: Psychiatry

## 2021-07-07 ENCOUNTER — Ambulatory Visit (INDEPENDENT_AMBULATORY_CARE_PROVIDER_SITE_OTHER): Payer: BC Managed Care – PPO | Admitting: Internal Medicine

## 2021-07-07 ENCOUNTER — Other Ambulatory Visit: Payer: Self-pay

## 2021-07-07 ENCOUNTER — Encounter: Payer: Self-pay | Admitting: Internal Medicine

## 2021-07-07 VITALS — BP 107/73 | HR 85 | Resp 15 | Ht 64.75 in | Wt 158.0 lb

## 2021-07-07 DIAGNOSIS — M797 Fibromyalgia: Secondary | ICD-10-CM
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
## 2021-07-22 ENCOUNTER — Encounter: Payer: Self-pay | Admitting: Family Medicine

## 2021-07-22 ENCOUNTER — Encounter: Payer: Self-pay | Admitting: Internal Medicine

## 2021-07-22 ENCOUNTER — Encounter (HOSPITAL_BASED_OUTPATIENT_CLINIC_OR_DEPARTMENT_OTHER): Payer: Self-pay

## 2021-07-22 ENCOUNTER — Other Ambulatory Visit: Payer: Self-pay

## 2021-07-22 ENCOUNTER — Emergency Department (HOSPITAL_BASED_OUTPATIENT_CLINIC_OR_DEPARTMENT_OTHER): Payer: BC Managed Care – PPO

## 2021-07-22 ENCOUNTER — Emergency Department (HOSPITAL_BASED_OUTPATIENT_CLINIC_OR_DEPARTMENT_OTHER)
Admission: EM | Admit: 2021-07-22 | Discharge: 2021-07-22 | Disposition: A | Discharge status: Home / Self Care | Payer: BC Managed Care – PPO | Attending: Emergency Medicine | Admitting: Emergency Medicine

## 2021-07-22 ENCOUNTER — Telehealth (INDEPENDENT_AMBULATORY_CARE_PROVIDER_SITE_OTHER): Payer: BC Managed Care – PPO | Admitting: Family Medicine

## 2021-07-22 VITALS — Temp 97.2°F | Wt 150.0 lb

## 2021-07-22 DIAGNOSIS — Z8616 Personal history of COVID-19: Secondary | ICD-10-CM | POA: Insufficient documentation

## 2021-07-22 DIAGNOSIS — R059 Cough, unspecified: Secondary | ICD-10-CM | POA: Diagnosis not present

## 2021-07-22 DIAGNOSIS — S32009A Unspecified fracture of unspecified lumbar vertebra, initial encounter for closed fracture: Secondary | ICD-10-CM | POA: Insufficient documentation

## 2021-07-22 DIAGNOSIS — M545 Low back pain, unspecified: Secondary | ICD-10-CM

## 2021-07-22 DIAGNOSIS — S34109A Unspecified injury to unspecified level of lumbar spinal cord, initial encounter: Secondary | ICD-10-CM | POA: Diagnosis present

## 2021-07-22 DIAGNOSIS — U071 COVID-19: Secondary | ICD-10-CM | POA: Diagnosis not present

## 2021-07-22 DIAGNOSIS — W01198A Fall on same level from slipping, tripping and stumbling with subsequent striking against other object, initial encounter: Secondary | ICD-10-CM | POA: Diagnosis not present

## 2021-07-22 DIAGNOSIS — R0981 Nasal congestion: Secondary | ICD-10-CM | POA: Diagnosis not present

## 2021-07-22 DIAGNOSIS — W19XXXA Unspecified fall, initial encounter: Secondary | ICD-10-CM

## 2021-07-22 MED ORDER — OXYCODONE-ACETAMINOPHEN 5-325 MG PO TABS
1.0000 | ORAL_TABLET | Freq: Once | ORAL | Status: AC
  Administered 2021-07-22: 20:00:00 1 via ORAL
  Filled 2021-07-22: qty 1

## 2021-07-22 MED ORDER — ACETAMINOPHEN 500 MG PO TABS
1000.0000 mg | ORAL_TABLET | Freq: Once | ORAL | Status: AC
  Administered 2021-07-22: 18:00:00 1000 mg via ORAL
  Filled 2021-07-22: qty 2

## 2021-07-22 MED ORDER — DOXYCYCLINE HYCLATE 100 MG PO TABS: 100.0000 mg | ORAL_TABLET | Freq: Two times a day (BID) | ORAL | 0 refills | Status: DC

## 2021-07-22 MED ORDER — METHOCARBAMOL 500 MG PO TABS
500.0000 mg | ORAL_TABLET | Freq: Once | ORAL | Status: AC
  Administered 2021-07-22: 18:00:00 500 mg via ORAL
  Filled 2021-07-22: qty 1

## 2021-07-22 MED ORDER — OXYCODONE-ACETAMINOPHEN 5-325 MG PO TABS: 1.0000 | ORAL_TABLET | Freq: Four times a day (QID) | ORAL | 0 refills | Status: DC | PRN

## 2021-07-22 NOTE — ED Triage Notes (Addendum)
Pt states she fell 3/5-pain to lower back radiating down left leg-states she was advised by PCP video visit to come to ED-states rx abx for sinus infection-NAD-steady gait-added she tested +covid either 2/24 or 2/25 ?

## 2021-07-22 NOTE — Discharge Instructions (Signed)
You have fractured the transverse process of your spine and L3 and L4.  This is most likely why your back is hurting so badly.  You can continue ice to the area and avoid excessive twisting, bending or lifting.  Make sure you are still getting up and moving around.  It is okay to take Robaxin with the pain medication as needed.  Follow-up with your regular doctor if pain does not start improving in the next week. ?

## 2021-07-22 NOTE — Patient Instructions (Signed)
-  see inperson care today as we discussed. ? ?-I sent the medication(s) we discussed to your pharmacy: ?Meds ordered this encounter  ?Medications  ? doxycycline (VIBRA-TABS) 100 MG tablet  ?  Sig: Take 1 tablet (100 mg total) by mouth 2 (two) times daily.  ?  Dispense:  14 tablet  ?  Refill:  0  ? ? ?I hope you are feeling better soon! ? ?It was nice to meet you today. I help [facility] out with telemedicine visits on Tuesdays and Thursdays and am happy to help if you need a virtual follow up visit on those days. Otherwise, if you have any concerns or questions following this visit please schedule a follow up visit with your Primary Care office or seek care at a local urgent care clinic to avoid delays in care ? ?

## 2021-07-22 NOTE — ED Provider Notes (Signed)
[facility] EMERGENCY DEPARTMENT Provider Note   [name]: [id number] Arrival date & time: 07/22/21  1620     History  Chief Complaint  Patient presents with   Rhona Leavens Stogner-Comer is a 56 y.o. female.  Is a 56 year old female with a history of interstitial cystitis, depression, IBS, PTSD and prior lower back pain who is presenting today with complaints of significant back pain that then runs down the left hip and thigh after falling down the stairs 4 days ago.  She was coming down the stairs when she slipped fell hitting her tailbone and then falling down 5 additional steps with a hard surface.  She denies hitting her head or loss of consciousness.  However since that time she has had significant pain, bruising and swelling to her back.  It is painful to twist, cough, bend and move her leg.  She denies taking any anticoagulation.  She has had no abdominal pain, chest pain or shortness of breath.  She denies any neck pain but does report having a headache for the last multiple days with ongoing sinus congestion after having COVID 2 weeks ago.  She did take a course of doxycycline last week and denies any fever but reports she still feels pressure in her ears and behind her eyes.  The history is provided by the patient and the spouse.  Fall This is a new problem.      Home Medications Prior to Admission medications   Medication Sig Start Date End Date Taking? Authorizing Provider  oxyCODONE-acetaminophen (PERCOCET/ROXICET) 5-325 MG tablet Take 1 tablet by mouth every 6 (six) hours as needed for severe pain. 07/22/21  Yes Eisley Barber, Alphonzo Lemmings, MD  amphetamine-dextroamphetamine (ADDERALL XR) 15 MG 24 hr capsule Take by mouth every morning. 04/27/21   [provider]  bupivacaine (MARCAINE) 0.5 % injection 15 mLs daily as needed. Pain in bladder 08/31/18   [provider]  buPROPion (WELLBUTRIN XL) 300 MG 24 hr tablet Take 300 mg by mouth every morning.  01/10/16    [provider]  Cholecalciferol 100 MCG (4000 UT) CAPS Take by mouth.    [provider]  clobetasol ointment (TEMOVATE) 0.05 % APPLY SMALL AMOUNT TOPICALLY TO THE AFFECTED AREA 2 TIMES A WEEK 06/26/21   Ardell Isaacs, Forrestine Him, MD  clonazePAM (KLONOPIN) 1 MG tablet Take 2 mg by mouth at bedtime.    [provider]  doxycycline (VIBRA-TABS) 100 MG tablet Take 1 tablet (100 mg total) by mouth 2 (two) times daily. 07/22/21   Terressa Koyanagi, DO  ELMIRON 100 MG capsule Take 100 mg by mouth in the morning and at bedtime. Takes per bladder 07/31/19   [provider]  estradiol (VIVELLE-DOT) 0.05 MG/24HR patch Place 1 patch (0.05 mg total) onto the skin 2 (two) times a week. 05/20/21   Patton Salles, MD  hydrOXYzine (ATARAX/VISTARIL) 25 MG tablet Take 50 mg by mouth at bedtime.  08/31/18   [provider]  lamoTRIgine (LAMICTAL) 100 MG tablet Take 100 mg by mouth daily. 07/05/21   [provider]  Meth-Hyo-M Bl-Na Phos-Ph Sal (URIBEL) 118 MG CAPS Take by mouth as needed.    [provider]  metroNIDAZOLE (METROGEL) 1 % gel Apply topically. 04/26/21   [provider]  nitrofurantoin (MACRODANTIN) 50 MG capsule Take 50 mg by mouth daily.    [provider]  NONFORMULARY OR COMPOUNDED ITEM Gabapentin 6% cream in neutral base.  Apply to  vulva 3 times a day as needed.  Dispense 30 grams 04/22/21   Ardell Isaacs, Forrestine Him, MD  pantoprazole (PROTONIX) 40 MG tablet Take 40 mg by mouth daily. 12/05/20   [provider]  pregabalin (LYRICA) 75 MG capsule Take 1 capsule (75 mg total) by mouth 2 (two) times daily. 06/28/21   Rice, Jamesetta Orleans, MD  progesterone (PROMETRIUM) 200 MG capsule Take 1 capsule (200 mg total) by mouth at bedtime. 07/02/21   Patton Salles, MD  rosuvastatin (CRESTOR) 20 MG tablet Take 1 tablet (20 mg total) by mouth daily. 06/09/21   Kathleene Hazel, MD  sucralfate (CARAFATE) 1 g  tablet 1 tab(s) orally 4 times a day (between meals and at bedtime) for 30 days 03/07/17   [provider]  traZODone (DESYREL) 100 MG tablet Take 100 mg by mouth at bedtime.     [provider]      Allergies    Ibuprofen, Neomycin-bacitracin-polymyxin  [bacitracin-neomycin-polymyxin], Quinolones, Atorvastatin, Benzalkonium chloride, Cefprozil, Cephalexin, Ciprofloxacin, Mederma, Monistat [miconazole], Neomycin-bacitracin zn-polymyx, Polyoxyethylene 40 sorbitol septaoleate [sorbitan], Prednisone, Quinine, Valtrex [valacyclovir hcl], and Adhesive [tape]    Review of Systems   Review of Systems  Physical Exam Updated Vital Signs BP 112/82    Pulse 90    Temp 99.7 F (37.6 C) (Oral)    Resp 17    Ht  (1.651 m)    Wt 68.5 kg    SpO2 100%    BMI 25.13 kg/m  Physical Exam Vitals and nursing note reviewed.  Constitutional:      General: She is not in acute distress.    Appearance: She is well-developed.  HENT:     Head: Normocephalic and atraumatic.     Right Ear: A middle ear effusion is present.     Left Ear: A middle ear effusion is present.     Nose: Rhinorrhea present.  Eyes:     Pupils: Pupils are equal, round, and reactive to light.  Cardiovascular:     Rate and Rhythm: Normal rate and regular rhythm.     Heart sounds: Normal heart sounds. No murmur heard.   No friction rub.  Pulmonary:     Effort: Pulmonary effort is normal.     Breath sounds: Normal breath sounds. No wheezing or rales.  Abdominal:     General: Bowel sounds are normal. There is no distension.     Palpations: Abdomen is soft.     Tenderness: There is no abdominal tenderness. There is no guarding or rebound.  Musculoskeletal:        General: Tenderness present. Normal range of motion.       Arms:     Comments: No edema.  Pain with range of motion of the left hip.  Skin:    General: Skin is warm and dry.     Findings: No rash.  Neurological:     Mental Status: She is alert and  oriented to person, place, and time.     Cranial Nerves: No cranial nerve deficit.     Sensory: No sensory deficit.     Motor: No weakness.  Psychiatric:        Behavior: Behavior normal.     Comments: Mildly depressed    ED Results / Procedures / Treatments   Labs (all labs ordered are listed, but only abnormal results are displayed) Labs Reviewed - No data to display  EKG None  Radiology CT Lumbar Spine Wo Contrast  Result Date: 07/22/2021 CLINICAL DATA:  Larey Seat downstairs.  Back pain. EXAM: CT LUMBAR SPINE WITHOUT CONTRAST TECHNIQUE: Multidetector CT imaging of the lumbar spine was performed without intravenous contrast administration. Multiplanar CT image reconstructions were also generated. RADIATION DOSE REDUCTION: This exam was performed according to the departmental dose-optimization program which includes automated exposure control, adjustment of the mA and/or kV according to patient size and/or use of iterative reconstruction technique. COMPARISON:  None. FINDINGS: Segmentation: There are five lumbar type vertebral bodies. The last full intervertebral disc space is labeled L5-S1. Alignment: Normal Vertebrae: No acute vertebral body fracture. However, there are fractures involving the left transverse processes of L3 and L4. No facet or laminar fractures are identified. No worrisome bone lesions. Benign L2 hemangioma noted. The visualized lower posterior ribs are intact. Paraspinal and other soft tissues: No significant paraspinal or retroperitoneal findings. There is some hematoma surrounding the transverse process fractures. Disc levels: No lumbar disc protrusions, spinal or foraminal stenosis. Mild annular bulge at L4-5. IMPRESSION: 1. Fractures involving the left transverse processes of L3 and L4. No other spinal fractures are identified. 2. Mild annular bulge at L4-5 but no significant disc protrusions, spinal or foraminal stenosis. Electronically Signed   By: Rudie Meyer M.D.   On:  07/22/2021 18:33   DG Hip Unilat W or Wo Pelvis 2-3 Views Left  Result Date: 07/22/2021 CLINICAL DATA:  Fall on March 5th with low back pain radiating down left leg. EXAM: DG HIP (WITH OR WITHOUT PELVIS) 2-3V LEFT COMPARISON:  None. FINDINGS: No acute fracture of the pelvis or left hip. The cortical margins are intact. Femoral head is well seated in the acetabulum. The hip joint spaces preserved. Intact pubic rami. Pubic symphysis and sacroiliac joints are congruent. IMPRESSION: No fracture of the pelvis or left hip. Electronically Signed   By: Narda Rutherford M.D.   On: 07/22/2021 18:23    Procedures Procedures    Medications Ordered in ED Medications  oxyCODONE-acetaminophen (PERCOCET/ROXICET) 5-325 MG per tablet 1 tablet (has no administration in time range)  methocarbamol (ROBAXIN) tablet 500 mg (500 mg Oral Given 07/22/21 1815)  acetaminophen (TYLENOL) tablet 1,000 mg (1,000 mg Oral Given 07/22/21 1815)    ED Course/ Medical Decision Making/ A&P                           Medical Decision Making Amount and/or Complexity of Data Reviewed External Data Reviewed: notes. Radiology: ordered and independent interpretation performed. Decision-making details documented in ED Course.  Risk OTC drugs. Prescription drug management.   Patient is a 56 year old female presenting today with ongoing back pain after a fall 4 days ago.  Patient has significant trauma noted to the lower back so having hip pain.  She is neurovascularly intact at this time however given mechanism concern for underlying injury.  She has no chest or abdominal pain concerning for intrathoracic or intra-abdominal injury.  Vital signs are reassuring.  Patient denies hitting her head and does not take any anticoagulation.  Low suspicion for intracranial abnormalities.  She also reports that she had COVID 2 weeks ago and has had ongoing congestion however on exam she does have bilateral middle ear effusions without evidence of  otitis.  Suspect this is lingering from her recent COVID infection with persistent fluid accumulation.  Low suspicion for acute intercranial process causing her headache and feel that it is sinus related. I independently visualized and interpreted patient's x-rays and her hip film  is negative today.  CT of the lumbar spine shows fractures involving the left L3 and L4 transverse process without other fractures noted also radiology reports mild annular bulging at L4-5 but no significant disc protrusions or foraminal stenosis.  Findings were discussed with the patient and her significant other.  She was given pain control.  Given restrictions and follow-up precautions.  No social determinants affecting her discharge today.        Final Clinical Impression(s) / ED Diagnoses Final diagnoses:  Fall, initial encounter  Closed fracture of transverse process of lumbar vertebra, initial encounter [facility])    Rx / DC Orders ED Discharge Orders          Ordered    oxyCODONE-acetaminophen (PERCOCET/ROXICET) 5-325 MG tablet  Every 6 hours PRN        07/22/21 Therisa Doyne, MD 07/22/21 1903

## 2021-07-22 NOTE — ED Notes (Signed)
ED Provider at bedside. 
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
## 2021-07-26 ENCOUNTER — Other Ambulatory Visit: Payer: BC Managed Care – PPO

## 2021-07-28 ENCOUNTER — Telehealth: Payer: Self-pay | Admitting: Internal Medicine

## 2021-07-28 DIAGNOSIS — M797 Fibromyalgia: Secondary | ICD-10-CM

## 2021-07-28 NOTE — Telephone Encounter (Signed)
Patient called in stating that she seen her rheumatologist Dr.Rice and he prescribed her pregabalin (LYRICA) 75 MG capsule [[id number]]  twice a day. Dr.Rice stated that if GP is comfortable prescribing it, she wouldn't need to go back to see him. ? ?Please advise. ?

## 2021-07-28 NOTE — Telephone Encounter (Signed)
Okay to fill? 

## 2021-07-28 NOTE — Telephone Encounter (Signed)
Fyi ? ?Patient is aware and will call Dr Erika Mccoy if anything changes.  She has had covid for 15 days and fell and fractured L3 and L4 ?

## 2021-08-03 ENCOUNTER — Other Ambulatory Visit: Payer: Self-pay | Admitting: Internal Medicine

## 2021-08-03 ENCOUNTER — Telehealth: Payer: Self-pay | Admitting: Internal Medicine

## 2021-08-03 DIAGNOSIS — M797 Fibromyalgia: Secondary | ICD-10-CM

## 2021-08-03 MED ORDER — PREGABALIN 75 MG PO CAPS: 75.0000 mg | ORAL_CAPSULE | Freq: Two times a day (BID) | ORAL | 0 refills | Status: DC

## 2021-08-03 NOTE — Telephone Encounter (Signed)
Patient called in stating that Dr.Hernandez said that she would cover the pregabalin (LYRICA) 75 MG capsule [[id number]]  medication and send it out to her pharmacy but haven't done it yet. ? ?Patient is requesting a medication refill for pregabalin (LYRICA) 75 MG capsule [[id number]] to be sent to her pharmacy. ? ?Please advise. ?

## 2021-08-03 NOTE — Telephone Encounter (Signed)
Patient is aware 

## 2021-08-03 NOTE — Telephone Encounter (Signed)
Patient called in stating that she fell down the stairs and she fractured her L3 and L4. Patient is on the mends from that and COVID since she's testing negative now. Patient stated that she's getting married Friday and she [place] be flying out Saturday for [place] ? ?Patient stated that her right ear have alot of fluid. She is  still experiencing a cough and nasal congestion from COVID. Patient is afraid that if she fly her ear is going to hurt a lot. Patient wants to know if she could do anything to get fluid out of ear such as drops. ? ?Patient could be contacted at [phone]. ? ?Please advise. ?

## 2021-08-03 NOTE — Telephone Encounter (Signed)
Refill sent and patient is aware. 

## 2021-08-16 ENCOUNTER — Other Ambulatory Visit: Payer: BC Managed Care – PPO

## 2021-08-18 ENCOUNTER — Ambulatory Visit: Payer: BC Managed Care – PPO | Admitting: Psychiatry

## 2021-08-27 ENCOUNTER — Other Ambulatory Visit: Payer: Self-pay | Admitting: Internal Medicine

## 2021-08-27 DIAGNOSIS — M797 Fibromyalgia: Secondary | ICD-10-CM

## 2021-08-30 ENCOUNTER — Encounter: Payer: Self-pay | Admitting: Internal Medicine

## 2021-08-30 ENCOUNTER — Ambulatory Visit: Payer: Self-pay | Admitting: Psychiatry

## 2021-08-30 DIAGNOSIS — M797 Fibromyalgia: Secondary | ICD-10-CM

## 2021-08-31 MED ORDER — PREGABALIN 150 MG PO CAPS: 150.0000 mg | ORAL_CAPSULE | Freq: Two times a day (BID) | ORAL | 2 refills | Status: DC

## 2021-08-31 NOTE — Telephone Encounter (Signed)
It looks like her PCP sent a prescription for 180 capsules of the 75 mg dose that was not picked up. I can send new Rx for the 150 mg BID as discussed.  ?I recommend her to schedule a follow up in 1-2 months since we are changing her dose to check on this.

## 2021-09-20 ENCOUNTER — Other Ambulatory Visit: Payer: Self-pay | Admitting: Obstetrics and Gynecology

## 2021-09-20 DIAGNOSIS — Z1231 Encounter for screening mammogram for malignant neoplasm of breast: Secondary | ICD-10-CM

## 2021-09-21 ENCOUNTER — Other Ambulatory Visit: Payer: BC Managed Care – PPO | Admitting: *Deleted

## 2021-09-21 DIAGNOSIS — E782 Mixed hyperlipidemia: Secondary | ICD-10-CM

## 2021-09-21 LAB — HEPATIC FUNCTION PANEL
ALT: 15 IU/L (ref 0–32)
AST: 13 IU/L (ref 0–40)
Albumin: 4 g/dL (ref 3.8–4.9)
Alkaline Phosphatase: 79 IU/L (ref 44–121)
Bilirubin Total: <0.2 mg/dL (ref 0.0–1.2)
Bilirubin, Direct: <0.1 mg/dL (ref 0.00–0.40)
Total Protein: 6.5 g/dL (ref 6.0–8.5)

## 2021-09-21 LAB — LIPID PANEL
Chol/HDL Ratio: 2.7 ratio (ref 0.0–4.4)
Cholesterol, Total: 134 mg/dL (ref 100–199)
HDL: 50 mg/dL (ref >39)
LDL Chol Calc (NIH): 41 mg/dL (ref 0–99)
Triglycerides: 283 mg/dL — ABNORMAL HIGH (ref 0–149)
VLDL Cholesterol Cal: 43 mg/dL — ABNORMAL HIGH (ref 5–40)

## 2021-09-30 ENCOUNTER — Ambulatory Visit: Payer: BC Managed Care – PPO | Admitting: Psychiatry

## 2021-09-30 DIAGNOSIS — R69 Illness, unspecified: Secondary | ICD-10-CM

## 2021-09-30 DIAGNOSIS — F4010 Social phobia, unspecified: Secondary | ICD-10-CM

## 2021-09-30 DIAGNOSIS — F4310 Post-traumatic stress disorder, unspecified: Secondary | ICD-10-CM

## 2021-09-30 DIAGNOSIS — F5104 Psychophysiologic insomnia: Secondary | ICD-10-CM | POA: Diagnosis not present

## 2021-09-30 DIAGNOSIS — F1210 Cannabis abuse, uncomplicated: Secondary | ICD-10-CM | POA: Diagnosis not present

## 2021-09-30 DIAGNOSIS — F1010 Alcohol abuse, uncomplicated: Secondary | ICD-10-CM

## 2021-09-30 DIAGNOSIS — F3130 Bipolar disorder, current episode depressed, mild or moderate severity, unspecified: Secondary | ICD-10-CM | POA: Diagnosis not present

## 2021-09-30 DIAGNOSIS — M797 Fibromyalgia: Secondary | ICD-10-CM
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
## 2021-10-15 ENCOUNTER — Ambulatory Visit
Admission: RE | Admit: 2021-10-15 | Discharge: 2021-10-15 | Disposition: A | Discharge status: Home / Self Care | Payer: BC Managed Care – PPO | Source: Ambulatory Visit | Attending: Obstetrics and Gynecology | Admitting: Obstetrics and Gynecology

## 2021-10-15 ENCOUNTER — Ambulatory Visit: Payer: BC Managed Care – PPO

## 2021-10-15 DIAGNOSIS — Z1231 Encounter for screening mammogram for malignant neoplasm of breast: Secondary | ICD-10-CM

## 2021-10-28 ENCOUNTER — Encounter: Payer: Self-pay | Admitting: Physician Assistant

## 2021-10-28 ENCOUNTER — Ambulatory Visit: Payer: BC Managed Care – PPO | Admitting: Physician Assistant

## 2021-10-28 VITALS — BP 127/80 | HR 77 | Ht 64.75 in | Wt 159.0 lb

## 2021-10-28 DIAGNOSIS — F4010 Social phobia, unspecified: Secondary | ICD-10-CM | POA: Diagnosis not present

## 2021-10-28 DIAGNOSIS — F319 Bipolar disorder, unspecified: Secondary | ICD-10-CM

## 2021-10-28 DIAGNOSIS — R41840 Attention and concentration deficit: Secondary | ICD-10-CM

## 2021-10-28 DIAGNOSIS — F4310 Post-traumatic stress disorder, unspecified: Secondary | ICD-10-CM

## 2021-10-28 DIAGNOSIS — F332 Major depressive disorder, recurrent severe without psychotic features: Secondary | ICD-10-CM

## 2021-10-28 DIAGNOSIS — G2581 Restless legs syndrome: Secondary | ICD-10-CM

## 2021-10-28 MED ORDER — TRAZODONE HCL 100 MG PO TABS: 100.0000 mg | ORAL_TABLET | Freq: Every day | ORAL | 0 refills | Status: DC

## 2021-10-28 MED ORDER — CLONAZEPAM 1 MG PO TABS: 1.0000 mg | ORAL_TABLET | Freq: Three times a day (TID) | ORAL | 1 refills | Status: DC | PRN

## 2021-10-28 MED ORDER — LAMOTRIGINE 100 MG PO TABS: 100.0000 mg | ORAL_TABLET | Freq: Every day | ORAL | 0 refills | Status: DC
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
## 2021-11-03 ENCOUNTER — Ambulatory Visit: Payer: BC Managed Care – PPO | Admitting: Psychiatry

## 2021-11-03 DIAGNOSIS — F4310 Post-traumatic stress disorder, unspecified: Secondary | ICD-10-CM

## 2021-11-03 DIAGNOSIS — F332 Major depressive disorder, recurrent severe without psychotic features: Secondary | ICD-10-CM | POA: Diagnosis not present

## 2021-11-03 DIAGNOSIS — F5104 Psychophysiologic insomnia: Secondary | ICD-10-CM

## 2021-11-03 DIAGNOSIS — F4010 Social phobia, unspecified: Secondary | ICD-10-CM | POA: Diagnosis not present

## 2021-11-03 DIAGNOSIS — F4320 Adjustment disorder, unspecified: Secondary | ICD-10-CM

## 2021-11-03 DIAGNOSIS — R69 Illness, unspecified: Secondary | ICD-10-CM

## 2021-11-03 DIAGNOSIS — F3130 Bipolar disorder, current episode depressed, mild or moderate severity, unspecified: Secondary | ICD-10-CM

## 2021-11-03 DIAGNOSIS — F1210 Cannabis abuse, uncomplicated: Secondary | ICD-10-CM

## 2021-11-03 DIAGNOSIS — M797 Fibromyalgia: Secondary | ICD-10-CM

## 2021-11-03 DIAGNOSIS — F1010 Alcohol abuse, uncomplicated: Secondary | ICD-10-CM

## 2021-11-03 DIAGNOSIS — Z634 Disappearance and death of family member: Secondary | ICD-10-CM
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
## 2021-11-05 ENCOUNTER — Ambulatory Visit: Payer: BC Managed Care – PPO | Admitting: Obstetrics and Gynecology

## 2021-11-05 ENCOUNTER — Encounter: Payer: Self-pay | Admitting: Obstetrics and Gynecology

## 2021-11-05 VITALS — BP 110/68 | HR 80 | Resp 18

## 2021-11-05 DIAGNOSIS — Z638 Other specified problems related to primary support group: Secondary | ICD-10-CM | POA: Diagnosis not present

## 2021-11-05 DIAGNOSIS — Z1239 Encounter for other screening for malignant neoplasm of breast: Secondary | ICD-10-CM | POA: Diagnosis not present

## 2021-11-05 DIAGNOSIS — N760 Acute vaginitis: Secondary | ICD-10-CM | POA: Diagnosis not present

## 2021-11-05 LAB — WET PREP FOR TRICH, YEAST, CLUE

## 2021-11-05 MED ORDER — FLUCONAZOLE 150 MG PO TABS: 150.0000 mg | ORAL_TABLET | Freq: Once | ORAL | 0 refills | Status: AC
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
## 2021-11-23 ENCOUNTER — Telehealth: Payer: Self-pay | Admitting: Internal Medicine

## 2021-11-23 NOTE — Telephone Encounter (Addendum)
Pt called to inform Dr. Ardyth Harps that her Cardio MD did a Lipid Panel (09/21/2021) Hepatic Function Panel, Echo on 06/08/21 and told her everything looked amazing.   Pt states MD told her that her heart also looks great.   Pt states all her results can be found on MyChart, as well.  Pt was asked if she needed any refills on any of her current meds and Pt stated she was good for now.  If MD needs her to be scheduled for any more exams, please call her at [phone]

## 2021-11-25 ENCOUNTER — Ambulatory Visit: Payer: BC Managed Care – PPO | Admitting: Psychiatry

## 2021-11-25 DIAGNOSIS — R6889 Other general symptoms and signs: Secondary | ICD-10-CM

## 2021-11-25 DIAGNOSIS — F4310 Post-traumatic stress disorder, unspecified: Secondary | ICD-10-CM

## 2021-11-25 DIAGNOSIS — F5104 Psychophysiologic insomnia: Secondary | ICD-10-CM | POA: Diagnosis not present

## 2021-11-25 DIAGNOSIS — Z638 Other specified problems related to primary support group: Secondary | ICD-10-CM

## 2021-11-25 DIAGNOSIS — F4010 Social phobia, unspecified: Secondary | ICD-10-CM | POA: Diagnosis not present

## 2021-11-25 DIAGNOSIS — F1211 Cannabis abuse, in remission: Secondary | ICD-10-CM

## 2021-11-25 DIAGNOSIS — F3130 Bipolar disorder, current episode depressed, mild or moderate severity, unspecified: Secondary | ICD-10-CM

## 2021-11-25 DIAGNOSIS — F1210 Cannabis abuse, uncomplicated: Secondary | ICD-10-CM

## 2021-11-25 DIAGNOSIS — Z62810 Personal history of physical and sexual abuse in childhood: Secondary | ICD-10-CM

## 2021-11-25 DIAGNOSIS — F1021 Alcohol dependence, in remission: Secondary | ICD-10-CM
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
## 2021-11-26 ENCOUNTER — Ambulatory Visit: Payer: BC Managed Care – PPO | Admitting: Obstetrics and Gynecology

## 2021-11-26 ENCOUNTER — Encounter: Payer: Self-pay | Admitting: Physician Assistant

## 2021-11-26 ENCOUNTER — Ambulatory Visit (INDEPENDENT_AMBULATORY_CARE_PROVIDER_SITE_OTHER): Payer: BC Managed Care – PPO | Admitting: Physician Assistant

## 2021-11-26 ENCOUNTER — Encounter: Payer: Self-pay | Admitting: Obstetrics and Gynecology

## 2021-11-26 DIAGNOSIS — F4010 Social phobia, unspecified: Secondary | ICD-10-CM | POA: Diagnosis not present

## 2021-11-26 DIAGNOSIS — F4310 Post-traumatic stress disorder, unspecified: Secondary | ICD-10-CM | POA: Diagnosis not present

## 2021-11-26 DIAGNOSIS — F3130 Bipolar disorder, current episode depressed, mild or moderate severity, unspecified: Secondary | ICD-10-CM | POA: Diagnosis not present

## 2021-11-26 DIAGNOSIS — B9689 Other specified bacterial agents as the cause of diseases classified elsewhere: Secondary | ICD-10-CM | POA: Diagnosis not present

## 2021-11-26 DIAGNOSIS — N760 Acute vaginitis: Secondary | ICD-10-CM | POA: Diagnosis not present

## 2021-11-26 DIAGNOSIS — G2581 Restless legs syndrome: Secondary | ICD-10-CM

## 2021-11-26 DIAGNOSIS — R300 Dysuria: Secondary | ICD-10-CM | POA: Diagnosis not present

## 2021-11-26 LAB — WET PREP FOR TRICH, YEAST, CLUE

## 2021-11-26 MED ORDER — METRONIDAZOLE 0.75 % VA GEL: 1.0000 | Freq: Every day | VAGINAL | 0 refills | Status: DC

## 2021-11-26 MED ORDER — FLUCONAZOLE 150 MG PO TABS: 150.0000 mg | ORAL_TABLET | Freq: Once | ORAL | 0 refills | Status: AC

## 2021-11-26 NOTE — Patient Instructions (Signed)

## 2021-11-26 NOTE — Progress Notes (Unsigned)
[facility]  Patient ID: Erika Mccoy,  MRN: [id number]  PCP: Philip Aspen, Limmie Patricia, MD  Date of Evaluation: 11/26/2021 Time spent:20 minutes  Chief Complaint:  Chief Complaint   Anxiety; Depression; Follow-up     HISTORY/CURRENT STATUS: HPI For 1 month med check.  At initial visit last month, we made no changes in meds for depression b/c she was starting Spravato the next week. She feels 'lighter.' Now showers more, shaves her legs, had a mani/pedi for the first time in a long time. Not crying easily or without reason, energy and motivation are much better, quit eating CBD gummies and decreased alcohol intake. Feels a lot better. Wonders if she could feel even better but this is the best shes felt in years so doesn't want to change anything right now. States she is 'protecting' herself more, with both physical and mental health. Setting boundaries more than she has. Not isolating. No SI/HI.   States that attention is good without easy distractibility.  Able to focus on things and finish tasks to completion. Takes Adderall on rare occasions when needs to focus more or needs more energy.   Sleeps pretty well most of the time. Takes Trazodone prn. Anxiety is well-controlled with Klonopin. Not having PA but more of generalized anxiety.  Patient denies increased energy with decreased need for sleep, no increased talkativeness, no racing thoughts, no impulsivity or risky behaviors, no increased spending, no increased libido, no grandiosity, no increased irritability or anger, no paranoia, and no hallucinations.  Denies dizziness, syncope, seizures, numbness, tingling, tremor, tics, unsteady gait, slurred speech, confusion. Denies muscle or joint pain, stiffness, or dystonia.  Individual Medical History/ Review of Systems: Changes? :No   Past Psychiatric History:    She voluntarily committed herself in 2004 and 3 times since then. For depression. Never  attempted suicide.    Past medications for mental health diagnoses include: Prozac didn't work, Effexor, Wellbutrin, Seroquel, Vraylar, Paxil, Zoloft, Cymbalta, Pristiq, Klonopin, Xanax, Trazodone, Ambien, Lunesta, Spravato started 10/2021.    Never had ECT, TMS  Allergies: Ibuprofen, Neomycin-bacitracin-polymyxin  [bacitracin-neomycin-polymyxin], Quinolones, Atorvastatin, Benzalkonium chloride, Cefprozil, Cephalexin, Ciprofloxacin, Mederma, Monistat [miconazole], Neomycin-bacitracin zn-polymyx, Polyoxyethylene 40 sorbitol septaoleate [sorbitan], Prednisone, Quinine, Valtrex [valacyclovir hcl], and Adhesive [tape]  Current Medications:  Current Outpatient Medications:    amphetamine-dextroamphetamine (ADDERALL XR) 15 MG 24 hr capsule, Take 15 mg by mouth every morning. prn, Disp: , Rfl:    bupivacaine (MARCAINE) 0.5 % injection, 15 mLs daily as needed. Pain in bladder, Disp: , Rfl:    buPROPion (WELLBUTRIN XL) 300 MG 24 hr tablet, Take 300 mg by mouth every morning. , Disp: , Rfl: 0   Cholecalciferol 100 MCG (4000 UT) CAPS, Take by mouth., Disp: , Rfl:    clobetasol ointment (TEMOVATE) 0.05 %, APPLY SMALL AMOUNT TOPICALLY TO THE AFFECTED AREA 2 TIMES A WEEK, Disp: 60 g, Rfl: 0   clonazePAM (KLONOPIN) 1 MG tablet, Take 1 tablet (1 mg total) by mouth 3 (three) times daily as needed for anxiety., Disp: 90 tablet, Rfl: 1   ELMIRON 100 MG capsule, Take 100 mg by mouth in the morning and at bedtime. Takes per bladder, Disp: , Rfl:    estradiol (VIVELLE-DOT) 0.05 MG/24HR patch, Place 1 patch (0.05 mg total) onto the skin 2 (two) times a week., Disp: 24 patch, Rfl: 3   hydrOXYzine (ATARAX/VISTARIL) 25 MG tablet, Take 50 mg by mouth at bedtime. , Disp: , Rfl:    lamoTRIgine (LAMICTAL) 100 MG tablet, Take 1 tablet (  100 mg total) by mouth daily., Disp: 90 tablet, Rfl: 0   Meth-Hyo-M Bl-Na Phos-Ph Sal (URIBEL) 118 MG CAPS, Take by mouth as needed., Disp: , Rfl:    methocarbamol (ROBAXIN) 500 MG tablet, Take  500 mg by mouth as needed., Disp: , Rfl:    metroNIDAZOLE (METROGEL) 1 % gel, Apply topically., Disp: , Rfl:    nitrofurantoin (MACRODANTIN) 50 MG capsule, Take 50 mg by mouth daily., Disp: , Rfl:    NONFORMULARY OR COMPOUNDED ITEM, Gabapentin 6% cream in neutral base.  Apply to vulva 3 times a day as needed.  Dispense 30 grams, Disp: 30 each, Rfl: 0   pantoprazole (PROTONIX) 40 MG tablet, Take 40 mg by mouth daily., Disp: , Rfl:    pregabalin (LYRICA) 150 MG capsule, Take 1 capsule (150 mg total) by mouth 2 (two) times daily., Disp: 60 capsule, Rfl: 2   progesterone (PROMETRIUM) 200 MG capsule, Take 1 capsule (200 mg total) by mouth at bedtime., Disp: 90 capsule, Rfl: 3   rosuvastatin (CRESTOR) 20 MG tablet, Take 1 tablet (20 mg total) by mouth daily., Disp: 30 tablet, Rfl: 11   traZODone (DESYREL) 100 MG tablet, Take 1 tablet (100 mg total) by mouth at bedtime. (Patient taking differently: Take 50-100 mg by mouth at bedtime as needed.), Disp: 90 tablet, Rfl: 0   doxycycline (VIBRA-TABS) 100 MG tablet, Take 1 tablet (100 mg total) by mouth 2 (two) times daily., Disp: 14 tablet, Rfl: 0   metroNIDAZOLE (METROGEL) 0.75 % vaginal gel, Place 1 Applicatorful vaginally at bedtime., Disp: 70 g, Rfl: 0   oxyCODONE-acetaminophen (PERCOCET/ROXICET) 5-325 MG tablet, Take 1 tablet by mouth every 6 (six) hours as needed for severe pain., Disp: 10 tablet, Rfl: 0   sucralfate (CARAFATE) 1 g tablet, , Disp: , Rfl:  Medication Side Effects: none  Family Medical/ Social History: Changes?  no  MENTAL HEALTH EXAM:  There were no vitals taken for this visit.There is no height or weight on file to calculate BMI.  General Appearance: Casual and Well Groomed  Eye Contact:  Good  Speech:  Clear and Coherent and Normal Rate  Volume:  Normal  Mood:  Euthymic  Affect:  Congruent  Thought Process:  Goal Directed and Descriptions of Associations: Circumstantial  Orientation:  Full (Time, Place, and Person)  Thought  Content: Logical   Suicidal Thoughts:  No  Homicidal Thoughts:  No  Memory:  WNL  Judgement:  Good  Insight:  Good  Psychomotor Activity:  Normal  Concentration:  Concentration: Good  Recall:  Good  Fund of Knowledge: Good  Language: Good  Assets:  Desire for Improvement  ADL's:  Intact  Cognition: WNL  Prognosis:  Good    DIAGNOSES:    ICD-10-CM   1. Bipolar I disorder, most recent episode depressed (HCC)  F31.30     2. PTSD (post-traumatic stress disorder)  F43.10     3. Social anxiety disorder  F40.10     4. Restless leg syndrome  G25.81       Receiving Psychotherapy: Yes   with Dr. [name]   RECOMMENDATIONS:  PDMP reviewed.  Spravato filled 11/22/2021.  Lyrica filled 11/15/2021.  Klonopin filled 10/28/2021. I provided 20 minutes of face to face time during this encounter, including time spent before and after the visit in records review, medical decision making, counseling pertinent to today's visit, and charting.   She's responding very well to Spravato! We agree to make no changes in other meds, she's only  been on Spravato not quite a month so we're hoping she improves even more after being on it longer.   Continue Adderall XR 15 mg daily prn. Cont Wellbutrin XL 300 mg, 1 q am. Continue Klonopin 1 mg, 1 po tid prn. Continue Spravato 84 mg, twice/wk per another provider. Cont Hydroxyzine 25 mg, 2 po qhs.  Cont Lamictal 100 mg 1 po qd. Continue Trazodone 100 mg, 1/2-1 qhs prn.  Continue therapy with Dr. [name]. Return in 6 weeks.   Melony Overly, PA-C
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
## 2021-11-28 LAB — URINE CULTURE
MICRO NUMBER:: 13648524
Result:: NO GROWTH
SPECIMEN QUALITY:: ADEQUATE

## 2021-11-28 LAB — CULTURE INDICATED

## 2021-11-28 LAB — URINALYSIS, COMPLETE W/RFL CULTURE
Bilirubin Urine: NEGATIVE
Casts: NONE SEEN /LPF
Crystals: NONE SEEN /HPF
Glucose, UA: NEGATIVE
Hyaline Cast: NONE SEEN /LPF
Ketones, ur: NEGATIVE
Leukocyte Esterase: NEGATIVE
Nitrites, Initial: NEGATIVE
Protein, ur: NEGATIVE
Specific Gravity, Urine: 1.02 (ref 1.001–1.035)
Yeast: NONE SEEN /HPF
pH: 5.5 (ref 5.0–8.0)

## 2021-11-30 ENCOUNTER — Ambulatory Visit: Payer: BC Managed Care – PPO | Admitting: Psychiatry

## 2021-11-30 DIAGNOSIS — F4010 Social phobia, unspecified: Secondary | ICD-10-CM | POA: Diagnosis not present

## 2021-11-30 DIAGNOSIS — Z638 Other specified problems related to primary support group: Secondary | ICD-10-CM

## 2021-11-30 DIAGNOSIS — F5104 Psychophysiologic insomnia: Secondary | ICD-10-CM

## 2021-11-30 DIAGNOSIS — F1211 Cannabis abuse, in remission: Secondary | ICD-10-CM

## 2021-11-30 DIAGNOSIS — F1021 Alcohol dependence, in remission: Secondary | ICD-10-CM

## 2021-11-30 DIAGNOSIS — F4310 Post-traumatic stress disorder, unspecified: Secondary | ICD-10-CM | POA: Diagnosis not present

## 2021-11-30 DIAGNOSIS — F3130 Bipolar disorder, current episode depressed, mild or moderate severity, unspecified: Secondary | ICD-10-CM
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
## 2021-12-03 ENCOUNTER — Telehealth: Payer: Self-pay | Admitting: Physician Assistant

## 2021-12-03 ENCOUNTER — Other Ambulatory Visit: Payer: Self-pay | Admitting: Physician Assistant

## 2021-12-03 MED ORDER — BUPROPION HCL ER (XL) 150 MG PO TB24: 150.0000 mg | ORAL_TABLET | Freq: Every day | ORAL | 0 refills | Status: DC

## 2021-12-03 NOTE — Telephone Encounter (Signed)
Called patient. She wants to continue with the Spravato, but doesn't think she is where she should be. She said she has had 4 weeks of therapy thus far. She said she is able to get up and get going, and her outlook is better. She said there are a lot of family situations going on right now. She said she doesn't feel more depression because of this but is sad. She said you had discussed increasing Wellbutrin from 300 to 450 mg and she would like to do that. She said she had a lot of 300 mg tablets on hand.  Pharmacy is [facility] on [address]. Patient was tearful during the conversation.

## 2021-12-03 NOTE — Telephone Encounter (Signed)
Pt lvm at 3:39 pm stating that she doesn't think spravato is going to work. She said did feel lighter abut after talking to her son she feels like she is going down a big hole. She would like teresa to increase her wellbutrin. Please call her at [phone].

## 2021-12-03 NOTE — Telephone Encounter (Signed)
Prescription for Wellbutrin XL 150 mg sent to the pharmacy, add to 300 mg to equal 450 mg daily.

## 2021-12-08 ENCOUNTER — Ambulatory Visit (INDEPENDENT_AMBULATORY_CARE_PROVIDER_SITE_OTHER): Payer: BC Managed Care – PPO | Admitting: Psychiatry

## 2021-12-08 DIAGNOSIS — F4310 Post-traumatic stress disorder, unspecified: Secondary | ICD-10-CM | POA: Diagnosis not present

## 2021-12-08 DIAGNOSIS — F1211 Cannabis abuse, in remission: Secondary | ICD-10-CM

## 2021-12-08 DIAGNOSIS — F3130 Bipolar disorder, current episode depressed, mild or moderate severity, unspecified: Secondary | ICD-10-CM | POA: Diagnosis not present

## 2021-12-08 DIAGNOSIS — Z638 Other specified problems related to primary support group: Secondary | ICD-10-CM | POA: Diagnosis not present

## 2021-12-08 DIAGNOSIS — R6889 Other general symptoms and signs: Secondary | ICD-10-CM

## 2021-12-08 DIAGNOSIS — F4010 Social phobia, unspecified: Secondary | ICD-10-CM

## 2021-12-08 DIAGNOSIS — F5104 Psychophysiologic insomnia: Secondary | ICD-10-CM

## 2021-12-08 DIAGNOSIS — M797 Fibromyalgia: Secondary | ICD-10-CM

## 2021-12-08 DIAGNOSIS — F1021 Alcohol dependence, in remission: Secondary | ICD-10-CM
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
## 2021-12-14 ENCOUNTER — Ambulatory Visit: Payer: BC Managed Care – PPO | Admitting: Psychiatry

## 2021-12-21 ENCOUNTER — Ambulatory Visit: Payer: BC Managed Care – PPO | Admitting: Psychiatry

## 2021-12-29 ENCOUNTER — Telehealth: Payer: Self-pay | Admitting: Psychiatry

## 2021-12-29 DIAGNOSIS — Z634 Disappearance and death of family member: Secondary | ICD-10-CM
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
## 2021-12-29 NOTE — Telephone Encounter (Signed)
Talli just called. She says that her children's father was found dead in hospital. And her children are "crashing." She says you always make her feel better and if you have some time please give her a call. Ph: [phone]

## 2021-12-30 ENCOUNTER — Telehealth: Payer: Self-pay | Admitting: Physician Assistant

## 2021-12-30 ENCOUNTER — Other Ambulatory Visit: Payer: Self-pay | Admitting: Physician Assistant

## 2021-12-30 ENCOUNTER — Other Ambulatory Visit: Payer: Self-pay

## 2021-12-30 MED ORDER — CLONAZEPAM 1 MG PO TABS: 1.0000 mg | ORAL_TABLET | Freq: Three times a day (TID) | ORAL | 1 refills | Status: DC | PRN

## 2021-12-30 NOTE — Telephone Encounter (Signed)
Pended.

## 2021-12-30 NOTE — Telephone Encounter (Signed)
Pt called upset reporting her children father just passed unexpectedly. Requesting refill for Clonazepam. [facility]. [address]

## 2022-01-07 ENCOUNTER — Telehealth (INDEPENDENT_AMBULATORY_CARE_PROVIDER_SITE_OTHER): Payer: BC Managed Care – PPO | Admitting: Physician Assistant

## 2022-01-07 ENCOUNTER — Encounter: Payer: Self-pay | Admitting: Physician Assistant

## 2022-01-07 DIAGNOSIS — F4310 Post-traumatic stress disorder, unspecified: Secondary | ICD-10-CM

## 2022-01-07 DIAGNOSIS — F3130 Bipolar disorder, current episode depressed, mild or moderate severity, unspecified: Secondary | ICD-10-CM

## 2022-01-07 DIAGNOSIS — Z634 Disappearance and death of family member: Secondary | ICD-10-CM | POA: Diagnosis not present

## 2022-01-07 DIAGNOSIS — F411 Generalized anxiety disorder: Secondary | ICD-10-CM | POA: Diagnosis not present

## 2022-01-07 MED ORDER — LAMOTRIGINE 100 MG PO TABS: 100.0000 mg | ORAL_TABLET | Freq: Every day | ORAL | 3 refills | Status: DC

## 2022-01-07 MED ORDER — TRAZODONE HCL 100 MG PO TABS: 100.0000 mg | ORAL_TABLET | Freq: Every day | ORAL | 3 refills | Status: DC
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
## 2022-01-13 ENCOUNTER — Ambulatory Visit (HOSPITAL_COMMUNITY)
Admission: EM | Admit: 2022-01-13 | Discharge: 2022-01-13 | Disposition: A | Discharge status: Home / Self Care | Payer: BC Managed Care – PPO | Attending: Family Medicine | Admitting: Family Medicine

## 2022-01-13 ENCOUNTER — Encounter (HOSPITAL_COMMUNITY): Payer: Self-pay

## 2022-01-13 DIAGNOSIS — J0100 Acute maxillary sinusitis, unspecified: Secondary | ICD-10-CM

## 2022-01-13 DIAGNOSIS — H6981 Other specified disorders of Eustachian tube, right ear: Secondary | ICD-10-CM | POA: Diagnosis not present

## 2022-01-13 DIAGNOSIS — R42 Dizziness and giddiness: Secondary | ICD-10-CM

## 2022-01-13 MED ORDER — PREDNISONE 20 MG PO TABS: 20.0000 mg | ORAL_TABLET | Freq: Every day | ORAL | 0 refills | Status: AC

## 2022-01-13 MED ORDER — MECLIZINE HCL 25 MG PO TABS: 25.0000 mg | ORAL_TABLET | Freq: Three times a day (TID) | ORAL | 0 refills | Status: DC | PRN

## 2022-01-13 MED ORDER — AMOXICILLIN-POT CLAVULANATE 875-125 MG PO TABS: 1.0000 | ORAL_TABLET | Freq: Two times a day (BID) | ORAL | 0 refills | Status: DC

## 2022-01-13 NOTE — Discharge Instructions (Signed)
You were seen today for vertigo, likely due to sinus infection and eustachian tube dysfunction.  I have sent out meclizine for dizziness, a low dose steroid, and augmentin to take with food to avoid an upset stomach.  I recommend taking claritin or zyrtec once daily to help with sinus and ear fullness.  If you have worsening symptoms despite treatment, then please go to the ER for further evaluation.

## 2022-01-13 NOTE — ED Triage Notes (Signed)
Pt c/o hx of vertigo and rt ear fullness. States last night developed a headache and became dizzy and pain to rt ear. States pain worse this morning, states see a ENT.
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
## 2022-01-18 ENCOUNTER — Ambulatory Visit (INDEPENDENT_AMBULATORY_CARE_PROVIDER_SITE_OTHER): Payer: BC Managed Care – PPO | Admitting: Psychiatry

## 2022-01-18 DIAGNOSIS — F5104 Psychophysiologic insomnia: Secondary | ICD-10-CM

## 2022-01-18 DIAGNOSIS — F3130 Bipolar disorder, current episode depressed, mild or moderate severity, unspecified: Secondary | ICD-10-CM | POA: Diagnosis not present

## 2022-01-18 DIAGNOSIS — Z634 Disappearance and death of family member: Secondary | ICD-10-CM | POA: Diagnosis not present

## 2022-01-18 DIAGNOSIS — R6889 Other general symptoms and signs: Secondary | ICD-10-CM

## 2022-01-18 DIAGNOSIS — F411 Generalized anxiety disorder: Secondary | ICD-10-CM

## 2022-01-18 DIAGNOSIS — F4310 Post-traumatic stress disorder, unspecified: Secondary | ICD-10-CM

## 2022-01-18 DIAGNOSIS — Z638 Other specified problems related to primary support group: Secondary | ICD-10-CM
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
## 2022-01-25 ENCOUNTER — Ambulatory Visit: Payer: BC Managed Care – PPO | Admitting: Nurse Practitioner

## 2022-01-25 ENCOUNTER — Encounter: Payer: Self-pay | Admitting: Nurse Practitioner

## 2022-01-25 VITALS — BP 92/60 | HR 88

## 2022-01-25 DIAGNOSIS — N904 Leukoplakia of vulva: Secondary | ICD-10-CM

## 2022-01-25 DIAGNOSIS — N898 Other specified noninflammatory disorders of vagina: Secondary | ICD-10-CM | POA: Diagnosis not present

## 2022-01-25 LAB — WET PREP FOR TRICH, YEAST, CLUE
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
## 2022-01-28 ENCOUNTER — Ambulatory Visit: Payer: BC Managed Care – PPO | Admitting: Psychiatry

## 2022-01-28 DIAGNOSIS — Z638 Other specified problems related to primary support group: Secondary | ICD-10-CM

## 2022-01-28 DIAGNOSIS — F4310 Post-traumatic stress disorder, unspecified: Secondary | ICD-10-CM | POA: Diagnosis not present

## 2022-01-28 DIAGNOSIS — F411 Generalized anxiety disorder: Secondary | ICD-10-CM | POA: Diagnosis not present

## 2022-01-28 DIAGNOSIS — F1021 Alcohol dependence, in remission: Secondary | ICD-10-CM

## 2022-01-28 DIAGNOSIS — F3130 Bipolar disorder, current episode depressed, mild or moderate severity, unspecified: Secondary | ICD-10-CM | POA: Diagnosis not present

## 2022-01-28 DIAGNOSIS — Z634 Disappearance and death of family member: Secondary | ICD-10-CM | POA: Diagnosis not present

## 2022-01-28 DIAGNOSIS — F1211 Cannabis abuse, in remission: Secondary | ICD-10-CM

## 2022-01-28 DIAGNOSIS — L900 Lichen sclerosus et atrophicus: Secondary | ICD-10-CM

## 2022-01-28 DIAGNOSIS — R6889 Other general symptoms and signs: Secondary | ICD-10-CM

## 2022-01-28 DIAGNOSIS — F5104 Psychophysiologic insomnia: Secondary | ICD-10-CM

## 2022-01-28 DIAGNOSIS — F4010 Social phobia, unspecified: Secondary | ICD-10-CM

## 2022-01-28 DIAGNOSIS — M797 Fibromyalgia: Secondary | ICD-10-CM
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
## 2022-02-08 ENCOUNTER — Ambulatory Visit: Payer: BC Managed Care – PPO | Admitting: Psychiatry

## 2022-02-11 ENCOUNTER — Other Ambulatory Visit: Payer: Self-pay

## 2022-02-11 ENCOUNTER — Telehealth: Payer: Self-pay | Admitting: Psychiatry

## 2022-02-11 DIAGNOSIS — R102 Pelvic and perineal pain: Secondary | ICD-10-CM

## 2022-02-11 MED ORDER — NONFORMULARY OR COMPOUNDED ITEM: 1 refills | Status: DC

## 2022-02-11 MED ORDER — AMPHETAMINE-DEXTROAMPHET ER 30 MG PO CP24: 30.0000 mg | ORAL_CAPSULE | Freq: Every day | ORAL | 0 refills | Status: DC

## 2022-02-11 NOTE — Progress Notes (Signed)
Patient encounter canceled and no charge due to miscommunication planning to follow-up after starting new medication for symptoms she just received this and so no new reason to follow-up yet.

## 2022-02-11 NOTE — Telephone Encounter (Signed)
Last AEX 05/19/2021--nothing scheduled for 05/2022 yet.   OK to call in refill of gabapentin cream to [facility]?

## 2022-02-11 NOTE — Telephone Encounter (Signed)
Pended.

## 2022-02-11 NOTE — Telephone Encounter (Signed)
Erika Mccoy called at 1:10 to request refill of her Adderall.  Send to [facility] on [address].  They have it.  I realized after the call that she needs a f/u so I called her back and LM for her to call to set appt.

## 2022-02-11 NOTE — Telephone Encounter (Signed)
Pt called back and said it was adderall xr 30 mg

## 2022-02-11 NOTE — Telephone Encounter (Signed)
LVM to let us know the dose

## 2022-02-17 ENCOUNTER — Other Ambulatory Visit: Payer: Self-pay | Admitting: Internal Medicine

## 2022-02-17 DIAGNOSIS — M797 Fibromyalgia: Secondary | ICD-10-CM

## 2022-02-17 NOTE — Telephone Encounter (Signed)
Next Visit: not on file  Last Visit: 06/16/2021  Last Fill: 08/31/2021  Dx: Fibromyalgia  Current Dose per office note on 06/28/2021: Lyrica 75 mg BID  Okay to refill Lyrica?  When should patient follow up in office?

## 2022-02-21 HISTORY — PX: OTHER SURGICAL HISTORY: SHX169

## 2022-02-21 NOTE — Telephone Encounter (Signed)
Left message advising that patient is very overdue for follow-up after medication adjustment and needs to schedule follow-up at the clinic or telemedicine visit to review.

## 2022-02-21 NOTE — Telephone Encounter (Signed)
Patient is very overdue for follow-up after medication adjustment and I am not sure how she is taking this currently.  Needs to schedule follow-up at the clinic or telemedicine visit to review.

## 2022-02-22 ENCOUNTER — Ambulatory Visit: Payer: BC Managed Care – PPO | Admitting: Psychiatry

## 2022-03-02 ENCOUNTER — Other Ambulatory Visit: Payer: Self-pay | Admitting: Physician Assistant

## 2022-03-02 NOTE — Telephone Encounter (Signed)
Please call patient to schedule an appt.-

## 2022-03-11 ENCOUNTER — Ambulatory Visit (INDEPENDENT_AMBULATORY_CARE_PROVIDER_SITE_OTHER): Payer: BC Managed Care – PPO | Admitting: Psychiatry

## 2022-03-11 DIAGNOSIS — F3130 Bipolar disorder, current episode depressed, mild or moderate severity, unspecified: Secondary | ICD-10-CM | POA: Diagnosis not present

## 2022-03-11 DIAGNOSIS — Z634 Disappearance and death of family member: Secondary | ICD-10-CM

## 2022-03-11 DIAGNOSIS — Z638 Other specified problems related to primary support group: Secondary | ICD-10-CM | POA: Diagnosis not present

## 2022-03-11 DIAGNOSIS — F411 Generalized anxiety disorder: Secondary | ICD-10-CM

## 2022-03-11 DIAGNOSIS — F4310 Post-traumatic stress disorder, unspecified: Secondary | ICD-10-CM

## 2022-03-11 DIAGNOSIS — R6889 Other general symptoms and signs: Secondary | ICD-10-CM

## 2022-03-11 DIAGNOSIS — F5104 Psychophysiologic insomnia: Secondary | ICD-10-CM

## 2022-03-11 DIAGNOSIS — Z59869 Financial insecurity, unspecified: Secondary | ICD-10-CM

## 2022-03-11 DIAGNOSIS — F4010 Social phobia, unspecified: Secondary | ICD-10-CM

## 2022-03-11 DIAGNOSIS — Z5986 Financial insecurity: Secondary | ICD-10-CM
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
## 2022-03-14 ENCOUNTER — Ambulatory Visit (INDEPENDENT_AMBULATORY_CARE_PROVIDER_SITE_OTHER): Payer: BC Managed Care – PPO | Admitting: Nurse Practitioner

## 2022-03-14 ENCOUNTER — Other Ambulatory Visit: Payer: Self-pay | Admitting: Internal Medicine

## 2022-03-14 VITALS — BP 122/74 | HR 91

## 2022-03-14 DIAGNOSIS — N763 Subacute and chronic vulvitis: Secondary | ICD-10-CM | POA: Diagnosis not present

## 2022-03-14 DIAGNOSIS — N951 Menopausal and female climacteric states: Secondary | ICD-10-CM | POA: Diagnosis not present

## 2022-03-14 DIAGNOSIS — M797 Fibromyalgia: Secondary | ICD-10-CM

## 2022-03-14 NOTE — Telephone Encounter (Signed)
Next Visit: 03/16/2022  Last Visit: 07/07/2021  Last Fill: 08/31/2021  Dx: Fibromyalgia  Current Dose per office note on 07/07/2021: dosage not discussed.  Okay to refill Lyrica?

## 2022-03-14 NOTE — Progress Notes (Signed)
   Acute Office Visit  Subjective:    Patient ID: Erika Mccoy, female    DOB: 09/14/1965, 56 y.o.   MRN: [id number]   HPI 56 y.o. presents today for vulvitis and to discuss HRT. She is on Vivelle patch 0.05 mg and Prometrium 200 mg nightly. She is wondering if she should stop using. She still experiences hot flashes, night sweats, sleep disturbance and mood changes. Has had a lot going on regarding her health this year. Sees rheumatology tomorrow. She has been using Clobetasol ointment more consistently since being seen in September. Symptoms are better but she is still having hard time getting it under control. She was initially using it daily, but has been using BID x 4-5 days a week. Also has internal pain with intercourse. 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    Review of Systems  Constitutional: Negative.   Endocrine: Positive for heat intolerance.  Genitourinary:  Positive for dyspareunia and vaginal pain (Vulvar pain/burning). Negative for genital sores, vaginal bleeding and vaginal discharge.  Psychiatric/Behavioral:  Positive for sleep disturbance.        Mood changes       Objective:    Physical Exam Constitutional:      Appearance: Normal appearance.  Genitourinary:      Comments: Atrophic changes. Redness at posterior vestibule, small tear (recent intercourse per patient)    BP 122/74   Pulse 91   SpO2 98%  Wt Readings from Last 3 Encounters:  07/22/21 151 lb (68.5 kg)  07/22/21 150 lb (68 kg)  07/07/21 158 lb (71.7 kg)        Patient informed chaperone available to be present for breast and/or pelvic exam. Patient has requested no chaperone to be present. Patient has been advised what will be completed during breast and pelvic exam.   Assessment & Plan:   Problem List Items Addressed This Visit   None Visit Diagnoses     Chronic vulvitis    -  Primary   Vasomotor symptoms due to menopause       Menopausal vaginal dryness           Plan: Vaginal estrogen twice weekly for atrophy and painful intercourse. Recommend also applying externally. She reports having some at home. Will check expiration. Continue Clobetasol twice weekly. Keep area clean and dry, avoid hard soaps/lotions. Discussed HRT and management of vasomotor symptoms. She wants to continue for now.      Tamela Gammon DNP, 4:29 PM 03/14/2022
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
## 2022-03-16 ENCOUNTER — Encounter: Payer: Self-pay | Admitting: Internal Medicine

## 2022-03-16 ENCOUNTER — Ambulatory Visit (INDEPENDENT_AMBULATORY_CARE_PROVIDER_SITE_OTHER): Payer: BC Managed Care – PPO | Admitting: Physician Assistant

## 2022-03-16 ENCOUNTER — Ambulatory Visit: Payer: BC Managed Care – PPO | Attending: Internal Medicine | Admitting: Internal Medicine

## 2022-03-16 ENCOUNTER — Encounter: Payer: Self-pay | Admitting: Physician Assistant

## 2022-03-16 VITALS — BP 122/80 | HR 90 | Ht 64.75 in | Wt 155.4 lb

## 2022-03-16 DIAGNOSIS — N3010 Interstitial cystitis (chronic) without hematuria: Secondary | ICD-10-CM

## 2022-03-16 DIAGNOSIS — R5383 Other fatigue: Secondary | ICD-10-CM

## 2022-03-16 DIAGNOSIS — F3130 Bipolar disorder, current episode depressed, mild or moderate severity, unspecified: Secondary | ICD-10-CM

## 2022-03-16 DIAGNOSIS — F411 Generalized anxiety disorder: Secondary | ICD-10-CM | POA: Diagnosis not present

## 2022-03-16 DIAGNOSIS — G43709 Chronic migraine without aura, not intractable, without status migrainosus: Secondary | ICD-10-CM

## 2022-03-16 DIAGNOSIS — J45909 Unspecified asthma, uncomplicated: Secondary | ICD-10-CM | POA: Insufficient documentation

## 2022-03-16 DIAGNOSIS — R768 Other specified abnormal immunological findings in serum: Secondary | ICD-10-CM

## 2022-03-16 DIAGNOSIS — F99 Mental disorder, not otherwise specified: Secondary | ICD-10-CM

## 2022-03-16 DIAGNOSIS — M797 Fibromyalgia: Secondary | ICD-10-CM

## 2022-03-16 DIAGNOSIS — F5105 Insomnia due to other mental disorder: Secondary | ICD-10-CM | POA: Diagnosis not present

## 2022-03-16 DIAGNOSIS — L900 Lichen sclerosus et atrophicus: Secondary | ICD-10-CM

## 2022-03-16 DIAGNOSIS — M75102 Unspecified rotator cuff tear or rupture of left shoulder, not specified as traumatic: Secondary | ICD-10-CM

## 2022-03-16 DIAGNOSIS — R5381 Other malaise: Secondary | ICD-10-CM

## 2022-03-16 DIAGNOSIS — K589 Irritable bowel syndrome without diarrhea: Secondary | ICD-10-CM

## 2022-03-16 DIAGNOSIS — G9332 Myalgic encephalomyelitis/chronic fatigue syndrome: Secondary | ICD-10-CM

## 2022-03-16 DIAGNOSIS — Z8679 Personal history of other diseases of the circulatory system: Secondary | ICD-10-CM

## 2022-03-16 DIAGNOSIS — R21 Rash and other nonspecific skin eruption: Secondary | ICD-10-CM | POA: Insufficient documentation

## 2022-03-16 MED ORDER — METHYLPHENIDATE HCL 10 MG PO TABS: 5.0000 mg | ORAL_TABLET | Freq: Every day | ORAL | 0 refills | Status: DC | PRN

## 2022-03-16 MED ORDER — CLONAZEPAM 1 MG PO TABS
ORAL_TABLET | ORAL | 2 refills | Status: DC
Start: 2022-03-16 — End: 2022-06-24

## 2022-03-16 NOTE — Progress Notes (Signed)
[facility] Check  Patient ID: Erika Mccoy,  MRN: [id number]  PCP: Isaac Bliss, Rayford Halsted, MD  Date of Evaluation: 03/16/2022 Time spent:40 minutes  Chief Complaint:  Chief Complaint   Anxiety; Depression; Fatigue    HISTORY/CURRENT STATUS: HPI  for routine med check.  Tired all the time. "I'm tired of telling people I'm tired." Saw Rheumatologist this morning who ordered labs. She wants to feel good enough to go to the pool, feels like walking and doing exercises in the pool will help her feel better overall.  The Adderall does not help with motivation.  She has not ever taken it daily but feels like it should do something when she does take it.  Even with all her physical issues going on she feels that she is in a good place mentally.  If she had more energy and was not so tired all the time, states she would be feeling well.  ADLs and personal hygiene are normal.  Appetite is normal and weight is stable.  Not isolating.  Anxiety is an ongoing problem.  No suicidal or homicidal thoughts.  Patient denies increased energy with decreased need for sleep, increased talkativeness, racing thoughts, impulsivity or risky behaviors, increased spending, increased libido, grandiosity, increased irritability or anger, paranoia, or hallucinations.  Review of Systems  Constitutional:  Positive for malaise/fatigue.  HENT: Negative.    Eyes: Negative.   Respiratory:  Positive for cough and shortness of breath.        Asthma  Cardiovascular: Negative.   Gastrointestinal: Negative.   Genitourinary:        Chronic issues, IC  Musculoskeletal:        FM syndrome  Skin:  Positive for rash.  Neurological:        Vestibular migraine recently  Endo/Heme/Allergies: Negative.   Psychiatric/Behavioral:         See HPI    Individual Medical History/ Review of Systems: Changes? :Yes   new dx of asthma, was dx with vestibular migraine scared her so much she stopped the Spravato  and decreased the Wellbutrin b/c afraid she was having a seizure, Topamax was increased, she stopped Lamictal b/c she thought it was causing the headache and dizziness,  back pain is worse and will start PT soon.  Past Psychiatric History:    She voluntarily committed herself in 2004 and 3 times since then. For depression. Never attempted suicide.    Past medications for mental health diagnoses include: Prozac didn't work, Effexor, Wellbutrin, Seroquel, Vraylar, Paxil, Zoloft, Cymbalta, Pristiq, Klonopin, Xanax, Trazodone, Ambien, Lunesta, Spravato started 10/2021. Lamictal didn't help but took it for a long time, Adderall   Never had ECT, TMS  Allergies: Ibuprofen, Neomycin-bacitracin-polymyxin  [bacitracin-neomycin-polymyxin], Quinolones, Atorvastatin, Bacitracin-polymyxin b, Benzalkonium chloride, Cefprozil, Cephalexin, Ciprofloxacin, Mederma, Monistat [miconazole], Neomycin-bacitracin zn-polymyx, Polyoxyethylene 40 sorbitol septaoleate [sorbitan], Prednisone, Quinine, Valtrex [valacyclovir hcl], and Adhesive [tape]  Current Medications:  Current Outpatient Medications:    albuterol (VENTOLIN HFA) 108 (90 Base) MCG/ACT inhaler, Inhale into the lungs., Disp: , Rfl:    bupivacaine (MARCAINE) 0.5 % injection, 15 mLs daily as needed. Pain in bladder, Disp: , Rfl:    buPROPion (WELLBUTRIN XL) 300 MG 24 hr tablet, Take 300 mg by mouth every morning. , Disp: , Rfl: 0   Cholecalciferol 100 MCG (4000 UT) CAPS, Take by mouth., Disp: , Rfl:    clobetasol ointment (TEMOVATE) 0.05 %, APPLY SMALL AMOUNT TOPICALLY TO THE AFFECTED AREA 2 TIMES A WEEK, Disp: 60 g, Rfl: 0  ELMIRON 100 MG capsule, Take 100 mg by mouth in the morning and at bedtime. Takes per bladder, Disp: , Rfl:    erythromycin ophthalmic ointment, SMARTSIG:In Eye(s), Disp: , Rfl:    estradiol (VIVELLE-DOT) 0.05 MG/24HR patch, Place onto the skin., Disp: , Rfl:    FLOVENT DISKUS 50 MCG/ACT AEPB, Inhale into the lungs., Disp: , Rfl:     hydrOXYzine (ATARAX/VISTARIL) 25 MG tablet, Take 50 mg by mouth at bedtime. , Disp: , Rfl:    meclizine (ANTIVERT) 25 MG tablet, Take 1 tablet (25 mg total) by mouth 3 (three) times daily as needed for dizziness., Disp: 30 tablet, Rfl: 0   methocarbamol (ROBAXIN) 500 MG tablet, Take 500 mg by mouth as needed., Disp: , Rfl:    methylphenidate (RITALIN) 10 MG tablet, Take 0.5-1 tablets (5-10 mg total) by mouth daily as needed., Disp: 30 tablet, Rfl: 0   nitrofurantoin (MACRODANTIN) 50 MG capsule, Take 50 mg by mouth daily., Disp: , Rfl:    NONFORMULARY OR COMPOUNDED ITEM, Gabapentin 6% cream in neutral base.  Apply to vulva 3 times a day as needed.  Dispense 30 grams, Disp: 30 each, Rfl: 1   pantoprazole (PROTONIX) 40 MG tablet, Take 40 mg by mouth daily., Disp: , Rfl:    polyethylene glycol (MIRALAX / GLYCOLAX) 17 g packet, Take 1 packet by mouth daily., Disp: , Rfl:    pregabalin (LYRICA) 150 MG capsule, TAKE 1 CAPSULE(150 MG) BY MOUTH TWICE DAILY, Disp: 60 capsule, Rfl: 3   progesterone (PROMETRIUM) 200 MG capsule, Take 1 capsule (200 mg total) by mouth at bedtime., Disp: 90 capsule, Rfl: 3   rosuvastatin (CRESTOR) 20 MG tablet, Take 1 tablet (20 mg total) by mouth daily., Disp: 30 tablet, Rfl: 11   topiramate (TOPAMAX) 25 MG tablet, Take 75 mg by mouth., Disp: , Rfl:    traZODone (DESYREL) 100 MG tablet, Take 1 tablet (100 mg total) by mouth at bedtime., Disp: 90 tablet, Rfl: 3   UNABLE TO FIND, Med Name: Valium Suppositories per pt, Disp: , Rfl:    clonazePAM (KLONOPIN) 1 MG tablet, TAKE 1 TABLET(1 MG) BY MOUTH THREE TIMES DAILY AS NEEDED FOR ANXIETY, must last 30 days., Disp: 90 tablet, Rfl: 2   doxycycline (VIBRA-TABS) 100 MG tablet, Take 1 tablet (100 mg total) by mouth 2 (two) times daily. (Patient not taking: Reported on 03/16/2022), Disp: 14 tablet, Rfl: 0   Meth-Hyo-M Bl-Na Phos-Ph Sal (URIBEL) 118 MG CAPS, Take by mouth as needed. (Patient not taking: Reported on 03/16/2022), Disp: , Rfl:     oxyCODONE (OXY IR/ROXICODONE) 5 MG immediate release tablet, Take by mouth. (Patient not taking: Reported on 03/16/2022), Disp: , Rfl:    sucralfate (CARAFATE) 1 g tablet, , Disp: , Rfl:  Medication Side Effects: none  Family Medical/ Social History: Changes?  See HPI  MENTAL HEALTH EXAM:  There were no vitals taken for this visit.There is no height or weight on file to calculate BMI.  General Appearance: Casual and Well Groomed  Eye Contact:  Good  Speech:  Clear and Coherent and Normal Rate  Volume:  Normal  Mood:   sad  Affect:  Congruent  Thought Process:  Goal Directed and Descriptions of Associations: Circumstantial  Orientation:  Full (Time, Place, and Person)  Thought Content: Logical   Suicidal Thoughts:  No  Homicidal Thoughts:  No  Memory:  WNL  Judgement:  Good  Insight:  Good  Psychomotor Activity:  Normal  Concentration:  Concentration: Good  Recall:  [place] Major of Knowledge: Good  Language: Good  Assets:  Desire for Improvement Financial Resources/Insurance Housing Transportation Vocational/Educational  ADL's:  Intact  Cognition: WNL  Prognosis:  Good   DIAGNOSES:    ICD-10-CM   1. Bipolar I disorder, most recent episode depressed (HCC)  F31.30     2. Malaise and fatigue  R53.81    R53.83     3. Generalized anxiety disorder  F41.1     4. Insomnia due to other mental disorder  F51.05    F99       Receiving Psychotherapy: Yes   with Dr. [name]  RECOMMENDATIONS:  PDMP reviewed.  Klonopin filled 03/07/2022. I provided 40 minutes of face to face time during this encounter, including time spent before and after the visit in records review, medical decision making, counseling pertinent to today's visit, and charting.   We discussed her lethargy.  I will await labs that were ordered by her rheumatologist.  She is on several sedating meds including hydroxyzine, Klonopin and trazodone.  She does not feel that she can go without those and that the  drowsiness is not related at all.  Recommend changing from an amphetamine to a methylphenidate.  She understands that any stimulant and a benzodiazepine is not something that I typically prescribed routinely so she should not mix them.  Discussed benefits, risk and side effects of Ritalin and she accepts. I do not think that the Wellbutrin dose or Lamictal would have been causing the vestibular migraine, but she feels more comfortable off Lamictal and with a lower dose of Wellbutrin so we will stay on that for now. No other changes will be made.  Cont Wellbutrin XL 300 mg daily. Continue Klonopin 1 mg, 1 po tid prn. Cont Hydroxyzine 25 mg, 2 po qhs.  Start Ritalin 10 mg, 1/2-1 p.o. every morning as needed.  Take sparingly. Continue Trazodone 100 mg, 1/2-1 qhs prn.  Continue therapy with Dr. [name]. Return in 4 weeks.   Melony Overly, PA-C

## 2022-03-21 LAB — ANCA SCREEN W REFLEX TITER: ANCA SCREEN: NEGATIVE

## 2022-03-21 LAB — COMPLETE METABOLIC PANEL WITH GFR
AG Ratio: 1.7 (calc) (ref 1.0–2.5)
ALT: 13 U/L (ref 6–29)
AST: 11 U/L (ref 10–35)
Albumin: 4.5 g/dL (ref 3.6–5.1)
Alkaline phosphatase (APISO): 77 U/L (ref 37–153)
BUN: 9 mg/dL (ref 7–25)
CO2: 26 mmol/L (ref 20–32)
Calcium: 9.8 mg/dL (ref 8.6–10.4)
Chloride: 106 mmol/L (ref 98–110)
Creat: 0.87 mg/dL (ref 0.50–1.03)
Globulin: 2.7 g/dL (calc) (ref 1.9–3.7)
Glucose, Bld: 90 mg/dL (ref 65–99)
Potassium: 4.3 mmol/L (ref 3.5–5.3)
Sodium: 140 mmol/L (ref 135–146)
Total Bilirubin: 0.3 mg/dL (ref 0.2–1.2)
Total Protein: 7.2 g/dL (ref 6.1–8.1)
eGFR: 79 mL/min/{1.73_m2} (ref >=60)

## 2022-03-21 LAB — CBC WITH DIFFERENTIAL/PLATELET
Absolute Monocytes: 486 cells/uL (ref 200–950)
Basophils Absolute: 68 cells/uL (ref 0–200)
Basophils Relative: 0.9 %
Eosinophils Absolute: 137 cells/uL (ref 15–500)
Eosinophils Relative: 1.8 %
HCT: 41.8 % (ref 35.0–45.0)
Hemoglobin: 14.2 g/dL (ref 11.7–15.5)
Lymphs Abs: 2569 cells/uL (ref 850–3900)
MCH: 30.5 pg (ref 27.0–33.0)
MCHC: 34 g/dL (ref 32.0–36.0)
MCV: 89.9 fL (ref 80.0–100.0)
MPV: 9.7 fL (ref 7.5–12.5)
Monocytes Relative: 6.4 %
Neutro Abs: 4340 cells/uL (ref 1500–7800)
Neutrophils Relative %: 57.1 %
Platelets: 265 10*3/uL (ref 140–400)
RBC: 4.65 10*6/uL (ref 3.80–5.10)
RDW: 12.6 % (ref 11.0–15.0)
Total Lymphocyte: 33.8 %
WBC: 7.6 10*3/uL (ref 3.8–10.8)

## 2022-03-21 LAB — C3 AND C4
C3 Complement: 177 mg/dL (ref 83–193)
C4 Complement: 49 mg/dL (ref 15–57)

## 2022-03-21 LAB — C-REACTIVE PROTEIN: CRP: 1.8 mg/L (ref <8.0)

## 2022-03-21 LAB — ANTI-DNA ANTIBODY, DOUBLE-STRANDED: ds DNA Ab: <1 IU/mL

## 2022-03-21 LAB — SEDIMENTATION RATE: Sed Rate: 11 mm/h (ref 0–30)

## 2022-03-21 LAB — SJOGRENS SYNDROME-A EXTRACTABLE NUCLEAR ANTIBODY: SSA (Ro) (ENA) Antibody, IgG: <1 AI

## 2022-03-21 LAB — SJOGRENS SYNDROME-B EXTRACTABLE NUCLEAR ANTIBODY: SSB (La) (ENA) Antibody, IgG: <1 AI

## 2022-03-23 ENCOUNTER — Ambulatory Visit (INDEPENDENT_AMBULATORY_CARE_PROVIDER_SITE_OTHER): Payer: BC Managed Care – PPO | Admitting: Psychiatry

## 2022-03-23 DIAGNOSIS — Z634 Disappearance and death of family member: Secondary | ICD-10-CM | POA: Diagnosis not present

## 2022-03-23 DIAGNOSIS — F5104 Psychophysiologic insomnia: Secondary | ICD-10-CM

## 2022-03-23 DIAGNOSIS — F4310 Post-traumatic stress disorder, unspecified: Secondary | ICD-10-CM | POA: Diagnosis not present

## 2022-03-23 DIAGNOSIS — Z638 Other specified problems related to primary support group: Secondary | ICD-10-CM

## 2022-03-23 DIAGNOSIS — F3130 Bipolar disorder, current episode depressed, mild or moderate severity, unspecified: Secondary | ICD-10-CM | POA: Diagnosis not present

## 2022-03-23 DIAGNOSIS — R6889 Other general symptoms and signs: Secondary | ICD-10-CM
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
## 2022-03-24 ENCOUNTER — Telehealth: Payer: Self-pay | Admitting: Internal Medicine

## 2022-03-24 NOTE — Telephone Encounter (Signed)
Pt suffers from Depression since 2004. Pt states she has had a really bad 2 yrs Gained a lot of weight, making her extremely depressed along with losses in the family. Pt starts physical therapy on 04/04/22. Pt calling to say she needs help getting the extra weight off. Pt would like to see MD to discuss medication for weight loss.  LOV:  04/28/21 = CPE  Pt would like to come in for a CPE, but I cannot seem to select a slot, as they are all blocked.  Can you please assist?

## 2022-03-25 ENCOUNTER — Encounter: Payer: Self-pay | Admitting: Internal Medicine

## 2022-03-25 NOTE — Telephone Encounter (Signed)
Called Pt to schedule, no answer. LVM for her to call back to schedule an OV.

## 2022-03-31 ENCOUNTER — Telehealth: Payer: Self-pay | Admitting: Physician Assistant

## 2022-03-31 NOTE — Telephone Encounter (Signed)
Are you able to rx Wellbutrin for her?

## 2022-03-31 NOTE — Telephone Encounter (Signed)
Erika Mccoy called this morning at 10:55 to request that Erika Mccoy start prescribing her Wellbutrin.  She takes 300mg  1/day.  She has run out of it.  Appt 04/15/22.  Send to 14/1/23 on [facility] in [place]

## 2022-04-01 ENCOUNTER — Other Ambulatory Visit: Payer: Self-pay | Admitting: Physician Assistant

## 2022-04-01 DIAGNOSIS — Z62810 Personal history of physical and sexual abuse in childhood: Secondary | ICD-10-CM | POA: Insufficient documentation

## 2022-04-01 DIAGNOSIS — N398 Other specified disorders of urinary system: Secondary | ICD-10-CM | POA: Insufficient documentation

## 2022-04-01 MED ORDER — BUPROPION HCL ER (XL) 300 MG PO TB24: 300.0000 mg | ORAL_TABLET | ORAL | 1 refills | Status: DC

## 2022-04-01 NOTE — Telephone Encounter (Signed)
Yes, please let her know I sent in the prescription just now.  Thank you.

## 2022-04-04 DIAGNOSIS — G2581 Restless legs syndrome: Secondary | ICD-10-CM | POA: Insufficient documentation

## 2022-04-04 DIAGNOSIS — A630 Anogenital (venereal) warts: Secondary | ICD-10-CM | POA: Insufficient documentation

## 2022-04-04 DIAGNOSIS — Z9152 Personal history of nonsuicidal self-harm: Secondary | ICD-10-CM | POA: Insufficient documentation

## 2022-04-04 DIAGNOSIS — B009 Herpesviral infection, unspecified: Secondary | ICD-10-CM | POA: Insufficient documentation

## 2022-04-05 ENCOUNTER — Encounter: Payer: Self-pay | Admitting: Pulmonary Disease

## 2022-04-05 ENCOUNTER — Emergency Department (HOSPITAL_COMMUNITY): Payer: BC Managed Care – PPO

## 2022-04-05 ENCOUNTER — Other Ambulatory Visit: Payer: Self-pay

## 2022-04-05 ENCOUNTER — Emergency Department (HOSPITAL_COMMUNITY)
Admission: EM | Admit: 2022-04-05 | Discharge: 2022-04-05 | Disposition: A | Discharge status: Home / Self Care | Payer: BC Managed Care – PPO | Attending: Emergency Medicine | Admitting: Emergency Medicine

## 2022-04-05 ENCOUNTER — Encounter (HOSPITAL_COMMUNITY): Payer: Self-pay

## 2022-04-05 ENCOUNTER — Ambulatory Visit: Payer: BC Managed Care – PPO

## 2022-04-05 ENCOUNTER — Ambulatory Visit: Payer: BC Managed Care – PPO | Admitting: Pulmonary Disease

## 2022-04-05 VITALS — BP 128/72 | HR 91 | Ht 64.75 in | Wt 160.0 lb

## 2022-04-05 DIAGNOSIS — R4182 Altered mental status, unspecified: Secondary | ICD-10-CM | POA: Diagnosis not present

## 2022-04-05 DIAGNOSIS — R519 Headache, unspecified: Secondary | ICD-10-CM | POA: Insufficient documentation

## 2022-04-05 DIAGNOSIS — R55 Syncope and collapse: Secondary | ICD-10-CM | POA: Insufficient documentation

## 2022-04-05 DIAGNOSIS — J40 Bronchitis, not specified as acute or chronic: Secondary | ICD-10-CM

## 2022-04-05 DIAGNOSIS — R0982 Postnasal drip: Secondary | ICD-10-CM | POA: Diagnosis not present

## 2022-04-05 DIAGNOSIS — F439 Reaction to severe stress, unspecified: Secondary | ICD-10-CM | POA: Diagnosis not present

## 2022-04-05 DIAGNOSIS — K219 Gastro-esophageal reflux disease without esophagitis: Secondary | ICD-10-CM | POA: Diagnosis not present

## 2022-04-05 DIAGNOSIS — R944 Abnormal results of kidney function studies: Secondary | ICD-10-CM | POA: Diagnosis not present

## 2022-04-05 DIAGNOSIS — R053 Chronic cough: Secondary | ICD-10-CM | POA: Diagnosis not present

## 2022-04-05 LAB — CBC
HCT: 38.3 % (ref 36.0–46.0)
Hemoglobin: 13 g/dL (ref 12.0–15.0)
MCH: 31 pg (ref 26.0–34.0)
MCHC: 33.9 g/dL (ref 30.0–36.0)
MCV: 91.2 fL (ref 80.0–100.0)
Platelets: 221 10*3/uL (ref 150–400)
RBC: 4.2 MIL/uL (ref 3.87–5.11)
RDW: 12.6 % (ref 11.5–15.5)
WBC: 6.9 10*3/uL (ref 4.0–10.5)
nRBC: 0 % (ref 0.0–0.2)

## 2022-04-05 LAB — BASIC METABOLIC PANEL
Anion gap: 8 (ref 5–15)
BUN: 21 mg/dL — ABNORMAL HIGH (ref 6–20)
CO2: 25 mmol/L (ref 22–32)
Calcium: 9 mg/dL (ref 8.9–10.3)
Chloride: 108 mmol/L (ref 98–111)
Creatinine, Ser: 0.71 mg/dL (ref 0.44–1.00)
GFR, Estimated: >60 mL/min (ref >60)
Glucose, Bld: 99 mg/dL (ref 70–99)
Potassium: 3.7 mmol/L (ref 3.5–5.1)
Sodium: 141 mmol/L (ref 135–145)

## 2022-04-05 LAB — ETHANOL: Alcohol, Ethyl (B): <10 mg/dL (ref <10)

## 2022-04-05 LAB — RAPID URINE DRUG SCREEN, HOSP PERFORMED
Amphetamines: POSITIVE — AB
Barbiturates: NOT DETECTED
Benzodiazepines: POSITIVE — AB
Cocaine: NOT DETECTED
Opiates: NOT DETECTED
Tetrahydrocannabinol: POSITIVE — AB

## 2022-04-05 MED ORDER — FLUTICASONE-SALMETEROL 115-21 MCG/ACT IN AERO: 2.0000 | INHALATION_SPRAY | Freq: Two times a day (BID) | RESPIRATORY_TRACT | 3 refills | Status: DC

## 2022-04-05 MED ORDER — SODIUM CHLORIDE 0.9 % IV BOLUS (SEPSIS)
1000.0000 mL | Freq: Once | INTRAVENOUS | Status: AC
  Administered 2022-04-05: 1000 mL via INTRAVENOUS

## 2022-04-05 MED ORDER — IPRATROPIUM BROMIDE 0.03 % NA SOLN: 2.0000 | Freq: Two times a day (BID) | NASAL | 12 refills | Status: DC

## 2022-04-05 MED ORDER — SODIUM CHLORIDE 0.9 % IV SOLN
1000.0000 mL | INTRAVENOUS | Status: DC
  Administered 2022-04-05: 1000 mL via INTRAVENOUS

## 2022-04-05 MED ORDER — AZITHROMYCIN 250 MG PO TABS: ORAL_TABLET | ORAL | 0 refills | Status: DC

## 2022-04-05 NOTE — ED Provider Notes (Signed)
[facility]-EMERGENCY DEPT Provider Note   [name]: [id number] Arrival date & time: 04/05/22  1949     History  Chief Complaint  Patient presents with   Altered Mental Status    Erika Mccoy is a 56 y.o. female.   Altered Mental Status    Patient has a history of interstitial cystitis fibromyalgia, anxiety,, PTSD who presents to the ED for evaluation after syncopal episode.  Patient states she has had a stressful day but was feeling better when she was at home.  She was with her husband.  They had eaten some dinner.  Patient did have 1 alcoholic beverage and some marijuana.  The marijuana was old and she had had it previously without any issue.  Husband states patient was talking on the phone when she suddenly started talking slowly and abnormally.  She then put her head down.  Patient started to pass out.  Husband was able to talk to her and she would look at him but she was not communicating.  She had a syncopal episode.  He called EMS.  Patient states she does not recall that episode.  She does remember waking up with the paramedics around her.  Husband reports no seizure activity.  Patient states she has been having some issues with headaches.  She also feels like her cognition has diminished over several months.  She feels like she has more difficulty reading.  Patient states she was diagnosed with vestibular migraines but does not agree with that diagnosis.  Home Medications Prior to Admission medications   Medication Sig Start Date End Date Taking? Authorizing Provider  albuterol (VENTOLIN HFA) 108 (90 Base) MCG/ACT inhaler Inhale into the lungs. 03/10/22   [provider]  azithromycin (ZITHROMAX) 250 MG tablet Take as directed 04/05/22   Martina Sinner, MD  bupivacaine (MARCAINE) 0.5 % injection 15 mLs daily as needed. Pain in bladder 08/31/18   [provider]  buPROPion (WELLBUTRIN XL) 300 MG 24 hr tablet Take 1 tablet (300 mg  total) by mouth every morning. 04/01/22   Cherie Ouch, PA-C  Cholecalciferol 100 MCG (4000 UT) CAPS Take by mouth.    [provider]  clobetasol ointment (TEMOVATE) 0.05 % APPLY SMALL AMOUNT TOPICALLY TO THE AFFECTED AREA 2 TIMES A WEEK 06/26/21   Amundson Illene Regulus, Brook E, MD  clonazePAM (KLONOPIN) 1 MG tablet TAKE 1 TABLET(1 MG) BY MOUTH THREE TIMES DAILY AS NEEDED FOR ANXIETY, must last 30 days. 03/16/22   Melony Overly T, PA-C  ELMIRON 100 MG capsule Take 100 mg by mouth in the morning and at bedtime. Takes per bladder 07/31/19   [provider]  erythromycin ophthalmic ointment SMARTSIG:In Eye(s) 03/10/22   [provider]  estradiol (VIVELLE-DOT) 0.05 MG/24HR patch Place onto the skin. 05/20/21   [provider]  fluticasone-salmeterol (ADVAIR HFA) 115-21 MCG/ACT inhaler Inhale 2 puffs into the lungs 2 (two) times daily. 04/05/22   Martina Sinner, MD  hydrOXYzine (ATARAX/VISTARIL) 25 MG tablet Take 50 mg by mouth at bedtime.  08/31/18   [provider]  ipratropium (ATROVENT) 0.03 % nasal spray Place 2 sprays into both nostrils every 12 (twelve) hours. 04/05/22   Martina Sinner, MD  meclizine (ANTIVERT) 25 MG tablet Take 1 tablet (25 mg total) by mouth 3 (three) times daily as needed for dizziness. 01/13/22   Piontek, Denny Peon, MD  methocarbamol (ROBAXIN) 500 MG tablet Take 500 mg by mouth as needed. 07/22/21   [provider]  methylphenidate (RITALIN) 10 MG tablet Take 0.5-1 tablets (5-10 mg total) by mouth daily as needed. 03/16/22   Cherie Ouch, PA-C  nitrofurantoin (MACRODANTIN) 50 MG capsule Take 50 mg by mouth daily.    [provider]  NONFORMULARY OR COMPOUNDED ITEM Gabapentin 6% cream in neutral base.  Apply to vulva 3 times a day as needed.  Dispense 30 grams 02/11/22   Ardell Isaacs, Forrestine Him, MD  pantoprazole (PROTONIX) 40 MG tablet Take 40 mg by mouth daily. 12/05/20   [provider]  polyethylene glycol  (MIRALAX / GLYCOLAX) 17 g packet Take 1 packet by mouth daily. 02/21/22   [provider]  pregabalin (LYRICA) 150 MG capsule TAKE 1 CAPSULE(150 MG) BY MOUTH TWICE DAILY 03/16/22   Rice, Jamesetta Orleans, MD  progesterone (PROMETRIUM) 200 MG capsule Take 1 capsule (200 mg total) by mouth at bedtime. 07/02/21   Patton Salles, MD  rosuvastatin (CRESTOR) 20 MG tablet Take 1 tablet (20 mg total) by mouth daily. 06/09/21   Kathleene Hazel, MD  topiramate (TOPAMAX) 25 MG tablet Take 75 mg by mouth. 01/19/22 01/19/23  [provider]  traZODone (DESYREL) 100 MG tablet Take 1 tablet (100 mg total) by mouth at bedtime. 01/07/22   Cherie Ouch, PA-C  UNABLE TO FIND Med Name: Valium Suppositories per pt    [provider]      Allergies    Ibuprofen, Neomycin-bacitracin-polymyxin  [bacitracin-neomycin-polymyxin], Quinolones, Atorvastatin, Bacitracin-polymyxin b, Benzalkonium chloride, Cefprozil, Cephalexin, Ciprofloxacin, Mederma, Monistat [miconazole], Neomycin-bacitracin zn-polymyx, Polyoxyethylene 40 sorbitol septaoleate [sorbitan], Prednisone, Quinine, Valtrex [valacyclovir hcl], and Adhesive [tape]    Review of Systems   Review of Systems  Physical Exam Updated Vital Signs BP 123/86   Pulse 72   Temp 98.3 F (36.8 C) (Oral)   Resp 19   Ht 1.651 m (5\' 5" )   Wt 72.6 kg   SpO2 100%   BMI 26.63 kg/m  Physical Exam Vitals and nursing note reviewed.  Constitutional:      General: She is not in acute distress.    Appearance: She is well-developed.  HENT:     Head: Normocephalic and atraumatic.     Right Ear: External ear normal.     Left Ear: External ear normal.  Eyes:     General: No scleral icterus.       Right eye: No discharge.        Left eye: No discharge.     Conjunctiva/sclera: Conjunctivae normal.  Neck:     Trachea: No tracheal deviation.  Cardiovascular:     Rate and Rhythm: Normal rate and regular rhythm.  Pulmonary:     Effort:  Pulmonary effort is normal. No respiratory distress.     Breath sounds: Normal breath sounds. No stridor. No wheezing or rales.  Abdominal:     General: Bowel sounds are normal. There is no distension.     Palpations: Abdomen is soft.     Tenderness: There is no abdominal tenderness. There is no guarding or rebound.  Musculoskeletal:        General: No tenderness or deformity.     Cervical back: Neck supple.  Skin:    General: Skin is warm and dry.     Findings: No rash.  Neurological:     General: No focal deficit present.     Mental Status: She is alert. Mental status is at baseline.     Cranial Nerves: No cranial nerve deficit (  no facial droop, extraocular movements intact, no slurred speech).     Sensory: No sensory deficit.     Motor: No weakness, abnormal muscle tone or seizure activity.     Coordination: Coordination normal.     Comments: Patient able to stand without difficulty  Psychiatric:        Mood and Affect: Mood normal.     ED Results / Procedures / Treatments   Labs (all labs ordered are listed, but only abnormal results are displayed) Labs Reviewed  BASIC METABOLIC PANEL - Abnormal; Notable for the following components:      Result Value   BUN 21 (*)    All other components within normal limits  RAPID URINE DRUG SCREEN, HOSP PERFORMED - Abnormal; Notable for the following components:   Benzodiazepines POSITIVE (*)    Amphetamines POSITIVE (*)    Tetrahydrocannabinol POSITIVE (*)    All other components within normal limits  CBC  ETHANOL    EKG EKG Interpretation  Date/Time:  Tuesday April 05 2022 20:53:42 EST Ventricular Rate:  92 PR Interval:  143 QRS Duration: 85 QT Interval:  366 QTC Calculation: 453 R Axis:   78 Text Interpretation: Sinus rhythm No significant change since last tracing Confirmed by Linwood Dibbles [phone]) on 04/05/2022 8:58:16 PM  Radiology CT Head Wo Contrast  Result Date: 04/05/2022 CLINICAL DATA:  Mental status change,  unknown cause. EXAM: CT HEAD WITHOUT CONTRAST TECHNIQUE: Contiguous axial images were obtained from the base of the skull through the vertex without intravenous contrast. RADIATION DOSE REDUCTION: This exam was performed according to the departmental dose-optimization program which includes automated exposure control, adjustment of the mA and/or kV according to patient size and/or use of iterative reconstruction technique. COMPARISON:  None Available. FINDINGS: Brain: No acute intracranial hemorrhage, midline shift or mass effect. No extra-axial fluid collection. Gray-white matter differentiation is within normal limits. No hydrocephalus. Vascular: No hyperdense vessel or unexpected calcification. Skull: Normal. Negative for fracture or focal lesion. Sinuses/Orbits: No acute finding. Other: None. IMPRESSION: No acute intracranial process. Electronically Signed   By: Thornell Sartorius M.D.   On: 04/05/2022 21:27    Procedures Procedures    Medications Ordered in ED Medications  sodium chloride 0.9 % bolus 1,000 mL (1,000 mLs Intravenous New Bag/Given 04/05/22 2046)    Followed by  0.9 %  sodium chloride infusion (1,000 mLs Intravenous New Bag/Given 04/05/22 2051)    ED Course/ Medical Decision Making/ A&P Clinical Course as of 04/05/22 2335  Tue Apr 05, 2022  2147 Head CT without acute findings [JK]  2331 CBC unremarkable.  Metabolic panel does show elevated BUN.  Ethanol  negative. [JK]  2332 UDS positive for benzos amphetamines THC.  Correlates with the patient's prescriptions recreational marijuana use [JK]    Clinical Course User Index [JK] Linwood Dibbles, MD                           Medical Decision Making Differential diagnosis includes but not limited to seizure, syncopal dehydration, cerebral hemorrhage  Problems Addressed: Syncope, unspecified syncope type: acute illness or injury that poses a threat to life or bodily functions  Amount and/or Complexity of Data Reviewed Labs: ordered.  Decision-making details documented in ED Course. Radiology: ordered and independent interpretation performed.  Risk Prescription drug management.   Since ED work-up is reassuring.  No signs of anemia.  Vital signs are unremarkable.  No recurrent syncope and no arrhythmia noted on her  cardiac monitoring.  Patient has been able to ambulate without difficulty.  BUN is slightly elevated so there may have been a component of dehydration and orthostatic hypotension.  She appears stable for discharge and close outpatient follow-up I do think she would benefit from cardiology referral and possible outpatient cardiac monitoring.        Final Clinical Impression(s) / ED Diagnoses Final diagnoses:  Syncope, unspecified syncope type    Rx / DC Orders ED Discharge Orders          Ordered    Ambulatory referral to Cardiology       Comments: If you have not heard from the Cardiology office within the next 72 hours please call [phone].   04/05/22 7680              Linwood Dibbles, MD 04/05/22 [phone]
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
## 2022-04-05 NOTE — Patient Instructions (Signed)
Your cough appears related to post-nasal drainage and possible bronchitis.  Start ipratropium nasal spray, 2 sprays per nostril twice daily  Start Zpak for 5 days  Start advair inhaler 2 puffs twice daily - rinse mouth out after each use  Continue pantoprazole 40mg  daily, sleeping with head of bed elevated and eating at least 2 hours prior to bed time for reflux disease.   Follow up in 2 months with pulmonary function tests
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
## 2022-04-05 NOTE — Discharge Instructions (Signed)
Make sure to stay well-hydrated.  Follow-up with a cardiologist for further evaluation.  Return to the ED for recurrent symptoms

## 2022-04-06 ENCOUNTER — Ambulatory Visit (INDEPENDENT_AMBULATORY_CARE_PROVIDER_SITE_OTHER): Payer: BC Managed Care – PPO | Admitting: Psychiatry

## 2022-04-06 ENCOUNTER — Telehealth: Payer: Self-pay

## 2022-04-06 DIAGNOSIS — F5104 Psychophysiologic insomnia: Secondary | ICD-10-CM

## 2022-04-06 DIAGNOSIS — F3130 Bipolar disorder, current episode depressed, mild or moderate severity, unspecified: Secondary | ICD-10-CM

## 2022-04-06 DIAGNOSIS — F4310 Post-traumatic stress disorder, unspecified: Secondary | ICD-10-CM

## 2022-04-06 DIAGNOSIS — R6889 Other general symptoms and signs: Secondary | ICD-10-CM

## 2022-04-06 DIAGNOSIS — Z634 Disappearance and death of family member: Secondary | ICD-10-CM | POA: Diagnosis not present

## 2022-04-06 DIAGNOSIS — N3010 Interstitial cystitis (chronic) without hematuria: Secondary | ICD-10-CM

## 2022-04-06 NOTE — Telephone Encounter (Signed)
Transition Care Management Follow-up Telephone Call Date of discharge and from where: 04/05/22 from [facility] ED from Syncope and collapse How have you been since you were released from the hospital? Pt states she feeling better.  Any questions or concerns? No  Items Reviewed: Did the pt receive and understand the discharge instructions provided? Yes  Medications obtained and verified?  N/a Other? Just doesn't know what happened.   Follow up appointments reviewed:  PCP Hospital f/u appt confirmed? Yes  Scheduled to see Dr Ardyth Harps on 04/11/22 @ 2pm.. Specialist Hospital f/u appt confirmed? No  Cardiology referral was placed in ED, however pt has cardiologist Clifton James). Pt will call cardiologist to schedule appt. Are transportation arrangements needed? No  If their condition worsens, is the pt aware to call PCP or go to the Emergency Dept.? Yes Was the patient provided with contact information for the PCP's office or ED? Yes Was to pt encouraged to call back with questions or concerns? Yes
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
## 2022-04-11 ENCOUNTER — Telehealth: Payer: Self-pay | Admitting: Physician Assistant

## 2022-04-11 ENCOUNTER — Ambulatory Visit (INDEPENDENT_AMBULATORY_CARE_PROVIDER_SITE_OTHER): Payer: BC Managed Care – PPO | Admitting: Internal Medicine

## 2022-04-11 VITALS — BP 120/88 | HR 85 | Temp 97.9°F | Wt 160.0 lb

## 2022-04-11 DIAGNOSIS — Z09 Encounter for follow-up examination after completed treatment for conditions other than malignant neoplasm: Secondary | ICD-10-CM | POA: Diagnosis not present

## 2022-04-11 DIAGNOSIS — N3010 Interstitial cystitis (chronic) without hematuria: Secondary | ICD-10-CM

## 2022-04-11 DIAGNOSIS — F319 Bipolar disorder, unspecified: Secondary | ICD-10-CM | POA: Diagnosis not present

## 2022-04-11 DIAGNOSIS — M797 Fibromyalgia: Secondary | ICD-10-CM

## 2022-04-11 NOTE — Telephone Encounter (Signed)
Pt lvm that the medication is not  working. She said I can't do this anymore. She is is crying all the time and in the bed. She went to her PCP and cried through the whole appt. She needs something different. Please give her a call at [phone]

## 2022-04-11 NOTE — Telephone Encounter (Signed)
Spoke to pt on a 25 min call.Recently she passed out and was in the hospital.She now has to see a cardiologist and pulmonologist.She is dealing with so many stressors right now and having a hard time coping.She stated so many people are dying around her and most recently her husband overdosed.She has taken down all the pictures of her dead relatives because "she is tired of seeing dead people."She is also dealing with chronic pain and does not take pain meds.She stated Wellbutrin is not helpful at all and kept stressing it's not an anti depressant.She cried the entire phone call but I was able to provide her empathy and calm her down which she was grateful for.Her biggest concern is that depression needs to be addressed and Wellbutrin is not her solution.
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
## 2022-04-12 ENCOUNTER — Encounter: Payer: Self-pay | Admitting: Cardiovascular Disease

## 2022-04-12 ENCOUNTER — Ambulatory Visit (INDEPENDENT_AMBULATORY_CARE_PROVIDER_SITE_OTHER): Payer: BC Managed Care – PPO

## 2022-04-12 ENCOUNTER — Ambulatory Visit: Payer: BC Managed Care – PPO | Attending: Cardiovascular Disease | Admitting: Cardiovascular Disease

## 2022-04-12 VITALS — BP 108/70 | HR 89 | Ht 65.0 in | Wt 162.4 lb

## 2022-04-12 DIAGNOSIS — I2510 Atherosclerotic heart disease of native coronary artery without angina pectoris: Secondary | ICD-10-CM

## 2022-04-12 DIAGNOSIS — R55 Syncope and collapse: Secondary | ICD-10-CM

## 2022-04-12 NOTE — Telephone Encounter (Signed)
Before changing or adding any medication I recommend increasing the Wellbutrin to a total of 450 mg which is the maximum dose.  Please assure her that it is an antidepressant and also helps with energy and motivation which I feel that she needs help with.  If that is not effective, then I recommend adding either Rexulti or Latuda.  If she is not using a mood therapy lamp I recommend she get 1 of those, they have them on [employer] for anywhere from $30 to 100+ dollars.  She needs to sit in front of that for about 20 or 30 minutes every day, to simulate sunlight.  If she is not taking a multivitamin, vitamin D 2000 IUs daily, and B complex, she needs to add those in.  They also help with depression.  And I recommend she restart Spravato. Thank you.

## 2022-04-12 NOTE — Progress Notes (Unsigned)
Enrolled for [employer] to mail a ZIO XT long term holter monitor to the patients address on file.  

## 2022-04-12 NOTE — Telephone Encounter (Signed)
Pt informed

## 2022-04-12 NOTE — Patient Instructions (Signed)
Medication Instructions:  No changes *If you need a refill on your cardiac medications before your next appointment, please call your pharmacy*   Lab Work: none   Testing/Procedures: 3 day zio monitor - see instructions below   Follow-Up: At [facility], you and your health needs are our priority.  As part of our continuing mission to provide you with exceptional heart care, we have created designated Provider Care Teams.  These Care Teams include your primary Cardiologist (physician) and Advanced Practice Providers (APPs -  Physician Assistants and Nurse Practitioners) who all work together to provide you with the care you need, when you need it.    Your next appointment:   12 month(s)  The format for your next appointment:   In Person  Provider:   Verne Carrow, MD     Other Instructions ZIO XT- Long Term Monitor Instructions  Your physician has requested you wear a ZIO patch monitor for 3 days.  This is a single patch monitor. [employer] supplies one patch monitor per enrollment. Additional stickers are not available. Please do not apply patch if you will be having a Nuclear Stress Test,  Echocardiogram, Cardiac CT, MRI, or Chest Xray during the period you would be wearing the  monitor. The patch cannot be worn during these tests. You cannot remove and re-apply the  ZIO XT patch monitor.  Your ZIO patch monitor will be mailed 3 day USPS to your address on file. It may take 3-5 days  to receive your monitor after you have been enrolled.  Once you have received your monitor, please review the enclosed instructions. Your monitor  has already been registered assigning a specific monitor serial # to you.  Billing and Patient Assistance Program Information  We have supplied [employer] with any of your insurance information on file for billing purposes. [employer] offers a sliding scale Patient Assistance Program for patients that do not have  insurance, or whose  insurance does not completely cover the cost of the ZIO monitor.  You must apply for the Patient Assistance Program to qualify for this discounted rate.  To apply, please call [employer] at [phone], select option 4, select option 2, ask to apply for  Patient Assistance Program. Erika Mccoy will ask your household income, and how many people  are in your household. They will quote your out-of-pocket cost based on that information.  [employer] will also be able to set up a 38-month, interest-free payment plan if needed.  Applying the monitor   Shave hair from upper left chest.  Hold abrader disc by orange tab. Rub abrader in 40 strokes over the upper left chest as  indicated in your monitor instructions.  Clean area with 4 enclosed alcohol pads. Let dry.  Apply patch as indicated in monitor instructions. Patch will be placed under collarbone on left  side of chest with arrow pointing upward.  Rub patch adhesive wings for 2 minutes. Remove white label marked "1". Remove the white  label marked "2". Rub patch adhesive wings for 2 additional minutes.  While looking in a mirror, press and release button in center of patch. A small green light will  flash 3-4 times. This will be your only indicator that the monitor has been turned on.  Do not shower for the first 24 hours. You may shower after the first 24 hours.  Press the button if you feel a symptom. You will hear a small click. Record Date, Time and  Symptom in the Patient Logbook.  When you are ready to remove the patch, follow instructions on the last 2 pages of Patient  Logbook. Stick patch monitor onto the last page of Patient Logbook.  Place Patient Logbook in the blue and white box. Use locking tab on box and tape box closed  securely. The blue and white box has prepaid postage on it. Please place it in the mailbox as  soon as possible. Your physician should have your test results approximately 7 days after the  monitor has been mailed back  to [facility].  Call [facility] Customer Care at [phone] if you have questions regarding  your ZIO XT patch monitor. Call them immediately if you see an orange light blinking on your  monitor.  If your monitor falls off in less than 4 days, contact our Monitor department at [phone].  If your monitor becomes loose or falls off after 4 days call [employer] at [phone] for  suggestions on securing your monitor

## 2022-04-12 NOTE — Telephone Encounter (Signed)
LVM to rtc 

## 2022-04-15 ENCOUNTER — Ambulatory Visit (INDEPENDENT_AMBULATORY_CARE_PROVIDER_SITE_OTHER): Payer: BC Managed Care – PPO | Admitting: Physician Assistant

## 2022-04-15 ENCOUNTER — Encounter: Payer: Self-pay | Admitting: Physician Assistant

## 2022-04-15 DIAGNOSIS — R5383 Other fatigue: Secondary | ICD-10-CM

## 2022-04-15 DIAGNOSIS — F5105 Insomnia due to other mental disorder: Secondary | ICD-10-CM

## 2022-04-15 DIAGNOSIS — F3130 Bipolar disorder, current episode depressed, mild or moderate severity, unspecified: Secondary | ICD-10-CM

## 2022-04-15 DIAGNOSIS — F4310 Post-traumatic stress disorder, unspecified: Secondary | ICD-10-CM | POA: Diagnosis not present

## 2022-04-15 DIAGNOSIS — R5381 Other malaise: Secondary | ICD-10-CM

## 2022-04-15 DIAGNOSIS — F411 Generalized anxiety disorder: Secondary | ICD-10-CM | POA: Diagnosis not present

## 2022-04-15 DIAGNOSIS — F99 Mental disorder, not otherwise specified: Secondary | ICD-10-CM

## 2022-04-15 NOTE — Progress Notes (Signed)
[facility]  Patient ID: Erika Mccoy,  MRN: [id number]  PCP: Philip Aspen, Limmie Patricia, MD  Date of Evaluation: 04/15/2022 Time spent:20 minutes  Chief Complaint:  Chief Complaint   Anxiety; Depression; ADD; Follow-up    HISTORY/CURRENT STATUS: HPI  for routine med check.  Ritalin didn't help at all, she went back to prn Adderall and it's helping. She had left over pills.  She does not take it often but when she has a long day where she will need to stay "on" for a while then she takes it and it is beneficial.  She feels like she is in a fog.  Thinks it may be related to Topamax.  She went off of Lyrica because she was crying all the time.  That stopped when she stopped the Lyrica.  But she also increased the Wellbutrin to 450 mg around that time, not exactly sure when.  She gets overwhelmed with all the stress and loss she has had in the past year.  Klonopin helps when needed but she usually only takes it at night.  States she quit drinking completely.  She feels tired all the time.  Has a hard time enjoying things, just goes through the motions.  Her husband's mother died recently and the memorial services this weekend.  That is very sad.  Sleeps well most of the time, uses the Klonopin in the evenings, also trazodone as needed.  ADLs and personal hygiene are normal.  Weight is stable. Denies suicidal or homicidal thoughts.  Patient denies increased energy with decreased need for sleep, increased talkativeness, racing thoughts, impulsivity or risky behaviors, increased spending, increased libido, grandiosity, increased irritability or anger, paranoia, or hallucinations.  Denies dizziness, syncope, seizures, numbness, tingling, tremor, tics, unsteady gait, slurred speech, confusion. No dystonia.  Individual Medical History/ Review of Systems: Changes? :Yes    f/u with cardiologist, see notes on chart.   Past Psychiatric History:    She voluntarily committed  herself in 2004 and 3 times since then. For depression. Never attempted suicide.    Past medications for mental health diagnoses include: Prozac didn't work, Effexor, Wellbutrin, Seroquel, Vraylar, Paxil, Zoloft, Cymbalta, Pristiq, Klonopin, Xanax, Trazodone, Ambien, Lunesta, Spravato started 10/2021. Lamictal didn't help but took it for a long time, Adderall, Lyrica caused crying   Never had ECT, TMS  Allergies: Ibuprofen, Neomycin-bacitracin-polymyxin  [bacitracin-neomycin-polymyxin], Quinolones, Atorvastatin, Bacitracin-polymyxin b, Benzalkonium chloride, Cefprozil, Cephalexin, Ciprofloxacin, Mederma, Monistat [miconazole], Neomycin-bacitracin zn-polymyx, Polyoxyethylene 40 sorbitol septaoleate [sorbitan], Prednisone, Quinine, Valtrex [valacyclovir hcl], and Adhesive [tape]  Current Medications:  Current Outpatient Medications:    bupivacaine (MARCAINE) 0.5 % injection, 15 mLs daily as needed. Pain in bladder, Disp: , Rfl:    buPROPion (WELLBUTRIN XL) 150 MG 24 hr tablet, Take 150 mg by mouth daily. Takes w/ 300 mg daily, Disp: , Rfl:    buPROPion (WELLBUTRIN XL) 300 MG 24 hr tablet, Take 1 tablet (300 mg total) by mouth every morning., Disp: 90 tablet, Rfl: 1   Cholecalciferol 100 MCG (4000 UT) CAPS, Take by mouth., Disp: , Rfl:    clobetasol ointment (TEMOVATE) 0.05 %, APPLY SMALL AMOUNT TOPICALLY TO THE AFFECTED AREA 2 TIMES A WEEK, Disp: 60 g, Rfl: 0   clonazePAM (KLONOPIN) 1 MG tablet, TAKE 1 TABLET(1 MG) BY MOUTH THREE TIMES DAILY AS NEEDED FOR ANXIETY, must last 30 days., Disp: 90 tablet, Rfl: 2   ELMIRON 100 MG capsule, Take 100 mg by mouth in the morning and at bedtime. Takes per bladder, Disp: ,  Rfl:    estradiol (VIVELLE-DOT) 0.05 MG/24HR patch, Place onto the skin., Disp: , Rfl:    fluticasone-salmeterol (ADVAIR HFA) 115-21 MCG/ACT inhaler, Inhale 2 puffs into the lungs 2 (two) times daily., Disp: 1 each, Rfl: 3   hydrOXYzine (ATARAX/VISTARIL) 25 MG tablet, Take 50 mg by mouth at  bedtime. , Disp: , Rfl:    methocarbamol (ROBAXIN) 500 MG tablet, Take 500 mg by mouth as needed., Disp: , Rfl:    nitrofurantoin (MACRODANTIN) 50 MG capsule, Take 50 mg by mouth daily., Disp: , Rfl:    pantoprazole (PROTONIX) 40 MG tablet, Take 40 mg by mouth daily., Disp: , Rfl:    progesterone (PROMETRIUM) 200 MG capsule, Take 1 capsule (200 mg total) by mouth at bedtime., Disp: 90 capsule, Rfl: 3   rosuvastatin (CRESTOR) 20 MG tablet, Take 1 tablet (20 mg total) by mouth daily., Disp: 30 tablet, Rfl: 11   topiramate (TOPAMAX) 25 MG tablet, Take 75 mg by mouth., Disp: , Rfl:    traZODone (DESYREL) 100 MG tablet, Take 1 tablet (100 mg total) by mouth at bedtime., Disp: 90 tablet, Rfl: 3   UNABLE TO FIND, Med Name: Valium Suppositories per pt, Disp: , Rfl:    erythromycin ophthalmic ointment, SMARTSIG:In Eye(s) (Patient not taking: Reported on 04/15/2022), Disp: , Rfl:    ipratropium (ATROVENT) 0.03 % nasal spray, Place 2 sprays into both nostrils every 12 (twelve) hours. (Patient not taking: Reported on 04/15/2022), Disp: 30 mL, Rfl: 12   NONFORMULARY OR COMPOUNDED ITEM, Gabapentin 6% cream in neutral base.  Apply to vulva 3 times a day as needed.  Dispense 30 grams (Patient not taking: Reported on 04/15/2022), Disp: 30 each, Rfl: 1   pregabalin (LYRICA) 150 MG capsule, TAKE 1 CAPSULE(150 MG) BY MOUTH TWICE DAILY (Patient not taking: Reported on 04/15/2022), Disp: 60 capsule, Rfl: 3 Medication Side Effects: none  Family Medical/ Social History: Changes?  See HPI  MENTAL HEALTH EXAM:  There were no vitals taken for this visit.There is no height or weight on file to calculate BMI.  General Appearance: Casual and Well Groomed  Eye Contact:  Good  Speech:  Clear and Coherent and Normal Rate  Volume:  Normal  Mood:   sad  Affect:  Congruent  Thought Process:  Goal Directed and Descriptions of Associations: Circumstantial  Orientation:  Full (Time, Place, and Person)  Thought Content: Logical    Suicidal Thoughts:  No  Homicidal Thoughts:  No  Memory:  WNL  Judgement:  Good  Insight:  Good  Psychomotor Activity:  Normal  Concentration:  Concentration: Good  Recall:  Good  Fund of Knowledge: Good  Language: Good  Assets:  Desire for Improvement Financial Resources/Insurance Housing Transportation  ADL's:  Intact  Cognition: WNL  Prognosis:  Good   DIAGNOSES:    ICD-10-CM   1. PTSD (post-traumatic stress disorder)  F43.10     2. Bipolar I disorder, most recent episode depressed (HCC)  F31.30     3. Generalized anxiety disorder  F41.1     4. Malaise and fatigue  R53.81    R53.83     5. Insomnia due to other mental disorder  F51.05    F99       Receiving Psychotherapy: Yes   with Dr. [name]  RECOMMENDATIONS:  PDMP reviewed.  Klonopin filled Klonopin filled 04/08/2022.  Ritalin filled 03/16/2022.  Lyrica filled 03/16/2022. I provided 20 minutes of face to face time during this encounter, including time spent before and  after the visit in records review, medical decision making, counseling pertinent to today's visit, and charting.   We discussed the Spravato again.  Her insurance will not pay for it so she is unable to get it.  It did seem to help some after the few times that she took it.  She prefers not to make any changes at this time.  She has not been back on the higher dose of Wellbutrin long enough to know how well that is helping so hopefully in the next few weeks to a month she will see some improvement in mood and energy/motivation.  Continue Adderall XR 30 mg, 1 p.o. daily as needed.  Do not mix with the Klonopin. Cont Wellbutrin XL 450 mg daily. Continue Klonopin 1 mg, 1 po tid prn.  (She reports taking only at night.) Cont Hydroxyzine 25 mg, 2 po qhs.  Continue Trazodone 100 mg, 1/2-1 qhs prn.  Continue therapy with Dr. [name]. Return in 4-6 weeks.   Melony Overly, PA-C

## 2022-04-17 DIAGNOSIS — R55 Syncope and collapse: Secondary | ICD-10-CM | POA: Diagnosis not present

## 2022-04-20 ENCOUNTER — Encounter: Payer: Self-pay | Admitting: Pulmonary Disease

## 2022-04-21 ENCOUNTER — Ambulatory Visit (INDEPENDENT_AMBULATORY_CARE_PROVIDER_SITE_OTHER): Payer: BC Managed Care – PPO | Admitting: Psychiatry

## 2022-04-21 DIAGNOSIS — F4310 Post-traumatic stress disorder, unspecified: Secondary | ICD-10-CM

## 2022-04-21 DIAGNOSIS — F4010 Social phobia, unspecified: Secondary | ICD-10-CM

## 2022-04-21 DIAGNOSIS — Z634 Disappearance and death of family member: Secondary | ICD-10-CM | POA: Diagnosis not present

## 2022-04-21 DIAGNOSIS — F5104 Psychophysiologic insomnia: Secondary | ICD-10-CM

## 2022-04-21 DIAGNOSIS — R6889 Other general symptoms and signs: Secondary | ICD-10-CM

## 2022-04-21 DIAGNOSIS — F1010 Alcohol abuse, uncomplicated: Secondary | ICD-10-CM

## 2022-04-21 DIAGNOSIS — F3130 Bipolar disorder, current episode depressed, mild or moderate severity, unspecified: Secondary | ICD-10-CM

## 2022-04-21 DIAGNOSIS — Z638 Other specified problems related to primary support group: Secondary | ICD-10-CM

## 2022-04-21 DIAGNOSIS — F411 Generalized anxiety disorder: Secondary | ICD-10-CM

## 2022-04-21 DIAGNOSIS — N3010 Interstitial cystitis (chronic) without hematuria: Secondary | ICD-10-CM

## 2022-04-21 NOTE — Telephone Encounter (Signed)
Dr. Francine Graven,  Please see message regarding Ipratropium nasal spray and advise.
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
## 2022-05-04 ENCOUNTER — Encounter: Payer: Self-pay | Admitting: Internal Medicine

## 2022-05-04 ENCOUNTER — Ambulatory Visit (INDEPENDENT_AMBULATORY_CARE_PROVIDER_SITE_OTHER): Payer: BC Managed Care – PPO | Admitting: Internal Medicine

## 2022-05-04 VITALS — BP 110/70 | HR 90 | Temp 97.6°F | Ht 65.25 in | Wt 159.1 lb

## 2022-05-04 DIAGNOSIS — J4530 Mild persistent asthma, uncomplicated: Secondary | ICD-10-CM

## 2022-05-04 DIAGNOSIS — R300 Dysuria: Secondary | ICD-10-CM | POA: Diagnosis not present

## 2022-05-04 DIAGNOSIS — N3010 Interstitial cystitis (chronic) without hematuria: Secondary | ICD-10-CM

## 2022-05-04 DIAGNOSIS — F319 Bipolar disorder, unspecified: Secondary | ICD-10-CM | POA: Diagnosis not present

## 2022-05-04 DIAGNOSIS — Z0000 Encounter for general adult medical examination without abnormal findings: Secondary | ICD-10-CM

## 2022-05-04 DIAGNOSIS — E782 Mixed hyperlipidemia: Secondary | ICD-10-CM | POA: Diagnosis not present

## 2022-05-04 LAB — POCT URINALYSIS DIPSTICK
Bilirubin, UA: POSITIVE
Blood, UA: NEGATIVE
Glucose, UA: NEGATIVE
Ketones, UA: NEGATIVE
Leukocytes, UA: NEGATIVE
Nitrite, UA: NEGATIVE
Protein, UA: POSITIVE — AB
Spec Grav, UA: 1.02 (ref 1.010–1.025)
Urobilinogen, UA: 0.2 E.U./dL
pH, UA: 5.5 (ref 5.0–8.0)

## 2022-05-04 LAB — CBC WITH DIFFERENTIAL/PLATELET
Basophils Absolute: 0 10*3/uL (ref 0.0–0.1)
Basophils Relative: 0.6 % (ref 0.0–3.0)
Eosinophils Absolute: 0.1 10*3/uL (ref 0.0–0.7)
Eosinophils Relative: 1.2 % (ref 0.0–5.0)
HCT: 42.3 % (ref 36.0–46.0)
Hemoglobin: 14.3 g/dL (ref 12.0–15.0)
Lymphocytes Relative: 33.3 % (ref 12.0–46.0)
Lymphs Abs: 2.2 10*3/uL (ref 0.7–4.0)
MCHC: 33.7 g/dL (ref 30.0–36.0)
MCV: 90.1 fl (ref 78.0–100.0)
Monocytes Absolute: 0.4 10*3/uL (ref 0.1–1.0)
Monocytes Relative: 6.1 % (ref 3.0–12.0)
Neutro Abs: 3.9 10*3/uL (ref 1.4–7.7)
Neutrophils Relative %: 58.8 % (ref 43.0–77.0)
Platelets: 237 10*3/uL (ref 150.0–400.0)
RBC: 4.7 Mil/uL (ref 3.87–5.11)
RDW: 13 % (ref 11.5–15.5)
WBC: 6.6 10*3/uL (ref 4.0–10.5)

## 2022-05-04 LAB — TSH: TSH: 2.07 u[IU]/mL (ref 0.35–5.50)

## 2022-05-04 LAB — HEMOGLOBIN A1C: Hgb A1c MFr Bld: 5.5 % (ref 4.6–6.5)

## 2022-05-04 LAB — VITAMIN D 25 HYDROXY (VIT D DEFICIENCY, FRACTURES): VITD: 33.61 ng/mL (ref 30.00–100.00)

## 2022-05-04 LAB — VITAMIN B12: Vitamin B-12: 429 pg/mL (ref 211–911)
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
## 2022-05-10 ENCOUNTER — Other Ambulatory Visit: Payer: Self-pay | Admitting: Obstetrics and Gynecology

## 2022-05-12 ENCOUNTER — Other Ambulatory Visit: Payer: Self-pay | Admitting: *Deleted

## 2022-05-12 DIAGNOSIS — Z79890 Hormone replacement therapy (postmenopausal): Secondary | ICD-10-CM

## 2022-05-12 NOTE — Telephone Encounter (Signed)
Patient called requesting refill on vivelle dot patch 0.05 mg patch, last annual exam 04/2020, recall for annual exam is for 06/08/22.   Message sent to appointments to schedule annual exam for 2024.

## 2022-05-13 ENCOUNTER — Other Ambulatory Visit: Payer: Self-pay

## 2022-05-13 MED ORDER — ROSUVASTATIN CALCIUM 20 MG PO TABS: 20.0000 mg | ORAL_TABLET | Freq: Every day | ORAL | 11 refills | Status: DC

## 2022-05-13 NOTE — Telephone Encounter (Signed)
Annual exam scheduled on 05/31/22.  Per note 02/2022 "She is on Vivelle patch 0.05 mg and Prometrium 200 mg nightly. "   Left message for patient to call with the pharmacy.
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
## 2022-05-18 ENCOUNTER — Encounter: Payer: Self-pay | Admitting: Physician Assistant

## 2022-05-18 ENCOUNTER — Ambulatory Visit (INDEPENDENT_AMBULATORY_CARE_PROVIDER_SITE_OTHER): Payer: BC Managed Care – PPO | Admitting: Physician Assistant

## 2022-05-18 DIAGNOSIS — F4010 Social phobia, unspecified: Secondary | ICD-10-CM

## 2022-05-18 DIAGNOSIS — G2581 Restless legs syndrome: Secondary | ICD-10-CM

## 2022-05-18 DIAGNOSIS — R5381 Other malaise: Secondary | ICD-10-CM

## 2022-05-18 DIAGNOSIS — F4310 Post-traumatic stress disorder, unspecified: Secondary | ICD-10-CM | POA: Diagnosis not present

## 2022-05-18 DIAGNOSIS — N3010 Interstitial cystitis (chronic) without hematuria: Secondary | ICD-10-CM

## 2022-05-18 DIAGNOSIS — R5383 Other fatigue: Secondary | ICD-10-CM

## 2022-05-18 DIAGNOSIS — F329 Major depressive disorder, single episode, unspecified: Secondary | ICD-10-CM

## 2022-05-18 DIAGNOSIS — F411 Generalized anxiety disorder: Secondary | ICD-10-CM

## 2022-05-18 MED ORDER — CARIPRAZINE HCL 1.5 MG PO CAPS: 1.5000 mg | ORAL_CAPSULE | Freq: Every day | ORAL | 0 refills | Status: DC

## 2022-05-18 NOTE — Progress Notes (Unsigned)
[facility]  Patient ID: Erika Mccoy,  MRN: [id number]  PCP: Isaac Bliss, Rayford Halsted, MD  Date of Evaluation: 05/18/2022 Time spent:40 minutes  Chief Complaint:  Chief Complaint   Anxiety; Depression; Insomnia; Follow-up    HISTORY/CURRENT STATUS: HPI  for routine med check.  Had the worst Christmas ever. Her kids don't understand why she feels so bad all the time, stress with her husband, and her own physical issues have made her depressed. Wonders if it's appropriate to go back on Vraylar. Took it in the past and it helped. Caused wt gain, she thinks but feels like she needs it, regardless of that SE. Has anedonia, decreased energy and motivation, isolates, ADLs are limited partly b/c of physical issues. Personal hygiene is nl. Sleeps ok w/ trazodone.Appetite is nl.  He is Spravato but insurance does not pay for it now.  She 'doesn't want to be here' but states she'd never kill herself. No HI.  States that attention is good without easy distractibility.  Able to focus on things and finish tasks to completion.   Patient denies increased energy with decreased need for sleep, increased talkativeness, racing thoughts, impulsivity or risky behaviors, increased spending, increased libido, grandiosity, increased irritability or anger, paranoia, or hallucinations.  Review of Systems  Constitutional:  Positive for malaise/fatigue.  HENT: Negative.    Eyes: Negative.   Respiratory:         SOB sometimes, inhaler help. See previous notes.   Cardiovascular: Negative.   Gastrointestinal: Negative.   Genitourinary:        IC, see past notes  Musculoskeletal:  Positive for myalgias.       Chronic muscle and joint pain, FM, OA  Skin: Negative.   Neurological: Negative.   Endo/Heme/Allergies: Negative.   Psychiatric/Behavioral:         See HPI   Individual Medical History/ Review of Systems: Changes? :No     Past Psychiatric History:    She voluntarily  committed herself in 2004 and 3 times since then. For depression. Never attempted suicide.    Past medications for mental health diagnoses include: Prozac didn't work, Effexor, Wellbutrin, Seroquel, Vraylar, Paxil, Zoloft, Cymbalta, Pristiq, Klonopin, Xanax, Trazodone, Ambien, Lunesta, Spravato started 10/2021, insurance will not pay for it ,Lamictal didn't help but took it for a long time, Adderall, Lyrica caused crying   Never had ECT, TMS  Allergies: Ibuprofen, Neomycin-bacitracin-polymyxin  [bacitracin-neomycin-polymyxin], Quinolones, Atorvastatin, Bacitracin-polymyxin b, Benzalkonium chloride, Cefprozil, Cephalexin, Ciprofloxacin, Mederma, Monistat [miconazole], Neomycin-bacitracin zn-polymyx, Polyoxyethylene 40 sorbitol septaoleate [sorbitan], Prednisone, Quinine, Valtrex [valacyclovir hcl], and Adhesive [tape]  Current Medications:  Current Outpatient Medications:    bupivacaine (MARCAINE) 0.5 % injection, 15 mLs daily as needed. Pain in bladder, Disp: , Rfl:    buPROPion (WELLBUTRIN XL) 150 MG 24 hr tablet, Take 150 mg by mouth daily. Takes w/ 300 mg daily, Disp: , Rfl:    buPROPion (WELLBUTRIN XL) 300 MG 24 hr tablet, Take 1 tablet (300 mg total) by mouth every morning., Disp: 90 tablet, Rfl: 1   cariprazine (VRAYLAR) 1.5 MG capsule, Take 1 capsule (1.5 mg total) by mouth daily., Disp: 30 capsule, Rfl: 0   Cholecalciferol 100 MCG (4000 UT) CAPS, Take by mouth., Disp: , Rfl:    clobetasol ointment (TEMOVATE) 0.05 %, APPLY SMALL AMOUNT TOPICALLY TO THE AFFECTED AREA 2 TIMES A WEEK, Disp: 60 g, Rfl: 0   clonazePAM (KLONOPIN) 1 MG tablet, TAKE 1 TABLET(1 MG) BY MOUTH THREE TIMES DAILY AS NEEDED FOR ANXIETY, must last  30 days., Disp: 90 tablet, Rfl: 2   ELMIRON 100 MG capsule, Take 100 mg by mouth in the morning and at bedtime. Takes per bladder, Disp: , Rfl:    fluticasone-salmeterol (ADVAIR HFA) 115-21 MCG/ACT inhaler, Inhale 2 puffs into the lungs 2 (two) times daily., Disp: 1 each, Rfl:  3   hydrOXYzine (ATARAX/VISTARIL) 25 MG tablet, Take 50 mg by mouth at bedtime. , Disp: , Rfl:    ipratropium (ATROVENT) 0.03 % nasal spray, Place 2 sprays into both nostrils every 12 (twelve) hours., Disp: 30 mL, Rfl: 12   methocarbamol (ROBAXIN) 500 MG tablet, Take 500 mg by mouth as needed., Disp: , Rfl:    nitrofurantoin (MACRODANTIN) 50 MG capsule, Take 50 mg by mouth daily., Disp: , Rfl:    NONFORMULARY OR COMPOUNDED ITEM, Gabapentin 6% cream in neutral base.  Apply to vulva 3 times a day as needed.  Dispense 30 grams, Disp: 30 each, Rfl: 1   pantoprazole (PROTONIX) 40 MG tablet, Take 40 mg by mouth daily., Disp: , Rfl:    pregabalin (LYRICA) 150 MG capsule, TAKE 1 CAPSULE(150 MG) BY MOUTH TWICE DAILY, Disp: 60 capsule, Rfl: 3   progesterone (PROMETRIUM) 200 MG capsule, Take 1 capsule (200 mg total) by mouth at bedtime., Disp: 90 capsule, Rfl: 3   rosuvastatin (CRESTOR) 20 MG tablet, Take 1 tablet (20 mg total) by mouth daily., Disp: 30 tablet, Rfl: 11   topiramate (TOPAMAX) 25 MG tablet, Take 75 mg by mouth., Disp: , Rfl:    traZODone (DESYREL) 100 MG tablet, Take 1 tablet (100 mg total) by mouth at bedtime., Disp: 90 tablet, Rfl: 3   UNABLE TO FIND, Med Name: Valium Suppositories per pt, Disp: , Rfl:    erythromycin ophthalmic ointment, , Disp: , Rfl:    estradiol (VIVELLE-DOT) 0.05 MG/24HR patch, Place 1 patch (0.05 mg total) onto the skin 2 (two) times a week., Disp: 8 patch, Rfl: 0 Medication Side Effects: none  Family Medical/ Social History: Changes?  No  MENTAL HEALTH EXAM:  There were no vitals taken for this visit.There is no height or weight on file to calculate BMI.  General Appearance: Casual and Well Groomed  Eye Contact:  Good  Speech:  Clear and Coherent and Normal Rate  Volume:  Normal  Mood:  Depressed and Hopeless  Affect:  Depressed and Tearful  Thought Process:  Goal Directed and Descriptions of Associations: Circumstantial  Orientation:  Full (Time, Place, and  Person)  Thought Content: Logical   Suicidal Thoughts:  No  Homicidal Thoughts:  No  Memory:  WNL  Judgement:  Good  Insight:  Good  Psychomotor Activity:  Normal  Concentration:  Concentration: Good  Recall:  Good  Fund of Knowledge: Good  Language: Good  Assets:  Desire for Improvement Financial Resources/Insurance Housing Transportation  ADL's:  Intact  Cognition: WNL  Prognosis:  Good   Labs 05/04/2022 CBC with differential normal Hemoglobin A1c normal TSH normal Vitamin D 33.6 B12 normal  04/05/2022  BMP normal except BUN of 21  09/21/2021 Lipid panel total cholesterol 134, triglycerides 283, HDL 50, LDL 41  DIAGNOSES:    ICD-10-CM   1. Treatment-resistant depression  F32.9     2. PTSD (post-traumatic stress disorder)  F43.10     3. Generalized anxiety disorder  F41.1     4. Social anxiety disorder  F40.10     5. Restless leg syndrome  G25.81     6. Malaise and fatigue  R53.81  R53.83     7. Interstitial cystitis  N30.10       Receiving Psychotherapy: Yes   with Dr. [name]  RECOMMENDATIONS:  PDMP reviewed.  Klonopin filled 05/12/2022.  Ritalin filled 03/16/2022.  Lyrica filled 04/25/2022.  Hydrocodone filled 05/03/2022. I provided 40 minutes of face to face time during this encounter, including time spent before and after the visit in records review, medical decision making, counseling pertinent to today's visit, and charting.   Discussed the Vraylar. She responded well in the past so I agree for her to take it again. Discussed potential metabolic side effects associated with atypical antipsychotics.  Labs will need to be drawn periodically to monitor metabolic function. She had recent labs so no need to repeat. Due for LP in the spring.  Discussed potential risk for movement side effects. Patient understands and accepts these risks and has been advised to contact office if any movement side effects occur.   Cont Wellbutrin XL 450 mg  daily. Start Vraylar 1.5 mg daily. 2 weeks of samples given. Continue Klonopin 1 mg, 1 po tid prn.  (She reports taking only at night.) Cont Hydroxyzine 25 mg, 2 po qhs.  Continue Trazodone 100 mg, 1/2-1 qhs prn.  Continue therapy with Dr. [name]. Return in 4 weeks.   Melony Overly, PA-C

## 2022-05-19 MED ORDER — ESTRADIOL 0.05 MG/24HR TD PTTW: 1.0000 | MEDICATED_PATCH | TRANSDERMAL | 0 refills | Status: DC
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
## 2022-05-19 NOTE — Addendum Note (Signed)
Addended by: Judy Pimple D on: 05/19/2022 11:43 AM   Modules accepted: Orders

## 2022-05-19 NOTE — Telephone Encounter (Signed)
Spoke with pt and she confirmed pharmacy is still [facility] [address].   Pt reports that she should still be good with progesterone, just is out of patches.   Mammo due 10/2022.

## 2022-05-26 ENCOUNTER — Ambulatory Visit (INDEPENDENT_AMBULATORY_CARE_PROVIDER_SITE_OTHER): Payer: BC Managed Care – PPO | Admitting: Psychiatry

## 2022-05-26 ENCOUNTER — Telehealth: Payer: Self-pay | Admitting: Pulmonary Disease

## 2022-05-26 DIAGNOSIS — F4010 Social phobia, unspecified: Secondary | ICD-10-CM

## 2022-05-26 DIAGNOSIS — R69 Illness, unspecified: Secondary | ICD-10-CM

## 2022-05-26 DIAGNOSIS — F411 Generalized anxiety disorder: Secondary | ICD-10-CM | POA: Diagnosis not present

## 2022-05-26 DIAGNOSIS — F3130 Bipolar disorder, current episode depressed, mild or moderate severity, unspecified: Secondary | ICD-10-CM | POA: Diagnosis not present

## 2022-05-26 DIAGNOSIS — F4310 Post-traumatic stress disorder, unspecified: Secondary | ICD-10-CM

## 2022-05-26 DIAGNOSIS — Z62810 Personal history of physical and sexual abuse in childhood: Secondary | ICD-10-CM

## 2022-05-26 DIAGNOSIS — Z638 Other specified problems related to primary support group: Secondary | ICD-10-CM

## 2022-05-26 NOTE — Telephone Encounter (Signed)
Please advise on cheaper inhaler options  Called [facility] was told patient has a deductible of $286.90. called spk to patient informed her of pharmacy cost said she found a cheaper option without insurance for $100.
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
## 2022-05-27 NOTE — Telephone Encounter (Signed)
Please let me know what the cheaper option is and we can send in prescription.  Thanks, JD

## 2022-05-27 NOTE — Telephone Encounter (Signed)
ATC x1 LVM for patient to call us back

## 2022-05-30 ENCOUNTER — Telehealth: Payer: Self-pay

## 2022-05-30 NOTE — Telephone Encounter (Signed)
Prior Authorization submitted and approved for Vraylar 1.5 mg effective 05/30/2022-05/29/2023 with [employer].

## 2022-05-30 NOTE — Telephone Encounter (Signed)
Due to multiple attempts trying to reach pt and unable to do so, per protocol message will be closed.

## 2022-05-30 NOTE — Telephone Encounter (Signed)
ATC LVMTCB x 1 ?

## 2022-05-31 ENCOUNTER — Ambulatory Visit: Payer: BC Managed Care – PPO | Admitting: Obstetrics and Gynecology

## 2022-05-31 ENCOUNTER — Other Ambulatory Visit (HOSPITAL_COMMUNITY)
Admission: RE | Admit: 2022-05-31 | Discharge: 2022-05-31 | Disposition: A | Discharge status: Home / Self Care | Payer: BC Managed Care – PPO | Source: Ambulatory Visit | Attending: Obstetrics and Gynecology | Admitting: Obstetrics and Gynecology

## 2022-05-31 ENCOUNTER — Encounter: Payer: Self-pay | Admitting: Obstetrics and Gynecology

## 2022-05-31 VITALS — BP 134/84 | Ht 65.0 in | Wt 166.0 lb

## 2022-05-31 DIAGNOSIS — Z01419 Encounter for gynecological examination (general) (routine) without abnormal findings: Secondary | ICD-10-CM

## 2022-05-31 DIAGNOSIS — Z124 Encounter for screening for malignant neoplasm of cervix: Secondary | ICD-10-CM

## 2022-05-31 DIAGNOSIS — R635 Abnormal weight gain: Secondary | ICD-10-CM

## 2022-05-31 MED ORDER — PROGESTERONE MICRONIZED 100 MG PO CAPS: 100.0000 mg | ORAL_CAPSULE | Freq: Every day | ORAL | 12 refills | Status: DC

## 2022-05-31 MED ORDER — ESTRADIOL 0.0375 MG/24HR TD PTTW: 1.0000 | MEDICATED_PATCH | TRANSDERMAL | 12 refills | Status: DC

## 2022-05-31 MED ORDER — PREMARIN 0.625 MG/GM VA CREA: 1.0000 | TOPICAL_CREAM | Freq: Every day | VAGINAL | 2 refills | Status: DC

## 2022-05-31 NOTE — Patient Instructions (Signed)

## 2022-06-02 ENCOUNTER — Encounter: Payer: Self-pay | Admitting: Pulmonary Disease

## 2022-06-02 ENCOUNTER — Ambulatory Visit: Payer: BC Managed Care – PPO | Admitting: Pulmonary Disease

## 2022-06-02 VITALS — BP 124/78 | HR 88 | Ht 65.0 in | Wt 162.0 lb

## 2022-06-02 DIAGNOSIS — J4520 Mild intermittent asthma, uncomplicated: Secondary | ICD-10-CM

## 2022-06-02 DIAGNOSIS — R053 Chronic cough: Secondary | ICD-10-CM | POA: Diagnosis not present

## 2022-06-02 DIAGNOSIS — R490 Dysphonia: Secondary | ICD-10-CM

## 2022-06-02 LAB — PULMONARY FUNCTION TEST
DL/VA % pred: 79 %
DL/VA: 3.38 ml/min/mmHg/L
DLCO cor % pred: 82 %
DLCO cor: 17.55 ml/min/mmHg
DLCO unc % pred: 84 %
DLCO unc: 18.01 ml/min/mmHg
FEF 25-75 Post: 2.94 L/sec
FEF 25-75 Pre: 2.43 L/sec
FEF2575-%Change-Post: 20 %
FEF2575-%Pred-Post: 112 %
FEF2575-%Pred-Pre: 92 %
FEV1-%Change-Post: 3 %
FEV1-%Pred-Post: 94 %
FEV1-%Pred-Pre: 91 %
FEV1-Post: 2.63 L
FEV1-Pre: 2.55 L
FEV1FVC-%Change-Post: 3 %
FEV1FVC-%Pred-Pre: 99 %
FEV6-%Change-Post: 0 %
FEV6-%Pred-Post: 93 %
FEV6-%Pred-Pre: 93 %
FEV6-Post: 3.22 L
FEV6-Pre: 3.21 L
FEV6FVC-%Change-Post: 0 %
FEV6FVC-%Pred-Post: 103 %
FEV6FVC-%Pred-Pre: 102 %
FVC-%Change-Post: 0 %
FVC-%Pred-Post: 90 %
FVC-%Pred-Pre: 90 %
FVC-Post: 3.22 L
FVC-Pre: 3.22 L
Post FEV1/FVC ratio: 82 %
Post FEV6/FVC ratio: 100 %
Pre FEV1/FVC ratio: 79 %
Pre FEV6/FVC Ratio: 100 %
RV % pred: 107 %
RV: 2.08 L
TLC % pred: 105 %
TLC: 5.47 L

## 2022-06-02 LAB — CYTOLOGY - PAP
Adequacy: ABSENT
Comment: NEGATIVE
Diagnosis: NEGATIVE
High risk HPV: NEGATIVE

## 2022-06-02 NOTE — Progress Notes (Signed)
PFT done today. 

## 2022-06-02 NOTE — Patient Instructions (Addendum)
Continue advair inhaler 2 puffs twice daily - rinse mouth out after each use  Your breathing tests are normal  You have mild intermittent asthma based on your history  Follow up in 1 year.
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
## 2022-06-09 ENCOUNTER — Ambulatory Visit (INDEPENDENT_AMBULATORY_CARE_PROVIDER_SITE_OTHER): Payer: BC Managed Care – PPO | Admitting: Psychiatry

## 2022-06-09 DIAGNOSIS — F3130 Bipolar disorder, current episode depressed, mild or moderate severity, unspecified: Secondary | ICD-10-CM

## 2022-06-09 DIAGNOSIS — R69 Illness, unspecified: Secondary | ICD-10-CM

## 2022-06-09 DIAGNOSIS — F411 Generalized anxiety disorder: Secondary | ICD-10-CM | POA: Diagnosis not present

## 2022-06-09 DIAGNOSIS — Z638 Other specified problems related to primary support group: Secondary | ICD-10-CM

## 2022-06-09 DIAGNOSIS — F4310 Post-traumatic stress disorder, unspecified: Secondary | ICD-10-CM
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
## 2022-06-21 ENCOUNTER — Ambulatory Visit: Payer: BC Managed Care – PPO | Attending: Internal Medicine | Admitting: Internal Medicine

## 2022-06-21 ENCOUNTER — Encounter: Payer: Self-pay | Admitting: Internal Medicine

## 2022-06-21 VITALS — BP 110/77 | HR 92 | Ht 65.0 in | Wt 171.0 lb

## 2022-06-21 DIAGNOSIS — G9332 Myalgic encephalomyelitis/chronic fatigue syndrome: Secondary | ICD-10-CM | POA: Diagnosis not present

## 2022-06-21 DIAGNOSIS — L900 Lichen sclerosus et atrophicus: Secondary | ICD-10-CM | POA: Diagnosis not present

## 2022-06-21 DIAGNOSIS — N3010 Interstitial cystitis (chronic) without hematuria: Secondary | ICD-10-CM | POA: Diagnosis not present

## 2022-06-21 DIAGNOSIS — M797 Fibromyalgia: Secondary | ICD-10-CM | POA: Diagnosis not present

## 2022-06-21 DIAGNOSIS — R809 Proteinuria, unspecified: Secondary | ICD-10-CM

## 2022-06-21 MED ORDER — PREGABALIN 75 MG PO CAPS: ORAL_CAPSULE | ORAL | 2 refills | Status: DC
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
## 2022-06-22 ENCOUNTER — Ambulatory Visit: Payer: BC Managed Care – PPO | Admitting: Physician Assistant

## 2022-06-22 LAB — PROTEIN / CREATININE RATIO, URINE
Creatinine, Urine: 88 mg/dL (ref 20–275)
Protein/Creat Ratio: 102 mg/g creat (ref 24–184)
Protein/Creatinine Ratio: 0.102 mg/mg creat (ref 0.024–0.184)
Total Protein, Urine: 9 mg/dL (ref 5–24)

## 2022-06-22 NOTE — Progress Notes (Signed)
Urine study is very reassuring there is no significant amount of protein present I do not think any additional testing or monitoring is required for ths.

## 2022-06-23 ENCOUNTER — Encounter (HOSPITAL_BASED_OUTPATIENT_CLINIC_OR_DEPARTMENT_OTHER): Payer: Self-pay | Admitting: Obstetrics & Gynecology

## 2022-06-23 ENCOUNTER — Ambulatory Visit (HOSPITAL_BASED_OUTPATIENT_CLINIC_OR_DEPARTMENT_OTHER): Payer: BC Managed Care – PPO | Admitting: Obstetrics & Gynecology

## 2022-06-23 VITALS — BP 111/82 | HR 93 | Ht 65.0 in | Wt 168.6 lb

## 2022-06-23 DIAGNOSIS — Z79890 Hormone replacement therapy (postmenopausal): Secondary | ICD-10-CM | POA: Diagnosis not present

## 2022-06-23 DIAGNOSIS — L900 Lichen sclerosus et atrophicus: Secondary | ICD-10-CM | POA: Diagnosis not present

## 2022-06-23 DIAGNOSIS — N952 Postmenopausal atrophic vaginitis: Secondary | ICD-10-CM | POA: Diagnosis not present

## 2022-06-23 MED ORDER — MOMETASONE FUROATE 0.1 % EX OINT: TOPICAL_OINTMENT | CUTANEOUS | 0 refills | Status: DC

## 2022-06-23 MED ORDER — ESTRADIOL 0.1 MG/GM VA CREA: TOPICAL_CREAM | VAGINAL | 6 refills | Status: DC
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
## 2022-06-24 ENCOUNTER — Ambulatory Visit (INDEPENDENT_AMBULATORY_CARE_PROVIDER_SITE_OTHER): Payer: BC Managed Care – PPO | Admitting: Psychiatry

## 2022-06-24 ENCOUNTER — Encounter: Payer: Self-pay | Admitting: Internal Medicine

## 2022-06-24 ENCOUNTER — Ambulatory Visit (INDEPENDENT_AMBULATORY_CARE_PROVIDER_SITE_OTHER): Payer: BC Managed Care – PPO | Admitting: Physician Assistant

## 2022-06-24 ENCOUNTER — Encounter: Payer: Self-pay | Admitting: Physician Assistant

## 2022-06-24 DIAGNOSIS — F411 Generalized anxiety disorder: Secondary | ICD-10-CM

## 2022-06-24 DIAGNOSIS — F4310 Post-traumatic stress disorder, unspecified: Secondary | ICD-10-CM | POA: Diagnosis not present

## 2022-06-24 DIAGNOSIS — F329 Major depressive disorder, single episode, unspecified: Secondary | ICD-10-CM

## 2022-06-24 DIAGNOSIS — R6889 Other general symptoms and signs: Secondary | ICD-10-CM

## 2022-06-24 DIAGNOSIS — F5105 Insomnia due to other mental disorder: Secondary | ICD-10-CM | POA: Diagnosis not present

## 2022-06-24 DIAGNOSIS — Z638 Other specified problems related to primary support group: Secondary | ICD-10-CM

## 2022-06-24 DIAGNOSIS — F3130 Bipolar disorder, current episode depressed, mild or moderate severity, unspecified: Secondary | ICD-10-CM | POA: Diagnosis not present

## 2022-06-24 DIAGNOSIS — F99 Mental disorder, not otherwise specified: Secondary | ICD-10-CM

## 2022-06-24 DIAGNOSIS — R69 Illness, unspecified: Secondary | ICD-10-CM

## 2022-06-24 MED ORDER — CARIPRAZINE HCL 1.5 MG PO CAPS: 1.5000 mg | ORAL_CAPSULE | Freq: Every day | ORAL | 0 refills | Status: DC

## 2022-06-24 MED ORDER — CLONAZEPAM 1 MG PO TABS: ORAL_TABLET | ORAL | 5 refills | Status: DC

## 2022-06-24 MED ORDER — TRAZODONE HCL 100 MG PO TABS: 100.0000 mg | ORAL_TABLET | Freq: Every day | ORAL | 5 refills | Status: DC
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
## 2022-06-24 NOTE — Progress Notes (Signed)
[facility]  Patient ID: Erika Mccoy,  MRN: [id number]  PCP: Isaac Bliss, Rayford Halsted, MD  Date of Evaluation: 06/24/2022 Time spent:20 minutes  Chief Complaint:  Chief Complaint   Anxiety; Depression; Insomnia; Follow-up     HISTORY/CURRENT STATUS: HPI  for routine med check.  We added Vraylar a month ago. States she's 'a ton better. 100%' If it wasn't for the chronic pain, "I'd be happy." Had an epidural in lumbar spine since our LOV. Hasn't helped much. Sees Pain management.   Due to her physical health, unable to enjoy much. But does have more energy and motivation since going on Vraylar.  No extreme sadness, tearfulness, or feelings of hopelessness.  Sleeps ok, uses Trazodone. ADLs and personal hygiene are normal.   Denies any changes in concentration, making decisions, or remembering things.  Appetite has not changed.  Weight is stable.  Denies suicidal or homicidal thoughts.  Patient denies increased energy with decreased need for sleep, increased talkativeness, racing thoughts, impulsivity or risky behaviors, increased spending, increased libido, grandiosity, increased irritability or anger, paranoia, or hallucinations.  Denies dizziness, syncope, seizures, tingling, tremor, tics, unsteady gait, slurred speech, confusion. Has chronic diffuse muscle pain, also lumbar pain with rad to legs, seeing pain management and ortho. Continues to f/u w/ GYN, Pulmonology, Cardiology.  Individual Medical History/ Review of Systems: Changes? :No     Past Psychiatric History:    She voluntarily committed herself in 2004 and 3 times since then. For depression. Never attempted suicide.    Past medications for mental health diagnoses include: Prozac didn't work, Effexor, Wellbutrin, Seroquel, Vraylar, Paxil, Zoloft, Cymbalta, Pristiq, Klonopin, Xanax, Trazodone, Ambien, Lunesta, Spravato started 10/2021, insurance will not pay for it, Lamictal didn't help but took it  for a long time, Adderall, Lyrica caused crying   Never had ECT, TMS  Allergies: Ibuprofen, Neomycin-bacitracin-polymyxin  [bacitracin-neomycin-polymyxin], Quinolones, Atorvastatin, Bacitracin-polymyxin b, Benzalkonium chloride, Cefprozil, Cephalexin, Ciprofloxacin, Mederma, Monistat [miconazole], Neomycin-bacitracin zn-polymyx, Polyoxyethylene 40 sorbitol septaoleate [sorbitan], Prednisone, Quinine, Valtrex [valacyclovir hcl], and Adhesive [tape]  Current Medications:  Current Outpatient Medications:    buPROPion (WELLBUTRIN XL) 150 MG 24 hr tablet, Take 150 mg by mouth daily. Takes w/ 300 mg daily, Disp: , Rfl:    buPROPion (WELLBUTRIN XL) 300 MG 24 hr tablet, Take 1 tablet (300 mg total) by mouth every morning., Disp: 90 tablet, Rfl: 1   Cholecalciferol 100 MCG (4000 UT) CAPS, Take by mouth., Disp: , Rfl:    clindamycin (CLEOCIN T) 1 % lotion, , Disp: , Rfl:    clobetasol ointment (TEMOVATE) 0.05 %, APPLY SMALL AMOUNT TOPICALLY TO THE AFFECTED AREA 2 TIMES A WEEK, Disp: 60 g, Rfl: 0   doxycycline (MONODOX) 50 MG capsule, , Disp: , Rfl:    ELMIRON 100 MG capsule, Take 100 mg by mouth in the morning and at bedtime. Takes per bladder, Disp: , Rfl:    estradiol (VIVELLE-DOT) 0.0375 MG/24HR, Place 1 patch onto the skin 2 (two) times a week., Disp: 8 patch, Rfl: 12   fluticasone-salmeterol (ADVAIR HFA) 115-21 MCG/ACT inhaler, Inhale 2 puffs into the lungs 2 (two) times daily., Disp: 1 each, Rfl: 3   hydrOXYzine (ATARAX/VISTARIL) 25 MG tablet, Take 50 mg by mouth at bedtime. , Disp: , Rfl:    methocarbamol (ROBAXIN) 500 MG tablet, Take 500 mg by mouth as needed., Disp: , Rfl:    mometasone (ELOCON) 0.1 % ointment, Apply topically twice weekly., Disp: 45 g, Rfl: 0   nitrofurantoin (MACRODANTIN) 50 MG capsule, Take  50 mg by mouth daily., Disp: , Rfl:    pantoprazole (PROTONIX) 40 MG tablet, Take 40 mg by mouth daily., Disp: , Rfl:    pregabalin (LYRICA) 75 MG capsule, Take 1 tablet in AM and 2  tablets in PM, Disp: 90 capsule, Rfl: 2   progesterone (PROMETRIUM) 100 MG capsule, Take 1 capsule (100 mg total) by mouth daily., Disp: 30 capsule, Rfl: 12   rosuvastatin (CRESTOR) 20 MG tablet, Take 1 tablet (20 mg total) by mouth daily., Disp: 30 tablet, Rfl: 11   topiramate (TOPAMAX) 25 MG tablet, Take 50 mg by mouth., Disp: , Rfl:    UNABLE TO FIND, Med Name: Valium Suppositories per pt, Disp: , Rfl:    bupivacaine (MARCAINE) 0.5 % injection, 15 mLs daily as needed. Pain in bladder, Disp: , Rfl:    cariprazine (VRAYLAR) 1.5 MG capsule, Take 1 capsule (1.5 mg total) by mouth daily., Disp: 90 capsule, Rfl: 0   clonazePAM (KLONOPIN) 1 MG tablet, TAKE 1 TABLET(1 MG) BY MOUTH THREE TIMES DAILY AS NEEDED FOR ANXIETY, must last 30 days., Disp: 90 tablet, Rfl: 5   estradiol (ESTRACE) 0.1 MG/GM vaginal cream, Apply topically, small amount, to affected vulvar region 2-3 times weekly, Disp: 42.5 g, Rfl: 6   traZODone (DESYREL) 100 MG tablet, Take 1 tablet (100 mg total) by mouth at bedtime., Disp: 90 tablet, Rfl: 5 Medication Side Effects: none  Family Medical/ Social History: Changes?  No  MENTAL HEALTH EXAM:  There were no vitals taken for this visit.There is no height or weight on file to calculate BMI.  General Appearance: Casual and Well Groomed  Eye Contact:  Good  Speech:  Clear and Coherent and Normal Rate  Volume:  Normal  Mood:  Euthymic  Affect:  Congruent and smiles/laughs appropriately  Thought Process:  Goal Directed and Descriptions of Associations: Circumstantial  Orientation:  Full (Time, Place, and Person)  Thought Content: Logical   Suicidal Thoughts:  No  Homicidal Thoughts:  No  Memory:  WNL  Judgement:  Good  Insight:  Good  Psychomotor Activity:  Normal  Concentration:  Concentration: Good  Recall:  Good  Fund of Knowledge: Good  Language: Good  Assets:  Desire for Improvement Financial Resources/Insurance Housing Transportation  ADL's:  Intact  Cognition:  WNL  Prognosis:  Good   DIAGNOSES:    ICD-10-CM   1. Treatment-resistant depression  F32.9     2. Generalized anxiety disorder  F41.1     3. Insomnia due to other mental disorder  F51.05    F99     4. Multiple chronic diseases  R69       Receiving Psychotherapy: Yes   with Dr. Luan Moore  RECOMMENDATIONS:  PDMP reviewed.  Klonopin filled 06/15/2022.  Ritalin filled 03/16/2022.  Lyrica filled 04/25/2022.  Hydrocodone filled 05/03/2022. I provided 20 minutes of face to face time during this encounter, including time spent before and after the visit in records review, medical decision making, counseling pertinent to today's visit, and charting.   I'm glad to see her feeling better as far as her mental health goes. No changes in meds needed.   Cont Wellbutrin XL 450 mg daily. Continue Vraylar 1.5 mg daily.  Continue Klonopin 1 mg, 1 po tid prn.  (She reports taking only at night.) Cont Hydroxyzine 25 mg, 2 po qhs.  Continue Trazodone 100 mg, 1/2-1 qhs prn.  Continue therapy with Dr. Luan Moore. Return in 6 weeks.  Donnal Moat, PA-C

## 2022-06-30 ENCOUNTER — Other Ambulatory Visit: Payer: Self-pay | Admitting: Physician Assistant

## 2022-07-04 ENCOUNTER — Encounter: Payer: Self-pay | Admitting: *Deleted

## 2022-07-09 ENCOUNTER — Other Ambulatory Visit: Payer: Self-pay | Admitting: Physician Assistant

## 2022-07-10 NOTE — Telephone Encounter (Signed)
Verify dose, LV is 150 mg

## 2022-07-22 ENCOUNTER — Ambulatory Visit (INDEPENDENT_AMBULATORY_CARE_PROVIDER_SITE_OTHER): Payer: BC Managed Care – PPO | Admitting: Psychiatry

## 2022-07-22 DIAGNOSIS — F5105 Insomnia due to other mental disorder: Secondary | ICD-10-CM

## 2022-07-22 DIAGNOSIS — F4310 Post-traumatic stress disorder, unspecified: Secondary | ICD-10-CM | POA: Diagnosis not present

## 2022-07-22 DIAGNOSIS — F3130 Bipolar disorder, current episode depressed, mild or moderate severity, unspecified: Secondary | ICD-10-CM | POA: Diagnosis not present

## 2022-07-22 DIAGNOSIS — R6889 Other general symptoms and signs: Secondary | ICD-10-CM

## 2022-07-22 DIAGNOSIS — R69 Illness, unspecified: Secondary | ICD-10-CM | POA: Diagnosis not present

## 2022-07-22 DIAGNOSIS — F5104 Psychophysiologic insomnia: Secondary | ICD-10-CM

## 2022-07-22 DIAGNOSIS — Z634 Disappearance and death of family member: Secondary | ICD-10-CM

## 2022-07-22 DIAGNOSIS — F99 Mental disorder, not otherwise specified: Secondary | ICD-10-CM
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
## 2022-07-25 ENCOUNTER — Telehealth: Payer: Self-pay | Admitting: Physician Assistant

## 2022-07-25 DIAGNOSIS — R413 Other amnesia: Secondary | ICD-10-CM | POA: Insufficient documentation

## 2022-07-25 NOTE — Telephone Encounter (Signed)
Erika Mccoy called at 4:45. Because she has been referred to a neurologist due the pain in her legs.  They want to prescribe steroids.  Yes this will help the pain, but it will affect her mood and psychosis.  If she has to take steroids, will there be a way to manage them with her psych medications.  Please to discuss and advise.

## 2022-07-27 ENCOUNTER — Other Ambulatory Visit: Payer: Self-pay | Admitting: Physician Assistant

## 2022-07-27 ENCOUNTER — Telehealth: Payer: Self-pay | Admitting: Physician Assistant

## 2022-07-27 MED ORDER — AMPHETAMINE-DEXTROAMPHET ER 30 MG PO CP24: 30.0000 mg | ORAL_CAPSULE | Freq: Every day | ORAL | 0 refills | Status: DC | PRN

## 2022-07-27 NOTE — Telephone Encounter (Signed)
sent 

## 2022-07-27 NOTE — Telephone Encounter (Signed)
Pt called reporting [facility] has 30 day supply of the generic adderall. Requesting Rx  be sent there. Going out of town and requesting to pick up today.

## 2022-07-28 ENCOUNTER — Ambulatory Visit: Payer: BC Managed Care – PPO | Admitting: Dietician

## 2022-08-05 ENCOUNTER — Ambulatory Visit: Payer: BC Managed Care – PPO | Admitting: Physician Assistant

## 2022-08-05 ENCOUNTER — Ambulatory Visit: Payer: BC Managed Care – PPO | Admitting: Psychiatry

## 2022-08-18 ENCOUNTER — Other Ambulatory Visit: Payer: Self-pay | Admitting: Physician Assistant

## 2022-08-22 ENCOUNTER — Ambulatory Visit: Payer: BC Managed Care – PPO | Admitting: Physician Assistant

## 2022-08-22 ENCOUNTER — Ambulatory Visit: Payer: BC Managed Care – PPO | Admitting: Psychiatry

## 2022-08-23 ENCOUNTER — Other Ambulatory Visit: Payer: Self-pay | Admitting: Physician Assistant

## 2022-08-23 NOTE — Telephone Encounter (Signed)
Pt called at 10:07a.  She said she needs the Wellbutrin 150mg  script sent to [facility] on [address].  No upcoming appt scheduled.

## 2022-08-24 ENCOUNTER — Other Ambulatory Visit: Payer: Self-pay

## 2022-08-24 MED ORDER — BUPROPION HCL ER (XL) 150 MG PO TB24: 150.0000 mg | ORAL_TABLET | Freq: Every day | ORAL | 0 refills | Status: DC

## 2022-08-31 ENCOUNTER — Encounter: Payer: Self-pay | Admitting: Internal Medicine

## 2022-09-01 ENCOUNTER — Other Ambulatory Visit: Payer: Self-pay | Admitting: Physician Assistant

## 2022-09-03 IMAGING — CT CT L SPINE W/O CM
3 of 4 series · 12 of 33 positions shown, 14 images · non-contrast
Comparison: None.

CLINICAL DATA: Fell downstairs.  Back pain.



[Series 7: coronal bone · coronal · 0.28mm/px · 3 of 61 slices shown]
[im 13/61  bone]
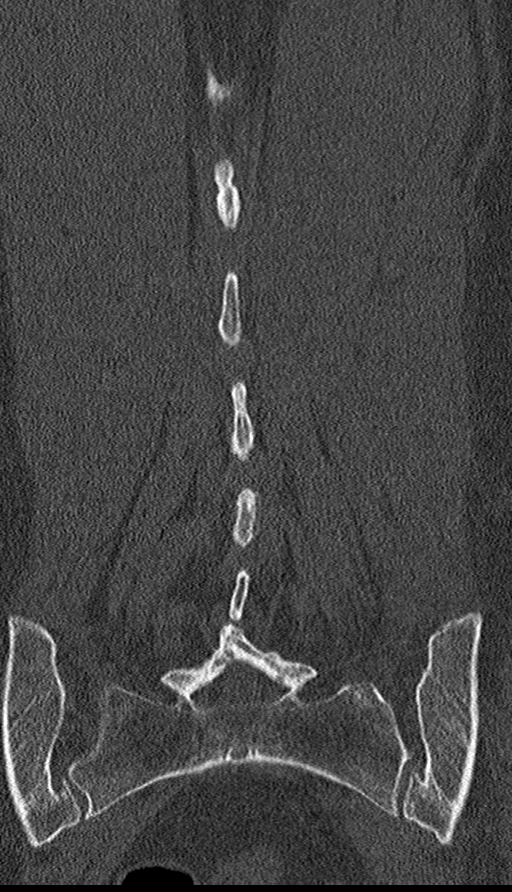
[im 25/61  bone]
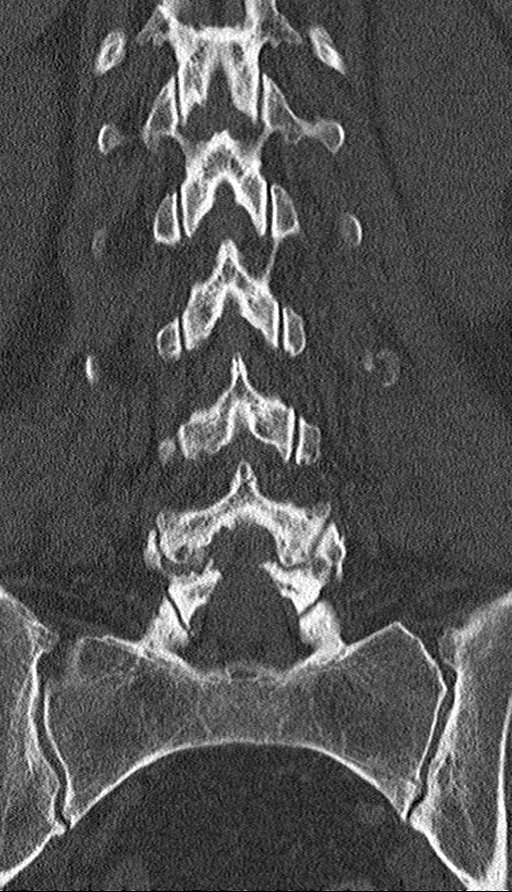
[im 37/61  bone]
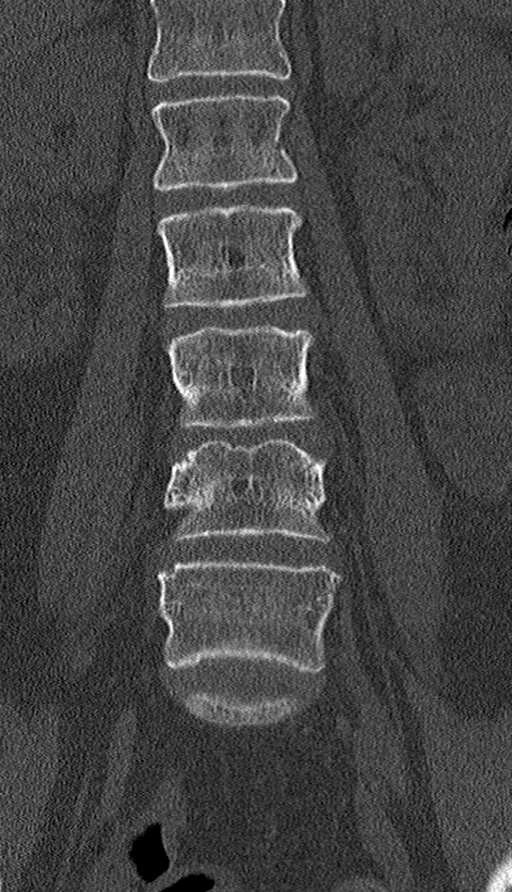

[Series 9: sagittal soft · sagittal · 0.37mm/px · 5 of 61 slices shown, 6 images]
[im 21/61  bone]
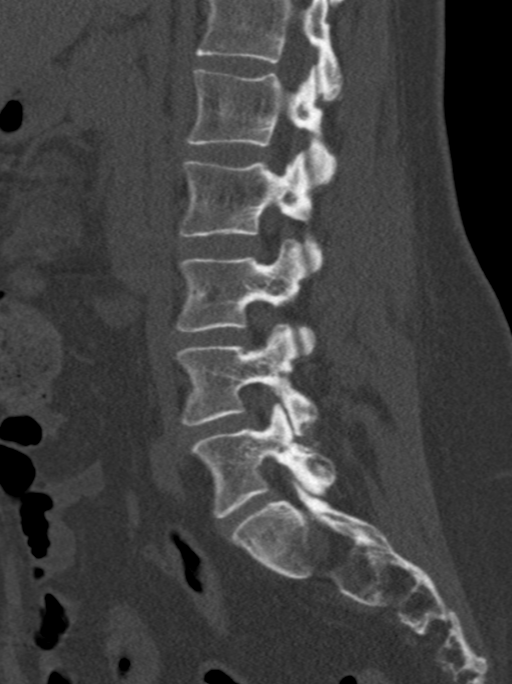
[im 26/61  bone]
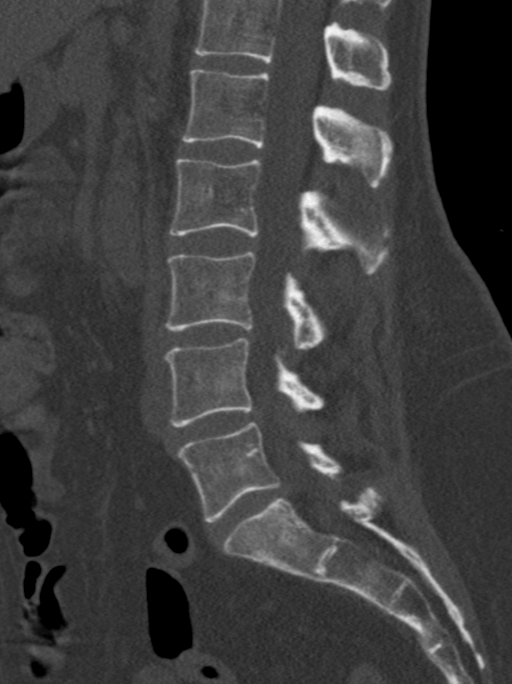
[im 31/61  soft-tissue]
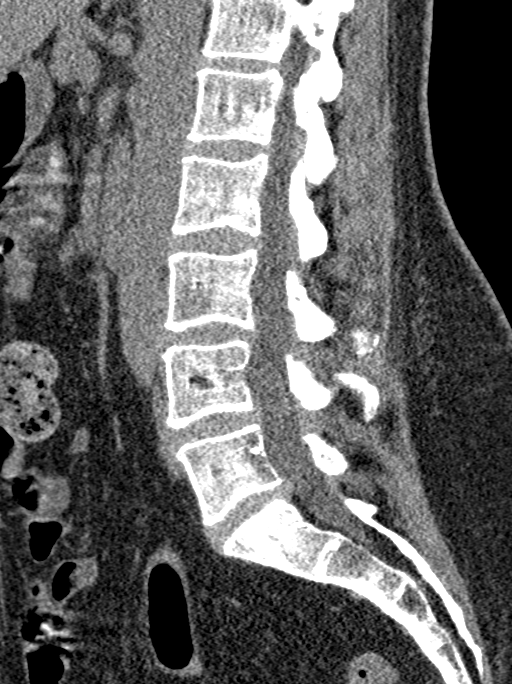
[im 31/61  bone]
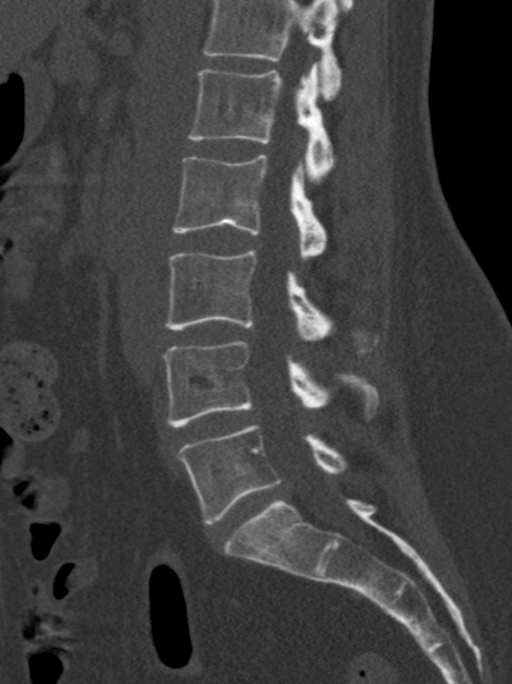
[im 36/61  bone]
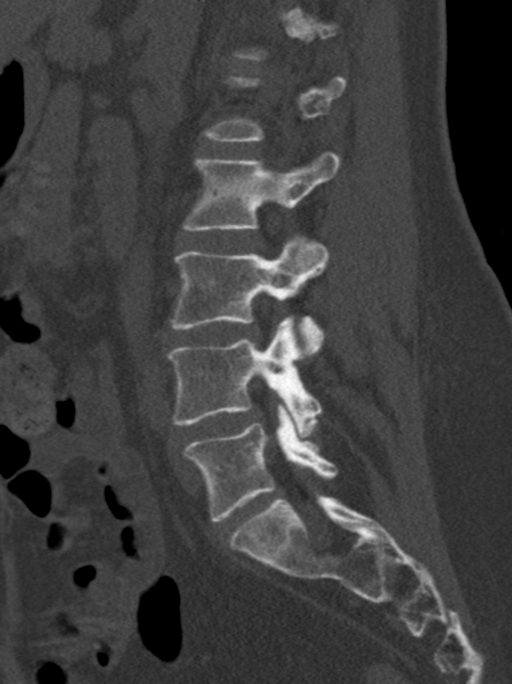
[im 41/61  bone]
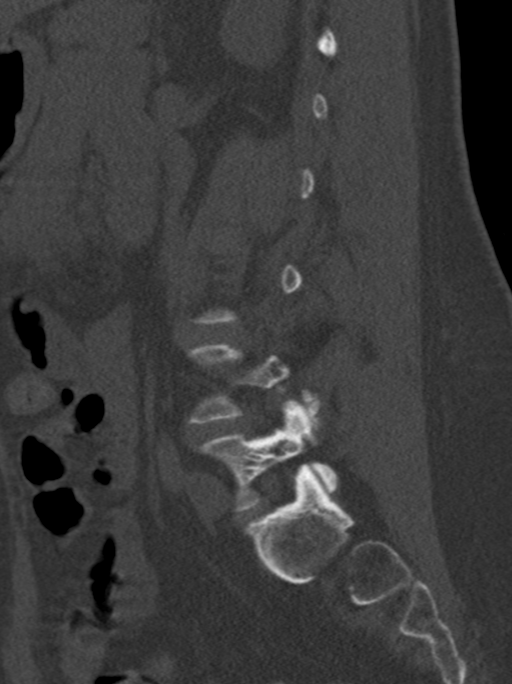

[Series 10: orthogonal axials bone · axial · 0.23mm/px · z∈[+565,+733]mm · 4 of 128 slices shown, 5 images]
[im 22/128  soft-tissue]
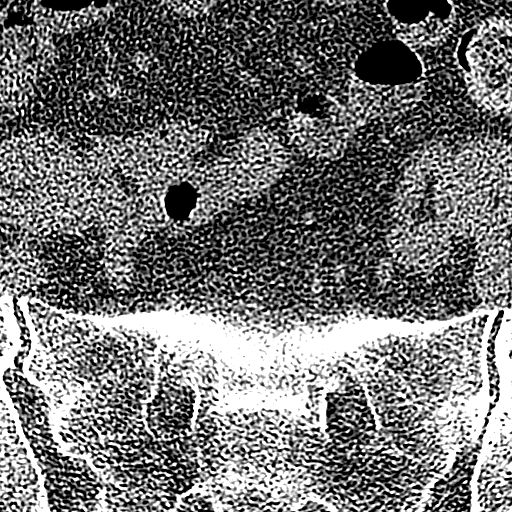
[im 22/128  bone]
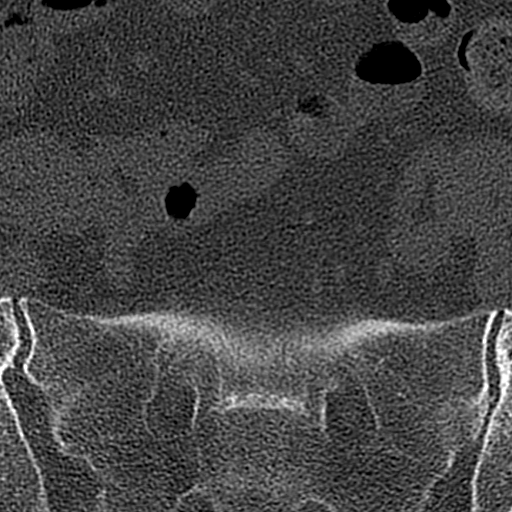
[im 43/128  bone]
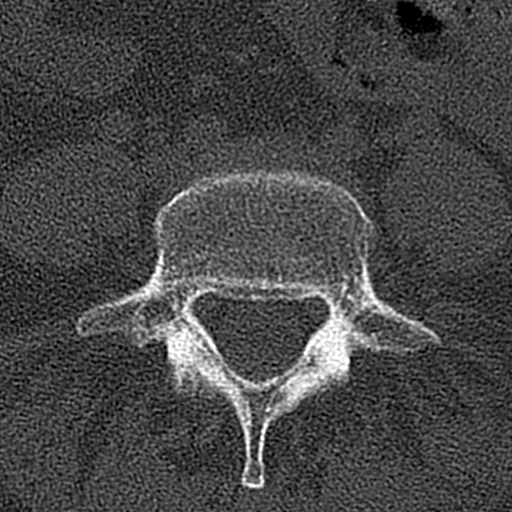
[im 85/128  bone]
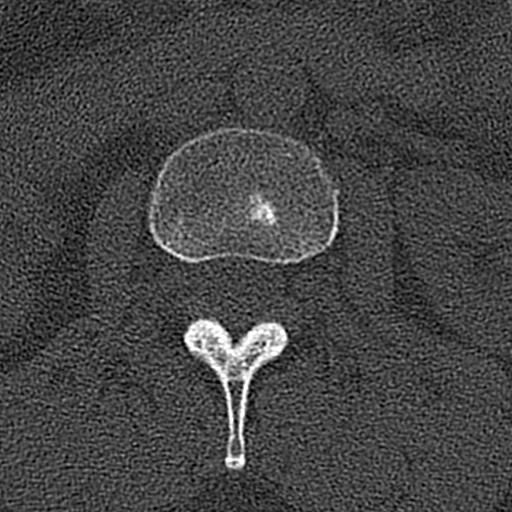
[im 106/128  bone]
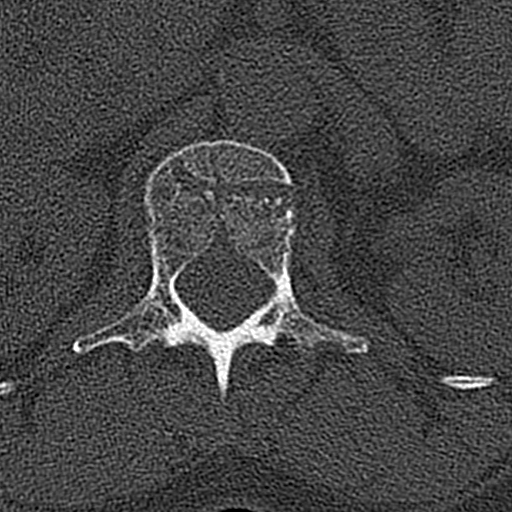

[12 of 33 positions shown; findings below may reference images not displayed]

FINDINGS: Segmentation: There are five lumbar type vertebral bodies. The last
full intervertebral disc space is labeled L5-S1.

Alignment: Normal

Vertebrae: No acute vertebral body fracture. However, there are
fractures involving the left transverse processes of L3 and L4. No
facet or laminar fractures are identified. No worrisome bone
lesions. Benign L2 hemangioma noted. The visualized lower posterior
ribs are intact.

Paraspinal and other soft tissues: No significant paraspinal or
retroperitoneal findings. There is some hematoma surrounding the
transverse process fractures.

Disc levels: No lumbar disc protrusions, spinal or foraminal
stenosis. Mild annular bulge at L4-5.
IMPRESSION: 1. Fractures involving the left transverse processes of L3 and L4.
No other spinal fractures are identified.
2. Mild annular bulge at L4-5 but no significant disc protrusions,
spinal or foraminal stenosis.

## 2022-09-03 IMAGING — DX DG HIP (WITH OR WITHOUT PELVIS) 2-3V*L*
3 series · 3 of 3 positions shown · non-contrast
Comparison: None.

CLINICAL DATA: Fall on [REDACTED] with low back pain radiating down
left leg.

EXAM:
DG HIP (WITH OR WITHOUT PELVIS) 2-3V LEFT

[pelvis ap]
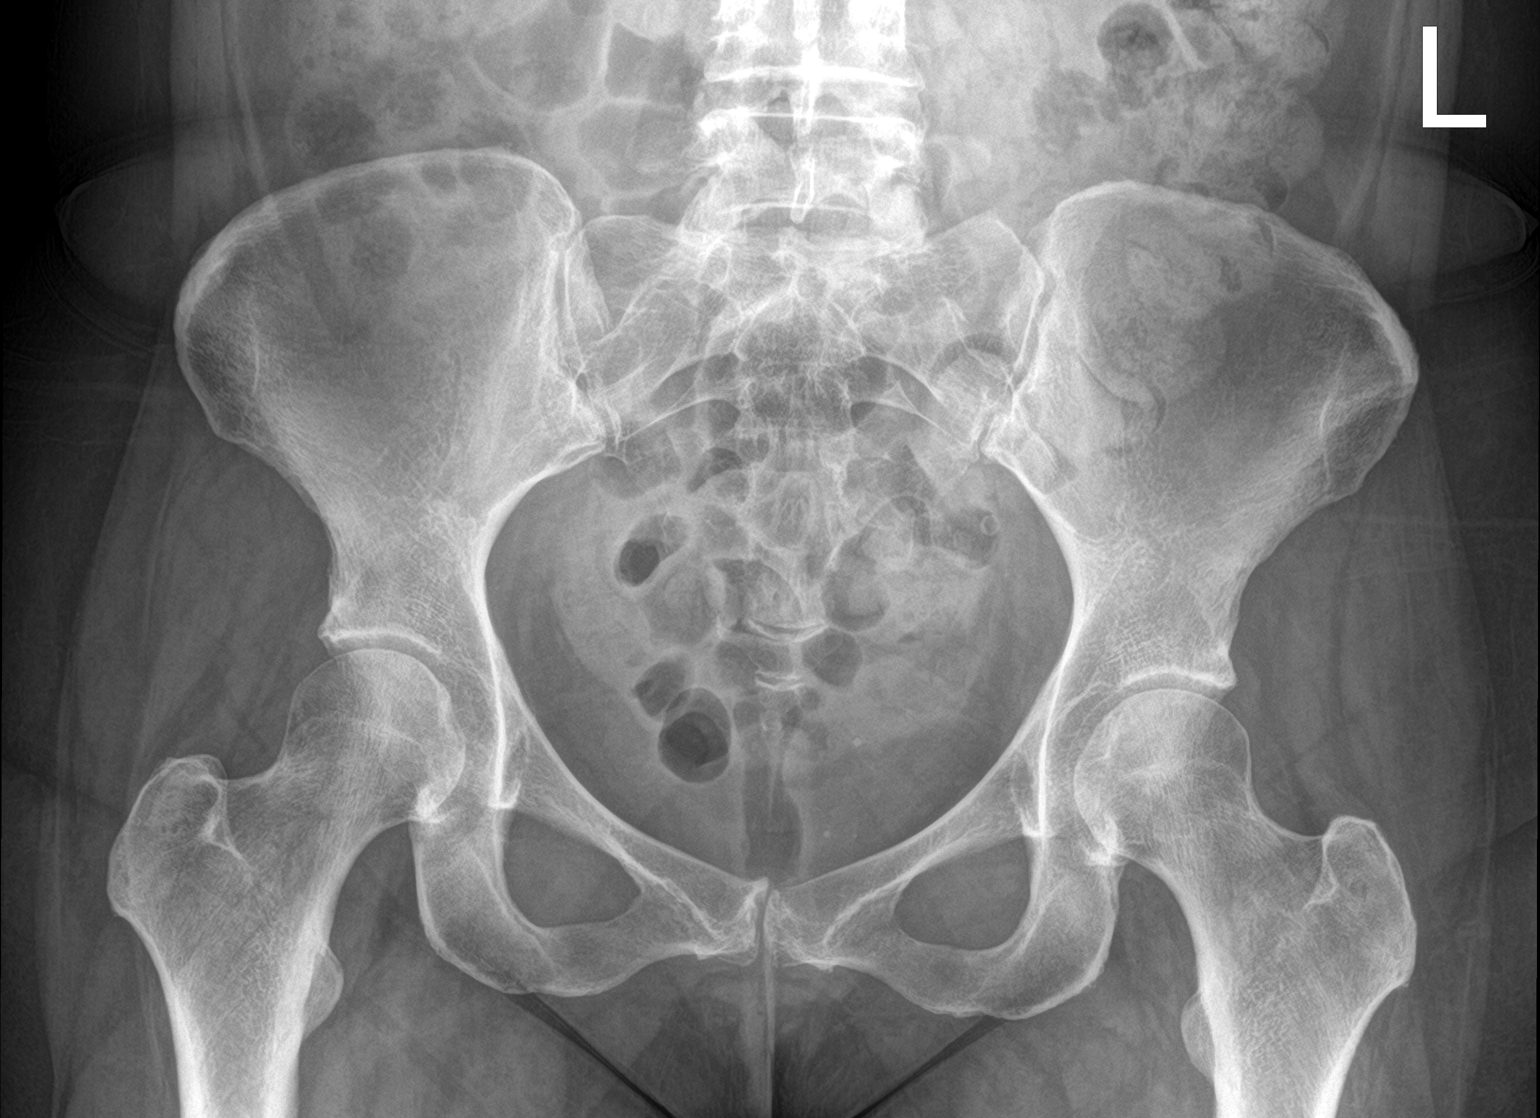

[hip ap]
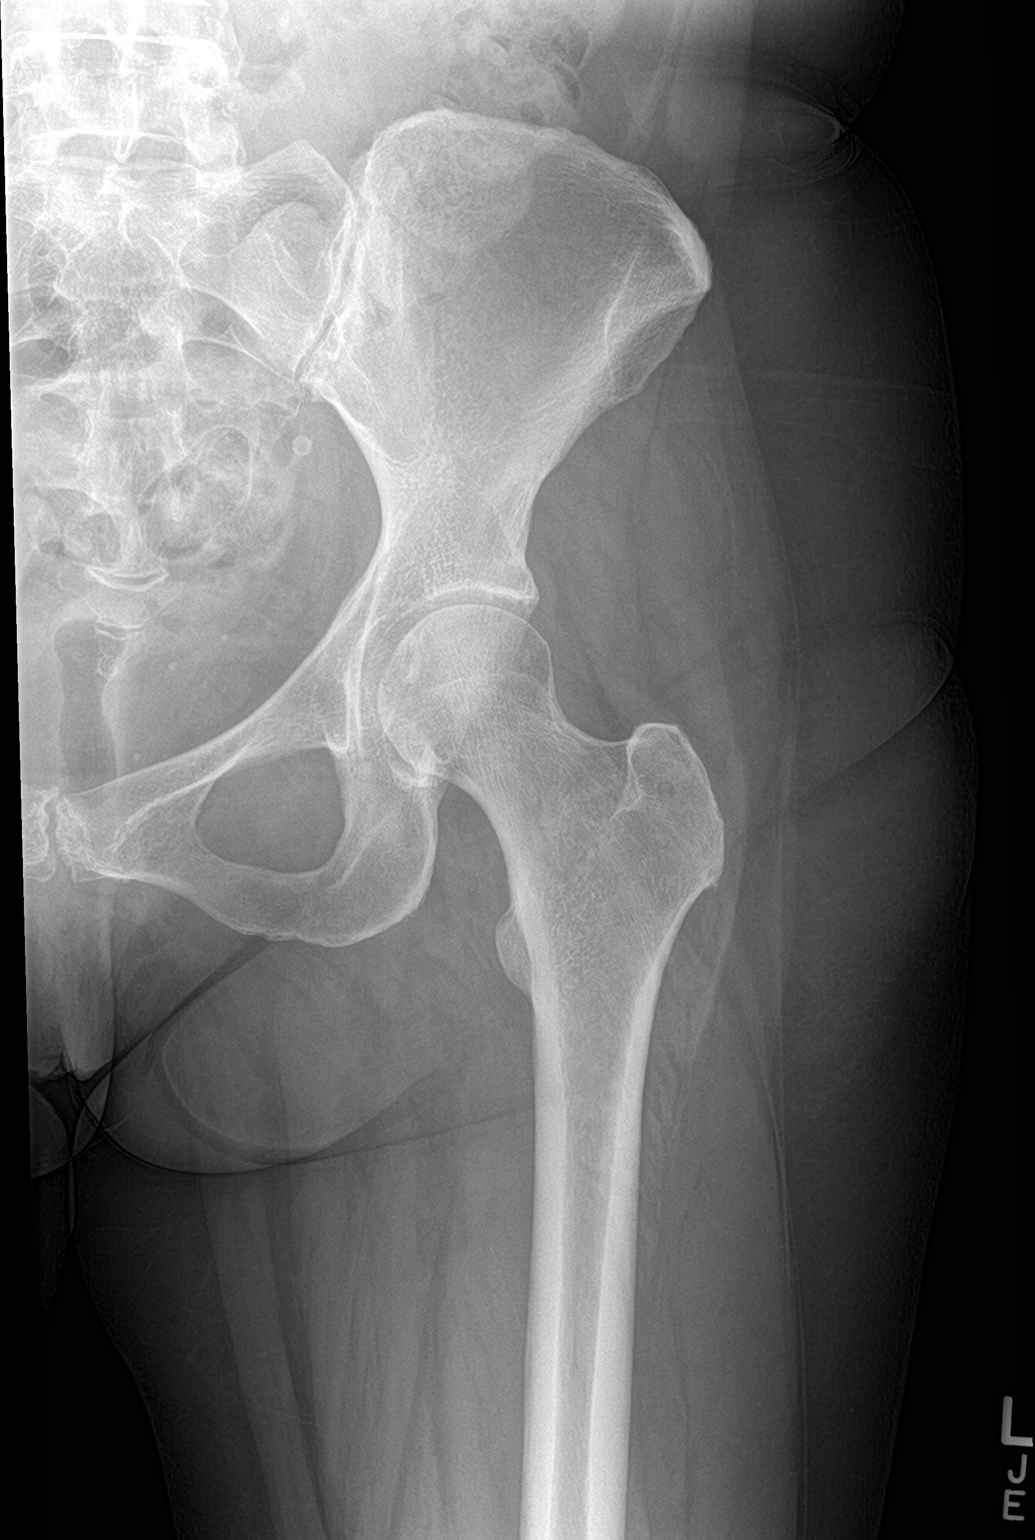

[hip lat]
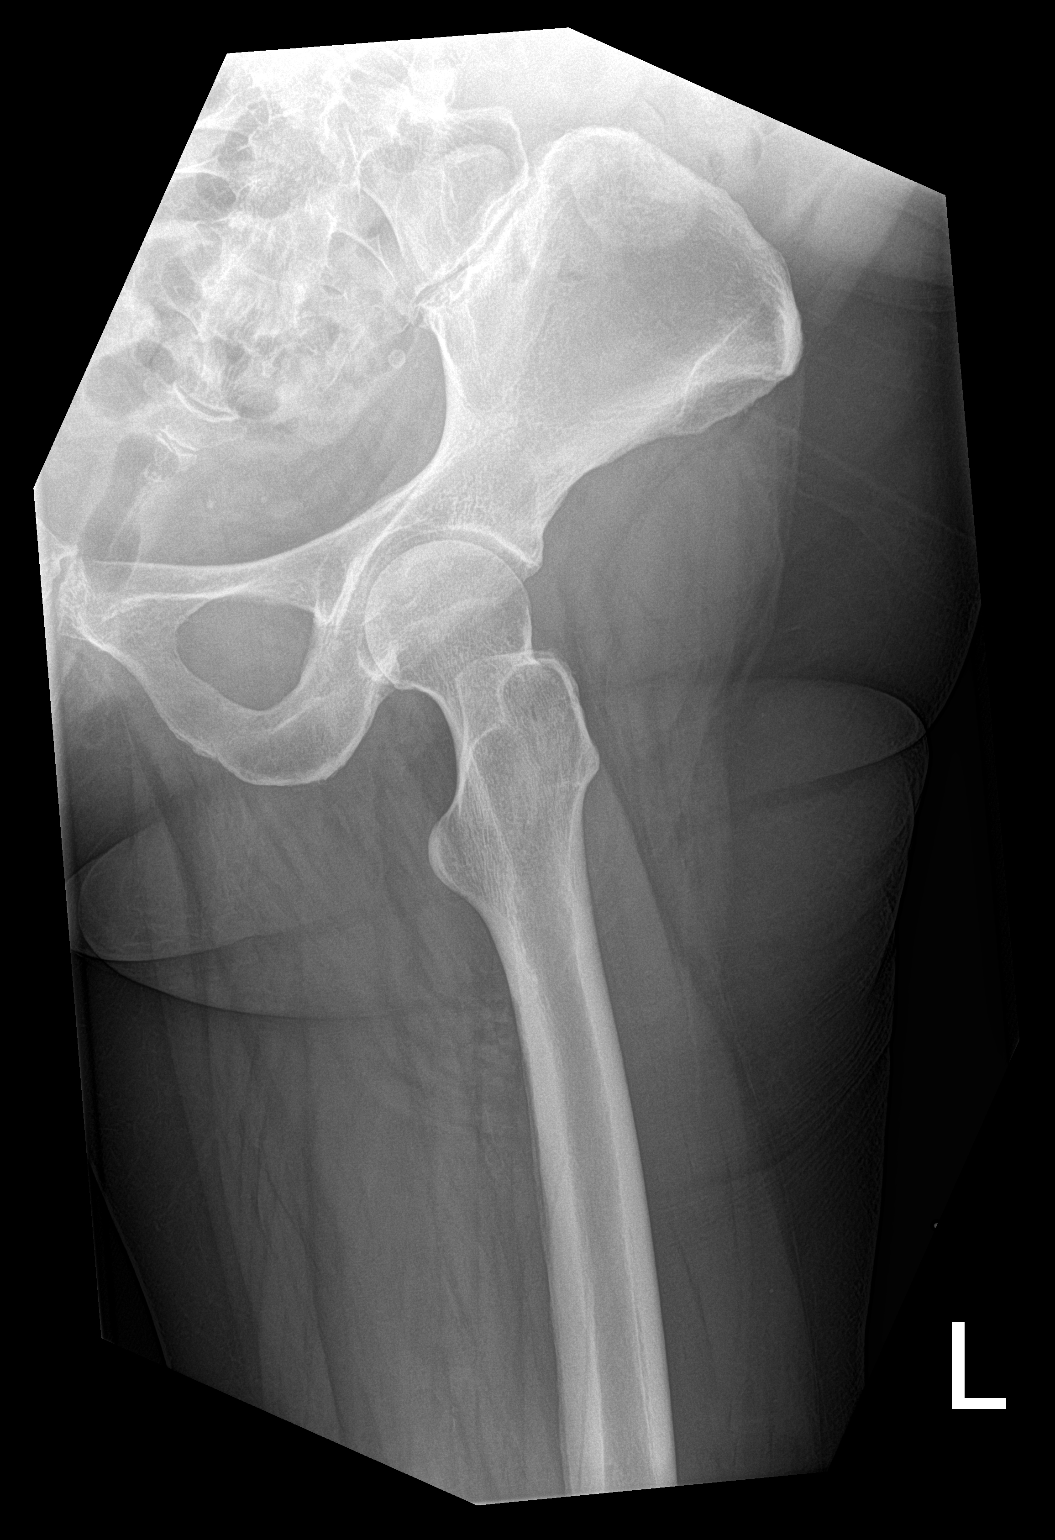

[3 of 3 positions shown; findings below may reference images not displayed]

FINDINGS: No acute fracture of the pelvis or left hip. The cortical margins
are intact. Femoral head is well seated in the acetabulum. The hip
joint spaces preserved. Intact pubic rami. Pubic symphysis and
sacroiliac joints are congruent.
IMPRESSION: No fracture of the pelvis or left hip.

## 2022-09-05 ENCOUNTER — Ambulatory Visit (INDEPENDENT_AMBULATORY_CARE_PROVIDER_SITE_OTHER): Payer: BC Managed Care – PPO | Admitting: Psychiatry

## 2022-09-05 DIAGNOSIS — Z638 Other specified problems related to primary support group: Secondary | ICD-10-CM

## 2022-09-05 DIAGNOSIS — F3130 Bipolar disorder, current episode depressed, mild or moderate severity, unspecified: Secondary | ICD-10-CM

## 2022-09-05 DIAGNOSIS — R6889 Other general symptoms and signs: Secondary | ICD-10-CM

## 2022-09-05 DIAGNOSIS — F4310 Post-traumatic stress disorder, unspecified: Secondary | ICD-10-CM | POA: Diagnosis not present
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
## 2022-09-06 ENCOUNTER — Telehealth: Payer: Self-pay | Admitting: *Deleted

## 2022-09-06 NOTE — Telephone Encounter (Signed)
Contacted regarding PREP Class referral. Left voice message to return call for more information. 

## 2022-09-12 ENCOUNTER — Encounter: Payer: Self-pay | Admitting: Physician Assistant

## 2022-09-12 ENCOUNTER — Ambulatory Visit (INDEPENDENT_AMBULATORY_CARE_PROVIDER_SITE_OTHER): Payer: BC Managed Care – PPO | Admitting: Physician Assistant

## 2022-09-12 DIAGNOSIS — F411 Generalized anxiety disorder: Secondary | ICD-10-CM

## 2022-09-12 DIAGNOSIS — F99 Mental disorder, not otherwise specified: Secondary | ICD-10-CM

## 2022-09-12 DIAGNOSIS — F4310 Post-traumatic stress disorder, unspecified: Secondary | ICD-10-CM | POA: Diagnosis not present

## 2022-09-12 DIAGNOSIS — F319 Bipolar disorder, unspecified: Secondary | ICD-10-CM | POA: Diagnosis not present

## 2022-09-12 DIAGNOSIS — F5105 Insomnia due to other mental disorder: Secondary | ICD-10-CM | POA: Diagnosis not present

## 2022-09-12 DIAGNOSIS — M797 Fibromyalgia: Secondary | ICD-10-CM

## 2022-09-12 DIAGNOSIS — R69 Illness, unspecified: Secondary | ICD-10-CM

## 2022-09-12 DIAGNOSIS — R6889 Other general symptoms and signs: Secondary | ICD-10-CM

## 2022-09-12 MED ORDER — AMPHETAMINE-DEXTROAMPHET ER 30 MG PO CP24: 30.0000 mg | ORAL_CAPSULE | Freq: Every day | ORAL | 0 refills | Status: DC | PRN

## 2022-09-12 NOTE — Progress Notes (Unsigned)
[facility]  Patient ID: Erika Mccoy,  MRN: [id number]  PCP: Philip Aspen, Limmie Patricia, MD  Date of Evaluation: 09/12/2022 Time spent:20 minutes  Chief Complaint:  Chief Complaint   Depression; ADD; Anxiety; Follow-up    HISTORY/CURRENT STATUS: HPI  for routine med check.  Still having a lot joint pain, multiple areas. Feels like bee stings in lots of places. Not sure what is going on. She has an appointment w/ neuro tomorrow. Thinks she might need to take steroids, prefers not to     Patient denies increased energy with decreased need for sleep, increased talkativeness, racing thoughts, impulsivity or risky behaviors, increased spending, increased libido, grandiosity, increased irritability or anger, paranoia, or hallucinations.  Denies dizziness, syncope, seizures, tingling, tremor, tics, unsteady gait, slurred speech, confusion. Has chronic diffuse muscle pain, also lumbar pain with rad to legs, seeing pain management and ortho. Continues to f/u w/ GYN, Pulmonology, Cardiology.  Individual Medical History/ Review of Systems: Changes? :No     Past Psychiatric History:    She voluntarily committed herself in 2004 and 3 times since then. For depression. Never attempted suicide.    Past medications for mental health diagnoses include: Prozac didn't work, Effexor, Wellbutrin, Seroquel, Vraylar, Paxil, Zoloft, Cymbalta, Pristiq, Klonopin, Xanax, Trazodone, Ambien, Lunesta, Spravato started 10/2021, insurance will not pay for it, Lamictal didn't help but took it for a long time, Adderall, Lyrica caused crying   Never had ECT, TMS  Allergies: Ibuprofen, Neomycin-bacitracin-polymyxin  [bacitracin-neomycin-polymyxin], Quinolones, Atorvastatin, Bacitracin-polymyxin b, Benzalkonium chloride, Cefprozil, Cephalexin, Ciprofloxacin, Mederma, Monistat [miconazole], Neomycin-bacitracin zn-polymyx, Polyoxyethylene 40 sorbitol septaoleate [sorbitan], Prednisone, Quinine, Valtrex  [valacyclovir hcl], and Adhesive [tape]  Current Medications:  Current Outpatient Medications:    bupivacaine (MARCAINE) 0.5 % injection, 15 mLs daily as needed. Pain in bladder, Disp: , Rfl:    buPROPion (WELLBUTRIN XL) 150 MG 24 hr tablet, Take 1 tablet (150 mg total) by mouth daily. Takes w/ 300 mg daily, Disp: 90 tablet, Rfl: 0   buPROPion (WELLBUTRIN XL) 300 MG 24 hr tablet, TAKE 1 TABLET(300 MG) BY MOUTH EVERY MORNING, Disp: 90 tablet, Rfl: 1   Cholecalciferol 100 MCG (4000 UT) CAPS, Take by mouth., Disp: , Rfl:    clindamycin (CLEOCIN T) 1 % lotion, , Disp: , Rfl:    clobetasol ointment (TEMOVATE) 0.05 %, APPLY SMALL AMOUNT TOPICALLY TO THE AFFECTED AREA 2 TIMES A WEEK, Disp: 60 g, Rfl: 0   clonazePAM (KLONOPIN) 1 MG tablet, TAKE 1 TABLET(1 MG) BY MOUTH THREE TIMES DAILY AS NEEDED FOR ANXIETY, must last 30 days., Disp: 90 tablet, Rfl: 5   doxycycline (MONODOX) 50 MG capsule, , Disp: , Rfl:    ELMIRON 100 MG capsule, Take 100 mg by mouth in the morning and at bedtime. Takes per bladder, Disp: , Rfl:    estradiol (ESTRACE) 0.1 MG/GM vaginal cream, Apply topically, small amount, to affected vulvar region 2-3 times weekly, Disp: 42.5 g, Rfl: 6   estradiol (VIVELLE-DOT) 0.0375 MG/24HR, Place 1 patch onto the skin 2 (two) times a week., Disp: 8 patch, Rfl: 12   fluticasone-salmeterol (ADVAIR HFA) 115-21 MCG/ACT inhaler, Inhale 2 puffs into the lungs 2 (two) times daily., Disp: 1 each, Rfl: 3   HYDROcodone-acetaminophen (NORCO) 10-325 MG tablet, Take 1 tablet 3 times a day by oral route as needed for pain for 30 days., Disp: , Rfl:    hydrOXYzine (ATARAX/VISTARIL) 25 MG tablet, Take 50 mg by mouth at bedtime. , Disp: , Rfl:    methocarbamol (ROBAXIN) 500  MG tablet, Take 500 mg by mouth as needed., Disp: , Rfl:    mometasone (ELOCON) 0.1 % ointment, Apply topically twice weekly., Disp: 45 g, Rfl: 0   nitrofurantoin (MACRODANTIN) 50 MG capsule, Take 50 mg by mouth daily., Disp: , Rfl:     pantoprazole (PROTONIX) 40 MG tablet, Take 40 mg by mouth daily., Disp: , Rfl:    pregabalin (LYRICA) 75 MG capsule, Take 1 tablet in AM and 2 tablets in PM, Disp: 90 capsule, Rfl: 2   progesterone (PROMETRIUM) 100 MG capsule, Take 1 capsule (100 mg total) by mouth daily., Disp: 30 capsule, Rfl: 12   rosuvastatin (CRESTOR) 20 MG tablet, Take 1 tablet (20 mg total) by mouth daily., Disp: 30 tablet, Rfl: 11   topiramate (TOPAMAX) 25 MG tablet, Take 50 mg by mouth., Disp: , Rfl:    traZODone (DESYREL) 100 MG tablet, Take 1 tablet (100 mg total) by mouth at bedtime., Disp: 90 tablet, Rfl: 5   UNABLE TO FIND, Med Name: Valium Suppositories per pt, Disp: , Rfl:    VRAYLAR 1.5 MG capsule, TAKE 1 CAPSULE BY MOUTH DAILY, Disp: 90 capsule, Rfl: 0   amphetamine-dextroamphetamine (ADDERALL XR) 30 MG 24 hr capsule, Take 1 capsule (30 mg total) by mouth daily as needed. Take sparingly and not with Klonopin, Disp: 30 capsule, Rfl: 0 Medication Side Effects: none  Family Medical/ Social History: Changes?  No  MENTAL HEALTH EXAM:  There were no vitals taken for this visit.There is no height or weight on file to calculate BMI.  General Appearance: Casual and Well Groomed  Eye Contact:  Good  Speech:  Clear and Coherent and Normal Rate  Volume:  Normal  Mood:  Euthymic  Affect:  Congruent and smiles/laughs appropriately  Thought Process:  Goal Directed and Descriptions of Associations: Circumstantial  Orientation:  Full (Time, Place, and Person)  Thought Content: Logical   Suicidal Thoughts:  No  Homicidal Thoughts:  No  Memory:  WNL  Judgement:  Good  Insight:  Good  Psychomotor Activity:  Normal  Concentration:  Concentration: Good  Recall:  Good  Fund of Knowledge: Good  Language: Good  Assets:  Desire for Improvement Financial Resources/Insurance Housing Transportation  ADL's:  Intact  Cognition: WNL  Prognosis:  Good   DIAGNOSES:  No diagnosis found.  Receiving Psychotherapy: Yes    with Dr. [name]  RECOMMENDATIONS:  PDMP reviewed.  No controlled substances listed. I provided 20 minutes of face to face time during this encounter, including time spent before and after the visit in records review, medical decision making, counseling pertinent to today's visit, and charting.      Cont Wellbutrin XL 450 mg daily. Continue Vraylar 1.5 mg daily.  Continue Klonopin 1 mg, 1 po tid prn.  (She reports taking only at night.) Cont Hydroxyzine 25 mg, 2 po qhs.  Continue Trazodone 100 mg, 1/2-1 qhs prn.  Continue therapy with Dr. [name]. Return in 6 weeks.  Melony Overly, PA-C

## 2022-09-13 ENCOUNTER — Encounter: Payer: Self-pay | Admitting: Neurology

## 2022-09-13 ENCOUNTER — Ambulatory Visit: Payer: BC Managed Care – PPO | Admitting: Neurology

## 2022-09-13 VITALS — BP 94/63 | HR 85 | Ht 64.5 in | Wt 173.5 lb

## 2022-09-13 DIAGNOSIS — M792 Neuralgia and neuritis, unspecified: Secondary | ICD-10-CM

## 2022-09-13 DIAGNOSIS — M797 Fibromyalgia: Secondary | ICD-10-CM

## 2022-09-13 DIAGNOSIS — G894 Chronic pain syndrome: Secondary | ICD-10-CM | POA: Diagnosis not present

## 2022-09-13 MED ORDER — DULOXETINE HCL 30 MG PO CPEP: 30.0000 mg | ORAL_CAPSULE | Freq: Every day | ORAL | 0 refills | Status: DC
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
## 2022-09-14 ENCOUNTER — Other Ambulatory Visit: Payer: Self-pay | Admitting: Internal Medicine

## 2022-09-14 ENCOUNTER — Telehealth: Payer: Self-pay | Admitting: Physician Assistant

## 2022-09-14 DIAGNOSIS — M797 Fibromyalgia: Secondary | ICD-10-CM

## 2022-09-14 NOTE — Telephone Encounter (Signed)
Thea called to say that [facility] in [place] does not have clonazepam in stock. She would like it resent to [facility] on [address] please. In stock there.

## 2022-09-14 NOTE — Telephone Encounter (Signed)
Last Fill: 06/21/2022  Next Visit: Due May 2024. Message sent to the front to schedule.   Last Visit: 06/21/2022  Dx: Fibromyalgia   Current Dose per office note on 06/21/2022: having a lot of daytime drowsiness at the 150 mg dose I recommend we try decreasing to 75 mg and 150 mg p.m. and see if this maintains symptom improvement with less side effect.   Okay to refill Lyrica?

## 2022-09-15 ENCOUNTER — Other Ambulatory Visit: Payer: Self-pay | Admitting: Physician Assistant

## 2022-09-15 MED ORDER — CLONAZEPAM 1 MG PO TABS: ORAL_TABLET | ORAL | 0 refills | Status: DC

## 2022-09-15 NOTE — Telephone Encounter (Signed)
sent 

## 2022-09-16 NOTE — Patient Instructions (Addendum)
Hold Crestor for a couple week, if no improvement of your pain, please resume it  Trial of Duloxetine for chronic pain  Continue your current medications  Continue to follow up with [facility] for management of pain  Return in 6 months

## 2022-09-23 ENCOUNTER — Ambulatory Visit: Payer: BC Managed Care – PPO | Admitting: Psychiatry

## 2022-09-26 ENCOUNTER — Ambulatory Visit: Payer: BC Managed Care – PPO | Admitting: Psychiatry

## 2022-10-03 ENCOUNTER — Encounter (HOSPITAL_BASED_OUTPATIENT_CLINIC_OR_DEPARTMENT_OTHER): Payer: Self-pay | Admitting: Obstetrics & Gynecology

## 2022-10-03 ENCOUNTER — Other Ambulatory Visit (HOSPITAL_BASED_OUTPATIENT_CLINIC_OR_DEPARTMENT_OTHER): Payer: Self-pay | Admitting: Obstetrics & Gynecology

## 2022-10-03 DIAGNOSIS — N952 Postmenopausal atrophic vaginitis: Secondary | ICD-10-CM

## 2022-10-03 MED ORDER — ESTRADIOL 0.0375 MG/24HR TD PTTW: 1.0000 | MEDICATED_PATCH | TRANSDERMAL | 3 refills | Status: DC

## 2022-10-05 ENCOUNTER — Ambulatory Visit (INDEPENDENT_AMBULATORY_CARE_PROVIDER_SITE_OTHER): Payer: BC Managed Care – PPO | Admitting: Internal Medicine

## 2022-10-05 ENCOUNTER — Encounter: Payer: Self-pay | Admitting: Internal Medicine

## 2022-10-05 VITALS — BP 124/80 | HR 88 | Temp 98.0°F | Wt 174.0 lb

## 2022-10-05 DIAGNOSIS — E782 Mixed hyperlipidemia: Secondary | ICD-10-CM | POA: Diagnosis not present

## 2022-10-05 DIAGNOSIS — M791 Myalgia, unspecified site: Secondary | ICD-10-CM

## 2022-10-05 DIAGNOSIS — M2550 Pain in unspecified joint: Secondary | ICD-10-CM
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
## 2022-10-14 ENCOUNTER — Other Ambulatory Visit: Payer: Self-pay | Admitting: Obstetrics & Gynecology

## 2022-10-14 DIAGNOSIS — Z1231 Encounter for screening mammogram for malignant neoplasm of breast: Secondary | ICD-10-CM

## 2022-10-16 ENCOUNTER — Other Ambulatory Visit: Payer: Self-pay | Admitting: Physician Assistant

## 2022-10-17 ENCOUNTER — Ambulatory Visit: Payer: BC Managed Care – PPO | Admitting: Internal Medicine

## 2022-10-19 ENCOUNTER — Telehealth (HOSPITAL_BASED_OUTPATIENT_CLINIC_OR_DEPARTMENT_OTHER): Payer: Self-pay | Admitting: Obstetrics & Gynecology

## 2022-10-19 NOTE — Telephone Encounter (Signed)
Patient called stated she needs come in ASAP she in a lot pain .

## 2022-10-20 ENCOUNTER — Ambulatory Visit: Payer: BC Managed Care – PPO | Admitting: Internal Medicine

## 2022-10-20 NOTE — Telephone Encounter (Signed)
Pt is requesting to be seen for swelling and pain that she says is on her right side. She describes it as being in her lower groin, labia area. Pt was in a location where she could not go into much detail. Pt provided with appt for evaluation.

## 2022-10-21 ENCOUNTER — Encounter (HOSPITAL_BASED_OUTPATIENT_CLINIC_OR_DEPARTMENT_OTHER): Payer: Self-pay | Admitting: Obstetrics & Gynecology

## 2022-10-21 ENCOUNTER — Ambulatory Visit (HOSPITAL_BASED_OUTPATIENT_CLINIC_OR_DEPARTMENT_OTHER): Payer: BC Managed Care – PPO | Admitting: Obstetrics & Gynecology

## 2022-10-21 VITALS — BP 109/81 | HR 107 | Ht 65.0 in | Wt 173.2 lb

## 2022-10-21 DIAGNOSIS — R1903 Right lower quadrant abdominal swelling, mass and lump: Secondary | ICD-10-CM

## 2022-10-21 DIAGNOSIS — R1031 Right lower quadrant pain: Secondary | ICD-10-CM
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
## 2022-10-24 NOTE — Addendum Note (Signed)
Addended by: Harrie Jeans on: 10/24/2022 02:18 PM   Modules accepted: Orders

## 2022-10-25 ENCOUNTER — Ambulatory Visit (INDEPENDENT_AMBULATORY_CARE_PROVIDER_SITE_OTHER): Payer: BC Managed Care – PPO | Admitting: Psychiatry

## 2022-10-25 DIAGNOSIS — R6889 Other general symptoms and signs: Secondary | ICD-10-CM | POA: Diagnosis not present

## 2022-10-25 DIAGNOSIS — F319 Bipolar disorder, unspecified: Secondary | ICD-10-CM | POA: Diagnosis not present

## 2022-10-25 DIAGNOSIS — F4310 Post-traumatic stress disorder, unspecified: Secondary | ICD-10-CM | POA: Diagnosis not present

## 2022-10-25 DIAGNOSIS — F1021 Alcohol dependence, in remission: Secondary | ICD-10-CM

## 2022-10-25 DIAGNOSIS — R69 Illness, unspecified: Secondary | ICD-10-CM | POA: Diagnosis not present

## 2022-10-25 DIAGNOSIS — Z62810 Personal history of physical and sexual abuse in childhood: Secondary | ICD-10-CM

## 2022-10-25 DIAGNOSIS — F4010 Social phobia, unspecified: Secondary | ICD-10-CM

## 2022-10-25 DIAGNOSIS — Z634 Disappearance and death of family member: Secondary | ICD-10-CM
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
## 2022-10-31 DIAGNOSIS — Z1231 Encounter for screening mammogram for malignant neoplasm of breast: Secondary | ICD-10-CM
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
## 2022-11-01 ENCOUNTER — Ambulatory Visit (INDEPENDENT_AMBULATORY_CARE_PROVIDER_SITE_OTHER): Payer: BC Managed Care – PPO | Admitting: Physician Assistant

## 2022-11-01 ENCOUNTER — Encounter: Payer: Self-pay | Admitting: Internal Medicine

## 2022-11-01 ENCOUNTER — Encounter: Payer: Self-pay | Admitting: Physician Assistant

## 2022-11-01 ENCOUNTER — Ambulatory Visit: Payer: BC Managed Care – PPO | Attending: Internal Medicine | Admitting: Internal Medicine

## 2022-11-01 VITALS — BP 119/84 | HR 98 | Resp 16 | Ht 65.0 in | Wt 169.0 lb

## 2022-11-01 DIAGNOSIS — R768 Other specified abnormal immunological findings in serum: Secondary | ICD-10-CM

## 2022-11-01 DIAGNOSIS — F4010 Social phobia, unspecified: Secondary | ICD-10-CM

## 2022-11-01 DIAGNOSIS — M7052 Other bursitis of knee, left knee: Secondary | ICD-10-CM | POA: Diagnosis not present

## 2022-11-01 DIAGNOSIS — M2550 Pain in unspecified joint: Secondary | ICD-10-CM

## 2022-11-01 DIAGNOSIS — F5105 Insomnia due to other mental disorder: Secondary | ICD-10-CM | POA: Diagnosis not present

## 2022-11-01 DIAGNOSIS — F99 Mental disorder, not otherwise specified: Secondary | ICD-10-CM

## 2022-11-01 DIAGNOSIS — M7062 Trochanteric bursitis, left hip: Secondary | ICD-10-CM

## 2022-11-01 DIAGNOSIS — R6889 Other general symptoms and signs: Secondary | ICD-10-CM | POA: Diagnosis not present

## 2022-11-01 DIAGNOSIS — M7050 Other bursitis of knee, unspecified knee: Secondary | ICD-10-CM | POA: Diagnosis not present

## 2022-11-01 DIAGNOSIS — F3130 Bipolar disorder, current episode depressed, mild or moderate severity, unspecified: Secondary | ICD-10-CM | POA: Diagnosis not present

## 2022-11-01 DIAGNOSIS — M797 Fibromyalgia: Secondary | ICD-10-CM

## 2022-11-01 DIAGNOSIS — M7061 Trochanteric bursitis, right hip: Secondary | ICD-10-CM

## 2022-11-01 MED ORDER — CARIPRAZINE HCL 3 MG PO CAPS: 3.0000 mg | ORAL_CAPSULE | Freq: Every day | ORAL | 1 refills | Status: DC

## 2022-11-01 NOTE — Progress Notes (Signed)
[facility]  Patient ID: Erika Mccoy,  MRN: [id number]  PCP: Philip Aspen, Limmie Patricia, MD  Date of Evaluation: 11/01/2022 Time spent:20 minutes  Chief Complaint:  Chief Complaint   Anxiety; Depression; Insomnia; Follow-up    HISTORY/CURRENT STATUS: HPI  for routine med check.  In a lot of pain, all over, it's still bad, even with Hydrocodone tid. Has an appointment w/ Rheumatology this afternoon, "It feels like RA." Never been dx w/ it before.   The chronic pain affects her mental health.  Is crying a little easier, does not want to do much of anything, is not sleeping well, but part of that is because of the pain.  Energy and motivation are low.  ADLs are limited because of pain.  Personal hygiene is normal.  Appetite is normal and weight is stable.  No suicidal or homicidal thoughts.  Patient denies increased energy with decreased need for sleep, increased talkativeness, racing thoughts, impulsivity or risky behaviors, increased spending, increased libido, grandiosity, increased irritability or anger, paranoia, or hallucinations.  Denies dizziness, syncope, seizures, tingling, tremor, tics, unsteady gait, slurred speech, confusion. Has chronic diffuse muscle pain, also lumbar pain with rad to legs, seeing pain management and ortho. Continues to f/u w/ GYN, Pulmonology, Cardiology.  Individual Medical History/ Review of Systems: Changes? :Yes   see HPI and notes on chart    Past Psychiatric History:    She voluntarily committed herself in 2004 and 3 times since then. For depression. Never attempted suicide.    Past medications for mental health diagnoses include: Prozac didn't work, Effexor, Wellbutrin, Seroquel, Vraylar, Paxil, Zoloft, Cymbalta, Pristiq, Klonopin, Xanax, Trazodone, Ambien, Lunesta, Spravato started 10/2021, insurance will not pay for it, Lamictal didn't help but took it for a long time, Adderall, Lyrica caused crying   Never had ECT,  TMS  Allergies: Ibuprofen, Neomycin-bacitracin-polymyxin  [bacitracin-neomycin-polymyxin], Quinolones, Atorvastatin, Bacitracin-polymyxin b, Bactroban [mupirocin], Benzalkonium chloride, Cefprozil, Cephalexin, Ciprofloxacin, Crestor [rosuvastatin], Lidocaine, Mederma, Monistat [miconazole], Neomycin-bacitracin zn-polymyx, Polyoxyethylene 40 sorbitol septaoleate [sorbitan], Prednisone, Quinine, Septra [sulfamethoxazole-trimethoprim], Tioconazole, Valtrex [valacyclovir hcl], and Adhesive [tape]  Current Medications:  Current Outpatient Medications:    amphetamine-dextroamphetamine (ADDERALL XR) 30 MG 24 hr capsule, Take 1 capsule (30 mg total) by mouth daily as needed. Take sparingly and not with Klonopin, Disp: 30 capsule, Rfl: 0   bupivacaine (MARCAINE) 0.5 % injection, 15 mLs daily as needed. Pain in bladder, Disp: , Rfl:    buPROPion (WELLBUTRIN XL) 150 MG 24 hr tablet, Take 1 tablet (150 mg total) by mouth daily. Takes w/ 300 mg daily, Disp: 90 tablet, Rfl: 0   buPROPion (WELLBUTRIN XL) 300 MG 24 hr tablet, TAKE 1 TABLET(300 MG) BY MOUTH EVERY MORNING, Disp: 90 tablet, Rfl: 1   cariprazine (VRAYLAR) 3 MG capsule, Take 1 capsule (3 mg total) by mouth daily., Disp: 30 capsule, Rfl: 1   clindamycin (CLEOCIN T) 1 % lotion, , Disp: , Rfl:    clonazePAM (KLONOPIN) 1 MG tablet, TAKE 1 TABLET(1 MG) BY MOUTH THREE TIMES DAILY AS NEEDED FOR ANXIETY, Disp: 90 tablet, Rfl: 0   ELMIRON 100 MG capsule, Take 100 mg by mouth in the morning and at bedtime. Takes per bladder, Disp: , Rfl:    estradiol (VIVELLE-DOT) 0.0375 MG/24HR, Place 1 patch onto the skin 2 (two) times a week., Disp: 24 patch, Rfl: 3   HYDROcodone-acetaminophen (NORCO) 10-325 MG tablet, Take 1 tablet 3 times a day by oral route as needed for pain for 30 days., Disp: , Rfl:  hydrOXYzine (ATARAX/VISTARIL) 25 MG tablet, Take 50 mg by mouth at bedtime. , Disp: , Rfl:    methocarbamol (ROBAXIN) 500 MG tablet, Take 500 mg by mouth as needed., Disp:  , Rfl:    mometasone (ELOCON) 0.1 % ointment, Apply topically twice weekly., Disp: 45 g, Rfl: 0   nitrofurantoin (MACRODANTIN) 50 MG capsule, Take 50 mg by mouth daily., Disp: , Rfl:    pantoprazole (PROTONIX) 40 MG tablet, Take 40 mg by mouth daily., Disp: , Rfl:    progesterone (PROMETRIUM) 100 MG capsule, Take 1 capsule (100 mg total) by mouth daily., Disp: 30 capsule, Rfl: 12   traZODone (DESYREL) 100 MG tablet, Take 1 tablet (100 mg total) by mouth at bedtime., Disp: 90 tablet, Rfl: 5   Cholecalciferol 100 MCG (4000 UT) CAPS, Take by mouth. (Patient not taking: Reported on 10/05/2022), Disp: , Rfl:    clobetasol ointment (TEMOVATE) 0.05 %, APPLY SMALL AMOUNT TOPICALLY TO THE AFFECTED AREA 2 TIMES A WEEK (Patient not taking: Reported on 10/05/2022), Disp: 60 g, Rfl: 0   doxycycline (MONODOX) 50 MG capsule, , Disp: , Rfl:    fluticasone-salmeterol (ADVAIR HFA) 115-21 MCG/ACT inhaler, Inhale 2 puffs into the lungs 2 (two) times daily. (Patient not taking: Reported on 10/05/2022), Disp: 1 each, Rfl: 3   pregabalin (LYRICA) 75 MG capsule, Take 1 tablet in AM and 2 tablets in PM (Patient not taking: Reported on 11/01/2022), Disp: 90 capsule, Rfl: 2   UNABLE TO FIND, Med Name: Valium Suppositories per pt (Patient not taking: Reported on 10/05/2022), Disp: , Rfl:  Medication Side Effects: none  Family Medical/ Social History: Changes?  No  MENTAL HEALTH EXAM:  There were no vitals taken for this visit.There is no height or weight on file to calculate BMI.  General Appearance: Casual and Well Groomed  Eye Contact:  Good  Speech:  Clear and Coherent and Normal Rate  Volume:  Normal  Mood:   Sad  Affect:  Congruent  Thought Process:  Goal Directed and Descriptions of Associations: Circumstantial  Orientation:  Full (Time, Place, and Person)  Thought Content: Logical   Suicidal Thoughts:  No  Homicidal Thoughts:  No  Memory:  WNL  Judgement:  Good  Insight:  Good  Psychomotor Activity:  Wide  Base and getting up from a seated position is difficult and slow, she also walks slowly  Concentration:  Concentration: Good  Recall:  Good  Fund of Knowledge: Good  Language: Good  Assets:  Desire for Improvement Financial Resources/Insurance Housing Resilience Transportation  ADL's:  Intact  Cognition: WNL  Prognosis:  Good   DIAGNOSES:    ICD-10-CM   1. Bipolar I disorder, most recent episode depressed (HCC)  F31.30     2. Social anxiety disorder  F40.10     3. Insomnia due to other mental disorder  F51.05    F99     4. Multiple painful somatic complaints  R68.89       Receiving Psychotherapy: Yes   with Dr. [name]  RECOMMENDATIONS:  PDMP reviewed.  Klonopin filled 10/17/2022.  Also on Lyrica and hydrocodone known to me. I provided 20 minutes of face to face time during this encounter, including time spent before and after the visit in records review, medical decision making, counseling pertinent to today's visit, and charting.   I think most of her symptoms are likely situational, however she is on the lowest dose of Vraylar and I recommend increasing it.  She would like to  do so.  Continue Adderall XR 30 mg, 1 p.o. every morning as needed, take sparingly and not with Klonopin. Cont Wellbutrin XL 450 mg daily. Increase Vraylar to 3 mg daily.  Continue Klonopin 1 mg, 1 po tid prn.  (She reports taking only at night.) Cont Hydroxyzine 25 mg, 2 po qhs.  Continue Trazodone 100 mg, 1/2-1 qhs prn.  Continue therapy with Dr. [name]. Return in 4-6 weeks.  Melony Overly, PA-C

## 2022-11-02 LAB — CK: Total CK: 80 U/L (ref 29–143)

## 2022-11-03 LAB — CYCLIC CITRUL PEPTIDE ANTIBODY, IGG: Cyclic Citrullin Peptide Ab: <16 UNITS

## 2022-11-03 LAB — C-REACTIVE PROTEIN: CRP: 3 mg/L (ref <8.0)

## 2022-11-04 ENCOUNTER — Telehealth: Payer: Self-pay

## 2022-11-04 LAB — SEDIMENTATION RATE: Sed Rate: 9 mm/h (ref 0–30)

## 2022-11-04 NOTE — Telephone Encounter (Signed)
Patient called in regards to labs from 11/01/2022 that have resulted. Patient would like to know what the next step would be for her based on the lab results, if she should follow up with you or what the plan could be?  I advised patient there is still two test results pending as of right now but I would get a message to Dr. Dimple Casey to advise pending all lab results. Patient verbalized understanding and expressed thanks to you for your help in her case.

## 2022-11-05 LAB — MUTATED CITRULLINATED VIMENTIN (MCV) ANTIBODY: MUTATED CITRULLINATED VIMENTIN (MCV) AB: <20 U/mL (ref <20)

## 2022-11-05 LAB — RHEUMATOID FACTOR: Rheumatoid fact SerPl-aCnc: 10 IU/mL (ref <14)

## 2022-11-05 LAB — ANA: Anti Nuclear Antibody (ANA): NEGATIVE

## 2022-11-08 ENCOUNTER — Ambulatory Visit
Admission: RE | Admit: 2022-11-08 | Discharge: 2022-11-08 | Disposition: A | Discharge status: Home / Self Care | Payer: BC Managed Care – PPO | Source: Ambulatory Visit | Attending: Obstetrics & Gynecology | Admitting: Obstetrics & Gynecology

## 2022-11-08 DIAGNOSIS — R1031 Right lower quadrant pain: Secondary | ICD-10-CM

## 2022-11-08 DIAGNOSIS — R1903 Right lower quadrant abdominal swelling, mass and lump: Secondary | ICD-10-CM

## 2022-11-08 DIAGNOSIS — Z1231 Encounter for screening mammogram for malignant neoplasm of breast: Secondary | ICD-10-CM

## 2022-11-08 MED ORDER — IOPAMIDOL (ISOVUE-300) INJECTION 61%
100.0000 mL | Freq: Once | INTRAVENOUS | Status: AC | PRN
  Administered 2022-11-08: 100 mL via INTRAVENOUS

## 2022-11-09 ENCOUNTER — Telehealth: Payer: Self-pay | Admitting: Internal Medicine

## 2022-11-09 ENCOUNTER — Ambulatory Visit: Payer: BC Managed Care – PPO | Admitting: Internal Medicine

## 2022-11-09 NOTE — Telephone Encounter (Signed)
Patient is aware 

## 2022-11-09 NOTE — Telephone Encounter (Signed)
Pt drank 2 bottles of a white liquid barium for IV contrast, and she is wondering if this will affect the blood work she is having tomorrow morning?  Please advise.

## 2022-11-10 ENCOUNTER — Other Ambulatory Visit (INDEPENDENT_AMBULATORY_CARE_PROVIDER_SITE_OTHER): Payer: BC Managed Care – PPO

## 2022-11-10 DIAGNOSIS — E782 Mixed hyperlipidemia: Secondary | ICD-10-CM | POA: Diagnosis not present

## 2022-11-10 LAB — LIPID PANEL
Cholesterol: 225 mg/dL — ABNORMAL HIGH (ref 0–200)
HDL: 36.4 mg/dL — ABNORMAL LOW (ref >39.00)
NonHDL: 188.91
Total CHOL/HDL Ratio: 6
Triglycerides: 367 mg/dL — ABNORMAL HIGH (ref 0.0–149.0)
VLDL: 73.4 mg/dL — ABNORMAL HIGH (ref 0.0–40.0)

## 2022-11-10 LAB — LDL CHOLESTEROL, DIRECT: Direct LDL: 129 mg/dL

## 2022-11-11 ENCOUNTER — Telehealth: Payer: Self-pay | Admitting: Internal Medicine

## 2022-11-11 NOTE — Telephone Encounter (Signed)
Patient called stating she never received a call to discuss the results of her labwork that Dr. Dimple Casey ordered.  Patient was very upset and feels that Dr. Dimple Casey doesn't want to help her.  Patient states she has bursitis all over her body and is tired of being in pain.  Patient is not sure how to proceed and requested a return call.

## 2022-11-13 ENCOUNTER — Other Ambulatory Visit: Payer: Self-pay | Admitting: Physician Assistant

## 2022-11-13 NOTE — Telephone Encounter (Signed)
Due 7/2

## 2022-11-20 ENCOUNTER — Encounter: Payer: Self-pay | Admitting: Internal Medicine

## 2022-11-21 NOTE — Telephone Encounter (Signed)
Patient had labs done on 06/21/2022 and 11/01/2022. Please advise.

## 2022-11-22 ENCOUNTER — Telehealth: Payer: Self-pay | Admitting: *Deleted

## 2022-11-22 DIAGNOSIS — M2550 Pain in unspecified joint: Secondary | ICD-10-CM

## 2022-11-22 DIAGNOSIS — M719 Bursopathy, unspecified: Secondary | ICD-10-CM

## 2022-11-22 MED ORDER — SULFASALAZINE 500 MG PO TABS
ORAL_TABLET | ORAL | 0 refills | Status: DC
Start: 2022-11-22 — End: 2022-12-27

## 2022-11-22 NOTE — Telephone Encounter (Signed)
Patient states she is agreeable to try the SSZ. Patient would like the prescription sent the [facility] on [address].

## 2022-11-22 NOTE — Telephone Encounter (Signed)
Sent Rx to [facility]. Start with 500 mg 1 tablet twice daily for 2 weeks then can increase to 2 tablets twice daily if tolerating.  Please schedule her follow up to be in 1 month to recheck symptoms and labs for drug monitoring.

## 2022-11-22 NOTE — Telephone Encounter (Signed)
Please advise 

## 2022-11-22 NOTE — Telephone Encounter (Signed)
Attempted to contact the patient and left message for patient to call the office.  

## 2022-11-22 NOTE — Telephone Encounter (Signed)
Sulfasalazine is not commonly a cause of kidney or liver toxicity. It can affect the liver in a small percentage of patients, and we would monitor for this by checking her blood test in a month after starting it. The most common side effect is usually nausea or diarrhea. It can also cause rashes especially in patients with a history of sulfa antibiotics allergy so that is something she should let us know about it it occurs.

## 2022-11-22 NOTE — Addendum Note (Signed)
Addended by: Fuller Plan on: 11/22/2022 03:23 PM   Modules accepted: Orders

## 2022-11-22 NOTE — Telephone Encounter (Signed)
Patient is wants to make sure SSZ will not damage her kidney and/or liver before taking meds. Please advise.

## 2022-11-23 ENCOUNTER — Telehealth: Payer: Self-pay

## 2022-11-23 NOTE — Telephone Encounter (Signed)
Attempted to contact the patient and left a message to call the office back. 

## 2022-11-23 NOTE — Telephone Encounter (Signed)
Patient advised Sulfasalazine is not commonly a cause of kidney or liver toxicity. Patient advised it can affect the liver in a small percentage of patients, and we would monitor for this by checking her blood test in a month after starting it. Patient advised the most common side effect is usually nausea or diarrhea. It can also cause rashes especially in patients with a history of sulfa antibiotics allergy so that is something she should let us know about it it occurs. Patient expressed understanding. Patient has picked up prescription from the pharmacy. Patient has scheduled her follow up visit for 12/27/2022 at 3:00 pm.

## 2022-11-23 NOTE — Telephone Encounter (Signed)
Pt LVM in triage line requesting refills on estrogen patches and progesterone.  Per EMR; Refills for progesterone sent in to pharmacy for 70yr on 05/31/2022 and refills for patches were sent for 32yr by alternate provider on 10/03/2022.   Pt advised to reach out to pharmacy when refills are needed. Pt voiced understanding and stated that she apologized that she called anyways since she has been recently taking hydrocodone and her mind has not been "quite right." She stated that she actually meant to call Dr. Rondel Baton office instead of this one to inquire.   Will route to provider for final review and close.

## 2022-11-23 NOTE — Telephone Encounter (Signed)
See previous phone note for additional details 

## 2022-11-29 ENCOUNTER — Encounter: Payer: Self-pay | Admitting: Physician Assistant

## 2022-11-29 ENCOUNTER — Ambulatory Visit (INDEPENDENT_AMBULATORY_CARE_PROVIDER_SITE_OTHER): Payer: BC Managed Care – PPO | Admitting: Physician Assistant

## 2022-11-29 DIAGNOSIS — F5105 Insomnia due to other mental disorder: Secondary | ICD-10-CM | POA: Diagnosis not present

## 2022-11-29 DIAGNOSIS — R6889 Other general symptoms and signs: Secondary | ICD-10-CM | POA: Diagnosis not present

## 2022-11-29 DIAGNOSIS — F99 Mental disorder, not otherwise specified: Secondary | ICD-10-CM

## 2022-11-29 DIAGNOSIS — F3130 Bipolar disorder, current episode depressed, mild or moderate severity, unspecified: Secondary | ICD-10-CM

## 2022-11-29 DIAGNOSIS — G894 Chronic pain syndrome: Secondary | ICD-10-CM

## 2022-11-29 DIAGNOSIS — F4310 Post-traumatic stress disorder, unspecified: Secondary | ICD-10-CM

## 2022-11-29 DIAGNOSIS — I2510 Atherosclerotic heart disease of native coronary artery without angina pectoris: Secondary | ICD-10-CM

## 2022-11-29 DIAGNOSIS — F4010 Social phobia, unspecified: Secondary | ICD-10-CM

## 2022-11-29 HISTORY — DX: Atherosclerotic heart disease of native coronary artery without angina pectoris: I25.10

## 2022-11-29 MED ORDER — BUPROPION HCL ER (XL) 300 MG PO TB24: 300.0000 mg | ORAL_TABLET | Freq: Every day | ORAL | 1 refills | Status: DC

## 2022-11-29 MED ORDER — BUPROPION HCL ER (XL) 150 MG PO TB24: 150.0000 mg | ORAL_TABLET | Freq: Every day | ORAL | 0 refills | Status: DC

## 2022-11-29 NOTE — Progress Notes (Unsigned)
Cardiology Office Note:    Date:  11/30/2022  ID:  Erika Mccoy, DOB 01-15-1966, MRN [id number] PCP: Philip Aspen, Limmie Patricia, MD  [facility] Providers Cardiologist:  Verne Carrow, MD       Patient Profile:      Coronary artery disease CCTA 06/03/21: CAC score 0, pLAD 25-49 (noncalcified) TTE 06/08/21: EF 58, no RWMA, NL RVSF, trivial AI, RAP 3 Hyperlipidemia  Palpitations  Monitor 04/2022: NSR Anxiety/Depression Fibromyalgia Gastritis  IBS PTSD       History of Present Illness:   Discussed the use of AI scribe software for clinical note transcription with the patient, who gave verbal consent to proceed.   History of Present Illness   The patient, a 57 year old female with a history of CAD and hyperlipidemia, presents for follow-up. She was last seen by her cardiologist in November 2023 and her primary care doctor in May 2024. The patient's statin, rosuvastatin 20mg , was stopped due to side effects of myalgia. The patient's labs in June 2024 showed elevated total cholesterol at 225, low HDL at 36.4, elevated direct LDL at 129, and elevated triglycerides at 367. The patient has a history of syncope. She has a family history of high triglycerides and high cholesterol. The patient has tried atorvastatin and rosuvastatin, both of which caused her body to ache like she had the flu. These symptoms resolved upon discontinuation of the medications. The patient also has a history of fibromyalgia and is currently being treated for an inflammatory condition. She reports that all her joints hurt and she has swelling in various places. The patient also reports swelling in her ankles by the end of the day, but this resolves overnight. She has had some L sided chest pain related to breast augmentation surgery that is chronic. She has not had shortness of breath.     ROS: See HPI    Studies Reviewed:       Risk Assessment/Calculations:             Physical Exam:   VS:  BP  104/60   Pulse 98   Ht 5\' 5"  (1.651 m)   Wt 165 lb (74.8 kg)   SpO2 96%   BMI 27.46 kg/m    Wt Readings from Last 3 Encounters:  11/30/22 165 lb (74.8 kg)  11/01/22 169 lb (76.7 kg)  10/21/22 173 lb 3.2 oz (78.6 kg)    Physical Exam   GEN: Well developed, well nourished, no acute distress NECK: no JVD CARDIAC: normal S1, S2; reg rate and rhythm, no murmur CHEST: clear to auscultation bilaterally ABDOMEN: soft EXTREMITIES: without pitting edema         ASSESSMENT AND PLAN:   Hyperlipidemia Elevated total cholesterol, LDL, and triglycerides. History of intolerance to atorvastatin and rosuvastatin due to myalgias. Discussed the mechanism and potential benefits of PCSK9 inhibitors, but insurance may require trial of another medication first. -Start Zetia 10mg  daily. -If intolerant, refer to lipid clinic for potential PCSK9 inhibitor therapy. -Order fasting lipid panel and liver function tests in three months to assess response to therapy.  CAD (coronary artery disease) No current symptoms of chest pain or shortness of breath. History of syncope episodes in 2019 and 2023, but cardiac evaluation was normal. Unable to tolerate aspirin due to gastritis. Unable to tolerate statin due to myalgias. -No changes to current management.  Fibromyalgia Reports widespread joint pain and swelling, currently being treated with sulfasalazine by rheumatologist. -No changes to current management, continue care with rheumatologist.  Dispo:  Return in about 4 months (around 04/02/2023) for Routine Follow Up w/ Dr. Clifton James, or Tereso Newcomer, PA-C.  Signed, Tereso Newcomer, PA-C

## 2022-11-29 NOTE — Progress Notes (Signed)
[facility]  Patient ID: Alvin Rubano,  MRN: [id number]  PCP: Philip Aspen, Limmie Patricia, MD  Date of Evaluation: 11/29/2022 Time spent:25 minutes  Chief Complaint:  Chief Complaint   Depression; Anxiety; Follow-up   Virtual Visit via Telehealth  I connected with patient by telephone, with their informed consent, and verified patient privacy and that I am speaking with the correct person using two identifiers.  I am private, in my office and the patient is at home.  I discussed the limitations, risks, security and privacy concerns of performing an evaluation and management service by telephone and the availability of in person appointments. I also discussed with the patient that there may be a patient responsible charge related to this service. The patient expressed understanding and agreed to proceed.   I discussed the assessment and treatment plan with the patient. The patient was provided an opportunity to ask questions and all were answered. The patient agreed with the plan and demonstrated an understanding of the instructions.   The patient was advised to call back or seek an in-person evaluation if the symptoms worsen or if the condition fails to improve as anticipated.  I provided 25 minutes of non-face-to-face time during this encounter.  HISTORY/CURRENT STATUS: HPI  for routine med check.  Is being treated for RA. In a lot of pain. Has been started on Sulfasalazine for that and her stomach. She and husband are moving and 'it's too much. RA is a hateful disease.' Has hurt her back even more by lifting boxes.  Is anxious in general sometimes.  It is that all of her joints hurt.  Is miserable.  Even though she feels really bad she does not want to change any medications.  "I am getting scared of medicines to tell you the truth."  She is not really enjoying much of anything, but it is because of her physical health.  She does not sleep well because it hurts when she  turns over and that disturbs her sleep.  She cries easily when the pain is really bad or when she is thinking too much about everything.  Personal hygiene is normal.  Appetite is normal and weight is stable.  She is unable to work.  No suicidal or homicidal thoughts.  Patient denies increased energy with decreased need for sleep, increased talkativeness, racing thoughts, impulsivity or risky behaviors, increased spending, increased libido, grandiosity, increased irritability or anger, paranoia, or hallucinations.  Denies dizziness, syncope, seizures, tingling, tremor, tics, unsteady gait, slurred speech, confusion. Has chronic diffuse muscle pain, also lumbar pain with rad to legs, seeing pain management and ortho. Continues to f/u w/ GYN, Pulmonology, Cardiology.  Individual Medical History/ Review of Systems: Changes? :Yes   see HPI and records on chart  Past Psychiatric History:    She voluntarily committed herself in 2004 and 3 times since then. For depression. Never attempted suicide.    Past medications for mental health diagnoses include: Prozac didn't work, Effexor, Wellbutrin, Seroquel, Vraylar, Paxil, Zoloft, Cymbalta, Pristiq, Klonopin, Xanax, Trazodone, Ambien, Lunesta, Spravato started 10/2021, insurance will not pay for it, Lamictal didn't help but took it for a long time, Adderall, Lyrica caused crying   Never had ECT, TMS  Allergies: Ibuprofen, Neomycin-bacitracin-polymyxin  [bacitracin-neomycin-polymyxin], Quinolones, Atorvastatin, Bacitracin-polymyxin b, Bactroban [mupirocin], Benzalkonium chloride, Cefprozil, Cephalexin, Ciprofloxacin, Crestor [rosuvastatin], Lidocaine, Mederma, Monistat [miconazole], Neomycin-bacitracin zn-polymyx, Polyoxyethylene 40 sorbitol septaoleate [sorbitan], Prednisone, Quinine, Septra [sulfamethoxazole-trimethoprim], Tioconazole, Valtrex [valacyclovir hcl], and Adhesive [tape]  Current Medications:  Current Outpatient Medications:  amphetamine-dextroamphetamine (ADDERALL XR) 30 MG 24 hr capsule, Take 1 capsule (30 mg total) by mouth daily as needed. Take sparingly and not with Klonopin, Disp: 30 capsule, Rfl: 0   cariprazine (VRAYLAR) 3 MG capsule, Take 1 capsule (3 mg total) by mouth daily., Disp: 30 capsule, Rfl: 1   clindamycin (CLEOCIN T) 1 % lotion, , Disp: , Rfl:    clonazePAM (KLONOPIN) 1 MG tablet, TAKE 1 TABLET(1 MG) BY MOUTH THREE TIMES DAILY AS NEEDED FOR ANXIETY, Disp: 90 tablet, Rfl: 0   estradiol (VIVELLE-DOT) 0.0375 MG/24HR, Place 1 patch onto the skin 2 (two) times a week., Disp: 24 patch, Rfl: 3   fluticasone-salmeterol (ADVAIR HFA) 115-21 MCG/ACT inhaler, Inhale 2 puffs into the lungs 2 (two) times daily., Disp: 1 each, Rfl: 3   HYDROcodone-acetaminophen (NORCO) 10-325 MG tablet, Take 1 tablet 3 times a day by oral route as needed for pain for 30 days., Disp: , Rfl:    hydrOXYzine (ATARAX/VISTARIL) 25 MG tablet, Take 50 mg by mouth at bedtime. , Disp: , Rfl:    methocarbamol (ROBAXIN) 500 MG tablet, Take 500 mg by mouth as needed., Disp: , Rfl:    nitrofurantoin (MACRODANTIN) 50 MG capsule, Take 50 mg by mouth daily., Disp: , Rfl:    pantoprazole (PROTONIX) 40 MG tablet, Take 40 mg by mouth daily., Disp: , Rfl:    progesterone (PROMETRIUM) 100 MG capsule, Take 1 capsule (100 mg total) by mouth daily., Disp: 30 capsule, Rfl: 12   sulfaSALAzine (AZULFIDINE) 500 MG tablet, Take 1 tablet (500 mg total) by mouth 2 (two) times daily for 14 days, THEN 2 tablets (1,000 mg total) 2 (two) times daily for 21 days., Disp: 120 tablet, Rfl: 0   traZODone (DESYREL) 100 MG tablet, Take 1 tablet (100 mg total) by mouth at bedtime., Disp: 90 tablet, Rfl: 5   bupivacaine (MARCAINE) 0.5 % injection, 15 mLs daily as needed. Pain in bladder (Patient not taking: Reported on 11/01/2022), Disp: , Rfl:    buPROPion (WELLBUTRIN XL) 150 MG 24 hr tablet, Take 1 tablet (150 mg total) by mouth daily. Takes w/ 300 mg daily, Disp: 90 tablet,  Rfl: 0   buPROPion (WELLBUTRIN XL) 300 MG 24 hr tablet, Take 1 tablet (300 mg total) by mouth daily., Disp: 90 tablet, Rfl: 1   Cholecalciferol 100 MCG (4000 UT) CAPS, Take by mouth. (Patient not taking: Reported on 10/05/2022), Disp: , Rfl:    clobetasol ointment (TEMOVATE) 0.05 %, APPLY SMALL AMOUNT TOPICALLY TO THE AFFECTED AREA 2 TIMES A WEEK (Patient not taking: Reported on 10/05/2022), Disp: 60 g, Rfl: 0   doxycycline (MONODOX) 50 MG capsule, , Disp: , Rfl:    ELMIRON 100 MG capsule, Take 100 mg by mouth in the morning and at bedtime. Takes per bladder (Patient not taking: Reported on 11/01/2022), Disp: , Rfl:    mometasone (ELOCON) 0.1 % ointment, Apply topically twice weekly. (Patient not taking: Reported on 11/01/2022), Disp: 45 g, Rfl: 0   pregabalin (LYRICA) 75 MG capsule, Take 1 tablet in AM and 2 tablets in PM (Patient not taking: Reported on 11/01/2022), Disp: 90 capsule, Rfl: 2   UNABLE TO FIND, Med Name: Valium Suppositories per pt (Patient not taking: Reported on 10/05/2022), Disp: , Rfl:  Medication Side Effects: none  Family Medical/ Social History: Changes?  No  MENTAL HEALTH EXAM:  There were no vitals taken for this visit.There is no height or weight on file to calculate BMI.  General Appearance:  Unable to  assess  Eye Contact:   Unable to assess  Speech:  Clear and Coherent and Normal Rate  Volume:  Normal  Mood:  Depressed  Affect:   Unable to assess  Thought Process:  Goal Directed and Descriptions of Associations: Circumstantial  Orientation:  Full (Time, Place, and Person)  Thought Content: Logical   Suicidal Thoughts:  No  Homicidal Thoughts:  No  Memory:  WNL  Judgement:  Good  Insight:  Good  Psychomotor Activity: Unable to assess  Concentration:  Concentration: Good  Recall:  Good  Fund of Knowledge: Good  Language: Good  Assets:  Desire for Improvement Financial Resources/Insurance Housing Resilience Transportation  ADL's:  Intact  Cognition: WNL   Prognosis:  Good   DIAGNOSES:    ICD-10-CM   1. Bipolar I disorder, most recent episode depressed (HCC)  F31.30     2. Social anxiety disorder  F40.10     3. Insomnia due to other mental disorder  F51.05    F99     4. Multiple painful somatic complaints  R68.89     5. PTSD (post-traumatic stress disorder)  F43.10     6. Chronic pain syndrome  G89.4       Receiving Psychotherapy: Yes   with Dr. [name]  RECOMMENDATIONS:  PDMP reviewed.  Klonopin filled 11/18/2022.   I provided 25 minutes of non-face-to-face time during this encounter, including time spent before and after the visit in records review, medical decision making, counseling pertinent to today's visit, and charting.   I am sorry to hear about her physical health issues and hope that the new medication helps her quickly. We agreed to leave all meds and doses the same for now and see how she is doing in a few months after the sulfasalazine has kicked in and she has moved.  Continue Adderall XR 30 mg, 1 p.o. every morning as needed, take sparingly and not with Klonopin. Cont Wellbutrin XL 450 mg daily. Continue Vraylar 3 mg daily.  Continue Klonopin 1 mg, 1 po tid prn.  (She reports taking only at night.) Cont Hydroxyzine 25 mg, 2 po qhs.  Continue Trazodone 100 mg, 1/2-1 qhs prn.  Continue therapy with Dr. [name]. Return in 2 months.  Melony Overly, PA-C

## 2022-11-30 ENCOUNTER — Encounter: Payer: Self-pay | Admitting: Physician Assistant

## 2022-11-30 ENCOUNTER — Ambulatory Visit: Payer: BC Managed Care – PPO | Attending: Physician Assistant | Admitting: Physician Assistant

## 2022-11-30 VITALS — BP 104/60 | HR 98 | Ht 65.0 in | Wt 165.0 lb

## 2022-11-30 DIAGNOSIS — M797 Fibromyalgia: Secondary | ICD-10-CM

## 2022-11-30 DIAGNOSIS — E782 Mixed hyperlipidemia: Secondary | ICD-10-CM | POA: Diagnosis not present

## 2022-11-30 DIAGNOSIS — I2510 Atherosclerotic heart disease of native coronary artery without angina pectoris: Secondary | ICD-10-CM

## 2022-11-30 MED ORDER — EZETIMIBE 10 MG PO TABS: 10.0000 mg | ORAL_TABLET | Freq: Every day | ORAL | 3 refills | Status: DC

## 2022-11-30 NOTE — Assessment & Plan Note (Signed)
Reports widespread joint pain and swelling, currently being treated with sulfasalazine by rheumatologist. -No changes to current management, continue care with rheumatologist.

## 2022-11-30 NOTE — Assessment & Plan Note (Signed)
Elevated total cholesterol, LDL, and triglycerides. History of intolerance to atorvastatin and rosuvastatin due to myalgias. Discussed the mechanism and potential benefits of PCSK9 inhibitors, but insurance may require trial of another medication first. -Start Zetia 10mg  daily. -If intolerant, refer to lipid clinic for potential PCSK9 inhibitor therapy. -Order fasting lipid panel and liver function tests in three months to assess response to therapy.

## 2022-11-30 NOTE — Assessment & Plan Note (Signed)
No current symptoms of chest pain or shortness of breath. History of syncope episodes in 2019 and 2023, but cardiac evaluation was normal. Unable to tolerate aspirin due to gastritis. Unable to tolerate statin due to myalgias. -No changes to current management.

## 2022-11-30 NOTE — Patient Instructions (Addendum)
Medication Instructions:  Your physician has recommended you make the following change in your medication:   START Zetia 10 mg taking 1 daily  *If you need a refill on your cardiac medications before your next appointment, please call your pharmacy*   Lab Work: AT YOUR NEXT APPOINTMENT COME FASTING SO WE CAN GET A  LIPID & LFT  If you have labs (blood work) drawn today and your tests are completely normal, you will receive your results only by: MyChart Message (if you have MyChart) OR A paper copy in the mail If you have any lab test that is abnormal or we need to change your treatment, we will call you to review the results.   Testing/Procedures: None ordered   Follow-Up: At [facility], you and your health needs are our priority.  As part of our continuing mission to provide you with exceptional heart care, we have created designated Provider Care Teams.  These Care Teams include your primary Cardiologist (physician) and Advanced Practice Providers (APPs -  Physician Assistants and Nurse Practitioners) who all work together to provide you with the care you need, when you need it.  We recommend signing up for the patient portal called "MyChart".  Sign up information is provided on this After Visit Summary.  MyChart is used to connect with patients for Virtual Visits (Telemedicine).  Patients are able to view lab/test results, encounter notes, upcoming appointments, etc.  Non-urgent messages can be sent to your provider as well.   To learn more about what you can do with MyChart, go to [email or web address].    Your next appointment:   As planned   Provider:   Verne Carrow, MD     Other Instructions  If you have any side effects from the Zetia, please let us know.

## 2022-12-03 ENCOUNTER — Encounter: Payer: Self-pay | Admitting: Internal Medicine

## 2022-12-08 ENCOUNTER — Other Ambulatory Visit: Payer: Self-pay | Admitting: Pulmonary Disease

## 2022-12-08 ENCOUNTER — Encounter: Payer: Self-pay | Admitting: Internal Medicine

## 2022-12-08 DIAGNOSIS — R053 Chronic cough: Secondary | ICD-10-CM
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
## 2022-12-19 ENCOUNTER — Telehealth: Payer: Self-pay | Admitting: Physician Assistant

## 2022-12-19 ENCOUNTER — Other Ambulatory Visit: Payer: Self-pay

## 2022-12-19 MED ORDER — CLONAZEPAM 1 MG PO TABS: ORAL_TABLET | ORAL | 0 refills | Status: DC

## 2022-12-19 NOTE — Telephone Encounter (Signed)
Pt LVM @ 10:07a requesting refill of Clonazepam to [address]  She said she takes it with Wellbutrin and if she doesn't have it, she will have a seizure.   No upcoming appt scheduled.

## 2022-12-19 NOTE — Telephone Encounter (Signed)
Pended.

## 2022-12-23 ENCOUNTER — Ambulatory Visit (INDEPENDENT_AMBULATORY_CARE_PROVIDER_SITE_OTHER): Payer: BC Managed Care – PPO | Admitting: Psychiatry

## 2022-12-23 ENCOUNTER — Encounter: Payer: Self-pay | Admitting: Cardiovascular Disease

## 2022-12-23 DIAGNOSIS — Z634 Disappearance and death of family member: Secondary | ICD-10-CM

## 2022-12-23 DIAGNOSIS — F5104 Psychophysiologic insomnia: Secondary | ICD-10-CM

## 2022-12-23 DIAGNOSIS — F4320 Adjustment disorder, unspecified: Secondary | ICD-10-CM | POA: Diagnosis not present

## 2022-12-23 DIAGNOSIS — R69 Illness, unspecified: Secondary | ICD-10-CM

## 2022-12-23 DIAGNOSIS — F4010 Social phobia, unspecified: Secondary | ICD-10-CM

## 2022-12-23 DIAGNOSIS — E782 Mixed hyperlipidemia: Secondary | ICD-10-CM

## 2022-12-23 DIAGNOSIS — F4310 Post-traumatic stress disorder, unspecified: Secondary | ICD-10-CM | POA: Diagnosis not present

## 2022-12-23 DIAGNOSIS — F3130 Bipolar disorder, current episode depressed, mild or moderate severity, unspecified: Secondary | ICD-10-CM

## 2022-12-23 DIAGNOSIS — I2510 Atherosclerotic heart disease of native coronary artery without angina pectoris: Secondary | ICD-10-CM

## 2022-12-23 DIAGNOSIS — R6889 Other general symptoms and signs: Secondary | ICD-10-CM
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
## 2022-12-26 NOTE — Telephone Encounter (Signed)
Refer to PharmD Lipid clinic for consideration of PCSK9 inhib. Tereso Newcomer, PA-C    12/26/2022 5:42 PM

## 2022-12-27 ENCOUNTER — Encounter: Payer: Self-pay | Admitting: Internal Medicine

## 2022-12-27 ENCOUNTER — Ambulatory Visit: Payer: BC Managed Care – PPO | Attending: Internal Medicine | Admitting: Internal Medicine

## 2022-12-27 VITALS — BP 128/89 | HR 84 | Resp 14 | Ht 65.0 in | Wt 163.0 lb

## 2022-12-27 DIAGNOSIS — R768 Other specified abnormal immunological findings in serum: Secondary | ICD-10-CM | POA: Diagnosis not present

## 2022-12-27 DIAGNOSIS — M719 Bursopathy, unspecified: Secondary | ICD-10-CM

## 2022-12-27 DIAGNOSIS — M7052 Other bursitis of knee, left knee: Secondary | ICD-10-CM

## 2022-12-27 DIAGNOSIS — M2550 Pain in unspecified joint: Secondary | ICD-10-CM | POA: Diagnosis not present

## 2022-12-27 DIAGNOSIS — M7061 Trochanteric bursitis, right hip: Secondary | ICD-10-CM

## 2022-12-27 DIAGNOSIS — Z79899 Other long term (current) drug therapy: Secondary | ICD-10-CM | POA: Diagnosis not present

## 2022-12-27 DIAGNOSIS — R7689 Other specified abnormal immunological findings in serum: Secondary | ICD-10-CM

## 2022-12-27 DIAGNOSIS — M7062 Trochanteric bursitis, left hip: Secondary | ICD-10-CM

## 2022-12-27 DIAGNOSIS — M797 Fibromyalgia: Secondary | ICD-10-CM | POA: Diagnosis not present

## 2022-12-27 DIAGNOSIS — M7050 Other bursitis of knee, unspecified knee: Secondary | ICD-10-CM

## 2022-12-27 LAB — CBC WITH DIFFERENTIAL/PLATELET
Absolute Monocytes: 664 cells/uL (ref 200–950)
Basophils Absolute: 59 cells/uL (ref 0–200)
Basophils Relative: 0.7 %
Eosinophils Absolute: 143 cells/uL (ref 15–500)
Eosinophils Relative: 1.7 %
HCT: 41.3 % (ref 35.0–45.0)
Hemoglobin: 13.9 g/dL (ref 11.7–15.5)
Lymphs Abs: 2764 cells/uL (ref 850–3900)
MCH: 29.6 pg (ref 27.0–33.0)
MCHC: 33.7 g/dL (ref 32.0–36.0)
MCV: 87.9 fL (ref 80.0–100.0)
MPV: 10.1 fL (ref 7.5–12.5)
Monocytes Relative: 7.9 %
Neutro Abs: 4771 cells/uL (ref 1500–7800)
Neutrophils Relative %: 56.8 %
Platelets: 243 10*3/uL (ref 140–400)
RBC: 4.7 10*6/uL (ref 3.80–5.10)
RDW: 13.7 % (ref 11.0–15.0)
Total Lymphocyte: 32.9 %
WBC: 8.4 10*3/uL (ref 3.8–10.8)

## 2022-12-27 LAB — SEDIMENTATION RATE: Sed Rate: 9 mm/h (ref 0–30)

## 2022-12-27 MED ORDER — SULFASALAZINE 500 MG PO TABS: ORAL_TABLET | ORAL | 2 refills | Status: DC

## 2023-01-03 ENCOUNTER — Other Ambulatory Visit: Payer: Self-pay | Admitting: Physician Assistant

## 2023-01-04 ENCOUNTER — Other Ambulatory Visit: Payer: Self-pay | Admitting: *Deleted

## 2023-01-04 DIAGNOSIS — M7052 Other bursitis of knee, left knee: Secondary | ICD-10-CM

## 2023-01-04 DIAGNOSIS — M797 Fibromyalgia: Secondary | ICD-10-CM

## 2023-01-04 DIAGNOSIS — M719 Bursopathy, unspecified: Secondary | ICD-10-CM

## 2023-01-04 DIAGNOSIS — M7050 Other bursitis of knee, unspecified knee: Secondary | ICD-10-CM

## 2023-01-04 DIAGNOSIS — M7061 Trochanteric bursitis, right hip: Secondary | ICD-10-CM

## 2023-01-04 NOTE — Progress Notes (Signed)
Referral placed.

## 2023-01-09 ENCOUNTER — Other Ambulatory Visit (HOSPITAL_COMMUNITY): Payer: Self-pay

## 2023-01-09 ENCOUNTER — Encounter: Payer: Self-pay | Admitting: Pharmacist

## 2023-01-09 ENCOUNTER — Telehealth: Payer: Self-pay | Admitting: Pharmacist

## 2023-01-09 ENCOUNTER — Telehealth: Payer: Self-pay

## 2023-01-09 ENCOUNTER — Ambulatory Visit: Payer: BC Managed Care – PPO | Attending: Internal Medicine | Admitting: Pharmacist

## 2023-01-09 DIAGNOSIS — E782 Mixed hyperlipidemia: Secondary | ICD-10-CM

## 2023-01-09 NOTE — Telephone Encounter (Signed)
PA for Repatha submitted to [facility] Key: [id number]  Will ask tech team to run test claim to see if generic or brand vascepa needs PA
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
## 2023-01-09 NOTE — Telephone Encounter (Signed)
PA approved through 01/09/24

## 2023-01-09 NOTE — Assessment & Plan Note (Signed)
Assessment: LDL-C is not at goal of <70 (or <55) Intolerant to atorvastatin 20mg  and rosuvastatin 20mg  daily as well as ezetimibe. Overall does not appear diet is a big contributing factor (A1C 5.5) to TG elevation Per patient, family hx of elevated TG and LDL-C Limited exercise due to pain, but will be starting PT soon Discussed PSCK9i  Discussed alteratives options including rosuvastatin 5mg  twice a week  Discussed TG effect on CV risk and Vascepa data  Plan:  Will submit PA for Repatha and Vascepa

## 2023-01-09 NOTE — Telephone Encounter (Signed)
Pharmacy Patient Advocate Encounter  Insurance verification completed.    The patient is insured through [facility]   Ran test claim for VASCEPA. Per the pts plan Hyman Bible is the pref'd drug under the pts plan and does require a P/A     This test claim was processed through [facility]- copay amounts may vary at other pharmacies due to pharmacy/plan contracts, or as the patient moves through the different stages of their insurance plan.

## 2023-01-10 ENCOUNTER — Ambulatory Visit: Payer: BC Managed Care – PPO | Admitting: Physical Therapy

## 2023-01-10 ENCOUNTER — Encounter: Payer: Self-pay | Admitting: Physical Therapy

## 2023-01-10 ENCOUNTER — Other Ambulatory Visit: Payer: Self-pay

## 2023-01-10 DIAGNOSIS — M79604 Pain in right leg: Secondary | ICD-10-CM

## 2023-01-10 DIAGNOSIS — M79605 Pain in left leg: Secondary | ICD-10-CM | POA: Diagnosis not present

## 2023-01-10 DIAGNOSIS — R42 Dizziness and giddiness: Secondary | ICD-10-CM

## 2023-01-10 DIAGNOSIS — M6281 Muscle weakness (generalized): Secondary | ICD-10-CM | POA: Diagnosis not present

## 2023-01-10 DIAGNOSIS — R2681 Unsteadiness on feet: Secondary | ICD-10-CM | POA: Diagnosis not present

## 2023-01-10 DIAGNOSIS — R2689 Other abnormalities of gait and mobility: Secondary | ICD-10-CM

## 2023-01-10 DIAGNOSIS — Z7409 Other reduced mobility: Secondary | ICD-10-CM
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
## 2023-01-10 NOTE — Telephone Encounter (Signed)
Key: [id number] Sers PA submitted

## 2023-01-12 MED ORDER — REPATHA SURECLICK 140 MG/ML ~~LOC~~ SOAJ: 1.0000 mL | SUBCUTANEOUS | 11 refills | Status: DC

## 2023-01-12 NOTE — Telephone Encounter (Signed)
PA for Vascepa denied bc patient is not on a statin Pt made aware. Rx for Repatha sent to [facility] Will appeal vascepa denial. Pt to sign representative form She will get repeat labs at [facility] with Lorin Picket in Nov

## 2023-01-13 ENCOUNTER — Ambulatory Visit: Payer: BC Managed Care – PPO | Admitting: Psychiatry

## 2023-01-13 ENCOUNTER — Other Ambulatory Visit: Payer: Self-pay

## 2023-01-13 ENCOUNTER — Telehealth: Payer: Self-pay | Admitting: Physician Assistant

## 2023-01-13 NOTE — Telephone Encounter (Signed)
Pt called and is going to the beach. She will need refills of her adderall xr 30 mg and her klonopin 1 mg sent to the [facility]. The store number is [phone]. Adress is [address]

## 2023-01-13 NOTE — Telephone Encounter (Signed)
Pended to requested pharmacy.  

## 2023-01-13 NOTE — Telephone Encounter (Signed)
Appeals submitted 8/30

## 2023-01-17 ENCOUNTER — Other Ambulatory Visit: Payer: Self-pay | Admitting: Physician Assistant

## 2023-01-17 MED ORDER — AMPHETAMINE-DEXTROAMPHET ER 30 MG PO CP24: 30.0000 mg | ORAL_CAPSULE | Freq: Every day | ORAL | 0 refills | Status: DC | PRN

## 2023-01-17 MED ORDER — CLONAZEPAM 1 MG PO TABS: ORAL_TABLET | ORAL | 0 refills | Status: DC

## 2023-01-17 NOTE — Telephone Encounter (Signed)
Need to call pharmacy to clarify what they are asking.

## 2023-01-18 NOTE — Telephone Encounter (Signed)
We sent an Rx to a pharmacy in [facility]. They want a diagnosis for the Adderall.  She does have a diagnosis of ADHD and I am not sure why she is taking it.

## 2023-01-18 NOTE — Telephone Encounter (Signed)
Off label for depression

## 2023-01-24 ENCOUNTER — Encounter: Payer: BC Managed Care – PPO | Admitting: Physical Therapy

## 2023-01-27 ENCOUNTER — Encounter: Payer: Self-pay | Admitting: Physical Therapy

## 2023-01-27 ENCOUNTER — Ambulatory Visit (INDEPENDENT_AMBULATORY_CARE_PROVIDER_SITE_OTHER): Payer: BC Managed Care – PPO | Admitting: Physical Therapy

## 2023-01-27 DIAGNOSIS — M79604 Pain in right leg: Secondary | ICD-10-CM

## 2023-01-27 DIAGNOSIS — Z7409 Other reduced mobility: Secondary | ICD-10-CM

## 2023-01-27 DIAGNOSIS — M79605 Pain in left leg: Secondary | ICD-10-CM

## 2023-01-27 DIAGNOSIS — R42 Dizziness and giddiness: Secondary | ICD-10-CM

## 2023-01-27 DIAGNOSIS — M6281 Muscle weakness (generalized): Secondary | ICD-10-CM

## 2023-01-27 DIAGNOSIS — R2681 Unsteadiness on feet: Secondary | ICD-10-CM

## 2023-01-27 DIAGNOSIS — R2689 Other abnormalities of gait and mobility: Secondary | ICD-10-CM
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
## 2023-01-30 ENCOUNTER — Encounter: Payer: Self-pay | Admitting: Physician Assistant

## 2023-01-30 ENCOUNTER — Ambulatory Visit (INDEPENDENT_AMBULATORY_CARE_PROVIDER_SITE_OTHER): Payer: BC Managed Care – PPO | Admitting: Physician Assistant

## 2023-01-30 DIAGNOSIS — F5105 Insomnia due to other mental disorder: Secondary | ICD-10-CM | POA: Diagnosis not present

## 2023-01-30 DIAGNOSIS — R69 Illness, unspecified: Secondary | ICD-10-CM | POA: Diagnosis not present

## 2023-01-30 DIAGNOSIS — F4310 Post-traumatic stress disorder, unspecified: Secondary | ICD-10-CM | POA: Diagnosis not present

## 2023-01-30 DIAGNOSIS — F411 Generalized anxiety disorder: Secondary | ICD-10-CM

## 2023-01-30 DIAGNOSIS — F99 Mental disorder, not otherwise specified: Secondary | ICD-10-CM

## 2023-01-30 DIAGNOSIS — F319 Bipolar disorder, unspecified: Secondary | ICD-10-CM | POA: Diagnosis not present

## 2023-01-30 MED ORDER — CLONAZEPAM 1 MG PO TABS: ORAL_TABLET | ORAL | 5 refills | Status: DC

## 2023-01-30 MED ORDER — AMPHETAMINE-DEXTROAMPHET ER 30 MG PO CP24: 30.0000 mg | ORAL_CAPSULE | Freq: Every day | ORAL | 0 refills | Status: DC | PRN

## 2023-01-30 MED ORDER — CARIPRAZINE HCL 3 MG PO CAPS: 3.0000 mg | ORAL_CAPSULE | Freq: Every day | ORAL | 5 refills | Status: DC

## 2023-01-30 NOTE — Progress Notes (Signed)
[facility] Check  Patient ID: Erika Mccoy,  MRN: [id number]  PCP: Philip Aspen, Limmie Patricia, MD  Date of Evaluation: 01/30/2023 Time spent: 22 minutes  Chief Complaint:  Chief Complaint   Anxiety; Depression; ADD; Insomnia; Follow-up    Virtual Visit via Telehealth  I connected with patient by telephone, with their informed consent, and verified patient privacy and that I am speaking with the correct person using two identifiers.  I am private, in my office and the patient is at home.  I discussed the limitations, risks, security and privacy concerns of performing an evaluation and management service by telephone and the availability of in person appointments. I also discussed with the patient that there may be a patient responsible charge related to this service. The patient expressed understanding and agreed to proceed.   I discussed the assessment and treatment plan with the patient. The patient was provided an opportunity to ask questions and all were answered. The patient agreed with the plan and demonstrated an understanding of the instructions.   The patient was advised to call back or seek an in-person evaluation if the symptoms worsen or if the condition fails to improve as anticipated.  I provided 22 minutes of non-face-to-face time during this encounter.  HISTORY/CURRENT STATUS: HPI  for routine med check.  States she's doing well for the most part.  She is feeling some better physically so that helps her mood.  She is in therapy twice a week which helps her joint pain.  Energy and motivation are good most of the time.  No extreme sadness, tearfulness, or feelings of hopelessness.  Sleeps well using the trazodone.  ADLs and personal hygiene are normal.   Denies any changes in concentration, making decisions, or remembering things.  Takes Adderall occasionally.  Appetite has not changed.  Weight is stable.  Denies suicidal or homicidal thoughts.  Does get anxious  and still uses Klonopin.  If she does not take it there are times when she will feel panicky.  It is still effective when needed.  Patient denies increased energy with decreased need for sleep, increased talkativeness, racing thoughts, impulsivity or risky behaviors, increased spending, increased libido, grandiosity, increased irritability or anger, paranoia, or hallucinations.  Denies dizziness, syncope, seizures, tingling, tremor, tics, unsteady gait, slurred speech, confusion. Has chronic diffuse muscle pain, also lumbar pain with rad to legs, seeing pain management and ortho. Continues to f/u w/ GYN, Pulmonology, Cardiology.  Individual Medical History/ Review of Systems: Changes? :Yes   see HPI  Past Psychiatric History:    She voluntarily committed herself in 2004 and 3 times since then. For depression. Never attempted suicide.    Past medications for mental health diagnoses include: Prozac didn't work, Effexor, Wellbutrin, Seroquel, Vraylar, Paxil, Zoloft, Cymbalta, Pristiq, Klonopin, Xanax, Trazodone, Ambien, Lunesta, Spravato started 10/2021, insurance will not pay for it, Lamictal didn't help but took it for a long time, Adderall, Lyrica caused crying   Never had ECT, TMS  Allergies: Ibuprofen, Neomycin-bacitracin-polymyxin  [bacitracin-neomycin-polymyxin], Quinolones, Atorvastatin, Bacitracin-polymyxin b, Bactroban [mupirocin], Benzalkonium chloride, Cefprozil, Cephalexin, Ciprofloxacin, Crestor [rosuvastatin], Lidocaine, Mederma, Monistat [miconazole], Neomycin-bacitracin zn-polymyx, Polyoxyethylene 40 sorbitol septaoleate [sorbitan], Prednisone, Quinine, Septra [sulfamethoxazole-trimethoprim], Tioconazole, Valtrex [valacyclovir hcl], and Adhesive [tape]  Current Medications:  Current Outpatient Medications:    buPROPion (WELLBUTRIN XL) 150 MG 24 hr tablet, Take 1 tablet (150 mg total) by mouth daily. Takes w/ 300 mg daily, Disp: 90 tablet, Rfl: 0   buPROPion (WELLBUTRIN XL) 300 MG  24 hr tablet, Take  1 tablet (300 mg total) by mouth daily., Disp: 90 tablet, Rfl: 1   Cholecalciferol 100 MCG (4000 UT) CAPS, Take 3 capsules by mouth daily., Disp: , Rfl:    estradiol (VIVELLE-DOT) 0.0375 MG/24HR, Place 1 patch onto the skin 2 (two) times a week., Disp: 24 patch, Rfl: 3   Evolocumab (REPATHA SURECLICK) 140 MG/ML SOAJ, Inject 140 mg into the skin every 14 (fourteen) days., Disp: 2 mL, Rfl: 11   fluticasone-salmeterol (ADVAIR HFA) 115-21 MCG/ACT inhaler, INHALE 2 PUFFS INTO THE LUNGS TWICE DAILY, Disp: 12 g, Rfl: 2   HYDROcodone-acetaminophen (NORCO) 10-325 MG tablet, Take 1 tablet 3 times a day by oral route as needed for pain for 30 days., Disp: , Rfl:    hydrOXYzine (ATARAX/VISTARIL) 25 MG tablet, Take 50 mg by mouth at bedtime. , Disp: , Rfl:    methocarbamol (ROBAXIN) 500 MG tablet, Take 500 mg by mouth as needed., Disp: , Rfl:    nitrofurantoin (MACRODANTIN) 50 MG capsule, Take 50 mg by mouth daily., Disp: , Rfl:    pantoprazole (PROTONIX) 40 MG tablet, Take 40 mg by mouth daily., Disp: , Rfl:    progesterone (PROMETRIUM) 100 MG capsule, Take 1 capsule (100 mg total) by mouth daily., Disp: 30 capsule, Rfl: 12   sulfaSALAzine (AZULFIDINE) 500 MG tablet, Take 1 tablets in AM 2 tablets in PM daily, Disp: 90 tablet, Rfl: 2   traZODone (DESYREL) 100 MG tablet, Take 1 tablet (100 mg total) by mouth at bedtime., Disp: 90 tablet, Rfl: 5   amphetamine-dextroamphetamine (ADDERALL XR) 30 MG 24 hr capsule, Take 1 capsule (30 mg total) by mouth daily as needed. Take sparingly and not with Klonopin, Disp: 30 capsule, Rfl: 0   bupivacaine (MARCAINE) 0.5 % injection, 15 mLs daily as needed. Pain in bladder (Patient not taking: Reported on 12/27/2022), Disp: , Rfl:    cariprazine (VRAYLAR) 3 MG capsule, Take 1 capsule (3 mg total) by mouth daily., Disp: 30 capsule, Rfl: 5   clobetasol ointment (TEMOVATE) 0.05 %, APPLY SMALL AMOUNT TOPICALLY TO THE AFFECTED AREA 2 TIMES A WEEK (Patient not taking:  Reported on 12/27/2022), Disp: 60 g, Rfl: 0   clonazePAM (KLONOPIN) 1 MG tablet, TAKE 1 TABLET(1 MG) BY MOUTH THREE TIMES DAILY AS NEEDED FOR ANXIETY, Disp: 90 tablet, Rfl: 5   ELMIRON 100 MG capsule, Take 100 mg by mouth in the morning and at bedtime. Takes per bladder (Patient not taking: Reported on 12/27/2022), Disp: , Rfl:    ezetimibe (ZETIA) 10 MG tablet, Take 10 mg by mouth daily. (Patient not taking: Reported on 01/30/2023), Disp: , Rfl:  Medication Side Effects: none  Family Medical/ Social History: Changes?  No  MENTAL HEALTH EXAM:  There were no vitals taken for this visit.There is no height or weight on file to calculate BMI.  General Appearance:  Unable to assess  Eye Contact:   Unable to assess  Speech:  Clear and Coherent and Normal Rate  Volume:  Normal  Mood:  Euthymic  Affect:   Unable to assess  Thought Process:  Goal Directed and Descriptions of Associations: Circumstantial  Orientation:  Full (Time, Place, and Person)  Thought Content: Logical   Suicidal Thoughts:  No  Homicidal Thoughts:  No  Memory:  WNL  Judgement:  Good  Insight:  Good  Psychomotor Activity: Unable to assess  Concentration:  Concentration: Good  Recall:  Good  Fund of Knowledge: Good  Language: Good  Assets:  Desire for Improvement Financial Resources/Insurance Housing  Resilience Transportation  ADL's:  Intact  Cognition: WNL  Prognosis:  Good   DIAGNOSES:    ICD-10-CM   1. Bipolar I disorder (HCC)  F31.9     2. PTSD (post-traumatic stress disorder) - multiple traumas, stable  F43.10     3. Multiple chronic, mostly inflammatory and autoimmune diseases  R69     4. Insomnia due to other mental disorder  F51.05    F99     5. Generalized anxiety disorder  F41.1      Receiving Psychotherapy: Yes   with Dr. [name]  RECOMMENDATIONS:  PDMP reviewed.  Klonopin filled 01/19/2023.  Adderall filled 01/19/2023.  Also on hydrocodone, known to me. I provided 22 minutes of  non-face-to-face time during this encounter, including time spent before and after the visit in records review, medical decision making, counseling pertinent to today's visit, and charting.   Jozi sounds like she is doing well so no changes are needed.  Continue Adderall XR 30 mg, 1 p.o. every morning as needed, take sparingly and not with Klonopin. Cont Wellbutrin XL 450 mg daily. Continue Vraylar 3 mg daily.  Continue Klonopin 1 mg, 1 po tid prn.  (She reports taking only at night.) Cont Hydroxyzine 25 mg, 2 po qhs.  Continue Trazodone 100 mg, 1/2-1 qhs prn.  Continue therapy with Dr. [name]. Return in 3 months.  Melony Overly, PA-C

## 2023-01-31 ENCOUNTER — Encounter: Payer: Self-pay | Admitting: Physical Therapy

## 2023-01-31 ENCOUNTER — Ambulatory Visit (INDEPENDENT_AMBULATORY_CARE_PROVIDER_SITE_OTHER): Payer: BC Managed Care – PPO | Admitting: Physical Therapy

## 2023-01-31 DIAGNOSIS — M6281 Muscle weakness (generalized): Secondary | ICD-10-CM | POA: Diagnosis not present

## 2023-01-31 DIAGNOSIS — M79605 Pain in left leg: Secondary | ICD-10-CM

## 2023-01-31 DIAGNOSIS — M79604 Pain in right leg: Secondary | ICD-10-CM

## 2023-01-31 DIAGNOSIS — Z7409 Other reduced mobility: Secondary | ICD-10-CM

## 2023-01-31 DIAGNOSIS — R2681 Unsteadiness on feet: Secondary | ICD-10-CM | POA: Diagnosis not present

## 2023-01-31 DIAGNOSIS — R2689 Other abnormalities of gait and mobility: Secondary | ICD-10-CM

## 2023-01-31 DIAGNOSIS — R42 Dizziness and giddiness: Secondary | ICD-10-CM
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
## 2023-02-03 ENCOUNTER — Encounter: Payer: Self-pay | Admitting: Physical Therapy

## 2023-02-03 ENCOUNTER — Ambulatory Visit (INDEPENDENT_AMBULATORY_CARE_PROVIDER_SITE_OTHER): Payer: BC Managed Care – PPO | Admitting: Physical Therapy

## 2023-02-03 ENCOUNTER — Ambulatory Visit: Payer: BC Managed Care – PPO | Admitting: Psychiatry

## 2023-02-03 DIAGNOSIS — M79605 Pain in left leg: Secondary | ICD-10-CM

## 2023-02-03 DIAGNOSIS — R2681 Unsteadiness on feet: Secondary | ICD-10-CM

## 2023-02-03 DIAGNOSIS — M6281 Muscle weakness (generalized): Secondary | ICD-10-CM | POA: Diagnosis not present

## 2023-02-03 DIAGNOSIS — M79604 Pain in right leg: Secondary | ICD-10-CM

## 2023-02-03 DIAGNOSIS — R2689 Other abnormalities of gait and mobility: Secondary | ICD-10-CM
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
## 2023-02-07 ENCOUNTER — Ambulatory Visit: Payer: BC Managed Care – PPO | Admitting: Physical Therapy

## 2023-02-07 ENCOUNTER — Telehealth: Payer: Self-pay | Admitting: Pharmacist

## 2023-02-07 ENCOUNTER — Encounter: Payer: Self-pay | Admitting: Physical Therapy

## 2023-02-07 DIAGNOSIS — M6281 Muscle weakness (generalized): Secondary | ICD-10-CM

## 2023-02-07 DIAGNOSIS — R2681 Unsteadiness on feet: Secondary | ICD-10-CM | POA: Diagnosis not present

## 2023-02-07 DIAGNOSIS — R2689 Other abnormalities of gait and mobility: Secondary | ICD-10-CM

## 2023-02-07 DIAGNOSIS — M79605 Pain in left leg: Secondary | ICD-10-CM

## 2023-02-07 DIAGNOSIS — M79604 Pain in right leg: Secondary | ICD-10-CM

## 2023-02-07 DIAGNOSIS — Z7409 Other reduced mobility: Secondary | ICD-10-CM

## 2023-02-07 DIAGNOSIS — R42 Dizziness and giddiness: Secondary | ICD-10-CM
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
## 2023-02-07 NOTE — Telephone Encounter (Signed)
Received letter that denial of Vascepa was upheld due to pt not being on statin and TG not within range, although they are.  Patient states she will take OTC fish oil and continue with her Repatha. States Repatha is going well.

## 2023-02-08 ENCOUNTER — Encounter: Payer: Self-pay | Admitting: Internal Medicine

## 2023-02-10 ENCOUNTER — Ambulatory Visit: Payer: BC Managed Care – PPO | Admitting: Physical Therapy

## 2023-02-13 ENCOUNTER — Ambulatory Visit: Payer: BC Managed Care – PPO | Admitting: Neurology

## 2023-02-14 ENCOUNTER — Telehealth: Payer: Self-pay | Admitting: Physician Assistant

## 2023-02-14 NOTE — Telephone Encounter (Signed)
Pt lvm that she would like to tak to teresa, Please call her at [phone]

## 2023-02-14 NOTE — Telephone Encounter (Signed)
Patient reports having significant daily PA. She has been on clonazepam since 1999. Reports needing clonazepam or something similar because of seizures and Wellbutrin. She said they moved in August and the house is not as she would like it. It needs some repairs that she is unable to do. She said she doesn't do well with change. She reports her parents died when she was young, she has no siblings. She feels she has to be strong for her children, but she doesn't have anyone to be strong for her. She did not cry, but was on the verge most of the conversation. She has trouble sleeping, both with getting to sleep, but more so with staying asleep. Wakes up around 2:30-3:00. She said Mardelle Matte mentioned trying prazosin. She does admit to nightmares due to the loss of her parents. She is currently scheduled for FU in December. I told her I would ask admin to call and schedule an earlier appt. She said she usually does telehealth visits, but will come into the office.

## 2023-02-15 ENCOUNTER — Encounter: Payer: BC Managed Care – PPO | Admitting: Physical Therapy

## 2023-02-15 ENCOUNTER — Telehealth: Payer: Self-pay | Admitting: Physical Therapy

## 2023-02-15 NOTE — Telephone Encounter (Signed)
Patient no showed PT appointment. PT left message regarding policy and need to schedule 1x next week to reassess.

## 2023-02-15 NOTE — Telephone Encounter (Signed)
Erika Mccoy asks that an in office appt be made ASAP and okay to put in urgent slot.

## 2023-02-15 NOTE — Telephone Encounter (Signed)
Patient is taking 3 Klonopin a day, sometimes 4. She said it takes more than one Klonopin to control a PA. She knows that she has to be careful with how many she takes so she won't run out too early. She said she was going to PT today and a road she usually takes was closed and she just started bawling and was not able to go to PT. She said she doesn't know why she is having this problem, but per the previous conversation she has a lot going on.  She is limiting Adderall and has one caffeinated beverage a day. She has an in office appt on 10/8.

## 2023-02-15 NOTE — Telephone Encounter (Signed)
Pt has appt 10/8 at 1:30 in the urgent spot

## 2023-02-15 NOTE — Telephone Encounter (Signed)
According to my last note she was only taking Klonopin at night.  If that still the case have her increase it to twice a day as needed.  Avoid the Adderall if at all possible because she needs the benzodiazepine more right now I think.  Also no caffeine have her make an appointment with me as soon as she can get in.  Okay to put in an urgent spot.  Thanks.

## 2023-02-16 NOTE — Telephone Encounter (Signed)
I recommend increasing Vraylar to 4.5 mg. If she prefers, we can wait until her appt to discuss.

## 2023-02-16 NOTE — Telephone Encounter (Signed)
Patient is on 3 mg and does not have any 1.5. She will wait until appt on Tuesday to discuss.

## 2023-02-17 ENCOUNTER — Ambulatory Visit: Payer: BC Managed Care – PPO | Admitting: Physical Therapy

## 2023-02-21 ENCOUNTER — Ambulatory Visit: Payer: BC Managed Care – PPO | Admitting: Physician Assistant

## 2023-02-21 ENCOUNTER — Encounter: Payer: Self-pay | Admitting: Physician Assistant

## 2023-02-21 DIAGNOSIS — F4310 Post-traumatic stress disorder, unspecified: Secondary | ICD-10-CM | POA: Diagnosis not present

## 2023-02-21 DIAGNOSIS — F319 Bipolar disorder, unspecified: Secondary | ICD-10-CM | POA: Diagnosis not present

## 2023-02-21 DIAGNOSIS — F411 Generalized anxiety disorder: Secondary | ICD-10-CM | POA: Diagnosis not present

## 2023-02-21 DIAGNOSIS — R69 Illness, unspecified: Secondary | ICD-10-CM

## 2023-02-21 DIAGNOSIS — R41840 Attention and concentration deficit: Secondary | ICD-10-CM

## 2023-02-21 MED ORDER — CLONAZEPAM 1 MG PO TABS: ORAL_TABLET | ORAL | 5 refills | Status: DC

## 2023-02-21 NOTE — Progress Notes (Signed)
[facility]  Patient ID: Erika Mccoy,  MRN: [id number]  PCP: Philip Aspen, Limmie Patricia, MD  Date of Evaluation: 02/21/2023 Time spent:25 minutes  Chief Complaint:  Chief Complaint   Anxiety; Depression; Insomnia; Follow-up    HISTORY/CURRENT STATUS: HPI  for routine med check.  Uses Adderall occas when she has something she needs laser focus or increased energy. It's still helpful. Never takes w/ Klonopin.  Has been more anxious the past few weeks. Has been taking the klonopin tid most of the time now. In the past she was only taking at night. Not having PA but feels like she might if she doesn't take it. Mostly just overwhelmed. Has had a lot going on the past year, grief of her ex husband (her kids father) all the physical issues and new dx and tx that she's dealing with. "It's just a lot. I think when I called last week I was just overwhelmed. I feel better now though.' Doesn't enjoy things but mostly b/c of physical issues.  Energy and motivation are fair, depending on how she feels physically each day.  Unable to work.  No extreme sadness, tearfulness, or feelings of hopelessness.  Sleeps ok.  ADLs and personal hygiene are normal.   No change in memory.   Appetite has not changed.  Weight is stable.  Denies suicidal or homicidal thoughts.  Patient denies increased energy with decreased need for sleep, increased talkativeness, racing thoughts, impulsivity or risky behaviors, increased spending, increased libido, grandiosity, increased irritability or anger, paranoia, or hallucinations.  Denies dizziness, syncope, seizures, tingling, tremor, tics, unsteady gait, slurred speech, confusion. Has chronic diffuse muscle pain, also lumbar pain with rad to legs, seeing pain management and ortho. Continues to f/u w/ GYN, Pulmonology, Cardiology.  Individual Medical History/ Review of Systems: Changes? :No     Past Psychiatric History:    She voluntarily committed herself in  2004 and 3 times since then. For depression. Never attempted suicide.    Past medications for mental health diagnoses include: Prozac didn't work, Effexor, Wellbutrin, Seroquel, Vraylar, Paxil, Zoloft, Cymbalta, Pristiq, Klonopin, Xanax, Trazodone, Ambien, Lunesta, Spravato started 10/2021, insurance will not pay for it, Lamictal didn't help but took it for a long time, Adderall, Lyrica caused crying   Never had ECT, TMS  Allergies: Ibuprofen, Neomycin-bacitracin-polymyxin  [bacitracin-neomycin-polymyxin], Quinolones, Atorvastatin, Bacitracin-polymyxin b, Bactroban [mupirocin], Benzalkonium chloride, Cefprozil, Cephalexin, Ciprofloxacin, Crestor [rosuvastatin], Lidocaine, Mederma, Monistat [miconazole], Neomycin-bacitracin zn-polymyx, Polyoxyethylene 40 sorbitol septaoleate [sorbitan], Prednisone, Quinine, Septra [sulfamethoxazole-trimethoprim], Tioconazole, Valtrex [valacyclovir hcl], and Adhesive [tape]  Current Medications:  Current Outpatient Medications:    amphetamine-dextroamphetamine (ADDERALL XR) 30 MG 24 hr capsule, Take 1 capsule (30 mg total) by mouth daily as needed. Take sparingly and not with Klonopin, Disp: 30 capsule, Rfl: 0   ASPIRIN 81 PO, Take by mouth., Disp: , Rfl:    buPROPion (WELLBUTRIN XL) 150 MG 24 hr tablet, Take 1 tablet (150 mg total) by mouth daily. Takes w/ 300 mg daily, Disp: 90 tablet, Rfl: 0   buPROPion (WELLBUTRIN XL) 300 MG 24 hr tablet, Take 1 tablet (300 mg total) by mouth daily., Disp: 90 tablet, Rfl: 1   cariprazine (VRAYLAR) 3 MG capsule, Take 1 capsule (3 mg total) by mouth daily., Disp: 30 capsule, Rfl: 5   Cholecalciferol 100 MCG (4000 UT) CAPS, Take 1 capsule by mouth daily., Disp: , Rfl:    estradiol (VIVELLE-DOT) 0.0375 MG/24HR, Place 1 patch onto the skin 2 (two) times a week., Disp: 24 patch, Rfl: 3  Evolocumab (REPATHA SURECLICK) 140 MG/ML SOAJ, Inject 140 mg into the skin every 14 (fourteen) days., Disp: 2 mL, Rfl: 11   fluticasone-salmeterol  (ADVAIR HFA) 115-21 MCG/ACT inhaler, INHALE 2 PUFFS INTO THE LUNGS TWICE DAILY, Disp: 12 g, Rfl: 2   HYDROcodone-acetaminophen (NORCO) 10-325 MG tablet, Take 1 tablet 3 times a day by oral route as needed for pain for 30 days., Disp: , Rfl:    hydrOXYzine (ATARAX/VISTARIL) 25 MG tablet, Take 50 mg by mouth at bedtime. , Disp: , Rfl:    methocarbamol (ROBAXIN) 500 MG tablet, Take 500 mg by mouth as needed., Disp: , Rfl:    nitrofurantoin (MACRODANTIN) 50 MG capsule, Take 50 mg by mouth daily., Disp: , Rfl:    pantoprazole (PROTONIX) 40 MG tablet, Take 40 mg by mouth daily., Disp: , Rfl:    progesterone (PROMETRIUM) 100 MG capsule, Take 1 capsule (100 mg total) by mouth daily., Disp: 30 capsule, Rfl: 12   sulfaSALAzine (AZULFIDINE) 500 MG tablet, Take 1 tablets in AM 2 tablets in PM daily, Disp: 90 tablet, Rfl: 2   traZODone (DESYREL) 100 MG tablet, Take 1 tablet (100 mg total) by mouth at bedtime., Disp: 90 tablet, Rfl: 5   bupivacaine (MARCAINE) 0.5 % injection, 15 mLs daily as needed. Pain in bladder (Patient not taking: Reported on 12/27/2022), Disp: , Rfl:    clobetasol ointment (TEMOVATE) 0.05 %, APPLY SMALL AMOUNT TOPICALLY TO THE AFFECTED AREA 2 TIMES A WEEK (Patient not taking: Reported on 12/27/2022), Disp: 60 g, Rfl: 0   clonazePAM (KLONOPIN) 1 MG tablet, TAKE 1 TABLET(1 MG) BY MOUTH THREE TIMES DAILY AS NEEDED FOR ANXIETY, Disp: 90 tablet, Rfl: 5   ELMIRON 100 MG capsule, Take 100 mg by mouth in the morning and at bedtime. Takes per bladder (Patient not taking: Reported on 12/27/2022), Disp: , Rfl:    ezetimibe (ZETIA) 10 MG tablet, Take 10 mg by mouth daily. (Patient not taking: Reported on 01/30/2023), Disp: , Rfl:  Medication Side Effects: sexual dysfunction  Family Medical/ Social History: Changes?  No  MENTAL HEALTH EXAM:  There were no vitals taken for this visit.There is no height or weight on file to calculate BMI.  General Appearance: Casual and Well Groomed  Eye Contact:  Good   Speech:  Clear and Coherent and Normal Rate  Volume:  Normal  Mood:  Euthymic  Affect:  Congruent  Thought Process:  Goal Directed and Descriptions of Associations: Circumstantial  Orientation:  Full (Time, Place, and Person)  Thought Content: Logical   Suicidal Thoughts:  No  Homicidal Thoughts:  No  Memory:  WNL  Judgement:  Good  Insight:  Good  Psychomotor Activity: normal  Concentration:  Concentration: Good  Recall:  Good  Fund of Knowledge: Good  Language: Good  Assets:  Desire for Improvement Financial Resources/Insurance Housing Resilience Transportation  ADL's:  Intact  Cognition: WNL  Prognosis:  Good   Cardiology and PCP follows labs. Has increased lipids, see labs 11/10/2022.  DIAGNOSES:    ICD-10-CM   1. Bipolar I disorder (HCC)  F31.9     2. PTSD (post-traumatic stress disorder) - multiple traumas, stable  F43.10     3. Generalized anxiety disorder  F41.1     4. Multiple chronic, mostly inflammatory and autoimmune diseases  R69     5. Disturbed concentration  R41.840       Receiving Psychotherapy: Yes   with Dr. [name]  RECOMMENDATIONS:  PDMP reviewed.  Klonopin filled 01/19/2023.  Adderall filled 01/19/2023.  Also on hydrocodone, known to me. I provided 25 minutes of face to face time during this encounter, including time spent before and after the visit in records review, medical decision making, counseling pertinent to today's visit, and charting.   She's feeling better mentally and she and I agree no changes need to be made. If anxiety worsens, may need to consider Buspar, or other preventative med. Will follow closely and she's seeing Dr. Farrel Demark regularly which will be helpful.   Continue Adderall XR 30 mg, 1 p.o. every morning as needed, take sparingly and not with Klonopin. Cont Wellbutrin XL 450 mg daily. Continue Vraylar 3 mg daily.  Continue Klonopin 1 mg, 1 po tid prn.   Cont Hydroxyzine 25 mg, 2 po qhs.  Continue Trazodone 100 mg,  1/2-1 qhs prn.  Continue therapy with Dr. [name]. Return in 2 months.    Melony Overly, PA-C

## 2023-02-22 ENCOUNTER — Telehealth: Payer: Self-pay

## 2023-02-22 NOTE — Telephone Encounter (Signed)
Patient contacted the office and states she has a couple questions. Patient inquires if she will get well while on the sulfasalazine. Patient states she is still having severe joint and muscle tightness and fatigue that will not go away. Patient inquires if these symptoms will go away or if they will always stay even while on medication. Please advise.   Patient states she still has trouble with her knees and thighs and states they feel heavy and achy. Patient states she cannot live a normal life. Patient states she has been having panic attacks because of how she feels. Patient inquires if she possibly has MS. Patient inquires what is happening to her. Please advise.

## 2023-02-23 ENCOUNTER — Ambulatory Visit: Payer: BC Managed Care – PPO | Admitting: Physical Therapy

## 2023-02-23 ENCOUNTER — Encounter: Payer: Self-pay | Admitting: Physical Therapy

## 2023-02-23 DIAGNOSIS — R2681 Unsteadiness on feet: Secondary | ICD-10-CM

## 2023-02-23 DIAGNOSIS — M6281 Muscle weakness (generalized): Secondary | ICD-10-CM

## 2023-02-23 DIAGNOSIS — R2689 Other abnormalities of gait and mobility: Secondary | ICD-10-CM

## 2023-02-23 DIAGNOSIS — M79604 Pain in right leg: Secondary | ICD-10-CM | POA: Diagnosis not present

## 2023-02-23 DIAGNOSIS — M79605 Pain in left leg: Secondary | ICD-10-CM

## 2023-02-23 NOTE — Telephone Encounter (Signed)
Patient advised The goal of sulfasalazine treatment would be to decrease her joint pain swelling and stiffness.  This will not necessarily improve symptoms by 100%.  However Dr. Dimple Casey also thinks these can be improved with nonmedication interventions such as addressing sleep and stress and with physical therapy. Dr. Dimple Casey has not seen anything concerning me that she has MS.  Dr. Dimple Casey does believe a lot of symptoms are worse due to fibromyalgia syndrome as her pain and fatigue very bad relative to the amount of joint damage that he has been able to see on our exams or test results. Patient verbalized understanding.   Patient states she is going to physical therapy and actually went today. Patient states she went down on her Sulfasalazine to 1 tablet in AM 2 tablets in PM daily. Patient inquires if she can go back to Take 2 tablets in AM 2 tablets in PM daily. Patient states if she can go back up, she will need a new prescription sent to [facility] on [address]. Please advise.
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
## 2023-02-23 NOTE — Telephone Encounter (Signed)
The goal of sulfasalazine treatment would be to decrease her joint pain swelling and stiffness.  This will not necessarily improve symptoms by 100%.  However I also think these can be improved with nonmedication interventions such as addressing sleep and stress and with physical therapy. I have not seen anything concerning me that she has MS.  I do believe a lot of symptoms are worse due to fibromyalgia syndrome as her pain and fatigue very bad relative to the amount of joint damage that I have been able to see on our exams or test results.

## 2023-02-28 ENCOUNTER — Encounter: Payer: Self-pay | Admitting: Physical Therapy

## 2023-02-28 ENCOUNTER — Ambulatory Visit: Payer: BC Managed Care – PPO | Admitting: Physical Therapy

## 2023-02-28 DIAGNOSIS — R2681 Unsteadiness on feet: Secondary | ICD-10-CM | POA: Diagnosis not present

## 2023-02-28 DIAGNOSIS — M79605 Pain in left leg: Secondary | ICD-10-CM

## 2023-02-28 DIAGNOSIS — M6281 Muscle weakness (generalized): Secondary | ICD-10-CM

## 2023-02-28 DIAGNOSIS — M79604 Pain in right leg: Secondary | ICD-10-CM | POA: Diagnosis not present

## 2023-02-28 DIAGNOSIS — R2689 Other abnormalities of gait and mobility: Secondary | ICD-10-CM
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
## 2023-03-02 ENCOUNTER — Encounter: Payer: Self-pay | Admitting: Physical Therapy

## 2023-03-02 ENCOUNTER — Ambulatory Visit: Payer: BC Managed Care – PPO | Admitting: Physical Therapy

## 2023-03-02 DIAGNOSIS — R2689 Other abnormalities of gait and mobility: Secondary | ICD-10-CM

## 2023-03-02 DIAGNOSIS — M79605 Pain in left leg: Secondary | ICD-10-CM | POA: Diagnosis not present

## 2023-03-02 DIAGNOSIS — R2681 Unsteadiness on feet: Secondary | ICD-10-CM | POA: Diagnosis not present

## 2023-03-02 DIAGNOSIS — M6281 Muscle weakness (generalized): Secondary | ICD-10-CM | POA: Diagnosis not present

## 2023-03-02 DIAGNOSIS — M79604 Pain in right leg: Secondary | ICD-10-CM
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
## 2023-03-23 NOTE — Progress Notes (Signed)
   Cardiology Office Note:    Date:  03/24/2023  ID:  Deshae Zemel, DOB [birth year], MRN [id number] PCP: Philip Aspen, Limmie Patricia, MD  [facility] Providers Cardiologist:  Verne Carrow, MD       Patient Profile:      Coronary artery disease CCTA 06/03/21: CAC score 0, pLAD 25-49 (noncalcified) TTE 06/08/21: EF 58, no RWMA, NL RVSF, trivial AI, RAP 3 Hyperlipidemia  Intolerant to statins >> Zetia, PCSK9 inhibitor Palpitations  Monitor 04/2022: NSR Anxiety/Depression Fibromyalgia Gastritis  IBS PTSD           History of Present Illness:  Discussed the use of AI scribe software for clinical note transcription with the patient, who gave verbal consent to proceed.  Julianne Buckhannon is a 57 y.o. female who returns for follow-up of CAD.  She was last seen in July 2024. She is here with her husband. She notes a recent weight loss, which she attributes to dietary changes and regular exercise on a recumbent bike. She does note shortness of breath, particularly during exertion such as climbing stairs. This has been ongoing for a couple of months. She also has a history of asthma and is currently in a season where she is exposed to prevalent leaf mold, a known allergen for her. She feels better with her rescue inhaler. She has taken approximately five doses of Repatha and reports tolerating it well, despite some diffuse pain.      ROS   See HPI     Studies Reviewed:                 Risk Assessment/Calculations:             Physical Exam:   VS:  BP 102/62   Pulse 88   Ht 5\' 5"  (1.651 m)   Wt 159 lb 3.2 oz (72.2 kg)   SpO2 95%   BMI 26.49 kg/m    Wt Readings from Last 3 Encounters:  03/24/23 159 lb 3.2 oz (72.2 kg)  12/27/22 163 lb (73.9 kg)  11/30/22 165 lb (74.8 kg)    Constitutional:      Appearance: Healthy appearance. Not in distress.  Neck:     Vascular: JVD normal.  Pulmonary:     Breath sounds: Normal breath sounds. No wheezing. No rales.   Cardiovascular:     Normal rate. Regular rhythm.     Murmurs: There is no murmur.  Edema:    Peripheral edema absent.  Abdominal:     Palpations: Abdomen is soft.        Assessment and Plan:   Assessment & Plan Mixed hyperlipidemia She is intolerant to statins and Zetia. Currently tolerating Repatha well. Previous CCTA with calcium score zero and minor noncalcific plaque. Goal is to keep LDL <70. Discussed benefits of Repatha in significantly lowering cholesterol levels and maintaining a healthy diet and exercise regimen.   - Continue Repatha 140 mg every two weeks - Order lipid panel and hepatic function panel today - Follow up in one year unless issues arise  Coronary artery disease involving native coronary artery of native heart without angina pectoris CAC score 0 and mild noncalcific plaque on CCTA in 2013. She is doing well w/o angina. She has some dyspnea on exertion that is due to asthma. No further testing needed.       Dispo:  Return in about 1 year (around 03/23/2024) for Routine Follow Up, w/ Dr. Clifton James.  Signed, Tereso Newcomer, PA-C

## 2023-03-24 ENCOUNTER — Ambulatory Visit: Payer: BC Managed Care – PPO | Attending: Physician Assistant | Admitting: Physician Assistant

## 2023-03-24 ENCOUNTER — Encounter: Payer: Self-pay | Admitting: Physician Assistant

## 2023-03-24 VITALS — BP 102/62 | HR 88 | Ht 65.0 in | Wt 159.2 lb

## 2023-03-24 DIAGNOSIS — I2510 Atherosclerotic heart disease of native coronary artery without angina pectoris: Secondary | ICD-10-CM

## 2023-03-24 DIAGNOSIS — E782 Mixed hyperlipidemia: Secondary | ICD-10-CM

## 2023-03-24 MED ORDER — REPATHA SURECLICK 140 MG/ML ~~LOC~~ SOAJ: 1.0000 mL | SUBCUTANEOUS | 11 refills | Status: DC

## 2023-03-24 NOTE — Assessment & Plan Note (Signed)
CAC score 0 and mild noncalcific plaque on CCTA in 2013. She is doing well w/o angina. She has some dyspnea on exertion that is due to asthma. No further testing needed.

## 2023-03-24 NOTE — Assessment & Plan Note (Signed)
She is intolerant to statins and Zetia. Currently tolerating Repatha well. Previous CCTA with calcium score zero and minor noncalcific plaque. Goal is to keep LDL <70. Discussed benefits of Repatha in significantly lowering cholesterol levels and maintaining a healthy diet and exercise regimen.   - Continue Repatha 140 mg every two weeks - Order lipid panel and hepatic function panel today - Follow up in one year unless issues arise

## 2023-03-24 NOTE — Patient Instructions (Signed)
Medication Instructions:  Your physician recommends that you continue on your current medications as directed. Please refer to the Current Medication list given to you today.  *If you need a refill on your cardiac medications before your next appointment, please call your pharmacy*   Lab Work: None ordered   If you have labs (blood work) drawn today and your tests are completely normal, you will receive your results only by: MyChart Message (if you have MyChart) OR A paper copy in the mail If you have any lab test that is abnormal or we need to change your treatment, we will call you to review the results.   Testing/Procedures: None ordered    Follow-Up: At [facility], you and your health needs are our priority.  As part of our continuing mission to provide you with exceptional heart care, we have created designated Provider Care Teams.  These Care Teams include your primary Cardiologist (physician) and Advanced Practice Providers (APPs -  Physician Assistants and Nurse Practitioners) who all work together to provide you with the care you need, when you need it.  We recommend signing up for the patient portal called "MyChart".  Sign up information is provided on this After Visit Summary.  MyChart is used to connect with patients for Virtual Visits (Telemedicine).  Patients are able to view lab/test results, encounter notes, upcoming appointments, etc.  Non-urgent messages can be sent to your provider as well.   To learn more about what you can do with MyChart, go to [email or web address].    Your next appointment:   12 month(s)  Provider:   Christopher McAlhany, MD     Other Instructions   

## 2023-03-25 LAB — HEPATIC FUNCTION PANEL
ALT: 8 [IU]/L (ref 0–32)
AST: 13 [IU]/L (ref 0–40)
Albumin: 4.2 g/dL (ref 3.8–4.9)
Alkaline Phosphatase: 68 [IU]/L (ref 44–121)
Bilirubin Total: 0.2 mg/dL (ref 0.0–1.2)
Bilirubin, Direct: 0.08 mg/dL (ref 0.00–0.40)
Total Protein: 6.3 g/dL (ref 6.0–8.5)

## 2023-03-25 LAB — LIPID PANEL
Chol/HDL Ratio: 3.4 ratio (ref 0.0–4.4)
Cholesterol, Total: 128 mg/dL (ref 100–199)
HDL: 38 mg/dL — ABNORMAL LOW (ref >39)
LDL Chol Calc (NIH): 56 mg/dL (ref 0–99)
Triglycerides: 212 mg/dL — ABNORMAL HIGH (ref 0–149)
VLDL Cholesterol Cal: 34 mg/dL (ref 5–40)

## 2023-03-29 ENCOUNTER — Encounter: Payer: Self-pay | Admitting: Internal Medicine

## 2023-03-29 ENCOUNTER — Ambulatory Visit: Payer: BC Managed Care – PPO | Attending: Internal Medicine | Admitting: Internal Medicine

## 2023-03-29 VITALS — BP 95/68 | HR 85 | Resp 14 | Ht 65.0 in | Wt 158.0 lb

## 2023-03-29 DIAGNOSIS — M797 Fibromyalgia: Secondary | ICD-10-CM | POA: Diagnosis not present

## 2023-03-29 DIAGNOSIS — M2550 Pain in unspecified joint: Secondary | ICD-10-CM

## 2023-03-29 DIAGNOSIS — R768 Other specified abnormal immunological findings in serum: Secondary | ICD-10-CM

## 2023-03-29 DIAGNOSIS — M7061 Trochanteric bursitis, right hip: Secondary | ICD-10-CM

## 2023-03-29 DIAGNOSIS — Z79899 Other long term (current) drug therapy: Secondary | ICD-10-CM

## 2023-03-29 DIAGNOSIS — M7062 Trochanteric bursitis, left hip: Secondary | ICD-10-CM

## 2023-03-29 DIAGNOSIS — N3010 Interstitial cystitis (chronic) without hematuria: Secondary | ICD-10-CM

## 2023-03-29 DIAGNOSIS — M7050 Other bursitis of knee, unspecified knee: Secondary | ICD-10-CM

## 2023-03-29 MED ORDER — SULFASALAZINE 500 MG PO TBEC
1000.0000 mg | DELAYED_RELEASE_TABLET | Freq: Two times a day (BID) | ORAL | 2 refills | Status: DC
Start: 2023-03-29 — End: 2023-05-31

## 2023-03-29 MED ORDER — NITROFURANTOIN MACROCRYSTAL 50 MG PO CAPS
50.0000 mg | ORAL_CAPSULE | Freq: Every day | ORAL | Status: DC
Start: 2023-03-29 — End: 2023-05-31

## 2023-03-30 LAB — CBC WITH DIFFERENTIAL/PLATELET
Absolute Lymphocytes: 2877 {cells}/uL (ref 850–3900)
Absolute Monocytes: 635 {cells}/uL (ref 200–950)
Basophils Absolute: 62 {cells}/uL (ref 0–200)
Basophils Relative: 0.9 %
Eosinophils Absolute: 248 {cells}/uL (ref 15–500)
Eosinophils Relative: 3.6 %
HCT: 40.7 % (ref 35.0–45.0)
Hemoglobin: 13.2 g/dL (ref 11.7–15.5)
MCH: 30 pg (ref 27.0–33.0)
MCHC: 32.4 g/dL (ref 32.0–36.0)
MCV: 92.5 fL (ref 80.0–100.0)
MPV: 10.1 fL (ref 7.5–12.5)
Monocytes Relative: 9.2 %
Neutro Abs: 3077 {cells}/uL (ref 1500–7800)
Neutrophils Relative %: 44.6 %
Platelets: 234 10*3/uL (ref 140–400)
RBC: 4.4 10*6/uL (ref 3.80–5.10)
RDW: 12 % (ref 11.0–15.0)
Total Lymphocyte: 41.7 %
WBC: 6.9 10*3/uL (ref 3.8–10.8)

## 2023-03-30 LAB — COMPLETE METABOLIC PANEL WITH GFR
AG Ratio: 2.3 (calc) (ref 1.0–2.5)
ALT: 9 U/L (ref 6–29)
AST: 12 U/L (ref 10–35)
Albumin: 4.5 g/dL (ref 3.6–5.1)
Alkaline phosphatase (APISO): 66 U/L (ref 37–153)
BUN: 15 mg/dL (ref 7–25)
CO2: 31 mmol/L (ref 20–32)
Calcium: 10.1 mg/dL (ref 8.6–10.4)
Chloride: 102 mmol/L (ref 98–110)
Creat: 0.86 mg/dL (ref 0.50–1.03)
Globulin: 2 g/dL (ref 1.9–3.7)
Glucose, Bld: 95 mg/dL (ref 65–99)
Potassium: 4.4 mmol/L (ref 3.5–5.3)
Sodium: 139 mmol/L (ref 135–146)
Total Bilirubin: 0.4 mg/dL (ref 0.2–1.2)
Total Protein: 6.5 g/dL (ref 6.1–8.1)
eGFR: 79 mL/min/{1.73_m2} (ref >=60)

## 2023-03-30 LAB — SEDIMENTATION RATE: Sed Rate: 9 mm/h (ref 0–30)

## 2023-04-03 ENCOUNTER — Telehealth: Payer: Self-pay | Admitting: Physician Assistant

## 2023-04-03 NOTE — Telephone Encounter (Signed)
LVM to r/c.

## 2023-04-03 NOTE — Telephone Encounter (Signed)
Erika Mccoy called and LM at 12:44 wanting to discuss increase in medication.  Please call.

## 2023-04-04 LAB — LIPID PANEL

## 2023-04-04 LAB — HEPATIC FUNCTION PANEL

## 2023-04-04 NOTE — Telephone Encounter (Signed)
Erika Mccoy states that she is being treated for RA and it is really bad, pain, unable to sleep the medication she is on makes her more tired.. She is worn out, she does not sleep well. She has no energy. She states that she can take an adderall and goes to sleep, it does not giver her energy. She does not drink coffee because it upsets her stomach. She would like to increase her adderall 30 mg er. She states that this has been going on for months. She states that since the diagnosis it has made her depression worse.  Please advise.

## 2023-04-05 NOTE — Telephone Encounter (Signed)
Patient is willing to try Auvelity. Will pull samples for her to try. She knows to stop Wellbutrin.

## 2023-04-05 NOTE — Telephone Encounter (Signed)
Please call and disc Auvelity. 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  The most common side effects are dizziness, headache, diarrhea, dry mouth, increased sweating, drowsiness.  Since she is already on Wellbutrin I doubt these will occur, but if they do they should improve within about 2 weeks.  Does she want to try it? She would stop the Wellbutrin once she starts this med.

## 2023-04-05 NOTE — Telephone Encounter (Signed)
LVM to RC 

## 2023-04-17 ENCOUNTER — Encounter: Payer: Self-pay | Admitting: Family Medicine

## 2023-04-17 ENCOUNTER — Ambulatory Visit (INDEPENDENT_AMBULATORY_CARE_PROVIDER_SITE_OTHER): Payer: BC Managed Care – PPO | Admitting: Family Medicine

## 2023-04-17 VITALS — BP 130/80 | HR 77 | Resp 20 | Ht 65.0 in | Wt 157.0 lb

## 2023-04-17 DIAGNOSIS — Z1329 Encounter for screening for other suspected endocrine disorder: Secondary | ICD-10-CM

## 2023-04-17 DIAGNOSIS — Z7689 Persons encountering health services in other specified circumstances: Secondary | ICD-10-CM | POA: Diagnosis not present

## 2023-04-17 DIAGNOSIS — Z131 Encounter for screening for diabetes mellitus: Secondary | ICD-10-CM | POA: Diagnosis not present

## 2023-04-17 DIAGNOSIS — Z1321 Encounter for screening for nutritional disorder: Secondary | ICD-10-CM | POA: Diagnosis not present

## 2023-04-17 NOTE — Patient Instructions (Addendum)
Thank you for trusting me to be a part of your healthcare team.  I am looking forward to working with you!  From what I can see, the hepatic blood test was canceled because of the issue that we discussed with the blood sample being unusable by the time that it was transported to the lab.  Your hepatic function was completely normal on the comprehensive metabolic panel that you had done on 03/29/2023.
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
## 2023-04-18 ENCOUNTER — Other Ambulatory Visit: Payer: BC Managed Care – PPO

## 2023-04-18 DIAGNOSIS — Z1329 Encounter for screening for other suspected endocrine disorder: Secondary | ICD-10-CM

## 2023-04-18 DIAGNOSIS — Z1321 Encounter for screening for nutritional disorder: Secondary | ICD-10-CM

## 2023-04-18 DIAGNOSIS — Z131 Encounter for screening for diabetes mellitus: Secondary | ICD-10-CM

## 2023-04-19 LAB — HEMOGLOBIN A1C
Est. average glucose Bld gHb Est-mCnc: 88 mg/dL
Hgb A1c MFr Bld: 4.7 % — ABNORMAL LOW (ref 4.8–5.6)

## 2023-04-19 LAB — VITAMIN D 25 HYDROXY (VIT D DEFICIENCY, FRACTURES): Vit D, 25-Hydroxy: 49 ng/mL (ref 30.0–100.0)

## 2023-04-19 LAB — VITAMIN B12: Vitamin B-12: 499 pg/mL (ref 232–1245)

## 2023-04-19 LAB — TSH RFX ON ABNORMAL TO FREE T4: TSH: 3.24 u[IU]/mL (ref 0.450–4.500)

## 2023-04-25 ENCOUNTER — Ambulatory Visit: Payer: BC Managed Care – PPO | Admitting: Physician Assistant

## 2023-04-27 ENCOUNTER — Other Ambulatory Visit: Payer: Self-pay

## 2023-04-27 ENCOUNTER — Telehealth: Payer: Self-pay | Admitting: Physician Assistant

## 2023-04-27 NOTE — Telephone Encounter (Signed)
Erika Mccoy called and LM at 3:19 requesting a refill of her Auvelity.  She is almost out of samples.  Sent to  [id number] - [place], [place] - [address]   Appt 12/16

## 2023-04-28 NOTE — Telephone Encounter (Signed)
Pt has apt on Monday, 16th, does she have enough samples until then? It will need a PA and I am not sure it will get approved quickly.

## 2023-04-28 NOTE — Telephone Encounter (Signed)
I spoke with pt. She does have enough samples to get her to Monday.

## 2023-05-01 ENCOUNTER — Ambulatory Visit: Payer: BC Managed Care – PPO | Admitting: Physician Assistant

## 2023-05-01 ENCOUNTER — Encounter: Payer: Self-pay | Admitting: Physician Assistant

## 2023-05-01 DIAGNOSIS — F319 Bipolar disorder, unspecified: Secondary | ICD-10-CM | POA: Diagnosis not present

## 2023-05-01 DIAGNOSIS — F4310 Post-traumatic stress disorder, unspecified: Secondary | ICD-10-CM | POA: Diagnosis not present

## 2023-05-01 DIAGNOSIS — F5105 Insomnia due to other mental disorder: Secondary | ICD-10-CM

## 2023-05-01 DIAGNOSIS — F411 Generalized anxiety disorder: Secondary | ICD-10-CM

## 2023-05-01 DIAGNOSIS — R69 Illness, unspecified: Secondary | ICD-10-CM

## 2023-05-01 DIAGNOSIS — F99 Mental disorder, not otherwise specified: Secondary | ICD-10-CM

## 2023-05-01 NOTE — Progress Notes (Signed)
[facility] Med Check  Patient ID: Ewa Kot,  MRN: [id number]  PCP: Melida Quitter, PA  Date of Evaluation: 05/01/2023 Time spent:25 minutes  Chief Complaint:  Chief Complaint   Anxiety; Depression; Insomnia; Follow-up   Virtual Visit via Telehealth  I connected with patient by telephone, with their informed consent, and verified patient privacy and that I am speaking with the correct person using two identifiers.  I am private, in my office and the patient is at home.  I discussed the limitations, risks, security and privacy concerns of performing an evaluation and management service by telephone and the availability of in person appointments. I also discussed with the patient that there may be a patient responsible charge related to this service. The patient expressed understanding and agreed to proceed.   I discussed the assessment and treatment plan with the patient. The patient was provided an opportunity to ask questions and all were answered. The patient agreed with the plan and demonstrated an understanding of the instructions.   The patient was advised to call back or seek an in-person evaluation if the symptoms worsen or if the condition fails to improve as anticipated.  I provided 25  minutes of non-face-to-face time during this encounter.  HISTORY/CURRENT STATUS: HPI  for routine med check.  We started Auvelity almost 4 weeks ago, given samples. Rx wasn't sent in until we discussed the response today. States she feels a lot better. Wants to do more things and motivation has improved. Her physical limitations from IC, FM, Chronic Fatigue, Polyarthralgia, affect her mental health, feels more hopeful and less sad right now though. Wants to stay on the Auvelity.  Sleeps well most of the time, uses Trazodone and Klonopin prn.  Anxiety is controlled.  Still gets overwhelmed, occas PA.  ADLs and personal hygiene are normal.  Appetite has not changed.  Weight is stable.   Denies suicidal or homicidal thoughts.  Patient denies increased energy with decreased need for sleep, increased talkativeness, racing thoughts, impulsivity or risky behaviors, increased spending, increased libido, grandiosity, increased irritability or anger, paranoia, or hallucinations.  Denies dizziness, syncope, seizures, tingling, tremor, tics, unsteady gait, slurred speech, confusion. Has chronic diffuse muscle pain, also lumbar pain with rad to legs, seeing pain management and ortho. Continues to f/u w/ GYN, Pulmonology, Cardiology.  Individual Medical History/ Review of Systems: Changes? :No     Past Psychiatric History:    She voluntarily committed herself in 2004 and 3 times since then. For depression. Never attempted suicide.    Past medications for mental health diagnoses include: Prozac didn't work, Effexor, Wellbutrin, Seroquel, Vraylar, Paxil, Zoloft, Cymbalta, Pristiq, Klonopin, Xanax, Trazodone, Ambien, Lunesta, Spravato started 10/2021, insurance will not pay for it, Lamictal didn't help but took it for a long time, Adderall, Lyrica caused crying   Never had ECT, TMS  Allergies: Ibuprofen, Neomycin-bacitracin-polymyxin  [bacitracin-neomycin-polymyxin], Quinolones, Atorvastatin, Bacitracin-polymyxin b, Bactroban [mupirocin], Benzalkonium chloride, Cefprozil, Cephalexin, Ciprofloxacin, Crestor [rosuvastatin], Mederma, Monistat [miconazole], Neomycin-bacitracin zn-polymyx, Polyoxyethylene 40 sorbitol septaoleate [sorbitan], Quinine, Tioconazole, Valtrex [valacyclovir hcl], and Adhesive [tape]  Current Medications:  Current Outpatient Medications:    amphetamine-dextroamphetamine (ADDERALL XR) 30 MG 24 hr capsule, Take 1 capsule (30 mg total) by mouth daily as needed. Take sparingly and not with Klonopin, Disp: 30 capsule, Rfl: 0   ASPIRIN 81 PO, Take by mouth., Disp: , Rfl:    bupivacaine (MARCAINE) 0.5 % injection, 15 mLs daily as needed. Pain in bladder, Disp: , Rfl:     cariprazine (VRAYLAR) 3  MG capsule, Take 1 capsule (3 mg total) by mouth daily., Disp: 30 capsule, Rfl: 5   Cholecalciferol 100 MCG (4000 UT) CAPS, Take 1 capsule by mouth daily., Disp: , Rfl:    clobetasol ointment (TEMOVATE) 0.05 %, APPLY SMALL AMOUNT TOPICALLY TO THE AFFECTED AREA 2 TIMES A WEEK, Disp: 60 g, Rfl: 0   clonazePAM (KLONOPIN) 1 MG tablet, TAKE 1 TABLET(1 MG) BY MOUTH THREE TIMES DAILY AS NEEDED FOR ANXIETY, Disp: 90 tablet, Rfl: 5   Dextromethorphan-buPROPion ER (AUVELITY) 45-105 MG TBCR, Take 1 tablet by mouth in the morning and the pm with 8 hours apart., Disp: 60 tablet, Rfl: 1   ELMIRON 100 MG capsule, Take 100 mg by mouth in the morning and at bedtime. Takes per bladder, Disp: , Rfl:    estradiol (VIVELLE-DOT) 0.0375 MG/24HR, Place 1 patch onto the skin 2 (two) times a week., Disp: 24 patch, Rfl: 3   Evolocumab (REPATHA SURECLICK) 140 MG/ML SOAJ, Inject 140 mg into the skin every 14 (fourteen) days., Disp: 2 mL, Rfl: 11   fluticasone-salmeterol (ADVAIR HFA) 115-21 MCG/ACT inhaler, INHALE 2 PUFFS INTO THE LUNGS TWICE DAILY, Disp: 12 g, Rfl: 2   HYDROcodone-acetaminophen (NORCO) 10-325 MG tablet, Take 1 tablet 3 times a day by oral route as needed for pain for 30 days., Disp: , Rfl:    hydrOXYzine (ATARAX/VISTARIL) 25 MG tablet, Take 50 mg by mouth at bedtime. , Disp: , Rfl:    methocarbamol (ROBAXIN) 500 MG tablet, Take 500 mg by mouth as needed., Disp: , Rfl:    nitrofurantoin (MACRODANTIN) 50 MG capsule, Take 1 capsule (50 mg total) by mouth daily., Disp: , Rfl:    pantoprazole (PROTONIX) 40 MG tablet, Take 40 mg by mouth daily., Disp: , Rfl:    progesterone (PROMETRIUM) 100 MG capsule, Take 1 capsule (100 mg total) by mouth daily., Disp: 30 capsule, Rfl: 12   sulfaSALAzine (AZULFIDINE) 500 MG EC tablet, Take 2 tablets (1,000 mg total) by mouth 2 (two) times daily., Disp: 120 tablet, Rfl: 2   traZODone (DESYREL) 100 MG tablet, Take 1 tablet (100 mg total) by mouth at bedtime.,  Disp: 90 tablet, Rfl: 5 Medication Side Effects: sexual dysfunction  Family Medical/ Social History: Changes?  No  MENTAL HEALTH EXAM:  There were no vitals taken for this visit.There is no height or weight on file to calculate BMI.  General Appearance:  unable to assess  Eye Contact:   unable to assess  Speech:  Clear and Coherent and Normal Rate  Volume:  Normal  Mood:  Euthymic  Affect:   unable to assess  Thought Process:  Goal Directed and Descriptions of Associations: Circumstantial  Orientation:  Full (Time, Place, and Person)  Thought Content: Logical   Suicidal Thoughts:  No  Homicidal Thoughts:  No  Memory:  WNL  Judgement:  Good  Insight:  Good  Psychomotor Activity: unable to assess  Concentration:  Concentration: Good and Attention Span: Good  Recall:  Good  Fund of Knowledge: Good  Language: Good  Assets:  Desire for Improvement Financial Resources/Insurance Housing Resilience Transportation  ADL's:  Intact  Cognition: WNL  Prognosis:  Good   Cardiology and PCP follows labs.   DIAGNOSES:    ICD-10-CM   1. Bipolar I disorder (HCC)  F31.9     2. Generalized anxiety disorder  F41.1     3. PTSD (post-traumatic stress disorder) - multiple traumas, stable  F43.10     4. Insomnia due to other mental  disorder  F51.05    F99     5. Multiple chronic, mostly inflammatory and autoimmune diseases  R69      Receiving Psychotherapy: Yes   with Dr. [name]  RECOMMENDATIONS:  PDMP reviewed.  Klonopin filled 04/24/2023.  Adderall filled 01/19/2023.  Also on hydrocodone, known to me. I provided 25  minutes of non-face-to-face time during this encounter, including time spent before and after the visit in records review, medical decision making, counseling pertinent to today's visit, and charting.   I'm glad to see her responding to the [facility]!  She has been on it a month now. Rx will be sent since it's effective.  Continue Adderall XR 30 mg, 1 p.o. every  morning as needed, take sparingly and not with Klonopin. Continue Vraylar 3 mg daily.  Continue Klonopin 1 mg, 1 po tid prn.   Continue Auvelity 45-105 mg bid. Cont Hydroxyzine 25 mg, 2 po qhs.  Continue Trazodone 100 mg, 1/2-1 qhs prn.  Continue therapy with Dr. [name]. Return in 3 months.    Melony Overly, PA-C

## 2023-05-05 ENCOUNTER — Other Ambulatory Visit: Payer: Self-pay

## 2023-05-05 ENCOUNTER — Other Ambulatory Visit: Payer: Self-pay | Admitting: Physician Assistant

## 2023-05-05 ENCOUNTER — Encounter: Payer: Self-pay | Admitting: Physician Assistant

## 2023-05-05 ENCOUNTER — Ambulatory Visit (INDEPENDENT_AMBULATORY_CARE_PROVIDER_SITE_OTHER): Payer: BC Managed Care – PPO | Admitting: Psychiatry

## 2023-05-05 DIAGNOSIS — F411 Generalized anxiety disorder: Secondary | ICD-10-CM

## 2023-05-05 DIAGNOSIS — F319 Bipolar disorder, unspecified: Secondary | ICD-10-CM

## 2023-05-05 DIAGNOSIS — R69 Illness, unspecified: Secondary | ICD-10-CM | POA: Diagnosis not present

## 2023-05-05 MED ORDER — AUVELITY 45-105 MG PO TBCR: EXTENDED_RELEASE_TABLET | ORAL | 1 refills | Status: DC
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
## 2023-05-08 ENCOUNTER — Encounter: Payer: Self-pay | Admitting: Internal Medicine

## 2023-05-08 NOTE — Telephone Encounter (Signed)
Patient is currently prescribed sulfasalazine and macrodantin. Please advise.

## 2023-05-10 ENCOUNTER — Telehealth: Payer: Self-pay

## 2023-05-10 NOTE — Telephone Encounter (Signed)
Prior Authorization submitted and approved for Auvelity 45-105 mg with [employer] effective 05/07/23-05/06/2024. Reference # [id number]

## 2023-05-10 NOTE — Telephone Encounter (Signed)
See previous message PA is approved

## 2023-05-13 ENCOUNTER — Encounter: Payer: Self-pay | Admitting: Internal Medicine

## 2023-05-16 ENCOUNTER — Encounter: Payer: Self-pay | Admitting: Internal Medicine

## 2023-05-23 ENCOUNTER — Encounter: Payer: Self-pay | Admitting: Allergy and Immunology

## 2023-05-23 ENCOUNTER — Other Ambulatory Visit: Payer: Self-pay

## 2023-05-23 ENCOUNTER — Encounter: Payer: Self-pay | Admitting: Internal Medicine

## 2023-05-23 ENCOUNTER — Ambulatory Visit: Payer: BC Managed Care – PPO | Admitting: Allergy and Immunology

## 2023-05-23 VITALS — BP 122/80 | HR 92 | Temp 97.9°F | Resp 16 | Ht 64.0 in | Wt 156.6 lb

## 2023-05-23 DIAGNOSIS — K219 Gastro-esophageal reflux disease without esophagitis: Secondary | ICD-10-CM | POA: Diagnosis not present

## 2023-05-23 DIAGNOSIS — J4540 Moderate persistent asthma, uncomplicated: Secondary | ICD-10-CM

## 2023-05-23 DIAGNOSIS — J3089 Other allergic rhinitis: Secondary | ICD-10-CM

## 2023-05-23 DIAGNOSIS — Z888 Allergy status to other drugs, medicaments and biological substances status: Secondary | ICD-10-CM | POA: Diagnosis not present

## 2023-05-23 DIAGNOSIS — J310 Chronic rhinitis: Secondary | ICD-10-CM

## 2023-05-23 MED ORDER — LORATADINE 10 MG PO TABS: 10.0000 mg | ORAL_TABLET | Freq: Every day | ORAL | 0 refills | Status: AC

## 2023-05-23 MED ORDER — RYALTRIS 665-25 MCG/ACT NA SUSP: 2.0000 | Freq: Two times a day (BID) | NASAL | 0 refills | Status: DC

## 2023-05-23 MED ORDER — LEVALBUTEROL TARTRATE 45 MCG/ACT IN AERO
2.0000 | INHALATION_SPRAY | Freq: Four times a day (QID) | RESPIRATORY_TRACT | 0 refills | Status: AC | PRN
Start: 2023-05-23 — End: ?

## 2023-05-23 MED ORDER — FAMOTIDINE 40 MG PO TABS: 40.0000 mg | ORAL_TABLET | Freq: Every day | ORAL | 0 refills | Status: AC

## 2023-05-23 MED ORDER — ASMANEX (60 METERED DOSES) 220 MCG/ACT IN AEPB: 1.0000 | INHALATION_SPRAY | Freq: Two times a day (BID) | RESPIRATORY_TRACT | 0 refills | Status: DC

## 2023-05-23 MED ORDER — PANTOPRAZOLE SODIUM 40 MG PO TBEC: 40.0000 mg | DELAYED_RELEASE_TABLET | Freq: Two times a day (BID) | ORAL | 0 refills | Status: DC
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
## 2023-05-23 NOTE — Patient Instructions (Addendum)
  1. Treat inflammation of airway:   A. Ryaltris  - 2 sprays each nostril 2 times per day  B. Asmanex  200 Twisthaler - 1 inhalation 2 times per day  2. Treat reflux induced inflammation of airway:   A. Protonix  40 mg - 1 tablet 2 times per day  B. Famotidine  40 mg - 1 tablet in evening  C. Slowly decrease caffeine and chocolate consumption  D. Replace throat clearing with swallowing/drinking maneuver  3. If needed:   A. Xopenex  - 2 inhalation every 6 hours  B. Mucinex DM - 1 tablet 2 times per day  C. Loratadine  10 mg - 1 tablet 1 time per day  4. Stop oxymetazoline nasal spray  5. Discuss with Dr. Jeannetta about alternative for sulfasalazine   6. Return to clinic in 4 weeks or earlier if problem

## 2023-05-24 ENCOUNTER — Other Ambulatory Visit (HOSPITAL_COMMUNITY): Payer: Self-pay

## 2023-05-24 ENCOUNTER — Telehealth: Payer: Self-pay

## 2023-05-24 ENCOUNTER — Encounter: Payer: Self-pay | Admitting: Allergy and Immunology

## 2023-05-24 NOTE — Telephone Encounter (Signed)
 Pharmacy Patient Advocate Encounter   Received notification from CoverMyMeds that prior authorization for Levalbuterol  Tartrate 45MCG/ACT aerosol is required/requested.   Insurance verification completed.   The patient is insured through [facility] .   Per test claim: PA required; PA submitted to above mentioned insurance via CoverMyMeds Key/confirmation #/EOC [id number] Status is pending

## 2023-05-29 NOTE — Telephone Encounter (Signed)
 Is there any reason pt can not take albuterol? Pa for xopenex was denied

## 2023-05-29 NOTE — Telephone Encounter (Signed)
 Pharmacy Patient Advocate Encounter  Received notification from [facility] that Prior Authorization for Levalbuterol  Tartrate 45MCG/ACT aerosol has been DENIED.  Full denial letter will be uploaded to the media tab. See denial reason below.  This medication is not on the formulary. The member must try and fail (did not work), or be unable to take ALL formulary alternatives (due to interactions, side effects, etc.) In this case, other formulary alternatives are available for the member to take. Alternative medications include: brand Ventolin  HFA, albuterol  HFA (generic ProAir ), and albuterol  (generic Proventil )  PA #/Case ID/Reference #: [id number]

## 2023-05-30 ENCOUNTER — Telehealth: Payer: Self-pay

## 2023-05-30 ENCOUNTER — Ambulatory Visit (HOSPITAL_BASED_OUTPATIENT_CLINIC_OR_DEPARTMENT_OTHER): Payer: BC Managed Care – PPO | Admitting: Obstetrics & Gynecology
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
## 2023-05-30 NOTE — Telephone Encounter (Signed)
 Pharmacy Patient Advocate Encounter   Received notification from Physician's Office that prior authorization resubmission for Levalbuterol  Tartrate 45MCG/ACT aerosol is required/requested.   Insurance verification completed.   The patient is insured through [facility] .   Per test claim: PA required; PA submitted to above mentioned insurance via CoverMyMeds Key/confirmation #/EOC [id number] Status is pending

## 2023-05-31 ENCOUNTER — Ambulatory Visit: Payer: BC Managed Care – PPO | Attending: Internal Medicine | Admitting: Internal Medicine

## 2023-05-31 ENCOUNTER — Other Ambulatory Visit (HOSPITAL_COMMUNITY): Payer: Self-pay

## 2023-05-31 ENCOUNTER — Encounter: Payer: Self-pay | Admitting: Internal Medicine

## 2023-05-31 VITALS — BP 124/82 | HR 72 | Resp 14 | Ht 64.0 in | Wt 159.0 lb

## 2023-05-31 DIAGNOSIS — M7062 Trochanteric bursitis, left hip: Secondary | ICD-10-CM

## 2023-05-31 DIAGNOSIS — M1380 Other specified arthritis, unspecified site: Secondary | ICD-10-CM | POA: Diagnosis not present

## 2023-05-31 DIAGNOSIS — M7061 Trochanteric bursitis, right hip: Secondary | ICD-10-CM | POA: Diagnosis not present

## 2023-05-31 DIAGNOSIS — L900 Lichen sclerosus et atrophicus: Secondary | ICD-10-CM

## 2023-05-31 DIAGNOSIS — M797 Fibromyalgia: Secondary | ICD-10-CM

## 2023-05-31 DIAGNOSIS — Z79899 Other long term (current) drug therapy: Secondary | ICD-10-CM | POA: Diagnosis not present

## 2023-05-31 MED ORDER — TUBERCULIN SYRINGE 26G X 3/8" 1 ML MISC: 0 refills | Status: DC

## 2023-05-31 MED ORDER — FOLIC ACID 1 MG PO TABS: 1.0000 mg | ORAL_TABLET | Freq: Every day | ORAL | 0 refills | Status: DC

## 2023-05-31 MED ORDER — METHOTREXATE 25 MG/ML ~~LOC~~ SOSY: 12.5000 mg | PREFILLED_SYRINGE | SUBCUTANEOUS | 1 refills | Status: DC

## 2023-05-31 NOTE — Patient Instructions (Signed)
 Methotrexate Injection What is this medication? METHOTREXATE (METH oh TREX ate) treats inflammatory conditions such as arthritis and psoriasis. It works by decreasing inflammation, which can reduce pain and prevent long-term injury to the joints and skin. It may also be used to treat some types of cancer. It works by slowing down the growth of cancer cells. This medicine may be used for other purposes; ask your health care provider or pharmacist if you have questions. What should I tell my care team before I take this medication? They need to know if you have any of these conditions: Fluid in the stomach area or lungs Frequently drink alcohol Infection or immune system problems Kidney disease Liver disease Low blood counts (white cells, platelets, or red blood cells) Lung disease Recent or ongoing radiation Recent or upcoming vaccine Stomach ulcers Ulcerative colitis An unusual or allergic reaction to methotrexate, other medications, foods, dyes, or preservatives Pregnant or trying to get pregnant Breastfeeding How should I use this medication? This medication is for infusion into a vein or for injection into muscle or into the spinal fluid (whichever applies). It is usually given in a hospital or clinic setting. In rare cases, you might get this medication at home. You will be taught how to give this medication. Use exactly as directed. Take your medication at regular intervals. Do not take your medication more often than directed. If this medication is used for arthritis or psoriasis, it should be taken weekly, NOT daily. It is important that you put your used needles and syringes in a special sharps container. Do not put them in a trash can. If you do not have a sharps container, call your pharmacist or care team to get one. Talk to your care team about the use of this medication in children. While this medication may be prescribed for children as young as 2 years for selected conditions,  precautions do apply. Overdosage: If you think you have taken too much of this medicine contact a poison control center or emergency room at once. NOTE: This medicine is only for you. Do not share this medicine with others. What if I miss a dose? It is important not to miss your dose. Call your care team if you are unable to keep an appointment. If you give yourself the medication, and you miss a dose, talk with your care team. Do not take double or extra doses. What may interact with this medication? Do not take this medication with any of the following: Acitretin Probenecid This medication may also interact with the following: Aspirin or aspirin-like medications Azathioprine Certain antibiotics, such as gentamicin, penicillin, tetracycline, vancomycin Certain medications that treat or prevent blood clots, such as warfarin, apixaban, dabigatran, rivaroxaban Certain medications for stomach problems, such as esomeprazole, omeprazole, pantoprazole Dapsone Hydroxychloroquine Live virus vaccines Medications for viral infections, such as acyclovir, cidofovir, foscarnet, ganciclovir Mercaptopurine NSAIDs, medications for pain and inflammation, such as ibuprofen or naproxen Phenytoin Pyrimethamine Retinoids, such as isotretinoin or tretinoin Sulfonamides, such as sulfasalazine or trimethoprim; sulfamethoxazole Theophylline This list may not describe all possible interactions. Give your health care provider a list of all the medicines, herbs, non-prescription drugs, or dietary supplements you use. Also tell them if you smoke, drink alcohol, or use illegal drugs. Some items may interact with your medicine. What should I watch for while using this medication? This medication may make you feel generally unwell. This is not uncommon as chemotherapy can affect healthy cells as well as cancer cells. Report any side effects.  Continue your course of treatment even though you feel ill unless your care  team tells you to stop. Your condition will be monitored carefully while you are receiving this medication. Avoid alcoholic drinks. This medication can cause serious side effects. To reduce the risk, your care team may give you other medications to take before receiving this one. Be sure to follow the directions from your care team. This medication can make you more sensitive to the sun. Keep out of the sun. If you cannot avoid being in the sun, wear protective clothing and use sunscreen. Do not use sun lamps or tanning beds/booths. You may get drowsy or dizzy. Do not drive, use machinery, or do anything that needs mental alertness until you know how this medication affects you. Do not stand or sit up quickly, especially if you are an older patient. This reduces the risk of dizzy or fainting spells. You may need blood work while you are taking this medication. Call your care team for advice if you get a fever, chills or sore throat, or other symptoms of a cold or flu. Do not treat yourself. This medication decreases your body's ability to fight infections. Try to avoid being around people who are sick. This medication may increase your risk to bruise or bleed. Call your care team if you notice any unusual bleeding. Be careful brushing or flossing your teeth or using a toothpick because you may get an infection or bleed more easily. If you have any dental work done, tell your dentist you are receiving this medication Check with your care team if you get an attack of severe diarrhea, nausea and vomiting, or if you sweat a lot. The loss of too much body fluid can make it dangerous for you to take this medication. Talk to your care team about your risk of cancer. You may be more at risk for certain types of cancers if you take this medication. Do not become pregnant while taking this medication or for 6 months after stopping it. Women should inform their care team if they wish to become pregnant or think  they might be pregnant. Men should not father a child while taking this medication and for 3 months after stopping it. There is potential for serious harm to an unborn child. Talk to your care team for more information. Do not breast-feed an infant while taking this medication or for 1 week after stopping it. This medication may make it more difficult to get pregnant or father a child. Talk to your care team if you are concerned about your fertility. What side effects may I notice from receiving this medication? Side effects that you should report to your care team as soon as possible: Allergic reactions--skin rash, itching, hives, swelling of the face, lips, tongue, or throat Blood clot--pain, swelling, or warmth in the leg, shortness of breath, chest pain Dry cough, shortness of breath or trouble breathing Infection--fever, chills, cough, sore throat, wounds that don't heal, pain or trouble when passing urine, general feeling of discomfort or being unwell Kidney injury--decrease in the amount of urine, swelling of the ankles, hands, or feet Liver injury--right upper belly pain, loss of appetite, nausea, light-colored stool, dark yellow or brown urine, yellowing of the skin or eyes, unusual weakness or fatigue Low red blood cell count--unusual weakness or fatigue, dizziness, headache, trouble breathing Redness, blistering, peeling, or loosening of the skin, including inside the mouth Seizures Unusual bruising or bleeding Side effects that usually do not require medical  attention (report to your care team if they continue or are bothersome): Diarrhea Dizziness Hair loss Nausea Pain, redness, or swelling with sores inside the mouth or throat Vomiting This list may not describe all possible side effects. Call your doctor for medical advice about side effects. You may report side effects to FDA at 1-800-FDA-1088. Where should I keep my medication? This medication is given in a hospital or clinic.  It will not be stored at home. NOTE: This sheet is a summary. It may not cover all possible information. If you have questions about this medicine, talk to your doctor, pharmacist, or health care provider.  2024 Elsevier/Gold Standard (2022-10-05 00:00:00)

## 2023-05-31 NOTE — Telephone Encounter (Signed)
 Pharmacy Patient Advocate Encounter  Received notification from [facility] that Prior Authorization for Levalbuterol  HFA has been APPROVED from 05/24/2023 to 05/23/2024. Unable to obtain price due to refill too soon rejection, last fill date 05/31/2023 next available fill date02/07/2023

## 2023-06-01 LAB — COMPLETE METABOLIC PANEL WITH GFR
AG Ratio: 1.9 (calc) (ref 1.0–2.5)
ALT: 8 U/L (ref 6–29)
AST: 12 U/L (ref 10–35)
Albumin: 4.4 g/dL (ref 3.6–5.1)
Alkaline phosphatase (APISO): 78 U/L (ref 37–153)
BUN: 13 mg/dL (ref 7–25)
CO2: 31 mmol/L (ref 20–32)
Calcium: 9.7 mg/dL (ref 8.6–10.4)
Chloride: 102 mmol/L (ref 98–110)
Creat: 0.71 mg/dL (ref 0.50–1.03)
Globulin: 2.3 g/dL (ref 1.9–3.7)
Glucose, Bld: 84 mg/dL (ref 65–139)
Potassium: 4.4 mmol/L (ref 3.5–5.3)
Sodium: 140 mmol/L (ref 135–146)
Total Bilirubin: 0.4 mg/dL (ref 0.2–1.2)
Total Protein: 6.7 g/dL (ref 6.1–8.1)
eGFR: 100 mL/min/{1.73_m2} (ref >=60)

## 2023-06-01 LAB — CBC WITH DIFFERENTIAL/PLATELET
Absolute Lymphocytes: 2496 {cells}/uL (ref 850–3900)
Absolute Monocytes: 429 {cells}/uL (ref 200–950)
Basophils Absolute: 59 {cells}/uL (ref 0–200)
Basophils Relative: 0.9 %
Eosinophils Absolute: 98 {cells}/uL (ref 15–500)
Eosinophils Relative: 1.5 %
HCT: 37.3 % (ref 35.0–45.0)
Hemoglobin: 12 g/dL (ref 11.7–15.5)
MCH: 30.5 pg (ref 27.0–33.0)
MCHC: 32.2 g/dL (ref 32.0–36.0)
MCV: 94.7 fL (ref 80.0–100.0)
MPV: 10 fL (ref 7.5–12.5)
Monocytes Relative: 6.6 %
Neutro Abs: 3419 {cells}/uL (ref 1500–7800)
Neutrophils Relative %: 52.6 %
Platelets: 269 10*3/uL (ref 140–400)
RBC: 3.94 10*6/uL (ref 3.80–5.10)
RDW: 11.8 % (ref 11.0–15.0)
Total Lymphocyte: 38.4 %
WBC: 6.5 10*3/uL (ref 3.8–10.8)

## 2023-06-01 LAB — C-REACTIVE PROTEIN: CRP: 10.6 mg/L — ABNORMAL HIGH (ref <8.0)

## 2023-06-02 NOTE — Progress Notes (Signed)
CRP of 10.6 is increased consistent with active inflammation. Blood count and metabolic panel are normal. No problem for starting methotrexate as planned.

## 2023-06-20 ENCOUNTER — Encounter: Payer: Self-pay | Admitting: Allergy and Immunology

## 2023-06-20 ENCOUNTER — Ambulatory Visit: Payer: BC Managed Care – PPO | Admitting: Allergy and Immunology

## 2023-06-20 VITALS — BP 118/76 | HR 74 | Temp 98.0°F | Resp 12

## 2023-06-20 DIAGNOSIS — J4540 Moderate persistent asthma, uncomplicated: Secondary | ICD-10-CM | POA: Diagnosis not present

## 2023-06-20 DIAGNOSIS — Z888 Allergy status to other drugs, medicaments and biological substances status: Secondary | ICD-10-CM

## 2023-06-20 DIAGNOSIS — K219 Gastro-esophageal reflux disease without esophagitis: Secondary | ICD-10-CM | POA: Diagnosis not present

## 2023-06-20 DIAGNOSIS — J3089 Other allergic rhinitis: Secondary | ICD-10-CM | POA: Diagnosis not present

## 2023-06-20 DIAGNOSIS — J310 Chronic rhinitis: Secondary | ICD-10-CM

## 2023-06-20 DIAGNOSIS — T485X5A Adverse effect of other anti-common-cold drugs, initial encounter: Secondary | ICD-10-CM

## 2023-06-20 NOTE — Patient Instructions (Signed)
  1. Treat inflammation of airway:   A. Ryaltris  - 2 sprays + Afrin - 1 spray each nostril 2 times per day  B. Asmanex  200 Twisthaler - 1 inhalation 2 times per day  2. Treat reflux induced inflammation of airway:   A. Protonix  40 mg - 1 tablet 1 time per day  B. Famotidine  40 mg - 1 tablet in evening  C. Slowly decrease caffeine and chocolate consumption  D. Replace throat clearing with swallowing/drinking maneuver  3. If needed:   A. Xopenex  - 2 inhalation every 6 hours  B. Mucinex DM - 1 tablet 2 times per day  C. Loratadine  10 mg - 1 tablet 1 time per day  4. Influenza = Tamiflu. Covid = Paxlovid  5. Return to clinic in 12 weeks or earlier if problem. Taper medications???
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
## 2023-06-21 ENCOUNTER — Encounter: Payer: Self-pay | Admitting: Allergy and Immunology

## 2023-06-22 ENCOUNTER — Encounter: Payer: Self-pay | Admitting: Family Medicine

## 2023-07-04 ENCOUNTER — Other Ambulatory Visit (HOSPITAL_BASED_OUTPATIENT_CLINIC_OR_DEPARTMENT_OTHER): Payer: Self-pay | Admitting: *Deleted

## 2023-07-04 ENCOUNTER — Ambulatory Visit: Payer: BC Managed Care – PPO | Admitting: Internal Medicine

## 2023-07-04 ENCOUNTER — Other Ambulatory Visit: Payer: Self-pay | Admitting: Obstetrics and Gynecology

## 2023-07-04 ENCOUNTER — Telehealth: Payer: Self-pay | Admitting: *Deleted

## 2023-07-04 MED ORDER — PROGESTERONE MICRONIZED 100 MG PO CAPS: 100.0000 mg | ORAL_CAPSULE | Freq: Every day | ORAL | 0 refills | Status: DC

## 2023-07-04 NOTE — Telephone Encounter (Signed)
 Patient contacted the office stating she has been on MTX. Patient states she has taken 4 injections. Patient states she is feeling worse and that her indigestion has gotten worse. Please advise.

## 2023-07-04 NOTE — Telephone Encounter (Signed)
 Medication refill request: progesterone  Last AEX:  05/31/22 Next AEX: 07/13/23 with Dr. Ashley Royalty at [facility]  Last MMG (if hormonal medication request): 11/11/22 bi-rads 1 neg  Refill authorized: please advise
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
## 2023-07-14 ENCOUNTER — Other Ambulatory Visit: Payer: Self-pay | Admitting: Allergy and Immunology

## 2023-07-18 ENCOUNTER — Encounter: Payer: Self-pay | Admitting: Family Medicine

## 2023-07-18 ENCOUNTER — Ambulatory Visit (INDEPENDENT_AMBULATORY_CARE_PROVIDER_SITE_OTHER): Payer: BC Managed Care – PPO | Admitting: Family Medicine

## 2023-07-18 VITALS — BP 104/71 | HR 71 | Ht 64.0 in | Wt 163.0 lb

## 2023-07-18 DIAGNOSIS — H9311 Tinnitus, right ear: Secondary | ICD-10-CM | POA: Diagnosis not present

## 2023-07-18 DIAGNOSIS — Z0000 Encounter for general adult medical examination without abnormal findings: Secondary | ICD-10-CM | POA: Diagnosis not present

## 2023-07-18 NOTE — Assessment & Plan Note (Signed)
 External ear examination within normal limits, suspect inner ear etiology.  Discussed referral to ENT, patient would like to wait at this time.

## 2023-07-18 NOTE — Patient Instructions (Addendum)
 I am glad that you will be meeting with your therapist, that is such an important step in caring for yourself. Please let us know if you are interested in further supporting yourself with medication options and we can discuss all the options! I am going to have them schedule a follow up for you in about 3 months so you can meet [place] and check in with her as well.
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
## 2023-07-19 ENCOUNTER — Ambulatory Visit
Admission: EM | Admit: 2023-07-19 | Discharge: 2023-07-19 | Disposition: A | Discharge status: Home / Self Care | Attending: Family Medicine | Admitting: Family Medicine

## 2023-07-19 ENCOUNTER — Ambulatory Visit: Payer: Self-pay | Admitting: Family Medicine

## 2023-07-19 DIAGNOSIS — N3090 Cystitis, unspecified without hematuria: Secondary | ICD-10-CM | POA: Diagnosis not present

## 2023-07-19 LAB — POCT URINALYSIS DIP (MANUAL ENTRY)
Bilirubin, UA: NEGATIVE
Glucose, UA: NEGATIVE mg/dL
Ketones, POC UA: NEGATIVE mg/dL
Nitrite, UA: NEGATIVE
Protein Ur, POC: 30 mg/dL — AB
Spec Grav, UA: 1.02 (ref 1.010–1.025)
Urobilinogen, UA: 0.2 U/dL
pH, UA: 6 (ref 5.0–8.0)

## 2023-07-19 MED ORDER — LIDOCAINE VISCOUS HCL 2 % MT SOLN
15.0000 mL | Freq: Once | OROMUCOSAL | Status: AC
  Administered 2023-07-19: 15 mL via OROMUCOSAL

## 2023-07-19 MED ORDER — ALUM & MAG HYDROXIDE-SIMETH 200-200-20 MG/5ML PO SUSP
30.0000 mL | Freq: Once | ORAL | Status: AC
  Administered 2023-07-19: 30 mL via ORAL

## 2023-07-19 MED ORDER — CEPHALEXIN 500 MG PO CAPS: 500.0000 mg | ORAL_CAPSULE | Freq: Two times a day (BID) | ORAL | 0 refills | Status: DC

## 2023-07-19 NOTE — ED Triage Notes (Signed)
"  This morning I woke up and my first urine hurt (very serious acute pain before/after) & this hast gotten worse". "Also some lower abd discomfort, no pain". No nausea or vomiting. No fever known.

## 2023-07-19 NOTE — Telephone Encounter (Signed)
 Copied from CRM [id number]. Topic: Clinical - Red Word Triage >> Jul 19, 2023  2:40 PM Erika Mccoy wrote: Red Word that prompted transfer to Nurse Triage: Patient was just seen yesterday and today is experiencing painful urination and is worried about uti Reason for Disposition  [1] SEVERE pain with urination (e.g., excruciating) AND [2] not improved after 2 hours of pain medicine and Sitz bath  Answer Assessment - Initial Assessment Questions 1. SEVERITY: "How bad is the pain?"  (e.g., Scale 1-10; mild, moderate, or severe)   - MILD (1-3): complains slightly about urination hurting   - MODERATE (4-7): interferes with normal activities     - SEVERE (8-10): excruciating, unwilling or unable to urinate because of the pain      10 2. FREQUENCY: "How many times have you had painful urination today?"      2 3. PATTERN: "Is pain present every time you urinate or just sometimes?"      Painful during and after 4. ONSET: "When did the painful urination start?"      This am  5. FEVER: "Do you have a fever?" If Yes, ask: "What is your temperature, how was it measured, and when did it start?"     no 6. PAST UTI: "Have you had a urine infection before?" If Yes, ask: "When was the last time?" and "What happened that time?"      yes 7. CAUSE: "What do you think is causing the painful urination?"  (e.g., UTI, scratch, Herpes sore)     UTI  8. OTHER SYMPTOMS: "Do you have any other symptoms?" (e.g., blood in urine, flank pain, genital sores, urgency, vaginal discharge)     Urgency- feels like bladder spasms stinging also  9. PREGNANCY: "Is there any chance you are pregnant?" "When was your last menstrual period?"     N/a  Protocols used: Urination Pain - Female-A-AH
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
## 2023-07-19 NOTE — Discharge Instructions (Addendum)
 You have had labs (urine culture) sent today. We will call you with any significant abnormalities or if there is need to begin or change treatment or pursue further follow up.  You may also review your test results online through MyChart. If you do not have a MyChart account, instructions to sign up should be on your discharge paperwork.  Meds ordered this encounter  Medications   alum & mag hydroxide-simeth (MAALOX/MYLANTA) 200-200-20 MG/5ML suspension 30 mL   lidocaine (XYLOCAINE) 2 % viscous mouth solution 15 mL   cephALEXin (KEFLEX) 500 MG capsule    Sig: Take 1 capsule (500 mg total) by mouth 2 (two) times daily.    Dispense:  10 capsule    Refill:  0

## 2023-07-25 ENCOUNTER — Encounter: Payer: Self-pay | Admitting: Internal Medicine

## 2023-07-25 ENCOUNTER — Ambulatory Visit: Payer: BC Managed Care – PPO | Attending: Internal Medicine | Admitting: Internal Medicine

## 2023-07-25 VITALS — BP 120/78 | HR 73 | Resp 14 | Ht 64.0 in | Wt 165.0 lb

## 2023-07-25 DIAGNOSIS — K589 Irritable bowel syndrome without diarrhea: Secondary | ICD-10-CM

## 2023-07-25 DIAGNOSIS — Z79899 Other long term (current) drug therapy: Secondary | ICD-10-CM

## 2023-07-25 DIAGNOSIS — M1380 Other specified arthritis, unspecified site: Secondary | ICD-10-CM | POA: Diagnosis not present

## 2023-07-25 DIAGNOSIS — M7061 Trochanteric bursitis, right hip: Secondary | ICD-10-CM

## 2023-07-25 DIAGNOSIS — K219 Gastro-esophageal reflux disease without esophagitis: Secondary | ICD-10-CM

## 2023-07-25 DIAGNOSIS — M7062 Trochanteric bursitis, left hip: Secondary | ICD-10-CM

## 2023-07-25 DIAGNOSIS — R809 Proteinuria, unspecified: Secondary | ICD-10-CM | POA: Insufficient documentation

## 2023-07-25 DIAGNOSIS — L900 Lichen sclerosus et atrophicus: Secondary | ICD-10-CM

## 2023-07-25 DIAGNOSIS — M797 Fibromyalgia: Secondary | ICD-10-CM

## 2023-07-25 DIAGNOSIS — J45909 Unspecified asthma, uncomplicated: Secondary | ICD-10-CM

## 2023-07-26 LAB — COMPLETE METABOLIC PANEL WITH GFR
AG Ratio: 1.6 (calc) (ref 1.0–2.5)
ALT: 14 U/L (ref 6–29)
AST: 12 U/L (ref 10–35)
Albumin: 4.4 g/dL (ref 3.6–5.1)
Alkaline phosphatase (APISO): 88 U/L (ref 37–153)
BUN: 12 mg/dL (ref 7–25)
CO2: 31 mmol/L (ref 20–32)
Calcium: 9.4 mg/dL (ref 8.6–10.4)
Chloride: 103 mmol/L (ref 98–110)
Creat: 0.81 mg/dL (ref 0.50–1.03)
Globulin: 2.8 g/dL (ref 1.9–3.7)
Glucose, Bld: 86 mg/dL (ref 65–99)
Potassium: 4.7 mmol/L (ref 3.5–5.3)
Sodium: 141 mmol/L (ref 135–146)
Total Bilirubin: 0.4 mg/dL (ref 0.2–1.2)
Total Protein: 7.2 g/dL (ref 6.1–8.1)
eGFR: 85 mL/min/{1.73_m2} (ref >=60)

## 2023-07-26 LAB — C-REACTIVE PROTEIN: CRP: 5.7 mg/L (ref <8.0)

## 2023-07-26 LAB — PROTEIN / CREATININE RATIO, URINE
Creatinine, Urine: 140 mg/dL (ref 20–275)
Protein/Creat Ratio: 71 mg/g{creat} (ref 24–184)
Protein/Creatinine Ratio: 0.071 mg/mg{creat} (ref 0.024–0.184)
Total Protein, Urine: 10 mg/dL (ref 5–24)

## 2023-07-26 LAB — CBC WITH DIFFERENTIAL/PLATELET
Absolute Lymphocytes: 3272 {cells}/uL (ref 850–3900)
Absolute Monocytes: 488 {cells}/uL (ref 200–950)
Basophils Absolute: 80 {cells}/uL (ref 0–200)
Basophils Relative: 1 %
Eosinophils Absolute: 104 {cells}/uL (ref 15–500)
Eosinophils Relative: 1.3 %
HCT: 38.3 % (ref 35.0–45.0)
Hemoglobin: 13 g/dL (ref 11.7–15.5)
MCH: 30.3 pg (ref 27.0–33.0)
MCHC: 33.9 g/dL (ref 32.0–36.0)
MCV: 89.3 fL (ref 80.0–100.0)
MPV: 9.3 fL (ref 7.5–12.5)
Monocytes Relative: 6.1 %
Neutro Abs: 4056 {cells}/uL (ref 1500–7800)
Neutrophils Relative %: 50.7 %
Platelets: 294 10*3/uL (ref 140–400)
RBC: 4.29 10*6/uL (ref 3.80–5.10)
RDW: 12.6 % (ref 11.0–15.0)
Total Lymphocyte: 40.9 %
WBC: 8 10*3/uL (ref 3.8–10.8)

## 2023-07-31 ENCOUNTER — Telehealth: Payer: Self-pay | Admitting: Physician Assistant

## 2023-07-31 MED ORDER — AUVELITY 45-105 MG PO TBCR: EXTENDED_RELEASE_TABLET | ORAL | 1 refills | Status: DC

## 2023-07-31 NOTE — Telephone Encounter (Signed)
 Auvelity RF sent to [facility]

## 2023-07-31 NOTE — Telephone Encounter (Signed)
 Pt called asking for a refill on her auvelity 45-105. She is leaving on the  20th of march to go [place]. [facility] on [address]

## 2023-08-01 NOTE — Progress Notes (Signed)
 Lab results look good there is no increased protein in the urine.  CRP is normal does not indicate increased inflammation.  Blood count and kidney and liver function tests are also normal.

## 2023-08-17 ENCOUNTER — Telehealth (HOSPITAL_BASED_OUTPATIENT_CLINIC_OR_DEPARTMENT_OTHER): Payer: Self-pay | Admitting: *Deleted

## 2023-08-17 NOTE — Telephone Encounter (Signed)
 Pt called with complaints of vaginal burning and pain with intercourse. She is unsure if this is related to her lichen sclerosis but feels that maybe it is not due to the fact that the burning sensation is more internal. Offered to move patients appt for her annual exam to Monday 08/21/23 from 09/06/23. Pt declined. She states that she does not like to do appts on Mondays. Pt states that she will talk with provider about her concerns when she comes in on 4/23.

## 2023-08-18 ENCOUNTER — Ambulatory Visit: Admitting: Physician Assistant

## 2023-08-21 ENCOUNTER — Other Ambulatory Visit: Payer: Self-pay | Admitting: Internal Medicine

## 2023-08-21 ENCOUNTER — Other Ambulatory Visit: Payer: Self-pay | Admitting: Allergy and Immunology

## 2023-08-21 DIAGNOSIS — M1380 Other specified arthritis, unspecified site: Secondary | ICD-10-CM

## 2023-08-21 NOTE — Telephone Encounter (Signed)
 Last Fill: 06/01/2023  Labs: 07/25/2023 Lab results look good there is no increased protein in the urine.  CRP is normal does not indicate increased inflammation.  Blood count and kidney and liver function tests are also normal.   Next Visit: 09/26/2023  Last Visit: 07/25/2023  DX: Seronegative inflammatory arthritis   Current Dose per office note 07/25/2023: methotrexate 12.5 mg [place] weekly   Okay to refill Methotrexate?

## 2023-08-25 ENCOUNTER — Other Ambulatory Visit: Payer: Self-pay | Admitting: Allergy and Immunology

## 2023-08-25 ENCOUNTER — Encounter: Payer: Self-pay | Admitting: Family Medicine

## 2023-08-25 ENCOUNTER — Telehealth: Payer: Self-pay | Admitting: *Deleted

## 2023-08-25 ENCOUNTER — Ambulatory Visit: Admitting: Family Medicine

## 2023-08-25 VITALS — BP 108/75 | HR 81 | Ht 64.0 in | Wt 163.0 lb

## 2023-08-25 DIAGNOSIS — R300 Dysuria: Secondary | ICD-10-CM

## 2023-08-25 LAB — POCT URINALYSIS DIP (CLINITEK)
Bilirubin, UA: NEGATIVE
Glucose, UA: NEGATIVE mg/dL
Ketones, POC UA: NEGATIVE mg/dL
Nitrite, UA: NEGATIVE
POC PROTEIN,UA: NEGATIVE
Spec Grav, UA: 1.025 (ref 1.010–1.025)
Urobilinogen, UA: 0.2 U/dL
pH, UA: 5.5 (ref 5.0–8.0)

## 2023-08-25 MED ORDER — SULFAMETHOXAZOLE-TRIMETHOPRIM 800-160 MG PO TABS
1.0000 | ORAL_TABLET | Freq: Two times a day (BID) | ORAL | 0 refills | Status: AC
Start: 2023-08-25 — End: 2023-08-28

## 2023-08-25 NOTE — Telephone Encounter (Signed)
 Copied from CRM [id number]. Topic: Clinical - Prescription Issue >> Aug 25, 2023 12:33 PM Franchot Heidelberg wrote: Reason for CRM: [facility] calling regarding a drug interaction between   sulfamethoxazole-trimethoprim (BACTRIM DS) 800-160 MG tablet   And   methotrexate 50 MG/2ML injection   [address] - [place], [place] - [address] [place] [address] Phone: [phone] Fax: [phone]

## 2023-08-25 NOTE — Progress Notes (Signed)
   Acute Office Visit  Subjective:     Patient ID: Erika Mccoy, female    DOB: 08/23/1965, 58 y.o.   MRN: [id number]  Chief Complaint  Patient presents with   Urinary Tract Infection    HPI Patient is in today for  uti symptoms.  She recently saw the urgent care for dysuria and completed Keflex approximately 1 month ago.  Still having burning at the urethral opening as well as a feeling of "deeper" burning.  Feels like urine looks "cloudy".  Also feels like there may be a slight odor.  Has a history of interstitial cystitis and sees urology for this.  Currently uses oxycodone, Klonopin, vaginal Valium, hydroxyzine, Uribel as needed, Elmiron, Macrodantin, trazodone, Robaxin, topical lidocaine  After her previous treatment she went to the [place] and got COVID on the last day of her visit.   ROS      Objective:    BP 108/75   Pulse 81   Ht 5\' 4"  (1.626 m)   Wt 163 lb (73.9 kg)   SpO2 99%   BMI 27.98 kg/m    Physical Exam General: Alert, oriented Neri: No respiratory distress Psych: Pleasant affect     Assessment & Plan:   Dysuria Assessment & Plan: Patient with continued symptoms after recent treatment with Keflex.  Has a long history of interstitial cystitis.  Urinalysis today showed possible infection but not definitive.  Will send in culture.  Will prescribe Bactrim.  Has several allergies to antibiotics but Bactrim is not listed in them.  Will send this in for 3 days  Orders: -     POCT URINALYSIS DIP (CLINITEK) -     Urine Culture  Other orders -     Sulfamethoxazole-Trimethoprim; Take 1 tablet by mouth 2 (two) times daily for 3 days.  Dispense: 6 tablet; Refill: 0     No follow-ups on file.  Laneta Pintos, MD

## 2023-08-25 NOTE — Progress Notes (Signed)
 POC

## 2023-08-25 NOTE — Telephone Encounter (Signed)
 Refill for Asmanex 220 mcg x 1 with 3 refills sent to [facility].

## 2023-08-27 DIAGNOSIS — R300 Dysuria: Secondary | ICD-10-CM | POA: Insufficient documentation

## 2023-08-27 NOTE — Assessment & Plan Note (Signed)
 Patient with continued symptoms after recent treatment with Keflex.  Has a long history of interstitial cystitis.  Urinalysis today showed possible infection but not definitive.  Will send in culture.  Will prescribe Bactrim.  Has several allergies to antibiotics but Bactrim is not listed in them.  Will send this in for 3 days

## 2023-08-28 ENCOUNTER — Ambulatory Visit: Payer: Self-pay

## 2023-08-28 NOTE — Telephone Encounter (Signed)
 Contacted pharmacy about this and they are just needing the OK from provider, without this they will not fill this Rx.

## 2023-08-28 NOTE — Telephone Encounter (Signed)
 I contacted pt to let her know that you are waiting on the results from the culture to determine which medicine you will send in.  Informed her that is why the medication at the pharmacy has not been filled yet

## 2023-08-28 NOTE — Telephone Encounter (Signed)
 Copied from CRM [id number]. Topic: Clinical - Prescription Issue >> Aug 28, 2023  8:41 AM Leory Rands wrote: Reason for CRM: Adria calling from [facility] is calling about a drug interaction -methotrexate 50 MG/2ML injection [[id number] with sulfamethoxazole-trimethoprim (BACTRIM DS) 800-160 MG tablet [[id number]] Cb- [phone]

## 2023-08-29 ENCOUNTER — Other Ambulatory Visit: Payer: Self-pay | Admitting: Family Medicine

## 2023-08-29 DIAGNOSIS — N3001 Acute cystitis with hematuria: Secondary | ICD-10-CM

## 2023-08-29 LAB — URINE CULTURE

## 2023-08-29 MED ORDER — CEFDINIR 300 MG PO CAPS: 300.0000 mg | ORAL_CAPSULE | Freq: Two times a day (BID) | ORAL | 0 refills | Status: AC

## 2023-08-29 NOTE — Progress Notes (Signed)
 Pt to have test of cure at the completion of her abx

## 2023-09-06 ENCOUNTER — Ambulatory Visit (HOSPITAL_BASED_OUTPATIENT_CLINIC_OR_DEPARTMENT_OTHER): Payer: BC Managed Care – PPO | Admitting: Obstetrics & Gynecology

## 2023-09-06 ENCOUNTER — Encounter (HOSPITAL_BASED_OUTPATIENT_CLINIC_OR_DEPARTMENT_OTHER): Payer: Self-pay | Admitting: Obstetrics & Gynecology

## 2023-09-06 ENCOUNTER — Other Ambulatory Visit (HOSPITAL_COMMUNITY)
Admission: RE | Admit: 2023-09-06 | Discharge: 2023-09-06 | Disposition: A | Discharge status: Home / Self Care | Source: Ambulatory Visit | Attending: Obstetrics & Gynecology | Admitting: Obstetrics & Gynecology

## 2023-09-06 VITALS — BP 94/70 | HR 85 | Ht 64.0 in | Wt 166.6 lb

## 2023-09-06 DIAGNOSIS — N898 Other specified noninflammatory disorders of vagina: Secondary | ICD-10-CM | POA: Insufficient documentation

## 2023-09-06 DIAGNOSIS — Z780 Asymptomatic menopausal state: Secondary | ICD-10-CM

## 2023-09-06 DIAGNOSIS — N3090 Cystitis, unspecified without hematuria: Secondary | ICD-10-CM

## 2023-09-06 DIAGNOSIS — Z113 Encounter for screening for infections with a predominantly sexual mode of transmission: Secondary | ICD-10-CM | POA: Insufficient documentation

## 2023-09-06 DIAGNOSIS — Z79890 Hormone replacement therapy (postmenopausal): Secondary | ICD-10-CM

## 2023-09-06 DIAGNOSIS — Z01419 Encounter for gynecological examination (general) (routine) without abnormal findings: Secondary | ICD-10-CM

## 2023-09-06 MED ORDER — ESTRADIOL 0.0375 MG/24HR TD PTTW
1.0000 | MEDICATED_PATCH | TRANSDERMAL | 3 refills | Status: AC
Start: 2023-09-07 — End: ?

## 2023-09-06 MED ORDER — PROGESTERONE MICRONIZED 100 MG PO CAPS: 100.0000 mg | ORAL_CAPSULE | Freq: Every day | ORAL | 3 refills | Status: AC

## 2023-09-06 MED ORDER — FLUCONAZOLE 150 MG PO TABS: ORAL_TABLET | ORAL | 0 refills | Status: AC

## 2023-09-06 NOTE — Progress Notes (Signed)
 ANNUAL EXAM Patient name: Erika Mccoy MRN [id number]  Date of birth: [birth year] Chief Complaint:   Annual Exam  History of Present Illness:   Erika Mccoy is a 58 y.o. G34P2003 Caucasian female being seen today for a routine annual exam.  Currently on antibiotic for UTI but still doesn't feel like it is all resolved.  Reports having gross hematuria over the past few months.  Urine culture last week showed klebsiella.  Due to hematuria, she is planning to urologist at [facility].    Denies vaginal bleeding.  Is having some issues with a hemorrhoid right now.  She is having some external itching.  Not sure if she has yeast.    Her kids are doing really well.  Her twin sons are living in [place].  Daughter is here in [place] and married.     No LMP recorded. Patient has had an ablation.   Last pap 05/31/2022. Results were: NILM w/ HRHPV negative.  Last mammogram: 11/08/2022. Results were: normal. Family h/o breast cancer: no Last colonoscopy: 03/06/2017. Results were: normal. Family h/o colorectal cancer: no     07/18/2023    3:34 PM 04/17/2023    2:35 PM 10/21/2022   11:05 AM 06/23/2022    9:49 AM 04/11/2022    3:14 PM  Depression screen PHQ 2/9  Decreased Interest 3 2 0 0 3  Down, Depressed, Hopeless 3 2 0 0 3  PHQ - 2 Score 6 4 0 0 6  Altered sleeping 3 2   3   Tired, decreased energy 3 2   3   Change in appetite 2 2   3   Feeling bad or failure about yourself  3 2   3   Trouble concentrating 2 2   3   Moving slowly or fidgety/restless 1 2   3   Suicidal thoughts 0 0   3  PHQ-9 Score 20 16   27   Difficult doing work/chores Very difficult Very difficult   Extremely dIfficult        07/18/2023    3:35 PM 04/17/2023    2:35 PM 10/28/2021    1:33 PM  GAD 7 : Generalized Anxiety Score  Nervous, Anxious, on Edge 3 3   Control/stop worrying 2 2   Worry too much - different things 2 2   Trouble relaxing 2 1   Restless 1 1   Easily annoyed or irritable 1 1   Afraid - awful might  happen 2 1   Total GAD 7 Score 13 11   Anxiety Difficulty Very difficult Extremely difficult      Information is confidential and restricted. Go to Review Flowsheets to unlock data.     Review of Systems:   Pertinent items are noted in HPI  Denies any vaginal bleeding, vaginal bleeding, or breast changes.    Pertinent History Reviewed:  Reviewed past medical,surgical, social and family history.  Reviewed problem list, medications and allergies. Physical Assessment:   Vitals:   09/06/23 1051  BP: 94/70  Pulse: 85  Weight: 166 lb 9.6 oz (75.6 kg)  Height: 5\' 4"  (1.626 m)  Body mass index is 28.6 kg/m.        Physical Examination:   General appearance - well appearing, and in no distress  Mental status - alert, oriented to person, place, and time  Psych:  She has a normal mood and affect  Skin - warm and dry, normal color, no suspicious lesions noted  Chest - effort normal, all lung fields clear to  auscultation bilaterally  Heart - normal rate and regular rhythm  Neck:  midline trachea, no thyromegaly or nodules  Breasts - breasts appear normal, no suspicious masses, no skin or nipple changes or  axillary nodes  Abdomen - soft, nontender, nondistended, no masses or organomegaly  Pelvic - VULVA: normal appearing vulva with no masses, tenderness or lesions  VAGINA: normal appearing vagina with normal color and discharge, no lesions  CERVIX: normal appearing cervix without discharge or lesions, no CMT  Thin prep pap is not done  UTERUS: uterus is felt to be normal size, shape, consistency and nontender   ADNEXA: No adnexal masses or tenderness noted.  Rectal - normal rectal, good sphincter tone, no masses felt.   Extremities:  No swelling or varicosities noted  Chaperone present for exam  Assessment & Plan:  1. Well woman exam with routine gynecological exam (Primary) - Pap smear neg with neg HR HPV 05/2022.  Repeat  - Mammogram 11/08/2022 - Colonoscopy 03/06/2017, follow up  10 years - Bone mineral density guidelines reviewed - lab work done with PCP, Maryclare Smoke - vaccines reviewed/updated  2. Cystitis - recently treated but still with some symptoms so will check urine culture - Urine Culture  3. Hormone replacement therapy (HRT) - doing well with current HRT dosage - progesterone  (PROMETRIUM ) 100 MG capsule; Take 1 capsule (100 mg total) by mouth daily.  Dispense: 90 capsule; Refill: 3 - estradiol  (VIVELLE -DOT) 0.0375 MG/24HR; Place 1 patch onto the skin 2 (two) times a week.  Dispense: 24 patch; Refill: 3  4. Postmenopausal  5. Vaginal itching - Cervicovaginal ancillary only( [place]) - fluconazole  (DIFLUCAN ) 150 MG tablet; Take 1 tablet orally every 72 hours for 3 doses.  Dispense: 1 tablet; Refill: 0    Meds:  Meds ordered this encounter  Medications   progesterone  (PROMETRIUM ) 100 MG capsule    Sig: Take 1 capsule (100 mg total) by mouth daily.    Dispense:  90 capsule    Refill:  3    Needs annual exam scheduled for further refills.   estradiol  (VIVELLE -DOT) 0.0375 MG/24HR    Sig: Place 1 patch onto the skin 2 (two) times a week.    Dispense:  24 patch    Refill:  3   fluconazole  (DIFLUCAN ) 150 MG tablet    Sig: Take 1 tablet orally every 72 hours for 3 doses.    Dispense:  1 tablet    Refill:  0    Follow-up: Return in about 1 year (around 09/05/2024).  Lillian Rein, MD 09/09/2023 7:05 PM

## 2023-09-07 LAB — CERVICOVAGINAL ANCILLARY ONLY
Bacterial Vaginitis (gardnerella): NEGATIVE
Candida Glabrata: NEGATIVE
Candida Vaginitis: POSITIVE — AB
Comment: NEGATIVE
Comment: NEGATIVE
Comment: NEGATIVE

## 2023-09-07 LAB — URINE CULTURE: Organism ID, Bacteria: NO GROWTH

## 2023-09-09 ENCOUNTER — Encounter (HOSPITAL_BASED_OUTPATIENT_CLINIC_OR_DEPARTMENT_OTHER): Payer: Self-pay | Admitting: Obstetrics & Gynecology

## 2023-09-12 ENCOUNTER — Other Ambulatory Visit: Payer: Self-pay | Admitting: Physician Assistant

## 2023-09-12 ENCOUNTER — Ambulatory Visit (INDEPENDENT_AMBULATORY_CARE_PROVIDER_SITE_OTHER): Payer: BC Managed Care – PPO | Admitting: Allergy and Immunology

## 2023-09-12 VITALS — BP 130/70 | HR 83 | Temp 98.0°F | Resp 18

## 2023-09-12 DIAGNOSIS — Z888 Allergy status to other drugs, medicaments and biological substances status: Secondary | ICD-10-CM

## 2023-09-12 DIAGNOSIS — T485X5A Adverse effect of other anti-common-cold drugs, initial encounter: Secondary | ICD-10-CM

## 2023-09-12 DIAGNOSIS — J3089 Other allergic rhinitis: Secondary | ICD-10-CM

## 2023-09-12 DIAGNOSIS — J4540 Moderate persistent asthma, uncomplicated: Secondary | ICD-10-CM

## 2023-09-12 DIAGNOSIS — K219 Gastro-esophageal reflux disease without esophagitis: Secondary | ICD-10-CM

## 2023-09-12 MED ORDER — ASMANEX (60 METERED DOSES) 220 MCG/ACT IN AEPB: 1.0000 | INHALATION_SPRAY | Freq: Two times a day (BID) | RESPIRATORY_TRACT | 3 refills | Status: AC

## 2023-09-12 MED ORDER — AIRSUPRA 90-80 MCG/ACT IN AERO: 2.0000 | INHALATION_SPRAY | RESPIRATORY_TRACT | 2 refills | Status: AC | PRN

## 2023-09-12 MED ORDER — RYALTRIS 665-25 MCG/ACT NA SUSP: 2.0000 | Freq: Two times a day (BID) | NASAL | 0 refills | Status: AC

## 2023-09-12 NOTE — Patient Instructions (Addendum)
  1. Treat inflammation of airway:   A. Ryaltris  - 2 sprays + Afrin - 1 spray each nostril 2 times per day  B. Asmanex  200 Twisthaler - 1 inhalation 2 times per day  2. Treat reflux induced inflammation of airway:   A. Protonix  40 mg - 1 tablet 1 time per day  B. Famotidine  40 mg - 1 tablet in evening IF NEEDED  C. Slowly decrease caffeine and chocolate consumption  D. Replace throat clearing with swallowing/drinking maneuver  3. If needed:   A. AIRSUPRA - 2 inhalations every 6 hours (Coupon)  B. Mucinex DM - 1 tablet 2 times per day  C. Loratadine  10 mg - 1 tablet 1 time per day  4. Influenza = Tamiflu. Covid = Paxlovid  5. Return to clinic in 12 weeks or earlier if problem.
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
## 2023-09-13 ENCOUNTER — Encounter: Payer: Self-pay | Admitting: Allergy and Immunology
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
## 2023-09-19 ENCOUNTER — Other Ambulatory Visit: Payer: Self-pay | Admitting: Physician Assistant

## 2023-09-19 ENCOUNTER — Other Ambulatory Visit: Payer: Self-pay | Admitting: Allergy and Immunology

## 2023-09-20 ENCOUNTER — Telehealth: Payer: Self-pay | Admitting: Physician Assistant

## 2023-09-20 ENCOUNTER — Other Ambulatory Visit: Payer: Self-pay

## 2023-09-20 MED ORDER — CLONAZEPAM 1 MG PO TABS: ORAL_TABLET | ORAL | 0 refills | Status: DC

## 2023-09-20 NOTE — Telephone Encounter (Signed)
 Patient called in for refill on Clonazepam  1mg . Ph: [phone] Appt 6/10 Pharmacy [facility] [address]

## 2023-09-20 NOTE — Telephone Encounter (Signed)
 Per patient request a RF for clonazepam  was sent to pharmacy in [place], [place]. Pharmacy is concerned because she is out of area. The want to know why she is on opioids and benzos. I told her we didn't prescribe the Percocet.

## 2023-09-20 NOTE — Telephone Encounter (Signed)
 Pended 30-day supply of clonazepam  to [facility]

## 2023-09-20 NOTE — Telephone Encounter (Signed)
 Pharmacy LVM @ 4:42p that they need clarification on pt's medication.  Next appt 6/10  Pharmacy [phone]

## 2023-09-21 NOTE — Telephone Encounter (Signed)
 I have no answer except she needs them both.  If they're not comfortable filling a months supply of Klonopin , have them fill 7 days, or however many days she needs before she gets back home. Then we can send a Rx here to get her to the next appt.

## 2023-09-21 NOTE — Telephone Encounter (Signed)
 Called patient. She was at the pharmacy. Called and spoke to the pharmacist and they were concerned about all the controlled medication she is taking. After giving dx and date of LV, pharmacist was agreeable to fill the whole Rx. Called patient and she was still at the pharmacy.  The pharmacist had tried to reach the provider who prescribes Norco without response. Pt said she had been unable to reach provider either. Told her to let us  know if there was still an issue.

## 2023-09-25 ENCOUNTER — Other Ambulatory Visit: Payer: Self-pay | Admitting: Physician Assistant

## 2023-09-25 NOTE — Telephone Encounter (Signed)
 LF 5/8 for 7 day supply, was out of town.

## 2023-09-26 ENCOUNTER — Ambulatory Visit: Admitting: Internal Medicine

## 2023-09-26 DIAGNOSIS — M7061 Trochanteric bursitis, right hip: Secondary | ICD-10-CM

## 2023-09-26 DIAGNOSIS — R809 Proteinuria, unspecified: Secondary | ICD-10-CM

## 2023-09-26 DIAGNOSIS — M1380 Other specified arthritis, unspecified site: Secondary | ICD-10-CM

## 2023-09-26 DIAGNOSIS — K219 Gastro-esophageal reflux disease without esophagitis: Secondary | ICD-10-CM

## 2023-09-26 DIAGNOSIS — Z79899 Other long term (current) drug therapy: Secondary | ICD-10-CM

## 2023-09-26 DIAGNOSIS — N3010 Interstitial cystitis (chronic) without hematuria: Secondary | ICD-10-CM

## 2023-09-26 DIAGNOSIS — L900 Lichen sclerosus et atrophicus: Secondary | ICD-10-CM

## 2023-09-27 ENCOUNTER — Other Ambulatory Visit: Payer: Self-pay

## 2023-10-10 ENCOUNTER — Ambulatory Visit (INDEPENDENT_AMBULATORY_CARE_PROVIDER_SITE_OTHER): Admitting: Psychiatry

## 2023-10-10 DIAGNOSIS — F411 Generalized anxiety disorder: Secondary | ICD-10-CM | POA: Diagnosis not present

## 2023-10-10 DIAGNOSIS — Z8659 Personal history of other mental and behavioral disorders: Secondary | ICD-10-CM

## 2023-10-10 DIAGNOSIS — R69 Illness, unspecified: Secondary | ICD-10-CM | POA: Diagnosis not present

## 2023-10-10 DIAGNOSIS — Z636 Dependent relative needing care at home: Secondary | ICD-10-CM

## 2023-10-10 DIAGNOSIS — F319 Bipolar disorder, unspecified: Secondary | ICD-10-CM
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
## 2023-10-24 ENCOUNTER — Ambulatory Visit: Admitting: Physician Assistant

## 2023-10-24 ENCOUNTER — Ambulatory Visit: Admitting: Internal Medicine

## 2023-10-24 ENCOUNTER — Encounter: Payer: Self-pay | Admitting: Physician Assistant

## 2023-10-24 DIAGNOSIS — F4310 Post-traumatic stress disorder, unspecified: Secondary | ICD-10-CM

## 2023-10-24 DIAGNOSIS — F5105 Insomnia due to other mental disorder: Secondary | ICD-10-CM

## 2023-10-24 DIAGNOSIS — M1380 Other specified arthritis, unspecified site: Secondary | ICD-10-CM

## 2023-10-24 DIAGNOSIS — F411 Generalized anxiety disorder: Secondary | ICD-10-CM | POA: Diagnosis not present

## 2023-10-24 DIAGNOSIS — L900 Lichen sclerosus et atrophicus: Secondary | ICD-10-CM

## 2023-10-24 DIAGNOSIS — K219 Gastro-esophageal reflux disease without esophagitis: Secondary | ICD-10-CM

## 2023-10-24 DIAGNOSIS — N3010 Interstitial cystitis (chronic) without hematuria: Secondary | ICD-10-CM

## 2023-10-24 DIAGNOSIS — M7062 Trochanteric bursitis, left hip: Secondary | ICD-10-CM

## 2023-10-24 DIAGNOSIS — F319 Bipolar disorder, unspecified: Secondary | ICD-10-CM

## 2023-10-24 DIAGNOSIS — Z79899 Other long term (current) drug therapy: Secondary | ICD-10-CM

## 2023-10-24 DIAGNOSIS — G2581 Restless legs syndrome: Secondary | ICD-10-CM

## 2023-10-24 DIAGNOSIS — F99 Mental disorder, not otherwise specified: Secondary | ICD-10-CM

## 2023-10-24 DIAGNOSIS — Z636 Dependent relative needing care at home: Secondary | ICD-10-CM

## 2023-10-24 MED ORDER — TRAZODONE HCL 100 MG PO TABS: 100.0000 mg | ORAL_TABLET | Freq: Every day | ORAL | 1 refills | Status: DC

## 2023-10-24 MED ORDER — CLONAZEPAM 1 MG PO TABS: 1.0000 mg | ORAL_TABLET | Freq: Three times a day (TID) | ORAL | 5 refills | Status: DC | PRN

## 2023-10-24 MED ORDER — AUVELITY 45-105 MG PO TBCR: 1.0000 | EXTENDED_RELEASE_TABLET | Freq: Two times a day (BID) | ORAL | 1 refills | Status: DC

## 2023-10-24 NOTE — Progress Notes (Signed)
 [facility]  Patient ID: Erika Mccoy,  MRN: [id number]  PCP: Noreene Bearded, PA  Date of Evaluation: 10/24/2023 Time spent:25 minutes  Chief Complaint:  Chief Complaint   Anxiety; Depression; Insomnia; Follow-up    Virtual Visit via Telehealth  I connected with patient by telephone, with their informed consent, and verified patient privacy and that I am speaking with the correct person using two identifiers.  I am private, in my office and the patient is at home.  I discussed the limitations, risks, security and privacy concerns of performing an evaluation and management service by telephone and the availability of in person appointments. I also discussed with the patient that there may be a patient responsible charge related to this service. The patient expressed understanding and agreed to proceed.   I discussed the assessment and treatment plan with the patient. The patient was provided an opportunity to ask questions and all were answered. The patient agreed with the plan and demonstrated an understanding of the instructions.   The patient was advised to call back or seek an in-person evaluation if the symptoms worsen or if the condition fails to improve as anticipated.  I provided 25  minutes of non-face-to-face time during this encounter.  HISTORY/CURRENT STATUS: HPI  for routine med check.  Doing well except for concern about her husband's upcoming BMT. Has a form of 'blood cancer.' States that inside she feels like 'mush' some days b/c she's afraid, but she tries to be very strong for him. She has a good support system through family and friends. Klonopin  helps the anxiety when needed.   Patient is able to enjoy things.  Energy and motivation are good.  The Auvelity  is really helpful.  No extreme sadness, tearfulness, or feelings of hopelessness.  Sleeps well most of the time. ADLs and personal hygiene are normal.   Denies any changes in concentration, making  decisions, or remembering things.  Appetite has not changed.  Weight is stable.  Denies suicidal or homicidal thoughts.  Patient denies increased energy with decreased need for sleep, increased talkativeness, racing thoughts, impulsivity or risky behaviors, increased spending, increased libido, grandiosity, increased irritability or anger, paranoia, or hallucinations.  Denies dizziness, syncope, seizures, tingling, tremor, tics, unsteady gait, slurred speech, confusion. Has chronic diffuse muscle pain, also lumbar pain with rad to legs, seeing pain management and ortho. Continues to f/u w/ GYN, Pulmonology, Cardiology.  Individual Medical History/ Review of Systems: Changes? :No     Past Psychiatric History:    She voluntarily committed herself in 2004 and 3 times since then. For depression. Never attempted suicide.    Past medications for mental health diagnoses include: Prozac didn't work, Effexor , Wellbutrin , Seroquel, Vraylar , Paxil, Zoloft, Cymbalta , Pristiq, Klonopin , Xanax , Trazodone , Ambien , Lunesta, Spravato started 10/2021, insurance will not pay for it, Lamictal  didn't help but took it for a long time, Adderall, Lyrica  caused crying   Never had ECT, TMS  Allergies: Ibuprofen , Neomycin-bacitracin -polymyxin  [bacitracin -neomycin-polymyxin], Quinolones, Atorvastatin , Bacitracin -polymyxin b, Bactroban  [mupirocin ], Benzalkonium chloride, Cefprozil, Cephalexin , Ciprofloxacin, Crestor  [rosuvastatin ], Mederma, Monistat [miconazole], Neomycin-bacitracin  zn-polymyx, Polyoxyethylene 40 sorbitol  septaoleate [sorbitan], Quinine, Tioconazole, Valtrex  [valacyclovir  hcl], and Adhesive [tape]  Current Medications:  Current Outpatient Medications:    Albuterol -Budesonide (AIRSUPRA ) 90-80 MCG/ACT AERO, Inhale 2 puffs into the lungs every 4 (four) hours as needed., Disp: 10.7 g, Rfl: 2   ASPIRIN 81 PO, Take by mouth., Disp: , Rfl:    cariprazine  (VRAYLAR ) 3 MG capsule, Take 1 capsule by mouth once  daily, Disp:  30 capsule, Rfl: 1   Cholecalciferol  100 MCG (4000 UT) CAPS, Take 1 capsule by mouth daily., Disp: , Rfl:    clobetasol  ointment (TEMOVATE ) 0.05 %, APPLY SMALL AMOUNT TOPICALLY TO THE AFFECTED AREA 2 TIMES A WEEK, Disp: 60 g, Rfl: 0   ELMIRON  100 MG capsule, Take 100 mg by mouth in the morning and at bedtime. Takes per bladder, Disp: , Rfl:    estradiol  (VIVELLE -DOT) 0.0375 MG/24HR, Place 1 patch onto the skin 2 (two) times a week., Disp: 24 patch, Rfl: 3   Evolocumab  (REPATHA  SURECLICK) 140 MG/ML SOAJ, Inject 140 mg into the skin every 14 (fourteen) days., Disp: 2 mL, Rfl: 11   famotidine  (PEPCID ) 40 MG tablet, Take 1 tablet (40 mg total) by mouth at bedtime., Disp: 30 tablet, Rfl: 0   fluticasone -salmeterol (ADVAIR HFA) 115-21 MCG/ACT inhaler, INHALE 2 PUFFS INTO THE LUNGS TWICE DAILY, Disp: 12 g, Rfl: 2   folic acid  (FOLVITE ) 1 MG tablet, Take 1 tablet (1 mg total) by mouth daily., Disp: 90 tablet, Rfl: 0   HYDROcodone -acetaminophen  (NORCO) 10-325 MG tablet, Take 1 tablet 3 times a day by oral route as needed for pain for 30 days., Disp: , Rfl:    hydrOXYzine  (ATARAX /VISTARIL ) 25 MG tablet, Take 50 mg by mouth at bedtime. , Disp: , Rfl:    levalbuterol  (XOPENEX  HFA) 45 MCG/ACT inhaler, Inhale 2 puffs into the lungs every 6 (six) hours as needed for wheezing., Disp: 15 g, Rfl: 0   loratadine  (CLARITIN ) 10 MG tablet, Take 1 tablet (10 mg total) by mouth daily., Disp: 30 tablet, Rfl: 0   methotrexate  50 MG/2ML injection, , Disp: , Rfl:    mometasone  (ASMANEX , 60 METERED DOSES,) 220 MCG/ACT inhaler, Inhale 1 puff into the lungs 2 (two) times daily., Disp: 1 each, Rfl: 3   Multiple Vitamins-Minerals (MULTIVITAMIN WITH MINERALS) tablet, Take 1 tablet by mouth daily., Disp: , Rfl:    Olopatadine-Mometasone  (RYALTRIS ) 665-25 MCG/ACT SUSP, Place 2 sprays into both nostrils in the morning and at bedtime., Disp: 29 g, Rfl: 0   pantoprazole  (PROTONIX ) 40 MG tablet, Take 1 tablet by mouth twice  daily, Disp: 60 tablet, Rfl: 2   progesterone  (PROMETRIUM ) 100 MG capsule, Take 1 capsule (100 mg total) by mouth daily., Disp: 90 capsule, Rfl: 3   VITAMIN D  PO, Take by mouth., Disp: , Rfl:    clonazePAM  (KLONOPIN ) 1 MG tablet, Take 1 tablet (1 mg total) by mouth 3 (three) times daily as needed. for anxiety, Disp: 90 tablet, Rfl: 5   Dextromethorphan -buPROPion  ER (AUVELITY ) 45-105 MG TBCR, Take 1 tablet by mouth 2 (two) times daily., Disp: 180 tablet, Rfl: 1   fluconazole  (DIFLUCAN ) 150 MG tablet, Take 1 tablet orally every 72 hours for 3 doses., Disp: 1 tablet, Rfl: 0   methotrexate  50 MG/2ML injection, INJECT 12.5 MG (0.5 ML) INTO THE SKIN  ONCE A WEEK, Disp: 4 mL, Rfl: 0   traZODone  (DESYREL ) 100 MG tablet, Take 1 tablet (100 mg total) by mouth at bedtime., Disp: 90 tablet, Rfl: 1   TUBERCULIN SYR 1CC/26GX3/8" 26G X 3/8" 1 ML MISC, Use once weekly for methotrexate  injection, Disp: 25 each, Rfl: 0 Medication Side Effects: sexual dysfunction  Family Medical/ Social History: Changes?  No  MENTAL HEALTH EXAM:  There were no vitals taken for this visit.There is no height or weight on file to calculate BMI.  General Appearance: unable to assess  Eye Contact:  unable to assess  Speech:  Clear and Coherent  and Normal Rate  Volume:  Normal  Mood:  Euthymic  Affect:  unable to assess  Thought Process:  Goal Directed and Descriptions of Associations: Circumstantial  Orientation:  Full (Time, Place, and Person)  Thought Content: Logical   Suicidal Thoughts:  No  Homicidal Thoughts:  No  Memory:  WNL  Judgement:  Good  Insight:  Good  Psychomotor Activity: unable to assess  Concentration:  Concentration: Good and Attention Span: Good  Recall:  Good  Fund of Knowledge: Good  Language: Good  Assets:  Communication Skills Desire for Improvement Financial Resources/Insurance Housing Resilience Transportation  ADL's:  Intact  Cognition: WNL  Prognosis:  Good   Cardiology and PCP  follows labs.   DIAGNOSES:    ICD-10-CM   1. Bipolar I disorder (HCC)  F31.9     2. Generalized anxiety disorder (hx longterm PTSD)  F41.1     3. Insomnia due to other mental disorder  F51.05    F99     4. PTSD (post-traumatic stress disorder) - multiple traumas, stable  F43.10     5. Caregiver stress  Z63.6     6. Restless leg syndrome  G25.81       Receiving Psychotherapy: Yes   with Dr. [name]  RECOMMENDATIONS:  PDMP reviewed.  Klonopin  filled 09/27/2023.    Also on hydrocodone , known to me. I provided 25 minutes of non-face-to-face time during this encounter, including time spent before and after the visit in records review, medical decision making, counseling pertinent to today's visit, and charting.   Best wishes on [name]'s upcoming BMT.  She's doing well on current treatment so no changes are needed.   Continue Vraylar  3 mg daily.  Continue Klonopin  1 mg, 1 po tid prn.   Continue Auvelity  45-105 mg bid. Cont Hydroxyzine  25 mg, 2 po qhs.  Continue Trazodone  100 mg, 1/2-1 qhs prn.  Continue therapy with Dr. [name]. Return in 6 months.    Marvia Slocumb, PA-C

## 2023-10-27 ENCOUNTER — Other Ambulatory Visit: Payer: Self-pay | Admitting: Internal Medicine

## 2023-10-27 DIAGNOSIS — M1380 Other specified arthritis, unspecified site: Secondary | ICD-10-CM

## 2023-10-27 NOTE — Telephone Encounter (Signed)
 Last Fill: 05/31/2023  Next Visit: 11/28/2023  Last Visit: 07/25/2023  Dx: Seronegative inflammatory arthritis   Current Dose per office note on 07/25/2023: folic acid  1 mg daily   Okay to refill Folic Acid ?

## 2023-10-30 ENCOUNTER — Other Ambulatory Visit: Payer: Self-pay | Admitting: Internal Medicine

## 2023-10-30 DIAGNOSIS — M1380 Other specified arthritis, unspecified site: Secondary | ICD-10-CM

## 2023-10-30 DIAGNOSIS — Z79899 Other long term (current) drug therapy: Secondary | ICD-10-CM

## 2023-10-30 NOTE — Telephone Encounter (Signed)
 Last Fill: 08/22/2023  Labs: 07/25/2023  Next Visit: 11/28/2023  Last Visit: 07/25/2023  DX: Seronegative inflammatory arthritis   Current Dose per office note 07/25/2023: methotrexate  12.5 mg [place] weekly   Contacted the patient and advised she is due for labs. Patient states she will stop by the office tomorrow to get labs done.   Okay to refill Methotrexate ?

## 2023-11-02 ENCOUNTER — Ambulatory Visit (INDEPENDENT_AMBULATORY_CARE_PROVIDER_SITE_OTHER)

## 2023-11-02 VITALS — BP 103/70 | HR 73 | Ht 64.0 in | Wt 176.1 lb

## 2023-11-02 DIAGNOSIS — F319 Bipolar disorder, unspecified: Secondary | ICD-10-CM

## 2023-11-02 DIAGNOSIS — Z6830 Body mass index (BMI) 30.0-30.9, adult: Secondary | ICD-10-CM | POA: Diagnosis not present

## 2023-11-02 DIAGNOSIS — F329 Major depressive disorder, single episode, unspecified: Secondary | ICD-10-CM

## 2023-11-02 MED ORDER — SEMAGLUTIDE-WEIGHT MANAGEMENT 1.7 MG/0.75ML ~~LOC~~ SOAJ: 1.7000 mg | SUBCUTANEOUS | 0 refills | Status: AC

## 2023-11-02 MED ORDER — SEMAGLUTIDE-WEIGHT MANAGEMENT 0.5 MG/0.5ML ~~LOC~~ SOAJ
0.5000 mg | SUBCUTANEOUS | 0 refills | Status: AC
Start: 2023-12-01 — End: 2023-12-29

## 2023-11-02 MED ORDER — SEMAGLUTIDE-WEIGHT MANAGEMENT 1 MG/0.5ML ~~LOC~~ SOAJ: 1.0000 mg | SUBCUTANEOUS | 0 refills | Status: AC

## 2023-11-02 MED ORDER — SEMAGLUTIDE-WEIGHT MANAGEMENT 0.25 MG/0.5ML ~~LOC~~ SOAJ
0.2500 mg | SUBCUTANEOUS | 0 refills | Status: AC
Start: 2023-11-02 — End: 2023-11-30

## 2023-11-02 MED ORDER — SEMAGLUTIDE-WEIGHT MANAGEMENT 2.4 MG/0.75ML ~~LOC~~ SOAJ
2.4000 mg | SUBCUTANEOUS | 0 refills | Status: AC
Start: 2024-02-26 — End: 2024-03-25

## 2023-11-02 NOTE — Progress Notes (Signed)
 Established Patient Office Visit  Subjective   Patient ID: Erika Mccoy, female    DOB: 01/22/1966  Age: 58 y.o. MRN: [id number]  Chief Complaint  Patient presents with   Medical Management of Chronic Issues    HPI  Tatjana Turcott is a 58 y.o. y/o female who presents to the clinic today for follow up on mood.  Patient reports that she is currently following with psychiatry and has been taking a new medication called Auvelity  (buproprion-dextromethorphan ) 150 mg AM and PM for mood. Reports that she is tolerating this medication well and her mood is the best it has been in a while. Uses klonopin  1 mg up to TID PRN for anxiety which is also prescribed by psychiatry. She also follows with a therapist that she has seen for many years. Denies SI/HI.  Patient would also like to discuss options for weight loss today. She reports that she has tried multiple fad diets and loses weight, but then gains it back after she stops the diet. Due to her RA, she is not able to exercise regularly due to pain. She is walking every day. Has never taken anything for weight loss in the past.     ROS Per HPI.    Objective:     BP 103/70   Pulse 73   Ht 5' 4 (1.626 m)   Wt 176 lb 1.9 oz (79.9 kg)   SpO2 99%   BMI 30.23 kg/m    Physical Exam Constitutional:      General: She is not in acute distress.    Appearance: Normal appearance.   Cardiovascular:     Rate and Rhythm: Normal rate and regular rhythm.     Heart sounds: Normal heart sounds. No murmur heard.    No friction rub. No gallop.  Pulmonary:     Effort: Pulmonary effort is normal. No respiratory distress.     Breath sounds: Normal breath sounds.   Musculoskeletal:        General: No swelling.   Skin:    General: Skin is warm and dry.   Neurological:     General: No focal deficit present.     Mental Status: She is alert.   Psychiatric:        Mood and Affect: Mood normal.        Behavior: Behavior normal.        Thought  Content: Thought content normal.    No results found for any visits on 11/02/23.    The ASCVD Risk score (Arnett DK, et al., 2019) failed to calculate for the following reasons:   The valid total cholesterol range is 130 to 320 mg/dL    Assessment & Plan:   BMI 30.0-30.9,adult Assessment & Plan: Discussed with patient healthy ways to lose weight including tracking calories with apps such as  MyFitnessPal and eating in a calorie deficit to sustain weight loss of 1 to 2 pounds per week.  Advised patient to continue daily walking as tolerated.  Also encouraged her to increase her water intake to at least 80 ounces per day.  Discussed reluctance to prescribe phentermine due to concurrent dextromethorphan  use with her Auvelity .  Also discussed reluctance to try other medications for weight loss such as Wellbutrin , Topamax  due to the fact that patient has already been on these medications previously for mood and reports she did not lose any weight while taking them.  Also reluctant to try these medications given that she is following with psychiatry closely  for management of treatment resistant depression and bipolar.  Have sent in Wegovy 0.25 mg weekly for weight loss.  Advised patient that if insurance will not cover this medication after PA, we can discuss other options for how to get this medication cheaper.  Will plan to follow-up on weight in 4 months.  Orders: -     Semaglutide-Weight Management; Inject 0.25 mg into the skin once a week for 28 days.  Dispense: 2 mL; Refill: 0 -     Semaglutide-Weight Management; Inject 0.5 mg into the skin once a week for 28 days.  Dispense: 2 mL; Refill: 0 -     Semaglutide-Weight Management; Inject 1 mg into the skin once a week for 28 days.  Dispense: 2 mL; Refill: 0 -     Semaglutide-Weight Management; Inject 1.7 mg into the skin once a week for 28 days.  Dispense: 2 mL; Refill: 0 -     Semaglutide-Weight Management; Inject 2.4 mg into the skin once a week  for 28 days.  Dispense: 3 mL; Refill: 0  Bipolar 1 disorder (HCC) Assessment & Plan: Patient currently following with Marvia Slocumb, PA with psychiatry.  Recently had a visit with them.  Mood is improved and overall stable on her current medications. Continue Klonopin  1 mg, 1 po tid prn.  Continue Auvelity  45-105 mg bid. Cont Hydroxyzine  25 mg, 2 po qhs.  Continue Trazodone  100 mg, 1/2-1 qhs prn. Continue therapy with Dr. [name].  All as prescribed by psychiatry.  Will continue to coordinate care with psychiatry.   Treatment-resistant depression   Return in about 4 months (around 03/03/2024) for Weight.    Odilia Bennett, PA-C

## 2023-11-02 NOTE — Patient Instructions (Addendum)
 It was nice to see you today!  As we discussed in clinic:  -Continue your medications as prescribed by behavioral health! I am glad that you are feeling well and liking your current medication regimen. -For weight loss: I recommend downloading the app MyFitnessPal. It is not necessary to pay for the subscription after you download it - you can use the free version. -After you input your information and measurements, it will calculate a total number of calories that you need to stay under for the day to sustain weight loss.  -You can use this app to track your daily food intake  -I also recommend aiming for 80 oz or more of water per day.  -I have sent in Wegovy 0.25 mg for weight loss. If insurance does not cover it, we can talk about other ways to get this medication cheaper.  -I will plan on following up with you in 4 months to assess weight loss!  It was nice to meet you!   If you have any problems before your next visit feel free to message me via MyChart (minor issues or questions) or call the office, otherwise you may reach out to schedule an office visit.  Thank you! Meryl Acosta, PA-C

## 2023-11-02 NOTE — Assessment & Plan Note (Signed)
 Discussed with patient healthy ways to lose weight including tracking calories with apps such as  MyFitnessPal and eating in a calorie deficit to sustain weight loss of 1 to 2 pounds per week.  Advised patient to continue daily walking as tolerated.  Also encouraged her to increase her water intake to at least 80 ounces per day.  Discussed reluctance to prescribe phentermine due to concurrent dextromethorphan  use with her Auvelity .  Also discussed reluctance to try other medications for weight loss such as Wellbutrin , Topamax  due to the fact that patient has already been on these medications previously for mood and reports she did not lose any weight while taking them.  Also reluctant to try these medications given that she is following with psychiatry closely for management of treatment resistant depression and bipolar.  Have sent in Wegovy 0.25 mg weekly for weight loss.  Advised patient that if insurance will not cover this medication after PA, we can discuss other options for how to get this medication cheaper.  Will plan to follow-up on weight in 4 months.

## 2023-11-02 NOTE — Assessment & Plan Note (Signed)
 Patient currently following with Marvia Slocumb, PA with psychiatry.  Recently had a visit with them.  Mood is improved and overall stable on her current medications. Continue Klonopin  1 mg, 1 po tid prn.  Continue Auvelity  45-105 mg bid. Cont Hydroxyzine  25 mg, 2 po qhs.  Continue Trazodone  100 mg, 1/2-1 qhs prn. Continue therapy with Dr. [name].  All as prescribed by psychiatry.  Will continue to coordinate care with psychiatry.

## 2023-11-03 ENCOUNTER — Other Ambulatory Visit: Payer: Self-pay | Admitting: *Deleted

## 2023-11-03 DIAGNOSIS — Z79899 Other long term (current) drug therapy: Secondary | ICD-10-CM

## 2023-11-04 LAB — CBC WITH DIFFERENTIAL/PLATELET
Absolute Lymphocytes: 2354 {cells}/uL (ref 850–3900)
Absolute Monocytes: 511 {cells}/uL (ref 200–950)
Basophils Absolute: 58 {cells}/uL (ref 0–200)
Basophils Relative: 0.8 %
Eosinophils Absolute: 94 {cells}/uL (ref 15–500)
Eosinophils Relative: 1.3 %
HCT: 39.7 % (ref 35.0–45.0)
Hemoglobin: 13.3 g/dL (ref 11.7–15.5)
MCH: 30.6 pg (ref 27.0–33.0)
MCHC: 33.5 g/dL (ref 32.0–36.0)
MCV: 91.3 fL (ref 80.0–100.0)
MPV: 9.6 fL (ref 7.5–12.5)
Monocytes Relative: 7.1 %
Neutro Abs: 4183 {cells}/uL (ref 1500–7800)
Neutrophils Relative %: 58.1 %
Platelets: 253 10*3/uL (ref 140–400)
RBC: 4.35 10*6/uL (ref 3.80–5.10)
RDW: 14.1 % (ref 11.0–15.0)
Total Lymphocyte: 32.7 %
WBC: 7.2 10*3/uL (ref 3.8–10.8)

## 2023-11-04 LAB — COMPREHENSIVE METABOLIC PANEL WITH GFR
AG Ratio: 1.7 (calc) (ref 1.0–2.5)
ALT: 9 U/L (ref 6–29)
AST: 13 U/L (ref 10–35)
Albumin: 4.1 g/dL (ref 3.6–5.1)
Alkaline phosphatase (APISO): 88 U/L (ref 37–153)
BUN: 11 mg/dL (ref 7–25)
CO2: 28 mmol/L (ref 20–32)
Calcium: 9.5 mg/dL (ref 8.6–10.4)
Chloride: 102 mmol/L (ref 98–110)
Creat: 0.84 mg/dL (ref 0.50–1.03)
Globulin: 2.4 g/dL (ref 1.9–3.7)
Glucose, Bld: 95 mg/dL (ref 65–99)
Potassium: 4.6 mmol/L (ref 3.5–5.3)
Sodium: 139 mmol/L (ref 135–146)
Total Bilirubin: 0.5 mg/dL (ref 0.2–1.2)
Total Protein: 6.5 g/dL (ref 6.1–8.1)
eGFR: 81 mL/min/{1.73_m2} (ref >=60)
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
## 2023-11-22 ENCOUNTER — Other Ambulatory Visit: Payer: Self-pay | Admitting: Physician Assistant

## 2023-11-22 NOTE — Telephone Encounter (Signed)
 6/10 note shows taking. On 6/19 another provided marked as not taking.  Need to verify.

## 2023-11-27 NOTE — Telephone Encounter (Signed)
 LVM per DPR. Is she still taking?

## 2023-11-28 ENCOUNTER — Ambulatory Visit: Attending: Internal Medicine | Admitting: Internal Medicine

## 2023-11-28 ENCOUNTER — Encounter: Payer: Self-pay | Admitting: Internal Medicine

## 2023-11-28 VITALS — BP 93/67 | HR 69 | Resp 12 | Ht 64.0 in | Wt 174.2 lb

## 2023-11-28 DIAGNOSIS — M7061 Trochanteric bursitis, right hip: Secondary | ICD-10-CM

## 2023-11-28 DIAGNOSIS — M1380 Other specified arthritis, unspecified site: Secondary | ICD-10-CM | POA: Diagnosis not present

## 2023-11-28 DIAGNOSIS — Z79899 Other long term (current) drug therapy: Secondary | ICD-10-CM | POA: Diagnosis not present

## 2023-11-28 DIAGNOSIS — K219 Gastro-esophageal reflux disease without esophagitis: Secondary | ICD-10-CM

## 2023-11-28 DIAGNOSIS — L900 Lichen sclerosus et atrophicus: Secondary | ICD-10-CM

## 2023-11-28 DIAGNOSIS — M7062 Trochanteric bursitis, left hip: Secondary | ICD-10-CM

## 2023-11-28 LAB — CBC WITH DIFFERENTIAL/PLATELET
Absolute Lymphocytes: 2854 {cells}/uL (ref 850–3900)
Absolute Monocytes: 460 {cells}/uL (ref 200–950)
Basophils Absolute: 57 {cells}/uL (ref 0–200)
Basophils Relative: 0.9 %
Eosinophils Absolute: 151 {cells}/uL (ref 15–500)
Eosinophils Relative: 2.4 %
HCT: 40.9 % (ref 35.0–45.0)
Hemoglobin: 13.1 g/dL (ref 11.7–15.5)
MCH: 29.6 pg (ref 27.0–33.0)
MCHC: 32 g/dL (ref 32.0–36.0)
MCV: 92.5 fL (ref 80.0–100.0)
MPV: 10.3 fL (ref 7.5–12.5)
Monocytes Relative: 7.3 %
Neutro Abs: 2778 {cells}/uL (ref 1500–7800)
Neutrophils Relative %: 44.1 %
Platelets: 244 Thousand/uL (ref 140–400)
RBC: 4.42 Million/uL (ref 3.80–5.10)
RDW: 13.3 % (ref 11.0–15.0)
Total Lymphocyte: 45.3 %
WBC: 6.3 Thousand/uL (ref 3.8–10.8)

## 2023-11-28 LAB — COMPREHENSIVE METABOLIC PANEL WITH GFR
AG Ratio: 1.8 (calc) (ref 1.0–2.5)
ALT: 9 U/L (ref 6–29)
AST: 11 U/L (ref 10–35)
Albumin: 4.1 g/dL (ref 3.6–5.1)
Alkaline phosphatase (APISO): 72 U/L (ref 37–153)
BUN: 8 mg/dL (ref 7–25)
CO2: 31 mmol/L (ref 20–32)
Calcium: 9.6 mg/dL (ref 8.6–10.4)
Chloride: 104 mmol/L (ref 98–110)
Creat: 0.79 mg/dL (ref 0.50–1.03)
Globulin: 2.3 g/dL (ref 1.9–3.7)
Glucose, Bld: 94 mg/dL (ref 65–99)
Potassium: 5 mmol/L (ref 3.5–5.3)
Sodium: 140 mmol/L (ref 135–146)
Total Bilirubin: 0.3 mg/dL (ref 0.2–1.2)
Total Protein: 6.4 g/dL (ref 6.1–8.1)
eGFR: 87 mL/min/1.73m2 (ref >=60)

## 2023-11-28 LAB — SEDIMENTATION RATE: Sed Rate: 14 mm/h (ref 0–30)

## 2023-11-28 MED ORDER — METHOTREXATE SODIUM CHEMO INJECTION 50 MG/2ML
20.0000 mg | INTRAMUSCULAR | 0 refills | Status: DC
Start: 2023-11-28 — End: 2024-03-06

## 2023-11-28 MED ORDER — "TUBERCULIN SYRINGE 26G X 3/8"" 1 ML MISC": 0 refills | Status: DC

## 2023-11-28 NOTE — Telephone Encounter (Signed)
Left another VM to RC.  

## 2023-11-28 NOTE — Telephone Encounter (Signed)
 Tried to reach patient again and left VM, but also sent MyChart message.

## 2023-12-01 NOTE — Telephone Encounter (Signed)
 Spoke to pt and she verified she is not taking Vraylar 

## 2023-12-04 ENCOUNTER — Other Ambulatory Visit: Payer: Self-pay | Admitting: Obstetrics & Gynecology

## 2023-12-04 DIAGNOSIS — Z1231 Encounter for screening mammogram for malignant neoplasm of breast: Secondary | ICD-10-CM

## 2023-12-06 ENCOUNTER — Ambulatory Visit (INDEPENDENT_AMBULATORY_CARE_PROVIDER_SITE_OTHER): Admitting: Psychiatry

## 2023-12-06 DIAGNOSIS — F5105 Insomnia due to other mental disorder: Secondary | ICD-10-CM | POA: Diagnosis not present

## 2023-12-06 DIAGNOSIS — F411 Generalized anxiety disorder: Secondary | ICD-10-CM | POA: Diagnosis not present

## 2023-12-06 DIAGNOSIS — Z8659 Personal history of other mental and behavioral disorders: Secondary | ICD-10-CM | POA: Diagnosis not present

## 2023-12-06 DIAGNOSIS — F319 Bipolar disorder, unspecified: Secondary | ICD-10-CM | POA: Diagnosis not present

## 2023-12-06 DIAGNOSIS — Z636 Dependent relative needing care at home: Secondary | ICD-10-CM

## 2023-12-06 DIAGNOSIS — F99 Mental disorder, not otherwise specified: Secondary | ICD-10-CM

## 2023-12-06 DIAGNOSIS — R6889 Other general symptoms and signs: Secondary | ICD-10-CM
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
## 2023-12-15 ENCOUNTER — Ambulatory Visit

## 2023-12-15 ENCOUNTER — Ambulatory Visit
Admission: RE | Admit: 2023-12-15 | Discharge: 2023-12-15 | Disposition: A | Discharge status: Home / Self Care | Source: Ambulatory Visit | Attending: Obstetrics & Gynecology | Admitting: Obstetrics & Gynecology

## 2023-12-15 DIAGNOSIS — Z1231 Encounter for screening mammogram for malignant neoplasm of breast: Secondary | ICD-10-CM

## 2023-12-22 ENCOUNTER — Other Ambulatory Visit: Payer: Self-pay | Admitting: Allergy and Immunology

## 2023-12-26 ENCOUNTER — Other Ambulatory Visit: Payer: Self-pay | Admitting: Physician Assistant

## 2023-12-27 ENCOUNTER — Ambulatory Visit: Admitting: Psychiatry

## 2024-01-19 ENCOUNTER — Other Ambulatory Visit: Payer: Self-pay | Admitting: Allergy and Immunology

## 2024-01-25 ENCOUNTER — Ambulatory Visit: Admitting: Psychiatry

## 2024-02-01 ENCOUNTER — Telehealth: Payer: Self-pay | Admitting: Pharmacy Technician

## 2024-02-01 NOTE — Telephone Encounter (Signed)
   Pharmacy Patient Advocate Encounter   Received notification from Onbase that prior authorization for REPATHA  is required/requested.   Insurance verification completed.   The patient is insured through [employer] .   Per test claim: PA required; PA submitted to above mentioned insurance via Latent Key/confirmation #/[id number] Status is pending

## 2024-02-02 NOTE — Telephone Encounter (Signed)
 Pharmacy Patient Advocate Encounter  Received notification from [facility] that Prior Authorization for repatha  has been APPROVED from 02/01/24 to 01/31/25   PA #/Case ID/Reference #: [id number]

## 2024-02-15 ENCOUNTER — Ambulatory Visit: Admitting: Psychiatry

## 2024-02-19 ENCOUNTER — Other Ambulatory Visit: Payer: Self-pay | Admitting: Allergy and Immunology

## 2024-02-21 NOTE — Progress Notes (Signed)
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 creation errors have been sought, but may not always  have been located. Such creation errors do not reflect on  the standard of medical care.

## 2024-02-24 ENCOUNTER — Other Ambulatory Visit: Payer: Self-pay

## 2024-02-24 DIAGNOSIS — Z6830 Body mass index (BMI) 30.0-30.9, adult: Secondary | ICD-10-CM

## 2024-02-26 ENCOUNTER — Other Ambulatory Visit

## 2024-02-27 ENCOUNTER — Other Ambulatory Visit

## 2024-02-28 ENCOUNTER — Ambulatory Visit: Payer: Self-pay

## 2024-02-28 LAB — CBC WITH DIFFERENTIAL/PLATELET
Basophils Absolute: 0.1 x10E3/uL (ref 0.0–0.2)
Basos: 1 %
EOS (ABSOLUTE): 0.1 x10E3/uL (ref 0.0–0.4)
Eos: 2 %
Hematocrit: 42.2 % (ref 34.0–46.6)
Hemoglobin: 14.2 g/dL (ref 11.1–15.9)
Immature Grans (Abs): 0 x10E3/uL (ref 0.0–0.1)
Immature Granulocytes: 0 %
Lymphocytes Absolute: 2.3 x10E3/uL (ref 0.7–3.1)
Lymphs: 35 %
MCH: 30.9 pg (ref 26.6–33.0)
MCHC: 33.6 g/dL (ref 31.5–35.7)
MCV: 92 fL (ref 79–97)
Monocytes Absolute: 0.6 x10E3/uL (ref 0.1–0.9)
Monocytes: 9 %
Neutrophils Absolute: 3.6 x10E3/uL (ref 1.4–7.0)
Neutrophils: 53 %
Platelets: 290 x10E3/uL (ref 150–450)
RBC: 4.59 x10E6/uL (ref 3.77–5.28)
RDW: 14.4 % (ref 11.7–15.4)
WBC: 6.7 x10E3/uL (ref 3.4–10.8)

## 2024-02-28 LAB — LIPID PANEL
Chol/HDL Ratio: 2.8 ratio (ref 0.0–4.4)
Cholesterol, Total: 113 mg/dL (ref 100–199)
HDL: 41 mg/dL (ref >39)
LDL Chol Calc (NIH): 46 mg/dL (ref 0–99)
Triglycerides: 153 mg/dL — ABNORMAL HIGH (ref 0–149)
VLDL Cholesterol Cal: 26 mg/dL (ref 5–40)

## 2024-02-28 LAB — COMPREHENSIVE METABOLIC PANEL WITH GFR
ALT: 9 IU/L (ref 0–32)
AST: 17 IU/L (ref 0–40)
Albumin: 4.9 g/dL (ref 3.8–4.9)
Alkaline Phosphatase: 100 IU/L (ref 49–135)
BUN/Creatinine Ratio: 8 — ABNORMAL LOW (ref 9–23)
BUN: 7 mg/dL (ref 6–24)
Bilirubin Total: 0.8 mg/dL (ref 0.0–1.2)
CO2: 22 mmol/L (ref 20–29)
Calcium: 9.8 mg/dL (ref 8.7–10.2)
Chloride: 99 mmol/L (ref 96–106)
Creatinine, Ser: 0.92 mg/dL (ref 0.57–1.00)
Globulin, Total: 2.3 g/dL (ref 1.5–4.5)
Glucose: 94 mg/dL (ref 70–99)
Potassium: 4.2 mmol/L (ref 3.5–5.2)
Sodium: 140 mmol/L (ref 134–144)
Total Protein: 7.2 g/dL (ref 6.0–8.5)
eGFR: 73 mL/min/1.73 (ref >59)

## 2024-02-28 LAB — TSH: TSH: 1.85 u[IU]/mL (ref 0.450–4.500)

## 2024-02-28 LAB — HEMOGLOBIN A1C
Est. average glucose Bld gHb Est-mCnc: 114 mg/dL
Hgb A1c MFr Bld: 5.6 % (ref 4.8–5.6)

## 2024-02-28 LAB — VITAMIN D 25 HYDROXY (VIT D DEFICIENCY, FRACTURES): Vit D, 25-Hydroxy: 29.7 ng/mL — ABNORMAL LOW (ref 30.0–100.0)

## 2024-03-04 ENCOUNTER — Ambulatory Visit

## 2024-03-06 ENCOUNTER — Telehealth: Payer: Self-pay | Admitting: Internal Medicine

## 2024-03-06 ENCOUNTER — Ambulatory Visit: Attending: Internal Medicine | Admitting: Internal Medicine

## 2024-03-06 ENCOUNTER — Encounter: Payer: Self-pay | Admitting: Internal Medicine

## 2024-03-06 VITALS — BP 94/72 | HR 86 | Temp 97.3°F | Resp 14 | Ht 64.0 in | Wt 155.0 lb

## 2024-03-06 DIAGNOSIS — M1380 Other specified arthritis, unspecified site: Secondary | ICD-10-CM

## 2024-03-06 DIAGNOSIS — M7061 Trochanteric bursitis, right hip: Secondary | ICD-10-CM

## 2024-03-06 DIAGNOSIS — M7062 Trochanteric bursitis, left hip: Secondary | ICD-10-CM

## 2024-03-06 DIAGNOSIS — Z79899 Other long term (current) drug therapy: Secondary | ICD-10-CM

## 2024-03-06 DIAGNOSIS — N3010 Interstitial cystitis (chronic) without hematuria: Secondary | ICD-10-CM | POA: Diagnosis not present

## 2024-03-06 DIAGNOSIS — K219 Gastro-esophageal reflux disease without esophagitis: Secondary | ICD-10-CM

## 2024-03-06 DIAGNOSIS — M75101 Unspecified rotator cuff tear or rupture of right shoulder, not specified as traumatic: Secondary | ICD-10-CM

## 2024-03-06 MED ORDER — METHOTREXATE SODIUM CHEMO INJECTION 50 MG/2ML: 20.0000 mg | INTRAMUSCULAR | 0 refills | Status: DC

## 2024-03-06 NOTE — Telephone Encounter (Signed)
 Pt would like to know if she can take a weight loss drug with her medication Dr. Jeannetta prescribes her. Pt is requesting a call back.

## 2024-03-06 NOTE — Patient Instructions (Signed)
 Ankle Exercises   Toe Extension  1. Sit with involved leg crossed over uninvolved leg. Grasp toes with one hand and bend the toes and ankle upwards as far as possible to stretch the arch and calf muscle. With the other hand, perform deep massage along the arch of your foot. 2. Hold 10 seconds. Repeat for 2-3 minutes. Repeat 2-4 sessions per day.      Standing Calf Stretch  1. Stand placing hands on wall for support. Place your feet pointing straight ahead, with the involved foot in back of the other. The back leg should have a straight knee and front leg a bent knee. Shift forward, keeping back leg heel on the ground, so that you feel a stretch in the calf muscle of the back leg. 2. Hold 45 seconds, 2-3 times. Repeat 4-6 times per day.       Towel Stretch  The towel stretch is effective at reducing morning pain if done before getting out of bed.  1. Sit with involved leg straight out in front of you. Place a towel around your foot and gently pull toward you, feeling a stretch in your calf muscle. 2. Hold 45 seconds, 2-3 times. Repeat 4-6 times per day.      Calf Stretch on a Step  1. Stand with uninvolved foot flat on a step. Place involved ball of foot on the edge of the step. Gently let heel lower on involved leg to feel a stretch in your calf. 2. Hold 45 seconds, 2-3 times. Repeat 4-6 times per day.

## 2024-03-07 ENCOUNTER — Telehealth: Payer: Self-pay | Admitting: Internal Medicine

## 2024-03-07 ENCOUNTER — Ambulatory Visit: Admitting: Psychiatry

## 2024-03-07 NOTE — Telephone Encounter (Signed)
 Pt is calling back about the previous question she had asked for Dr. Jeannetta in last encounter.

## 2024-03-11 ENCOUNTER — Telehealth: Payer: Self-pay | Admitting: Internal Medicine

## 2024-03-11 NOTE — Telephone Encounter (Signed)
 Contacted patient to advise that per Dr. Jeannetta it is okay for her to take the weight loss drug with the methotrexate . Patient verbalized understanding

## 2024-03-11 NOTE — Telephone Encounter (Signed)
 This is patients 3rd call to ask about the weight loss drug. Pt stated she does not know what to do next.

## 2024-03-28 ENCOUNTER — Ambulatory Visit: Admitting: Psychiatry

## 2024-03-28 DIAGNOSIS — R69 Illness, unspecified: Secondary | ICD-10-CM

## 2024-03-28 DIAGNOSIS — F411 Generalized anxiety disorder: Secondary | ICD-10-CM

## 2024-03-28 DIAGNOSIS — F4310 Post-traumatic stress disorder, unspecified: Secondary | ICD-10-CM

## 2024-03-28 DIAGNOSIS — F319 Bipolar disorder, unspecified: Secondary | ICD-10-CM | POA: Diagnosis not present
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
## 2024-03-29 ENCOUNTER — Telehealth: Payer: Self-pay | Admitting: Physician Assistant

## 2024-03-29 ENCOUNTER — Ambulatory Visit: Admitting: Psychiatry

## 2024-03-29 DIAGNOSIS — F4310 Post-traumatic stress disorder, unspecified: Secondary | ICD-10-CM

## 2024-03-29 DIAGNOSIS — G2581 Restless legs syndrome: Secondary | ICD-10-CM

## 2024-03-29 DIAGNOSIS — F319 Bipolar disorder, unspecified: Secondary | ICD-10-CM | POA: Diagnosis not present

## 2024-03-29 DIAGNOSIS — Z636 Dependent relative needing care at home: Secondary | ICD-10-CM

## 2024-03-29 DIAGNOSIS — R69 Illness, unspecified: Secondary | ICD-10-CM

## 2024-03-29 MED ORDER — CARIPRAZINE HCL 1.5 MG PO CAPS: 1.5000 mg | ORAL_CAPSULE | Freq: Every day | ORAL | 0 refills | Status: DC

## 2024-03-29 NOTE — Telephone Encounter (Signed)
 Patient states that she needs refill for Vraylar  1mg . She explained that TH told her to start back on 1mg  and she only has 3mg  tablets left. Needs prescription for 1mg  Vraylar  sent to [facility] [address] [place], [place] Ph: [phone] Appt 11/18

## 2024-03-29 NOTE — Telephone Encounter (Signed)
 Thank you :)

## 2024-03-29 NOTE — Telephone Encounter (Signed)
 Yes, she was seeing Dr. Marijean yesterday and he and I messaged about it.  Yes, please send in the 1.5 mg, #30, 1 daily.  She is supposed to see him again this afternoon. I'll ask him to give her 2 weeks of samples, along w/ the Rx sent.
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
## 2024-03-29 NOTE — Telephone Encounter (Signed)
 Vraylar  1.5 mg, #30, 1 every day Rx sent to requested pharmacy.

## 2024-03-29 NOTE — Telephone Encounter (Signed)
 Pt said you told her to start back on 1.5 mg Vraylar . I didn't see documented, just wanted to verify this is correct before I send in Rx.

## 2024-03-30 ENCOUNTER — Other Ambulatory Visit: Payer: Self-pay | Admitting: Physician Assistant

## 2024-04-02 ENCOUNTER — Ambulatory Visit: Admitting: Physician Assistant

## 2024-04-05 ENCOUNTER — Telehealth: Payer: Self-pay | Admitting: Cardiovascular Disease

## 2024-04-05 DIAGNOSIS — I2510 Atherosclerotic heart disease of native coronary artery without angina pectoris: Secondary | ICD-10-CM

## 2024-04-05 DIAGNOSIS — E782 Mixed hyperlipidemia: Secondary | ICD-10-CM

## 2024-04-05 MED ORDER — REPATHA SURECLICK 140 MG/ML ~~LOC~~ SOAJ
1.0000 mL | SUBCUTANEOUS | 0 refills | Status: DC
Start: 2024-04-05 — End: 2024-04-08

## 2024-04-05 NOTE — Telephone Encounter (Signed)
*  STAT* If patient is at the pharmacy, call can be transferred to refill team.   1. Which medications need to be refilled? (please list name of each medication and dose if known)  Evolocumab  (REPATHA  SURECLICK) 140 MG/ML SOAJ   2. Which pharmacy/location (including street and city if local pharmacy) is medication to be sent to? [facility] - [place], [place] - [address]    3. Do they need a 30 day or 90 day supply?  90 day supply

## 2024-04-08 ENCOUNTER — Other Ambulatory Visit: Payer: Self-pay | Admitting: Physician Assistant

## 2024-04-08 DIAGNOSIS — E782 Mixed hyperlipidemia: Secondary | ICD-10-CM

## 2024-04-08 DIAGNOSIS — I2510 Atherosclerotic heart disease of native coronary artery without angina pectoris: Secondary | ICD-10-CM

## 2024-04-16 ENCOUNTER — Encounter: Payer: Self-pay | Admitting: Physician Assistant

## 2024-04-16 ENCOUNTER — Ambulatory Visit: Admitting: Physician Assistant

## 2024-04-16 DIAGNOSIS — F4310 Post-traumatic stress disorder, unspecified: Secondary | ICD-10-CM

## 2024-04-16 DIAGNOSIS — F411 Generalized anxiety disorder: Secondary | ICD-10-CM | POA: Diagnosis not present

## 2024-04-16 DIAGNOSIS — F3130 Bipolar disorder, current episode depressed, mild or moderate severity, unspecified: Secondary | ICD-10-CM

## 2024-04-16 DIAGNOSIS — Z636 Dependent relative needing care at home: Secondary | ICD-10-CM | POA: Diagnosis not present

## 2024-04-16 DIAGNOSIS — F5105 Insomnia due to other mental disorder: Secondary | ICD-10-CM

## 2024-04-16 DIAGNOSIS — F99 Mental disorder, not otherwise specified: Secondary | ICD-10-CM

## 2024-04-16 MED ORDER — ZALEPLON 10 MG PO CAPS: ORAL_CAPSULE | ORAL | 1 refills | Status: DC

## 2024-04-16 MED ORDER — AUVELITY 45-105 MG PO TBCR: 1.0000 | EXTENDED_RELEASE_TABLET | Freq: Two times a day (BID) | ORAL | 1 refills | Status: AC

## 2024-04-16 MED ORDER — CLONAZEPAM 1 MG PO TABS: 1.0000 mg | ORAL_TABLET | Freq: Three times a day (TID) | ORAL | 5 refills | Status: AC | PRN

## 2024-04-16 MED ORDER — TRAZODONE HCL 100 MG PO TABS: 100.0000 mg | ORAL_TABLET | Freq: Every evening | ORAL | 1 refills | Status: AC | PRN

## 2024-04-16 MED ORDER — CARIPRAZINE HCL 1.5 MG PO CAPS: 1.5000 mg | ORAL_CAPSULE | Freq: Every day | ORAL | 2 refills | Status: DC

## 2024-04-16 NOTE — Progress Notes (Signed)
 [facility]  Patient ID: Erika Mccoy,  MRN: [id number]  PCP: Gayle Saddie FALCON, PA-C  Date of Evaluation: 04/16/2024 Time spent:25 minutes  Chief Complaint:  Chief Complaint   Anxiety; Depression; Insomnia; Follow-up    HISTORY/CURRENT STATUS: HPI   Not doing well.   Her husband's BMT didn't take. He's having it done again in early January.  His son will be the donor.  He will have to have whole body radiation before the treatment.  She's heartbroken.  Has been really stressed.  We restarted Vraylar  about 3 weeks ago and it seems to be helping a little.  Not sleeping well. Trazodone  helps her get to sleep but staying asleep is an issue.  She sometimes takes another pill in the middle of the night, which helps her go back to sleep but she feels groggy the next morning.  With the stressors of going to and from the hospital a lot these past few months to be with her husband she has taken a trazodone  sometimes in the afternoon to help her relax when she gets home.  Her husband is now at home.  She is not quite as stressed.  ADLs and personal hygiene are normal.  Appetite is normal.  She has purposely lost some weight in the past 6 months or so.  She takes the Klonopin  when anxious.  It is helpful.  No mania, delirium, psychosis, suicidal or homicidal thoughts.  Individual Medical History/ Review of Systems: Changes? :No     Past Psychiatric History:    She voluntarily committed herself in 2004 and 3 times since then. For depression. Never attempted suicide.    Past medications for mental health diagnoses include: Prozac didn't work, Effexor , Wellbutrin , Seroquel, Vraylar , Paxil, Zoloft, Cymbalta , Pristiq, Klonopin , Xanax , Trazodone , Ambien  caused night terrors, Lunesta caused night terrors,  Spravato started 10/2021, insurance will not pay for it, Lamictal  didn't help but took it for a long time, Adderall, Lyrica  caused crying   Never had ECT, TMS  Allergies: Ibuprofen ,  Neomycin-bacitracin -polymyxin  [bacitracin -neomycin-polymyxin], Quinolones, Atorvastatin , Bacitracin -polymyxin b, Bactroban  [mupirocin ], Benzalkonium chloride, Cefprozil, Cephalexin , Ciprofloxacin, Crestor  [rosuvastatin ], Mederma, Monistat [miconazole], Neomycin-bacitracin  zn-polymyx, Polyoxyethylene 40 sorbitol  septaoleate [sorbitan], Quinine, Tioconazole, Valtrex  [valacyclovir  hcl], and Adhesive [tape]  Current Medications:  Current Outpatient Medications:    ASPIRIN 81 PO, Take by mouth., Disp: , Rfl:    bupivacaine  (MARCAINE ) 0.5 % injection, Instill 15 mL in bladder daily as needed, Disp: , Rfl:    estradiol  (VIVELLE -DOT) 0.0375 MG/24HR, Place 1 patch onto the skin 2 (two) times a week., Disp: 24 patch, Rfl: 3   Evolocumab  (REPATHA  SURECLICK) 140 MG/ML SOAJ, INJECT 140MG  INTO THE SKIN EVERY 14 DAYS, Disp: 2 mL, Rfl: 2   folic acid  (FOLVITE ) 1 MG tablet, Take 1 tablet by mouth once daily, Disp: 90 tablet, Rfl: 3   HYDROcodone -acetaminophen  (NORCO) 10-325 MG tablet, Take 1 tablet 3 times a day by oral route as needed for pain for 30 days., Disp: , Rfl:    mometasone  (ASMANEX , 60 METERED DOSES,) 220 MCG/ACT inhaler, Inhale 1 puff into the lungs 2 (two) times daily., Disp: 1 each, Rfl: 3   pantoprazole  (PROTONIX ) 40 MG tablet, Take 1 tablet by mouth twice daily, Disp: 60 tablet, Rfl: 0   progesterone  (PROMETRIUM ) 100 MG capsule, Take 1 capsule (100 mg total) by mouth daily., Disp: 90 capsule, Rfl: 3   TUBERCULIN SYR 1CC/26GX3/8 26G X 3/8 1 ML MISC, Use once weekly for methotrexate  injection, Disp: 25 each, Rfl: 0  VITAMIN D  PO, Take by mouth., Disp: , Rfl:    zaleplon (SONATA) 10 MG capsule, 1 po at bedtime prn sleep. May repeat 1 for mid-nocturnal awakening as long as she has 3-4 hours left to sleep, Disp: 60 capsule, Rfl: 1   Albuterol -Budesonide (AIRSUPRA ) 90-80 MCG/ACT AERO, Inhale 2 puffs into the lungs every 4 (four) hours as needed. (Patient not taking: Reported on 03/06/2024), Disp: 10.7  g, Rfl: 2   cariprazine  (VRAYLAR ) 1.5 MG capsule, Take 1 capsule (1.5 mg total) by mouth daily., Disp: 30 capsule, Rfl: 2   Cholecalciferol  100 MCG (4000 UT) CAPS, Take 1 capsule by mouth daily. (Patient not taking: Reported on 03/06/2024), Disp: , Rfl:    clobetasol  ointment (TEMOVATE ) 0.05 %, APPLY SMALL AMOUNT TOPICALLY TO THE AFFECTED AREA 2 TIMES A WEEK (Patient taking differently: as needed. APPLY SMALL AMOUNT TOPICALLY TO THE AFFECTED AREA 2 TIMES A WEEK), Disp: 60 g, Rfl: 0   clonazePAM  (KLONOPIN ) 1 MG tablet, Take 1 tablet (1 mg total) by mouth 3 (three) times daily as needed. for anxiety, Disp: 90 tablet, Rfl: 5   Dextromethorphan -buPROPion  ER (AUVELITY ) 45-105 MG TBCR, Take 1 tablet by mouth 2 (two) times daily., Disp: 180 tablet, Rfl: 1   ELMIRON  100 MG capsule, Take 100 mg by mouth in the morning and at bedtime. Takes per bladder (Patient taking differently: Take 100 mg by mouth as needed. Takes per bladder), Disp: , Rfl:    famotidine  (PEPCID ) 40 MG tablet, Take 1 tablet (40 mg total) by mouth at bedtime. (Patient not taking: Reported on 03/06/2024), Disp: 30 tablet, Rfl: 0   fluconazole  (DIFLUCAN ) 150 MG tablet, Take 1 tablet orally every 72 hours for 3 doses. (Patient not taking: Reported on 03/06/2024), Disp: 1 tablet, Rfl: 0   fluticasone -salmeterol (ADVAIR HFA) 115-21 MCG/ACT inhaler, INHALE 2 PUFFS INTO THE LUNGS TWICE DAILY (Patient not taking: Reported on 03/06/2024), Disp: 12 g, Rfl: 2   hydrOXYzine  (ATARAX /VISTARIL ) 25 MG tablet, Take 50 mg by mouth at bedtime.  (Patient not taking: Reported on 03/06/2024), Disp: , Rfl:    levalbuterol  (XOPENEX  HFA) 45 MCG/ACT inhaler, Inhale 2 puffs into the lungs every 6 (six) hours as needed for wheezing. (Patient not taking: Reported on 03/06/2024), Disp: 15 g, Rfl: 0   loratadine  (CLARITIN ) 10 MG tablet, Take 1 tablet (10 mg total) by mouth daily. (Patient taking differently: Take 10 mg by mouth as needed.), Disp: 30 tablet, Rfl: 0    methotrexate  50 MG/2ML injection, Inject 0.8 mLs (20 mg total) into the skin once a week., Disp: 10 mL, Rfl: 0   Multiple Vitamins-Minerals (MULTIVITAMIN WITH MINERALS) tablet, Take 1 tablet by mouth daily. (Patient not taking: Reported on 03/06/2024), Disp: , Rfl:    nitrofurantoin  (MACRODANTIN ) 50 MG capsule, Take 50 mg by mouth daily., Disp: , Rfl:    Olopatadine-Mometasone  (RYALTRIS ) 665-25 MCG/ACT SUSP, Place 2 sprays into both nostrils in the morning and at bedtime. (Patient not taking: Reported on 11/28/2023), Disp: 29 g, Rfl: 0   traZODone  (DESYREL ) 100 MG tablet, Take 1-3 tablets (100-300 mg total) by mouth at bedtime as needed for sleep., Disp: 270 tablet, Rfl: 1 Medication Side Effects: sexual dysfunction  Family Medical/ Social History: Changes?  No  MENTAL HEALTH EXAM:  There were no vitals taken for this visit.There is no height or weight on file to calculate BMI.  General Appearance: Casual and Well Groomed  Eye Contact:  Good  Speech:  Clear and Coherent and Normal Rate  Volume:  Normal  Mood:  Sad  Affect:  Tearful  Thought Process:  Goal Directed and Descriptions of Associations: Circumstantial  Orientation:  Full (Time, Place, and Person)  Thought Content: Logical   Suicidal Thoughts:  No  Homicidal Thoughts:  No  Memory:  WNL  Judgement:  Good  Insight:  Good  Psychomotor Activity: Normal  Concentration:  Concentration: Good and Attention Span: Good  Recall:  Good  Fund of Knowledge: Good  Language: Good  Assets:  Communication Skills Desire for Improvement Financial Resources/Insurance Housing Resilience Transportation  ADL's:  Intact  Cognition: WNL  Prognosis:  Good   Cardiology and PCP follows labs.   DIAGNOSES:    ICD-10-CM   1. Bipolar I disorder, most recent episode depressed (HCC)  F31.30     2. Generalized anxiety disorder (hx longterm PTSD)  F41.1     3. Caregiver stress  Z63.6     4. PTSD (post-traumatic stress disorder) - multiple  traumas, stable  F43.10     5. Insomnia due to other mental disorder  F51.05    F99       Receiving Psychotherapy: Yes   with Dr. Jodie Kendall  RECOMMENDATIONS:  PDMP reviewed.  Klonopin  filled 04/03/2024.  Hydrocodone  04/01/2024.  I provided approximately 25 minutes of face to face time during this encounter, including time spent before and after the visit in records review, medical decision making, counseling pertinent to today's visit, and charting.   I am sorry to hear about her husband's condition but there is hope in having a relative donor bone marrow transplant.  Encouragement given.  Sleep hygiene discussed.  Recommend adding Sonata .  Benefits, risk and side effects were discussed and she would like to try it.  Continue Vraylar  1.5 mg daily.  Continue Klonopin  1 mg, 1 po tid prn.   Continue Auvelity  45-105 mg bid. Cont Hydroxyzine  25 mg, 2 po qhs.  Continue Trazodone  100 mg,  ok to take 1 in afternoon, and up to 2 at hs.  May not need it though b/c of the Sonata . Start Sonata  10 mg, 1 p.o. nightly as needed sleep and may repeat 1 for mid nocturnal awakening as long as she has 3 to 4 hours left to sleep. Continue therapy with Dr. Jodie Kendall. Return in 4-6 weeks.            Verneita Cooks, PA-C

## 2024-04-18 ENCOUNTER — Ambulatory Visit: Admitting: Psychiatry

## 2024-05-06 ENCOUNTER — Ambulatory Visit (INDEPENDENT_AMBULATORY_CARE_PROVIDER_SITE_OTHER): Admitting: Psychiatry

## 2024-05-06 DIAGNOSIS — Z636 Dependent relative needing care at home: Secondary | ICD-10-CM

## 2024-05-06 DIAGNOSIS — F411 Generalized anxiety disorder: Secondary | ICD-10-CM | POA: Diagnosis not present

## 2024-05-06 DIAGNOSIS — F3130 Bipolar disorder, current episode depressed, mild or moderate severity, unspecified: Secondary | ICD-10-CM
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
## 2024-05-12 ENCOUNTER — Telehealth: Payer: Self-pay

## 2024-05-12 NOTE — Telephone Encounter (Signed)
 [place] approved Auvelity  45-105 mg #60/30 day effective through 05/10/25 with [employer]

## 2024-05-23 ENCOUNTER — Encounter: Payer: Self-pay | Admitting: Physician Assistant

## 2024-05-23 ENCOUNTER — Ambulatory Visit: Admitting: Physician Assistant

## 2024-05-23 DIAGNOSIS — F99 Mental disorder, not otherwise specified: Secondary | ICD-10-CM

## 2024-05-23 DIAGNOSIS — F411 Generalized anxiety disorder: Secondary | ICD-10-CM

## 2024-05-23 DIAGNOSIS — Z636 Dependent relative needing care at home: Secondary | ICD-10-CM | POA: Diagnosis not present

## 2024-05-23 DIAGNOSIS — G2581 Restless legs syndrome: Secondary | ICD-10-CM | POA: Diagnosis not present

## 2024-05-23 DIAGNOSIS — F3130 Bipolar disorder, current episode depressed, mild or moderate severity, unspecified: Secondary | ICD-10-CM | POA: Diagnosis not present

## 2024-05-23 DIAGNOSIS — F5105 Insomnia due to other mental disorder: Secondary | ICD-10-CM

## 2024-05-23 MED ORDER — ZALEPLON 10 MG PO CAPS: ORAL_CAPSULE | ORAL | 5 refills | Status: AC

## 2024-05-23 MED ORDER — CARIPRAZINE HCL 1.5 MG PO CAPS: 3.0000 mg | ORAL_CAPSULE | Freq: Every day | ORAL | Status: AC

## 2024-05-23 NOTE — Progress Notes (Signed)
 "     [facility]  Patient ID: Erika Mccoy,  MRN: [id number]  PCP: Gayle Saddie FALCON, PA-C  Date of Evaluation: 05/23/2024 Time spent:25 minutes  Chief Complaint:  Chief Complaint   Anxiety; Insomnia; Depression; Follow-up    HISTORY/CURRENT STATUS: HPI   For 6 week med check  She's very tired all the time. Hates cold weather. Hurts all over.  Dtr preg again and she doesn't even care. It should be a happy time. No enjoyment. Decreased hygiene.  Appetite is fair. Not working. Anxiety control is fair.  Meds help but don't completely get rid of it.  Very concerned about her husband who goes to hosp 1/14, whole body XRT then 2nd stem cell transplant. No mania, psychosis, delirium, SI/Hi.  Individual Medical History/ Review of Systems: Changes? :No     Past Psychiatric History:    She voluntarily committed herself in 2004 and 3 times since then. For depression. Never attempted suicide.    Past medications for mental health diagnoses include: Prozac didn't work, Effexor , Wellbutrin , Seroquel, Vraylar , Paxil, Zoloft, Cymbalta , Pristiq, Klonopin , Xanax , Trazodone , Ambien  caused night terrors, Lunesta caused night terrors,  Spravato started 10/2021, insurance will not pay for it, Lamictal  didn't help but took it for a long time, Adderall, Lyrica  caused crying   Never had ECT, TMS  Allergies: Ibuprofen , Neomycin-bacitracin -polymyxin  [bacitracin -neomycin-polymyxin], Quinolones, Atorvastatin , Bacitracin -polymyxin b, Bactroban  [mupirocin ], Benzalkonium chloride, Cefprozil, Cephalexin , Ciprofloxacin, Crestor  [rosuvastatin ], Mederma, Monistat [miconazole], Neomycin-bacitracin  zn-polymyx, Polyoxyethylene 40 sorbitol  septaoleate [sorbitan], Quinine, Tioconazole, Valtrex  [valacyclovir  hcl], and Adhesive [tape]  Current Medications:  Current Outpatient Medications:    ASPIRIN 81 PO, Take by mouth., Disp: , Rfl:    bupivacaine  (MARCAINE ) 0.5 % injection, Instill 15 mL in bladder daily as needed,  Disp: , Rfl:    clonazePAM  (KLONOPIN ) 1 MG tablet, Take 1 tablet (1 mg total) by mouth 3 (three) times daily as needed. for anxiety, Disp: 90 tablet, Rfl: 5   Dextromethorphan -buPROPion  ER (AUVELITY ) 45-105 MG TBCR, Take 1 tablet by mouth 2 (two) times daily., Disp: 180 tablet, Rfl: 1   estradiol  (VIVELLE -DOT) 0.0375 MG/24HR, Place 1 patch onto the skin 2 (two) times a week., Disp: 24 patch, Rfl: 3   Evolocumab  (REPATHA  SURECLICK) 140 MG/ML SOAJ, INJECT 140MG  INTO THE SKIN EVERY 14 DAYS, Disp: 2 mL, Rfl: 2   folic acid  (FOLVITE ) 1 MG tablet, Take 1 tablet by mouth once daily, Disp: 90 tablet, Rfl: 3   mometasone  (ASMANEX , 60 METERED DOSES,) 220 MCG/ACT inhaler, Inhale 1 puff into the lungs 2 (two) times daily., Disp: 1 each, Rfl: 3   nitrofurantoin  (MACRODANTIN ) 50 MG capsule, Take 50 mg by mouth daily., Disp: , Rfl:    pantoprazole  (PROTONIX ) 40 MG tablet, Take 1 tablet by mouth twice daily, Disp: 60 tablet, Rfl: 0   progesterone  (PROMETRIUM ) 100 MG capsule, Take 1 capsule (100 mg total) by mouth daily., Disp: 90 capsule, Rfl: 3   traZODone  (DESYREL ) 100 MG tablet, Take 1-3 tablets (100-300 mg total) by mouth at bedtime as needed for sleep., Disp: 270 tablet, Rfl: 1   VITAMIN D  PO, Take by mouth., Disp: , Rfl:    Albuterol -Budesonide (AIRSUPRA ) 90-80 MCG/ACT AERO, Inhale 2 puffs into the lungs every 4 (four) hours as needed. (Patient not taking: Reported on 03/06/2024), Disp: 10.7 g, Rfl: 2   cariprazine  (VRAYLAR ) 1.5 MG capsule, Take 2 capsules (3 mg total) by mouth daily., Disp: , Rfl:    Cholecalciferol  100 MCG (4000 UT) CAPS, Take 1 capsule by  mouth daily. (Patient not taking: Reported on 03/06/2024), Disp: , Rfl:    clobetasol  ointment (TEMOVATE ) 0.05 %, APPLY SMALL AMOUNT TOPICALLY TO THE AFFECTED AREA 2 TIMES A WEEK (Patient taking differently: as needed. APPLY SMALL AMOUNT TOPICALLY TO THE AFFECTED AREA 2 TIMES A WEEK), Disp: 60 g, Rfl: 0   ELMIRON  100 MG capsule, Take 100 mg by mouth in the  morning and at bedtime. Takes per bladder (Patient taking differently: Take 100 mg by mouth as needed. Takes per bladder), Disp: , Rfl:    famotidine  (PEPCID ) 40 MG tablet, Take 1 tablet (40 mg total) by mouth at bedtime. (Patient not taking: Reported on 03/06/2024), Disp: 30 tablet, Rfl: 0   fluconazole  (DIFLUCAN ) 150 MG tablet, Take 1 tablet orally every 72 hours for 3 doses. (Patient not taking: Reported on 03/06/2024), Disp: 1 tablet, Rfl: 0   fluticasone -salmeterol (ADVAIR HFA) 115-21 MCG/ACT inhaler, INHALE 2 PUFFS INTO THE LUNGS TWICE DAILY (Patient not taking: Reported on 03/06/2024), Disp: 12 g, Rfl: 2   HYDROcodone -acetaminophen  (NORCO) 10-325 MG tablet, Take 1 tablet 3 times a day by oral route as needed for pain for 30 days., Disp: , Rfl:    hydrOXYzine  (ATARAX /VISTARIL ) 25 MG tablet, Take 50 mg by mouth at bedtime.  (Patient not taking: Reported on 03/06/2024), Disp: , Rfl:    levalbuterol  (XOPENEX  HFA) 45 MCG/ACT inhaler, Inhale 2 puffs into the lungs every 6 (six) hours as needed for wheezing. (Patient not taking: Reported on 03/06/2024), Disp: 15 g, Rfl: 0   loratadine  (CLARITIN ) 10 MG tablet, Take 1 tablet (10 mg total) by mouth daily. (Patient taking differently: Take 10 mg by mouth as needed.), Disp: 30 tablet, Rfl: 0   methotrexate  50 MG/2ML injection, Inject 0.8 mLs (20 mg total) into the skin once a week., Disp: 10 mL, Rfl: 0   Multiple Vitamins-Minerals (MULTIVITAMIN WITH MINERALS) tablet, Take 1 tablet by mouth daily. (Patient not taking: Reported on 03/06/2024), Disp: , Rfl:    Olopatadine-Mometasone  (RYALTRIS ) 665-25 MCG/ACT SUSP, Place 2 sprays into both nostrils in the morning and at bedtime. (Patient not taking: Reported on 11/28/2023), Disp: 29 g, Rfl: 0   TUBERCULIN SYR 1CC/26GX3/8 26G X 3/8 1 ML MISC, Use once weekly for methotrexate  injection, Disp: 25 each, Rfl: 0   zaleplon  (SONATA ) 10 MG capsule, 1 po at bedtime prn sleep. May repeat 1 for mid-nocturnal awakening as  long as she has 3-4 hours left to sleep, Disp: 60 capsule, Rfl: 5 Medication Side Effects: sexual dysfunction  Family Medical/ Social History: Changes?  No  MENTAL HEALTH EXAM:  There were no vitals taken for this visit.There is no height or weight on file to calculate BMI.  General Appearance: Casual and Well Groomed  Eye Contact:  Good  Speech:  Clear and Coherent and Normal Rate  Volume:  Normal  Mood:  Depressed  Affect:  Depressed  Thought Process:  Goal Directed and Descriptions of Associations: Circumstantial  Orientation:  Full (Time, Place, and Person)  Thought Content: Logical   Suicidal Thoughts:  No  Homicidal Thoughts:  No  Memory:  WNL  Judgement:  Good  Insight:  Good  Psychomotor Activity: Normal  Concentration:  Concentration: Good and Attention Span: Good  Recall:  Good  Fund of Knowledge: Good  Language: Good  Assets:  Communication Skills Desire for Improvement Financial Resources/Insurance Housing Resilience Transportation  ADL's:  Intact  Cognition: WNL  Prognosis:  Good   Cardiology and PCP follows labs.   DIAGNOSES:  ICD-10-CM   1. Bipolar I disorder, most recent episode depressed (HCC)  F31.30     2. Generalized anxiety disorder (hx longterm PTSD)  F41.1     3. Insomnia due to other mental disorder  F51.05    F99     4. Restless leg syndrome  G25.81     5. Caregiver stress  Z63.6       Receiving Psychotherapy: Yes   with Dr. Jodie Kendall  RECOMMENDATIONS:  PDMP reviewed.  Klonopin  filled 05/03/2024.  Sonata  filled 04/16/2024. I provided approximately  25  minutes of face to face time during this encounter, including time spent before and after the visit in records review, medical decision making, counseling pertinent to today's visit, and charting.   Well wishes on her husband's upcoming stem cell transplant.   Discussed tx options. Recommend increasing Vraylar . Will help depression and mood regulation. She agrees.   Increase  Vraylar  to 3 mg daily.  Continue Klonopin  1 mg, 1 po tid prn.   Continue Auvelity  45-105 mg bid. Cont Hydroxyzine  25 mg, 2 po qhs.  Continue Trazodone  100 mg,  ok to take 1 in afternoon, and up to 2 at hs.  May not need it though b/c of the Sonata . Continue Sonata  10 mg, 1 p.o. nightly as needed sleep and may repeat 1 for mid nocturnal awakening as long as she has 3 to 4 hours left to sleep. Continue therapy with Dr. Jodie Kendall. Return in 6 weeks.            Verneita Cooks, PA-C  "

## 2024-05-28 NOTE — Assessment & Plan Note (Signed)
 SABRA

## 2024-05-28 NOTE — Assessment & Plan Note (Signed)
 Erika Mccoy
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
## 2024-06-04 ENCOUNTER — Ambulatory Visit: Admitting: Allergy and Immunology

## 2024-06-06 ENCOUNTER — Ambulatory Visit: Admitting: Psychiatry

## 2024-06-06 DIAGNOSIS — F319 Bipolar disorder, unspecified: Secondary | ICD-10-CM | POA: Diagnosis not present

## 2024-06-06 DIAGNOSIS — Z638 Other specified problems related to primary support group: Secondary | ICD-10-CM | POA: Diagnosis not present

## 2024-06-06 DIAGNOSIS — F411 Generalized anxiety disorder: Secondary | ICD-10-CM

## 2024-06-06 DIAGNOSIS — Z636 Dependent relative needing care at home: Secondary | ICD-10-CM
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
## 2024-06-10 ENCOUNTER — Ambulatory Visit: Admitting: Internal Medicine

## 2024-06-10 DIAGNOSIS — Z79899 Other long term (current) drug therapy: Secondary | ICD-10-CM

## 2024-06-10 DIAGNOSIS — M75101 Unspecified rotator cuff tear or rupture of right shoulder, not specified as traumatic: Secondary | ICD-10-CM

## 2024-06-10 DIAGNOSIS — M7061 Trochanteric bursitis, right hip: Secondary | ICD-10-CM

## 2024-06-10 DIAGNOSIS — M1380 Other specified arthritis, unspecified site: Secondary | ICD-10-CM

## 2024-06-10 DIAGNOSIS — K219 Gastro-esophageal reflux disease without esophagitis: Secondary | ICD-10-CM

## 2024-06-13 ENCOUNTER — Telehealth: Payer: Self-pay

## 2024-06-13 ENCOUNTER — Ambulatory Visit: Attending: Internal Medicine | Admitting: Internal Medicine

## 2024-06-13 ENCOUNTER — Encounter: Payer: Self-pay | Admitting: Internal Medicine

## 2024-06-13 VITALS — BP 128/80 | HR 97 | Temp 97.2°F | Resp 16 | Ht 64.0 in | Wt 154.8 lb

## 2024-06-13 DIAGNOSIS — M1380 Other specified arthritis, unspecified site: Secondary | ICD-10-CM

## 2024-06-13 DIAGNOSIS — E507 Other ocular manifestations of vitamin A deficiency: Secondary | ICD-10-CM | POA: Diagnosis not present

## 2024-06-13 DIAGNOSIS — Z79899 Other long term (current) drug therapy: Secondary | ICD-10-CM

## 2024-06-13 DIAGNOSIS — M797 Fibromyalgia: Secondary | ICD-10-CM | POA: Diagnosis not present

## 2024-06-13 MED ORDER — CYCLOSPORINE 0.05 % OP EMUL: 1.0000 [drp] | Freq: Two times a day (BID) | OPHTHALMIC | 5 refills | Status: AC

## 2024-06-13 MED ORDER — METHOTREXATE SODIUM CHEMO INJECTION 50 MG/2ML: 20.0000 mg | INTRAMUSCULAR | 0 refills | Status: AC

## 2024-06-13 MED ORDER — "TUBERCULIN SYRINGE 26G X 3/8"" 1 ML MISC": 0 refills | Status: AC

## 2024-06-13 NOTE — Patient Instructions (Signed)
 I recommend symptom treatments for eye dryness including lubricating eye drops and can use gel or ointment based products for overnight.  Also consider use of humidifier at night during dry weather.  Use follow-up regularly with your eye doctor.  If symptoms worsen there are several types of medicated eyedrop that can help with the dryness or inflammation.  For chronic dry mouth is important to stay well-hydrated.  You can also use sugar-free gum or lozenges to stimulate the saliva production.  Biotene mouthwash or lozenges may also be helpful.  Products into XyliMelts also work to stimulate saliva production.  Continue following up with your dentist regularly because dryness problems can increase the risk of tooth and gum decay.  If symptoms get worse there are medications for stimulating tear and saliva production but will try all the other options first.

## 2024-06-13 NOTE — Assessment & Plan Note (Addendum)
 No serious interval infections. - Checking CBC and CMP for medication monitoring on methotrexate  Orders:   CBC with Differential/Platelet   Comprehensive metabolic panel with GFR

## 2024-06-13 NOTE — Assessment & Plan Note (Addendum)
 Managed with methotrexate  injections. Symptoms are controlled. Concerns about methotrexate  affecting wound healing for upcoming cosmetic surgery. - Continue methotrexate  20 mg [place] weekly - Continue folic acid  1 mg daily - Discuss methotrexate  discontinuation with plastic surgeon before surgery- would recommend 2 weeks prior and 4 weeks following unless as direct with surgical office. - Consider steroid taper if arthritis flare occurs post-surgery. Orders:   methotrexate  50 MG/2ML injection; Inject 0.8 mLs (20 mg total) into the skin once a week.   TUBERCULIN SYR 1CC/26GX3/8 26G X 3/8 1 ML MISC; Use once weekly for methotrexate  injection   Sedimentation rate

## 2024-06-13 NOTE — Telephone Encounter (Signed)
Pt LVM and is requesting a call back.

## 2024-06-13 NOTE — Assessment & Plan Note (Addendum)
 Recent flare-up with burning and aching sensation. Stress identified as a trigger. Exacerbation of IC consistent with FMS exacerbation.

## 2024-06-13 NOTE — Telephone Encounter (Signed)
 Attempted to contact patient and left message on machine to advise patient to call the office regarding a prior authorization for a medication.

## 2024-06-13 NOTE — Progress Notes (Unsigned)
" °  OFFICE NOTE:    Date:  06/13/2024  ID:  Erika Mccoy, DOB 05-04-1966, MRN [id number] PCP: Gayle Saddie Erika Mccoy  [facility] Providers Cardiologist:  Erika Cash, MD { Click to update primary MD,subspecialty MD or APP then REFRESH:1}      *** Coronary artery disease CCTA 06/03/21: CAC score 0, pLAD 25-49 (noncalcified) TTE 06/08/21: EF 58, no RWMA, NL RVSF, trivial AI, RAP 3 Hyperlipidemia  Intolerant to statins >> Zetia , PCSK9 inhibitor Palpitations  Monitor 04/2022: NSR Seronegative inflammatory arthritis  Anxiety/Depression Fibromyalgia Gastritis  IBS PTSD         Discussed the use of AI scribe software for clinical note transcription with the patient, who gave verbal consent to proceed. History of Present Illness Erika Mccoy is a 59 y.o. female for follow up of CAD. Last seen in 03/2023.     ROS-See HPI***    Studies Reviewed:      LABS 02/27/2024: K 4.2, creatinine 0.92, ALT 9, total cholesterol 113, HDL 41, triglycerides 153, LDL 46, Hgb 14.2, PLT 290K, TSH 1.85 Results  Risk Assessment/Calculations: {Does this patient have ATRIAL FIBRILLATION?[phone]}        Physical Exam:  VS:  There were no vitals taken for this visit.       Wt Readings from Last 3 Encounters:  06/13/24 154 lb 12 oz (70.2 kg)  03/06/24 155 lb (70.3 kg)  11/28/23 174 lb 3.2 oz (79 kg)    Physical Exam***     Assessment and Plan:    Assessment & Plan Coronary artery disease involving native coronary artery of native heart without angina pectoris  Mixed hyperlipidemia  Assessment and Plan Assessment & Plan    {      :1}    {Are you ordering a CV Procedure (e.g. stress test, cath, DCCV, TEE, etc)?   Press F2        :[id number]}  Dispo:  No follow-ups on file.  Signed, Glendia Ferrier, PA-C   "

## 2024-06-13 NOTE — Telephone Encounter (Addendum)
 Started a Prior Authorization request to [facility] for Restasis  0.05% Emulsion via CoverMyMeds. Will update once we receive a response.  Attempted to contact patient to see if she has tried an failed other medications such as Xiidra, Vevye , Verkazia , Cequa , or Alrex. Unable to leave message as it kept hanging up while in the middle of the voicemail

## 2024-06-14 ENCOUNTER — Ambulatory Visit: Admitting: Physician Assistant

## 2024-06-14 DIAGNOSIS — E782 Mixed hyperlipidemia: Secondary | ICD-10-CM

## 2024-06-14 DIAGNOSIS — I2510 Atherosclerotic heart disease of native coronary artery without angina pectoris: Secondary | ICD-10-CM

## 2024-06-14 LAB — COMPREHENSIVE METABOLIC PANEL WITH GFR
AG Ratio: 2.1 (calc) (ref 1.0–2.5)
ALT: 9 U/L (ref 6–29)
AST: 10 U/L (ref 10–35)
Albumin: 4.4 g/dL (ref 3.6–5.1)
Alkaline phosphatase (APISO): 72 U/L (ref 37–153)
BUN: 10 mg/dL (ref 7–25)
CO2: 31 mmol/L (ref 20–32)
Calcium: 9.5 mg/dL (ref 8.6–10.4)
Chloride: 103 mmol/L (ref 98–110)
Creat: 0.78 mg/dL (ref 0.50–1.03)
Globulin: 2.1 g/dL (ref 1.9–3.7)
Glucose, Bld: 91 mg/dL (ref 65–99)
Potassium: 4.3 mmol/L (ref 3.5–5.3)
Sodium: 140 mmol/L (ref 135–146)
Total Bilirubin: 0.4 mg/dL (ref 0.2–1.2)
Total Protein: 6.5 g/dL (ref 6.1–8.1)
eGFR: 89 mL/min/{1.73_m2}

## 2024-06-14 LAB — CBC WITH DIFFERENTIAL/PLATELET
Absolute Lymphocytes: 1728 {cells}/uL (ref 850–3900)
Absolute Monocytes: 372 {cells}/uL (ref 200–950)
Basophils Absolute: 48 {cells}/uL (ref 0–200)
Basophils Relative: 0.8 %
Eosinophils Absolute: 168 {cells}/uL (ref 15–500)
Eosinophils Relative: 2.8 %
HCT: 37.6 % (ref 35.9–46.0)
Hemoglobin: 12.6 g/dL (ref 11.7–15.5)
MCH: 32 pg (ref 27.0–33.0)
MCHC: 33.5 g/dL (ref 31.6–35.4)
MCV: 95.4 fL (ref 81.4–101.7)
MPV: 10.2 fL (ref 7.5–12.5)
Monocytes Relative: 6.2 %
Neutro Abs: 3684 {cells}/uL (ref 1500–7800)
Neutrophils Relative %: 61.4 %
Platelets: 239 10*3/uL (ref 140–400)
RBC: 3.94 Million/uL (ref 3.80–5.10)
RDW: 14.6 % (ref 11.0–15.0)
Total Lymphocyte: 28.8 %
WBC: 6 10*3/uL (ref 3.8–10.8)

## 2024-06-14 LAB — SEDIMENTATION RATE: Sed Rate: 14 mm/h (ref 0–30)

## 2024-06-16 ENCOUNTER — Ambulatory Visit: Payer: Self-pay | Admitting: Internal Medicine

## 2024-06-17 NOTE — Telephone Encounter (Signed)
 Spoke with patient she states she received a message via my chart of the list of medications to try. Patient states she will try them to see if they work.

## 2024-06-20 ENCOUNTER — Ambulatory Visit: Admitting: Psychiatry

## 2024-07-04 ENCOUNTER — Ambulatory Visit: Admitting: Physician Assistant

## 2024-09-03 ENCOUNTER — Ambulatory Visit: Admitting: Physician Assistant

## 2024-09-10 ENCOUNTER — Ambulatory Visit: Admitting: Internal Medicine
